# Patient Record
Sex: Male | Born: 1945 | Race: White | Hispanic: No | Marital: Married | Smoking: Never smoker
Health system: Southern US, Community
[De-identification: ages and names within clinical notes are randomized; demographics above are authoritative.]

## PROBLEM LIST (undated history)

## (undated) DIAGNOSIS — C439 Malignant melanoma of skin, unspecified: Secondary | ICD-10-CM

## (undated) DIAGNOSIS — K5090 Crohn's disease, unspecified, without complications: Secondary | ICD-10-CM

## (undated) DIAGNOSIS — E039 Hypothyroidism, unspecified: Secondary | ICD-10-CM

## (undated) DIAGNOSIS — E034 Atrophy of thyroid (acquired): Secondary | ICD-10-CM

## (undated) DIAGNOSIS — M19041 Primary osteoarthritis, right hand: Secondary | ICD-10-CM

## (undated) DIAGNOSIS — Z7960 Long term (current) use of unspecified immunomodulators and immunosuppressants: Secondary | ICD-10-CM

## (undated) DIAGNOSIS — N419 Inflammatory disease of prostate, unspecified: Secondary | ICD-10-CM

## (undated) DIAGNOSIS — E1165 Type 2 diabetes mellitus with hyperglycemia: Secondary | ICD-10-CM

## (undated) DIAGNOSIS — E559 Vitamin D deficiency, unspecified: Secondary | ICD-10-CM

## (undated) DIAGNOSIS — D649 Anemia, unspecified: Secondary | ICD-10-CM

## (undated) DIAGNOSIS — M1990 Unspecified osteoarthritis, unspecified site: Secondary | ICD-10-CM

## (undated) DIAGNOSIS — Z79899 Other long term (current) drug therapy: Secondary | ICD-10-CM

## (undated) DIAGNOSIS — Z7902 Long term (current) use of antithrombotics/antiplatelets: Secondary | ICD-10-CM

## (undated) DIAGNOSIS — N529 Male erectile dysfunction, unspecified: Secondary | ICD-10-CM

## (undated) DIAGNOSIS — C801 Malignant (primary) neoplasm, unspecified: Secondary | ICD-10-CM

## (undated) DIAGNOSIS — K5100 Ulcerative (chronic) pancolitis without complications: Secondary | ICD-10-CM

## (undated) DIAGNOSIS — I509 Heart failure, unspecified: Secondary | ICD-10-CM

## (undated) DIAGNOSIS — E785 Hyperlipidemia, unspecified: Secondary | ICD-10-CM

## (undated) DIAGNOSIS — I219 Acute myocardial infarction, unspecified: Secondary | ICD-10-CM

## (undated) DIAGNOSIS — M19042 Primary osteoarthritis, left hand: Secondary | ICD-10-CM

## (undated) DIAGNOSIS — I471 Supraventricular tachycardia, unspecified: Secondary | ICD-10-CM

## (undated) DIAGNOSIS — I2510 Atherosclerotic heart disease of native coronary artery without angina pectoris: Secondary | ICD-10-CM

## (undated) DIAGNOSIS — I252 Old myocardial infarction: Secondary | ICD-10-CM

## (undated) DIAGNOSIS — K219 Gastro-esophageal reflux disease without esophagitis: Secondary | ICD-10-CM

## (undated) DIAGNOSIS — Z8619 Personal history of other infectious and parasitic diseases: Secondary | ICD-10-CM

## (undated) DIAGNOSIS — I5189 Other ill-defined heart diseases: Secondary | ICD-10-CM

## (undated) DIAGNOSIS — C4492 Squamous cell carcinoma of skin, unspecified: Secondary | ICD-10-CM

## (undated) DIAGNOSIS — Z955 Presence of coronary angioplasty implant and graft: Secondary | ICD-10-CM

## (undated) HISTORY — DX: Type 2 diabetes mellitus with hyperglycemia: E11.65

## (undated) HISTORY — DX: Anemia, unspecified: D64.9

## (undated) HISTORY — DX: Ulcerative (chronic) pancolitis without complications: K51.00

## (undated) HISTORY — PX: BACK SURGERY: SHX140

## (undated) HISTORY — PX: CARDIAC CATHETERIZATION: SHX172

## (undated) HISTORY — DX: Gastro-esophageal reflux disease without esophagitis: K21.9

## (undated) HISTORY — DX: Hyperlipidemia, unspecified: E78.5

## (undated) HISTORY — DX: Vitamin D deficiency, unspecified: E55.9

## (undated) HISTORY — DX: Old myocardial infarction: I25.2

## (undated) HISTORY — DX: Atherosclerotic heart disease of native coronary artery without angina pectoris: I25.10

## (undated) HISTORY — DX: Atrophy of thyroid (acquired): E03.4

## (undated) HISTORY — PX: CORONARY STENT PLACEMENT: SHX1402

## (undated) HISTORY — PX: FRACTURE SURGERY: SHX138

## (undated) HISTORY — DX: Personal history of other infectious and parasitic diseases: Z86.19

## (undated) HISTORY — PX: OTHER SURGICAL HISTORY: SHX169

## (undated) HISTORY — PX: CHOLECYSTECTOMY: SHX55

---

## 2006-07-20 ENCOUNTER — Ambulatory Visit: Payer: Self-pay | Admitting: Unknown Physician Specialty

## 2006-07-20 HISTORY — PX: COLONOSCOPY: SHX174

## 2006-08-02 ENCOUNTER — Ambulatory Visit: Payer: Self-pay | Admitting: Unknown Physician Specialty

## 2006-08-03 ENCOUNTER — Other Ambulatory Visit: Payer: Self-pay

## 2006-08-03 ENCOUNTER — Emergency Department: Payer: Self-pay | Admitting: Emergency Medicine

## 2006-08-03 DIAGNOSIS — I255 Ischemic cardiomyopathy: Secondary | ICD-10-CM

## 2006-08-03 DIAGNOSIS — I2510 Atherosclerotic heart disease of native coronary artery without angina pectoris: Secondary | ICD-10-CM

## 2006-08-03 DIAGNOSIS — I2109 ST elevation (STEMI) myocardial infarction involving other coronary artery of anterior wall: Secondary | ICD-10-CM

## 2006-08-03 DIAGNOSIS — G5600 Carpal tunnel syndrome, unspecified upper limb: Secondary | ICD-10-CM

## 2006-08-03 HISTORY — DX: Ischemic cardiomyopathy: I25.5

## 2006-08-03 HISTORY — DX: ST elevation (STEMI) myocardial infarction involving other coronary artery of anterior wall: I21.09

## 2006-08-03 HISTORY — DX: Atherosclerotic heart disease of native coronary artery without angina pectoris: I25.10

## 2006-09-11 DIAGNOSIS — I214 Non-ST elevation (NSTEMI) myocardial infarction: Secondary | ICD-10-CM

## 2006-09-11 HISTORY — DX: Non-ST elevation (NSTEMI) myocardial infarction: I21.4

## 2006-09-13 DIAGNOSIS — I513 Intracardiac thrombosis, not elsewhere classified: Secondary | ICD-10-CM

## 2006-09-13 HISTORY — DX: Intracardiac thrombosis, not elsewhere classified: I51.3

## 2007-06-29 ENCOUNTER — Encounter: Payer: Self-pay | Admitting: Cardiovascular Disease

## 2007-07-13 ENCOUNTER — Encounter: Payer: Self-pay | Admitting: Cardiovascular Disease

## 2007-08-13 ENCOUNTER — Encounter: Payer: Self-pay | Admitting: Cardiovascular Disease

## 2007-09-12 ENCOUNTER — Encounter: Payer: Self-pay | Admitting: Cardiovascular Disease

## 2007-10-13 ENCOUNTER — Encounter: Payer: Self-pay | Admitting: Cardiovascular Disease

## 2007-11-13 ENCOUNTER — Encounter: Payer: Self-pay | Admitting: Cardiovascular Disease

## 2011-07-08 DIAGNOSIS — A0472 Enterocolitis due to Clostridium difficile, not specified as recurrent: Secondary | ICD-10-CM

## 2011-07-08 DIAGNOSIS — I2510 Atherosclerotic heart disease of native coronary artery without angina pectoris: Secondary | ICD-10-CM

## 2011-07-08 DIAGNOSIS — E118 Type 2 diabetes mellitus with unspecified complications: Secondary | ICD-10-CM | POA: Insufficient documentation

## 2011-07-08 DIAGNOSIS — K219 Gastro-esophageal reflux disease without esophagitis: Secondary | ICD-10-CM | POA: Insufficient documentation

## 2011-07-08 DIAGNOSIS — I236 Thrombosis of atrium, auricular appendage, and ventricle as current complications following acute myocardial infarction: Secondary | ICD-10-CM | POA: Insufficient documentation

## 2011-07-08 DIAGNOSIS — E785 Hyperlipidemia, unspecified: Secondary | ICD-10-CM

## 2011-07-08 DIAGNOSIS — I513 Intracardiac thrombosis, not elsewhere classified: Secondary | ICD-10-CM | POA: Insufficient documentation

## 2011-07-08 DIAGNOSIS — E1165 Type 2 diabetes mellitus with hyperglycemia: Secondary | ICD-10-CM | POA: Insufficient documentation

## 2011-07-08 DIAGNOSIS — E119 Type 2 diabetes mellitus without complications: Secondary | ICD-10-CM

## 2011-07-08 DIAGNOSIS — IMO0002 Reserved for concepts with insufficient information to code with codable children: Secondary | ICD-10-CM

## 2011-07-08 HISTORY — DX: Gastro-esophageal reflux disease without esophagitis: K21.9

## 2011-07-08 HISTORY — DX: Hyperlipidemia, unspecified: E78.5

## 2011-07-08 HISTORY — DX: Enterocolitis due to Clostridium difficile, not specified as recurrent: A04.72

## 2011-07-08 HISTORY — DX: Type 2 diabetes mellitus with hyperglycemia: E11.65

## 2011-07-08 HISTORY — DX: Atherosclerotic heart disease of native coronary artery without angina pectoris: I25.10

## 2011-07-08 HISTORY — DX: Reserved for concepts with insufficient information to code with codable children: IMO0002

## 2011-07-08 HISTORY — DX: Type 2 diabetes mellitus without complications: E11.9

## 2011-07-30 DIAGNOSIS — E785 Hyperlipidemia, unspecified: Secondary | ICD-10-CM

## 2011-07-30 HISTORY — DX: Hyperlipidemia, unspecified: E78.5

## 2011-10-07 ENCOUNTER — Inpatient Hospital Stay: Payer: Self-pay | Admitting: Surgery

## 2015-02-03 NOTE — Discharge Summary (Signed)
PATIENT NAME:  David [name], ALLSBROOK MR#:  [id number] DATE OF BIRTH:  06-18-1946  DATE OF ADMISSION:  10/07/2011 DATE OF DISCHARGE:  10/11/2011  BRIEF HISTORY: Mr. Stepfon Rawles is a 69 year old gentleman admitted through the Emergency Room with signs and symptoms consistent with acute cholecystitis. He had been sick for approximately 48 hours at the time of admission. Ultrasound performed at the time of his Emergency Room evaluation and noted to have thickened gallbladder wall, mildly elevated white blood cell count and bilirubin. His symptoms were consistent with acute cholecystitis. He was taken to surgery on the afternoon of 10/07/2011 and underwent a laparoscopic appendectomy. The procedure was uncomplicated. He did have significant acalculous cholecystitis. A drain was placed because of the significantly inflamed gallbladder. He had very slow return of normal bowel function. He was able to tolerate a liquid diet by the 29th and advanced to a soft diet by the 30th. His drain was removed at that time. He was discharged home on the 30th to be followed in the office in 7 to 10 days' time. Bathing, activity and driving instructions were given to the patient.   DISCHARGE MEDICATIONS: He is to resume his home medications to include:  1. Glyburide 5 mg 2 tablets b.i.d.  2. Omeprazole 20 mg p.o. daily.  3. Carvedilol 3.125 mg b.i.d. 4. Ramipril 5 mg once a day.  5. Simvastatin 40 mg once a day.  6. Mercaptopurine 50 mg once a day. 7. Aspirin 81 mg once a day. 8. Centrum Silver daily.  9. Vitamin D.  10. He is to take Vicodin for pain.   FINAL DISCHARGE DIAGNOSIS: Acute acalculous cholecystitis. ____________________________ Micheline Maze, MD rle:cms D: 10/16/2011 20:34:10 ET T: 10/19/2011 10:50:44 ET  JOB#: [id number] cc: Micheline Maze, MD, <Dictator> Dianah Field. Mable Fill, MD Rodena Goldmann MD ELECTRONICALLY SIGNED 10/19/2011 20:58

## 2015-02-03 NOTE — Consult Note (Signed)
PATIENT NAME:  David [name], CHAPUT MR#:  [id number] DATE OF BIRTH:  [birth year]  DATE OF CONSULTATION:  10/07/2011  REFERRING PHYSICIAN:  Dia Crawford, MD CONSULTING PHYSICIAN:  Carroll Lingelbach H. Posey Pronto, MD  PRIMARY CARE PHYSICIAN: Andrey Farmer, MD  REASON FOR CONSULTATION: Opinion regarding the patient's coronary artery disease, hypertension, diabetes, hyperlipidemia, and GERD.   HISTORY OF PRESENT ILLNESS: The patient is a 69 year old white male who was hospitalized earlier today by surgery. The patient woke up with abdominal pain in the right upper quadrant which began 48 hours ago. Due to these symptoms, the patient came to the ED. In the ED he was noted to have acute cholecystitis. Earlier today he underwent a laparoscopic cholecystectomy. The patient currently reports that he has diffuse abdominal pain, but is not having any chest pain or shortness of breath. He is not currently nauseated or having any emesis. He otherwise denies any urinary symptoms and no lower extremity swelling.   PAST MEDICAL HISTORY:  1. History of coronary artery disease with myocardial infarction.  2. Crohn's disease.  3. Hyperlipidemia.  4. Diabetes type 2.  5. Gastroesophageal reflux disease.  6. Hypertension.   ALLERGIES: Ampicillin.   SOCIAL HISTORY: He denies alcohol or smoking, no drugs.   FAMILY HISTORY: Positive for hypertension.   REVIEW OF SYSTEMS: CONSTITUTIONAL: He denies any fevers or chills. No significant weight loss or weight gain. He complains of abdominal pain. EYES: He denies any visual difficulties. No red redness in his eyes. No cataracts. No glaucoma. No painful eyes. NOSE: He denies any epistaxis, no nasal congestion, and no seasonal allergies. OROPHARYNX: He denies any difficulty swallowing, no ulceration in the mouth. CARDIOVASCULAR: He denies any chest pain. He does have history of coronary artery disease and hypertension. No syncope and no arrhythmias. PULMONARY: He denies any cough, wheezing,  asthma, or chronic obstructive pulmonary disease. No pneumonia. No hemoptysis. GASTROINTESTINAL: Has history of Crohn's disease. No history of hepatitis. No hematemesis. No hematochezia. GU: He denies any urinary frequency or hesitancy. NEURO: He denies any cerebrovascular accident, transient ischemic attack, or seizures. PSYCHIATRIC: He denies any anxiety, depression, bipolar, or schizophrenia. VASCULAR: He denies any claudication symptoms. ENDOCRINE: He denies any polyuria or nocturia, no heat or cold intolerance. SKIN: He denies any rash, changes in mole, hair or any other lesions. HEME: He denies any easy bruisability or bleeding.   PHYSICAL EXAMINATION:   VITAL SIGNS: Temperature 97.8, pulse 95, respiratory rate 20, blood pressure 117/71, and saturation 95% on room air.   GENERAL: The patient is a well-developed, well-nourished white male in no acute distress.   HEENT: Head atraumatic, normocephalic. Pupils are equal, round, and reactive to light and accommodation. Extraocular movements are intact. Oropharynx is clear without any exudates. No erythema or swelling of the ears. Nasal examination shows no ulceration or drainage.   NECK: No thyromegaly. No carotid bruits.   HEART: Regular rate and rhythm. No murmurs, rubs, clicks, or gallops. PMI is not displaced.   LUNGS: Clear to auscultation bilaterally without any rales, rhonchi, or wheezing.   ABDOMEN: Currently postoperative with abdominal pad in place.   EXTREMITIES: No clubbing, cyanosis, or edema.   NEUROLOGIC: Awake, alert, and oriented x3. No focal deficits.   SKIN: There is no rash.   VASCULAR: Good DP and PT pulses.  NEURO: Awake, alert, and oriented x3. No focal deficits.   PSYCHIATRIC: Not anxious or depressed.   LYMPHATICS: No lymph nodes are palpable.   ASSESSMENT AND PLAN: The patient is a 69 year old  status post laparoscopic cholecystectomy.  1. Coronary artery disease: Continue Coreg as taking at home. Resume  aspirin once okayed per Surgery. Monitor for any cardiac symptoms.  2. Diabetes type 2: Continue sliding scale and hold glyburide for now due to lack of p.o. intake. 3. Hypertension: We will hold Ramipril for the time being. Blood pressure is currently normal.  4. Gastroesophageal reflux disease: We will place the patient on Protonix IV daily.  5. Hyperlipidemia: Hold simvastatin for the time being until taking adequate p.o.  6. Continue heparin as prescribed for deep vein thrombosis prophylaxis and incentive spirometry.  TIME SPENT: 35 minutes. ____________________________ Lafonda Mosses Posey Pronto, MD shp:slb D: 10/07/2011 20:43:12 ET T: 10/08/2011 09:44:15 ET JOB#: [id number]  cc: Eleanore Junio H. Posey Pronto, MD, <Dictator> Dianah Field. Mable Fill, MD  Alric Seton MD ELECTRONICALLY SIGNED 10/17/2011 15:19

## 2015-02-15 DIAGNOSIS — E034 Atrophy of thyroid (acquired): Secondary | ICD-10-CM

## 2015-02-15 HISTORY — DX: Atrophy of thyroid (acquired): E03.4

## 2015-03-07 DIAGNOSIS — K5100 Ulcerative (chronic) pancolitis without complications: Secondary | ICD-10-CM | POA: Insufficient documentation

## 2015-03-07 HISTORY — DX: Ulcerative (chronic) pancolitis without complications: K51.00

## 2016-09-21 ENCOUNTER — Other Ambulatory Visit: Payer: Self-pay

## 2016-09-22 ENCOUNTER — Other Ambulatory Visit: Payer: Self-pay

## 2016-09-22 ENCOUNTER — Encounter: Payer: Self-pay | Admitting: Gastroenterology

## 2016-09-22 ENCOUNTER — Ambulatory Visit (INDEPENDENT_AMBULATORY_CARE_PROVIDER_SITE_OTHER): Payer: Medicare Other | Admitting: Gastroenterology

## 2016-09-22 ENCOUNTER — Other Ambulatory Visit
Admission: RE | Admit: 2016-09-22 | Discharge: 2016-09-22 | Disposition: A | Discharge status: Home / Self Care | Payer: Medicare Other | Source: Ambulatory Visit | Attending: Gastroenterology | Admitting: Gastroenterology

## 2016-09-22 VITALS — BP 109/65 | HR 86 | Temp 97.8°F | Ht 72.0 in | Wt 188.6 lb

## 2016-09-22 DIAGNOSIS — K51011 Ulcerative (chronic) pancolitis with rectal bleeding: Secondary | ICD-10-CM

## 2016-09-22 LAB — BASIC METABOLIC PANEL
Anion gap: 8 (ref 5–15)
BUN: 21 mg/dL — ABNORMAL HIGH (ref 6–20)
CHLORIDE: 101 mmol/L (ref 101–111)
CO2: 28 mmol/L (ref 22–32)
Calcium: 9.4 mg/dL (ref 8.9–10.3)
Creatinine, Ser: 1.17 mg/dL (ref 0.61–1.24)
GFR calc non Af Amer: >60 mL/min (ref >60)
Glucose, Bld: 193 mg/dL — ABNORMAL HIGH (ref 65–99)
POTASSIUM: 4.2 mmol/L (ref 3.5–5.1)
SODIUM: 137 mmol/L (ref 135–145)

## 2016-09-22 LAB — CBC WITH DIFFERENTIAL/PLATELET
BASOS ABS: 0.1 10*3/uL (ref 0–0.1)
BASOS PCT: 1 %
Eosinophils Absolute: 0.2 10*3/uL (ref 0–0.7)
Eosinophils Relative: 2 %
HEMATOCRIT: 39.8 % — AB (ref 40.0–52.0)
HEMOGLOBIN: 13.9 g/dL (ref 13.0–18.0)
Lymphocytes Relative: 18 %
Lymphs Abs: 2 10*3/uL (ref 1.0–3.6)
MCH: 31.1 pg (ref 26.0–34.0)
MCHC: 34.9 g/dL (ref 32.0–36.0)
MCV: 89.2 fL (ref 80.0–100.0)
Monocytes Absolute: 1.4 10*3/uL — ABNORMAL HIGH (ref 0.2–1.0)
Monocytes Relative: 13 %
NEUTROS ABS: 7.4 10*3/uL — AB (ref 1.4–6.5)
NEUTROS PCT: 66 %
Platelets: 233 10*3/uL (ref 150–440)
RBC: 4.47 MIL/uL (ref 4.40–5.90)
RDW: 14.1 % (ref 11.5–14.5)
WBC: 11 10*3/uL — ABNORMAL HIGH (ref 3.8–10.6)

## 2016-09-22 LAB — C-REACTIVE PROTEIN: CRP: 1 mg/dL — ABNORMAL HIGH (ref <1.0)

## 2016-09-22 MED ORDER — MESALAMINE 4 G RE ENEM: 4.0000 g | ENEMA | Freq: Every day | RECTAL | 1 refills | Status: DC

## 2016-09-22 NOTE — Progress Notes (Signed)
Gastroenterology Consultation  Referring Provider:     No ref. provider found Primary Care Physician:  Madelyn Brunner, MD Primary Gastroenterologist:  Dr. [name]  Reason for Consultation:     Colitis flare        HPI:   David [name] is a 70 y.o. y/o male referred for consultation & management  by Dr. Sarina Ser, Hewitt Blade, MD.    He is here today to see me for a "flare up of his colitis". Per old notes from 2011, it ststaes that he has had ulcerative colitis from 2007 . 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 Remicaid d/c in 2009 due to the malignant skin lesions. Since 2009  Not been on any medications. He says that since then has had a few courses of prednisone. Since 2009  Was being followed by [place] till 2015 when his doctor retired. Hospitalized in 2011 for flare of the colitis.   Labs 08/2016 Hb 12.7 ,MCV 88, Cr 1.1  Presently he says that a week back , started having diarrhea, brown liquid , with pink  Mucus, some abdominal discomfort . No NSAID's . Per day is having 10-12 bowel movements a day , he is having some urgency . Has been going to the rest room in the middle of the night.     Past Medical History:  Diagnosis Date  . Anemia   . CAD (coronary artery disease) 07/08/2011   Overview:  a. 07/2006: Anterior ST elevation MI. b. 07/2006: PCI with BMS to LAD and RCA. c. 09/2006: Non ST elevation. d. LVEF 30%.  Discussed ICD, not currently interested.   . 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 Dyslipidemia 07/30/2011   Overview:  High triglycerides  . GERD (gastroesophageal reflux disease) 07/08/2011  . History of Clostridium difficile colitis   . History of myocardial infarction   . Hyperlipidemia, unspecified 07/08/2011  . Hypothyroidism due to acquired atrophy  of thyroid 02/15/2015  . Type II diabetes mellitus, uncontrolled ([place]) 07/08/2011  . Vitamin D deficiency     Past Surgical History:  Procedure Laterality Date  . CARDIAC CATHETERIZATION    . COLONOSCOPY  07/20/2006   Crohn's disease  . CORONARY STENT PLACEMENT    . MELANOMA REMOVED      Prior to Admission medications   Medication Sig Start Date End Date Taking? Authorizing Provider  ACCU-CHEK AVIVA PLUS test strip USE 2 (TWO) TIMES DAILY. USE AS INSTRUCTED. 09/01/16  Yes Historical Provider, MD  ASPIRIN 81 PO Take by mouth. 06/25/08  Yes Historical Provider, MD  atorvastatin (LIPITOR) 40 MG tablet Take by mouth. 05/21/16  Yes Historical Provider, MD  carvedilol (COREG) 3.125 MG tablet Take by mouth. 05/21/16  Yes Historical Provider, MD  Cholecalciferol (VITAMIN D) 2000 units CAPS Take by mouth. 08/01/09  Yes Historical Provider, MD  glipiZIDE (GLUCOTROL) 10 MG tablet Take by mouth. 05/21/16  Yes Historical Provider, MD  levothyroxine (SYNTHROID, LEVOTHROID) 50 MCG tablet Take by mouth. 05/21/16  Yes Historical Provider, MD  Multiple Vitamins-Minerals (MULTIVITAMIN ADULT PO) Take by mouth. 06/25/08  Yes Historical Provider, MD  Omega-3 Fatty Acids (FISH OIL PO) Take by mouth. 06/25/08  Yes Historical Provider, MD  omeprazole (PRILOSEC) 20 MG capsule Take by mouth. 05/21/16  Yes  Historical Provider, MD  ramipril (ALTACE) 5 MG capsule Take by mouth. 05/21/16  Yes Historical Provider, MD    Family History  Problem Relation Age of Onset  . Heart disease Mother   . Heart attack Father   . Heart disease Sister   . Heart disease Brother      Social History  Substance Use Topics  . Smoking status: Never Smoker  . Smokeless tobacco: Never Used  . Alcohol use No    Allergies as of 09/22/2016 - Review Complete 09/22/2016  Allergen Reaction Noted  . Codeine Other (See Comments)     Review of Systems:    All systems reviewed and negative except where noted in HPI.   Physical Exam:  BP  109/65   Pulse 86   Temp 97.8 F (36.6 C) (Oral)   Ht 6' (1.829 m)   Wt 188 lb 9.6 oz (85.5 kg)   BMI 25.58 kg/m  No LMP for male patient. Psych:  Alert and cooperative. Normal mood and affect. General:   Alert,  Well-developed, well-nourished, pleasant and cooperative in NAD Head:  Normocephalic and atraumatic. Eyes:  Sclera clear, no icterus.   Conjunctiva pink. Ears:  Normal auditory acuity. Nose:  No deformity, discharge, or lesions. Mouth:  No deformity or lesions,oropharynx pink & moist. Neck:  Supple; no masses or thyromegaly. Lungs:  Respirations even and unlabored.  Clear throughout to auscultation.   No wheezes, crackles, or rhonchi. No acute distress. Heart:  Regular rate and rhythm; no murmurs, clicks, rubs, or gallops. Abdomen:  Normal bowel sounds.  No bruits.  Soft, non-tender and non-distended without masses, hepatosplenomegaly or hernias noted.  No guarding or rebound tenderness.     Lymph Nodes:  No significant cervical adenopathy. Psych:  Alert and cooperative. Normal mood and affect.  Imaging Studies: No results found.  Assessment and Plan:   David [name] is a 70 y.o. y/o male has been referred for flare of colitis. Appears he has had ulcerative pan colitis since 2007, Did well on Remicaid till 2009 which was stopped due to cancerous lesions on his face. Since then not really been on any medications except for steroids on and off. I do not have an endoscopy which confirms that he ever did attain mucosal healing. Presently his symptoms suggest a moderate colitis.  Plan  1. Baseline labs, stool tests 2. Flexible sigmoidoscopy to determine severity of colitis for baseline 3. Commence on Rowasa today and will add oral ASA after sigmoidoscopy in 2-3 days time.  4. I did explain need to keep mucosal inflammation under control and its association with colorectal cancer especially after 10 years of colitis.   Follow up in 2 weeks   Dr [name] MD

## 2016-09-23 LAB — GASTROINTESTINAL PANEL BY PCR, STOOL (REPLACES STOOL CULTURE)
ADENOVIRUS F40/41: NOT DETECTED
ADENOVIRUS F40/41: NOT DETECTED
ASTROVIRUS: NOT DETECTED
Astrovirus: NOT DETECTED
CAMPYLOBACTER SPECIES: NOT DETECTED
CRYPTOSPORIDIUM: NOT DETECTED
CYCLOSPORA CAYETANENSIS: NOT DETECTED
CYCLOSPORA CAYETANENSIS: NOT DETECTED
Campylobacter species: NOT DETECTED
Cryptosporidium: NOT DETECTED
ENTAMOEBA HISTOLYTICA: NOT DETECTED
ENTEROAGGREGATIVE E COLI (EAEC): NOT DETECTED
ENTEROAGGREGATIVE E COLI (EAEC): NOT DETECTED
ENTEROPATHOGENIC E COLI (EPEC): NOT DETECTED
ENTEROPATHOGENIC E COLI (EPEC): NOT DETECTED
ENTEROTOXIGENIC E COLI (ETEC): NOT DETECTED
Entamoeba histolytica: NOT DETECTED
Enterotoxigenic E coli (ETEC): NOT DETECTED
GIARDIA LAMBLIA: NOT DETECTED
Giardia lamblia: NOT DETECTED
Norovirus GI/GII: NOT DETECTED
Norovirus GI/GII: NOT DETECTED
PLESIMONAS SHIGELLOIDES: NOT DETECTED
Plesimonas shigelloides: NOT DETECTED
ROTAVIRUS A: NOT DETECTED
ROTAVIRUS A: NOT DETECTED
Salmonella species: NOT DETECTED
Salmonella species: NOT DETECTED
Sapovirus (I, II, IV, and V): NOT DETECTED
Sapovirus (I, II, IV, and V): NOT DETECTED
Shiga like toxin producing E coli (STEC): NOT DETECTED
Shiga like toxin producing E coli (STEC): NOT DETECTED
Shigella/Enteroinvasive E coli (EIEC): NOT DETECTED
Shigella/Enteroinvasive E coli (EIEC): NOT DETECTED
VIBRIO CHOLERAE: NOT DETECTED
VIBRIO SPECIES: NOT DETECTED
VIBRIO SPECIES: NOT DETECTED
Vibrio cholerae: NOT DETECTED
YERSINIA ENTEROCOLITICA: NOT DETECTED
Yersinia enterocolitica: NOT DETECTED

## 2016-09-23 LAB — C DIFFICILE QUICK SCREEN W PCR REFLEX
C DIFFICILE (CDIFF) INTERP: NOT DETECTED
C Diff antigen: NEGATIVE
C Diff toxin: NEGATIVE

## 2016-09-25 ENCOUNTER — Ambulatory Visit (HOSPITAL_BASED_OUTPATIENT_CLINIC_OR_DEPARTMENT_OTHER)
Admission: RE | Admit: 2016-09-25 | Discharge: 2016-09-25 | Disposition: A | Discharge status: Home / Self Care | Payer: Medicare Other | Source: Ambulatory Visit | Attending: Gastroenterology | Admitting: Gastroenterology

## 2016-09-25 ENCOUNTER — Encounter
Admission: RE | Disposition: A | Discharge status: Home / Self Care | Payer: Self-pay | Source: Ambulatory Visit | Attending: Gastroenterology

## 2016-09-25 ENCOUNTER — Encounter: Payer: Self-pay | Admitting: *Deleted

## 2016-09-25 DIAGNOSIS — Z6825 Body mass index (BMI) 25.0-25.9, adult: Secondary | ICD-10-CM

## 2016-09-25 DIAGNOSIS — Z9889 Other specified postprocedural states: Secondary | ICD-10-CM

## 2016-09-25 DIAGNOSIS — N179 Acute kidney failure, unspecified: Secondary | ICD-10-CM | POA: Diagnosis present

## 2016-09-25 DIAGNOSIS — Z955 Presence of coronary angioplasty implant and graft: Secondary | ICD-10-CM

## 2016-09-25 DIAGNOSIS — E559 Vitamin D deficiency, unspecified: Secondary | ICD-10-CM | POA: Insufficient documentation

## 2016-09-25 DIAGNOSIS — Z7984 Long term (current) use of oral hypoglycemic drugs: Secondary | ICD-10-CM

## 2016-09-25 DIAGNOSIS — K219 Gastro-esophageal reflux disease without esophagitis: Secondary | ICD-10-CM | POA: Insufficient documentation

## 2016-09-25 DIAGNOSIS — K51011 Ulcerative (chronic) pancolitis with rectal bleeding: Principal | ICD-10-CM | POA: Diagnosis present

## 2016-09-25 DIAGNOSIS — K529 Noninfective gastroenteritis and colitis, unspecified: Secondary | ICD-10-CM

## 2016-09-25 DIAGNOSIS — K5289 Other specified noninfective gastroenteritis and colitis: Secondary | ICD-10-CM | POA: Insufficient documentation

## 2016-09-25 DIAGNOSIS — K633 Ulcer of intestine: Secondary | ICD-10-CM

## 2016-09-25 DIAGNOSIS — E119 Type 2 diabetes mellitus without complications: Secondary | ICD-10-CM

## 2016-09-25 DIAGNOSIS — Z79899 Other long term (current) drug therapy: Secondary | ICD-10-CM | POA: Insufficient documentation

## 2016-09-25 DIAGNOSIS — E43 Unspecified severe protein-calorie malnutrition: Secondary | ICD-10-CM | POA: Diagnosis present

## 2016-09-25 DIAGNOSIS — Z7982 Long term (current) use of aspirin: Secondary | ICD-10-CM

## 2016-09-25 DIAGNOSIS — I10 Essential (primary) hypertension: Secondary | ICD-10-CM | POA: Diagnosis present

## 2016-09-25 DIAGNOSIS — I2510 Atherosclerotic heart disease of native coronary artery without angina pectoris: Secondary | ICD-10-CM | POA: Insufficient documentation

## 2016-09-25 DIAGNOSIS — Z85820 Personal history of malignant melanoma of skin: Secondary | ICD-10-CM

## 2016-09-25 DIAGNOSIS — I252 Old myocardial infarction: Secondary | ICD-10-CM | POA: Insufficient documentation

## 2016-09-25 DIAGNOSIS — E039 Hypothyroidism, unspecified: Secondary | ICD-10-CM | POA: Diagnosis present

## 2016-09-25 DIAGNOSIS — E781 Pure hyperglyceridemia: Secondary | ICD-10-CM | POA: Diagnosis present

## 2016-09-25 DIAGNOSIS — K51911 Ulcerative colitis, unspecified with rectal bleeding: Secondary | ICD-10-CM

## 2016-09-25 DIAGNOSIS — K5090 Crohn's disease, unspecified, without complications: Secondary | ICD-10-CM | POA: Insufficient documentation

## 2016-09-25 DIAGNOSIS — K6389 Other specified diseases of intestine: Secondary | ICD-10-CM | POA: Diagnosis not present

## 2016-09-25 DIAGNOSIS — Z8619 Personal history of other infectious and parasitic diseases: Secondary | ICD-10-CM | POA: Insufficient documentation

## 2016-09-25 DIAGNOSIS — R197 Diarrhea, unspecified: Secondary | ICD-10-CM | POA: Diagnosis not present

## 2016-09-25 DIAGNOSIS — K5190 Ulcerative colitis, unspecified, without complications: Secondary | ICD-10-CM | POA: Insufficient documentation

## 2016-09-25 DIAGNOSIS — T380X5A Adverse effect of glucocorticoids and synthetic analogues, initial encounter: Secondary | ICD-10-CM | POA: Diagnosis present

## 2016-09-25 DIAGNOSIS — K6289 Other specified diseases of anus and rectum: Secondary | ICD-10-CM | POA: Diagnosis not present

## 2016-09-25 DIAGNOSIS — Z885 Allergy status to narcotic agent status: Secondary | ICD-10-CM

## 2016-09-25 HISTORY — PX: FLEXIBLE SIGMOIDOSCOPY: SHX5431

## 2016-09-25 HISTORY — DX: Hypothyroidism, unspecified: E03.9

## 2016-09-25 LAB — GLUCOSE, CAPILLARY: Glucose-Capillary: 146 mg/dL — ABNORMAL HIGH (ref 65–99)

## 2016-09-25 SURGERY — SIGMOIDOSCOPY, FLEXIBLE
Anesthesia: General

## 2016-09-25 MED ORDER — NALOXONE HCL 2 MG/2ML IJ SOSY
PREFILLED_SYRINGE | INTRAMUSCULAR | Status: AC
  Filled 2016-09-25: qty 2

## 2016-09-25 MED ORDER — FENTANYL CITRATE (PF) 100 MCG/2ML IJ SOLN
INTRAMUSCULAR | Status: AC
  Filled 2016-09-25: qty 4

## 2016-09-25 MED ORDER — MIDAZOLAM HCL 5 MG/5ML IJ SOLN
INTRAMUSCULAR | Status: DC | PRN
  Administered 2016-09-25: 1 mg via INTRAVENOUS
  Administered 2016-09-25: 2 mg via INTRAVENOUS

## 2016-09-25 MED ORDER — MIDAZOLAM HCL 5 MG/5ML IJ SOLN
INTRAMUSCULAR | Status: AC
  Filled 2016-09-25: qty 5

## 2016-09-25 MED ORDER — SODIUM CHLORIDE 0.9 % IV SOLN
INTRAVENOUS | Status: DC
  Administered 2016-09-25: 1000 mL via INTRAVENOUS

## 2016-09-25 MED ORDER — FENTANYL CITRATE (PF) 100 MCG/2ML IJ SOLN
INTRAMUSCULAR | Status: DC | PRN
  Administered 2016-09-25: 50 ug via INTRAVENOUS

## 2016-09-25 MED ORDER — MESALAMINE ER 500 MG PO CPCR: 1000.0000 mg | ORAL_CAPSULE | Freq: Four times a day (QID) | ORAL | 1 refills | Status: DC

## 2016-09-25 NOTE — H&P (Signed)
[name] MD [address], [place], [place] [address] Phone: [phone] Fax : [phone]  Primary Care Physician:  Madelyn Brunner, MD Primary Gastroenterologist:  Dr. [name]   Pre-Procedure History & Physical: HPI:  David [name] is a 70 y.o. male is here for an flexible sigmoidoscopy.   Past Medical History:  Diagnosis Date  . Anemia   . CAD (coronary artery disease) 07/08/2011   Overview:  a. 07/2006: Anterior ST elevation MI. b. 07/2006: PCI with BMS to LAD and RCA. c. 09/2006: Non ST elevation. d. LVEF 30%.  Discussed ICD, not currently interested.   . 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 Dyslipidemia 07/30/2011   Overview:  High triglycerides  . GERD (gastroesophageal reflux disease) 07/08/2011  . History of Clostridium difficile colitis   . History of myocardial infarction   . Hyperlipidemia, unspecified 07/08/2011  . Hypothyroidism   . 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 Type II diabetes mellitus, uncontrolled ([place]) 07/08/2011  . Vitamin D deficiency     Past Surgical History:  Procedure Laterality Date  . CARDIAC CATHETERIZATION    . CHOLECYSTECTOMY    . COLONOSCOPY  07/20/2006   Crohn's disease  . CORONARY STENT PLACEMENT    . MELANOMA REMOVED    . parotid gland removal      Prior to Admission medications   Medication Sig Start Date End Date Taking? Authorizing Provider  carvedilol (COREG) 3.125 MG tablet Take by mouth. 05/21/16  Yes Historical Provider, MD  ACCU-CHEK AVIVA PLUS test strip USE 2 (TWO) TIMES DAILY. USE AS INSTRUCTED. 09/01/16   Historical Provider, MD  ASPIRIN 81 PO Take by mouth. 06/25/08   Historical Provider, MD  atorvastatin (LIPITOR) 40 MG tablet Take by mouth. 05/21/16   Historical Provider, MD  Cholecalciferol (VITAMIN D) 2000 units CAPS Take by mouth. 08/01/09   Historical Provider, MD  glipiZIDE (GLUCOTROL) 10 MG tablet Take by mouth. 05/21/16   Historical  Provider, MD  levothyroxine (SYNTHROID, LEVOTHROID) 50 MCG tablet Take by mouth. 05/21/16   Historical Provider, MD  mesalamine (ROWASA) 4 g enema Place 60 mLs (4 g total) rectally at bedtime. 09/22/16 10/23/16  [name], MD  Multiple Vitamins-Minerals (MULTIVITAMIN ADULT PO) Take by mouth. 06/25/08   Historical Provider, MD  Omega-3 Fatty Acids (FISH OIL PO) Take by mouth. 06/25/08   Historical Provider, MD  omeprazole (PRILOSEC) 20 MG capsule Take by mouth. 05/21/16   Historical Provider, MD  ramipril (ALTACE) 5 MG capsule Take by mouth. 05/21/16   Historical Provider, MD    Allergies as of 09/22/2016 - Review Complete 09/22/2016  Allergen Reaction Noted  . Codeine Other (See Comments)     Family History  Problem Relation Age of Onset  . Heart disease Mother   . Heart attack Father   . Heart disease Sister   . Heart disease Brother     Social History   Social History  . Marital status: Married    Spouse name: N/A  . Number of children: N/A  . Years of education: N/A   Occupational History  . Not on file.   Social History Main Topics  . Smoking status: Never Smoker  . Smokeless tobacco: Never Used  . Alcohol use No  . Drug use: No  . Sexual activity: Not on file   Other Topics Concern  . Not on file   Social History Narrative  . No narrative on file  Review of Systems: See HPI, otherwise negative ROS  Physical Exam: BP (!) 154/75   Pulse 75   Temp 97.3 F (36.3 C) (Tympanic)   Resp 18   Ht 6' (1.829 m)   Wt 185 lb (83.9 kg)   SpO2 100%   BMI 25.09 kg/m  General:   Alert,  pleasant and cooperative in NAD Head:  Normocephalic and atraumatic. Neck:  Supple; no masses or thyromegaly. Lungs:  Clear throughout to auscultation.    Heart:  Regular rate and rhythm. Abdomen:  Soft, nontender and nondistended. Normal bowel sounds, without guarding, and without rebound.   Neurologic:  Alert and  oriented x4;  grossly normal  neurologically.  Impression/Plan: David [name] is here for an flexible sigmoidoscopy to be performed for ulcerative colitis  Risks, benefits, limitations, and alternatives regarding  flexible sigmoidoscopy have been reviewed with the patient.  Questions have been answered.  All parties agreeable.   [name], MD  09/25/2016, 10:52 AM

## 2016-09-25 NOTE — Op Note (Signed)
[facility] Gastroenterology Patient Name: David [name] Procedure Date: 09/25/2016 10:57 AM MRN: [id number] Account #: [id number] Date of Birth: Sep 01, 1946 Admit Type: Outpatient Age: 70 Room: [facility] ENDO ROOM 1 Gender: Male Note Status: Finalized Procedure:            Flexible Sigmoidoscopy Indications:          Ulcerative colitis Providers:            [name] MD, MD Referring MD:         Hewitt Blade. Sarina Ser, MD (Referring MD) Medicines:            The level of sedation administered was moderate,                        Fentanyl 50 micrograms IV, Midazolam 3 mg IV Complications:        No immediate complications. Procedure:            Pre-Anesthesia Assessment:                       - ASA Grade Assessment: II - A patient with mild                        systemic disease.                       - Prior to the procedure, a History and Physical was                        performed, and patient medications, allergies and                        sensitivities were reviewed. The patient's tolerance of                        previous anesthesia was reviewed.                       - The risks and benefits of the procedure and the                        sedation options and risks were discussed with the                        patient. All questions were answered and informed                        consent was obtained.                       - The risks and benefits of the procedure and the                        sedation options and risks were discussed with the                        patient. All questions were answered and informed                        consent was obtained.  After obtaining informed consent, the scope was passed                        under direct vision. The Colonoscope was introduced                        through the anus The procedure was aborted. The scope                        was not inserted. descending colon  Medications were                        descending colon. The flexible sigmoidoscopy was                        accomplished with ease. The patient tolerated the                        procedure well. The quality of the bowel preparation                        was adequate. Findings:      The perianal and digital rectal examinations were normal.      An area of congested mucosa was found in the rectum, in the sigmoid       colon and in the descending colon. This was biopsied with a cold forceps       for histology.      A continuous area of nonbleeding ulcerated mucosa with no stigmata of       recent bleeding was present in the rectum. Biopsies were taken with a       cold forceps for histology.      The mucosa vascular pattern in the sigmoid colon was diffusely decreased.      did not proceed beyond descending colon, due to severe mucosal edema and       inflammation Impression:           - Congested mucosa in the rectum, in the sigmoid colon                        and in the descending colon. Biopsied.                       - Mucosal ulceration. Biopsied.                       - Decreased mucosa vascular pattern in the sigmoid                        colon. Recommendation:       - Discharge patient to home (with escort).                       - Resume previous diet.                       - Use Pentasa 1 gram PO QID for 6 weeks.                       - Return to my office in 2 weeks. Procedure Code(s):    --- Professional ---  46803, Sigmoidoscopy, flexible; with biopsy, single or                        multiple Diagnosis Code(s):    --- Professional ---                       K62.89, Other specified diseases of anus and rectum                       K63.89, Other specified diseases of intestine                       K63.3, Ulcer of intestine                       K51.90, Ulcerative colitis, unspecified, without                        complications CPT copyright  2016 American Medical Association. All rights reserved. The codes documented in this report are preliminary and upon coder review may  be revised to meet current compliance requirements. [name], MD [name] MD, MD 09/25/2016 11:19:52 AM This report has been signed electronically. Number of Addenda: 0 Note Initiated On: 09/25/2016 10:57 AM Total Procedure Duration: 0 hours 10 minutes 9 seconds       [facility]

## 2016-09-25 NOTE — Brief Op Note (Signed)
Proximal descending colon reached.

## 2016-09-26 ENCOUNTER — Encounter: Payer: Self-pay | Admitting: Gastroenterology

## 2016-09-28 ENCOUNTER — Inpatient Hospital Stay
Admission: EM | Admit: 2016-09-28 | Discharge: 2016-10-02 | DRG: 385 | Disposition: A | Discharge status: Home / Self Care | Payer: Medicare Other | Attending: Specialist | Admitting: Specialist

## 2016-09-28 ENCOUNTER — Telehealth: Payer: Self-pay | Admitting: Gastroenterology

## 2016-09-28 ENCOUNTER — Encounter: Payer: Self-pay | Admitting: Emergency Medicine

## 2016-09-28 ENCOUNTER — Inpatient Hospital Stay: Payer: Medicare Other

## 2016-09-28 DIAGNOSIS — T380X5A Adverse effect of glucocorticoids and synthetic analogues, initial encounter: Secondary | ICD-10-CM | POA: Diagnosis present

## 2016-09-28 DIAGNOSIS — R197 Diarrhea, unspecified: Secondary | ICD-10-CM | POA: Diagnosis present

## 2016-09-28 DIAGNOSIS — K51919 Ulcerative colitis, unspecified with unspecified complications: Secondary | ICD-10-CM

## 2016-09-28 DIAGNOSIS — K922 Gastrointestinal hemorrhage, unspecified: Secondary | ICD-10-CM

## 2016-09-28 DIAGNOSIS — N179 Acute kidney failure, unspecified: Secondary | ICD-10-CM | POA: Diagnosis present

## 2016-09-28 DIAGNOSIS — E119 Type 2 diabetes mellitus without complications: Secondary | ICD-10-CM | POA: Diagnosis present

## 2016-09-28 DIAGNOSIS — Z7982 Long term (current) use of aspirin: Secondary | ICD-10-CM | POA: Diagnosis not present

## 2016-09-28 DIAGNOSIS — R109 Unspecified abdominal pain: Secondary | ICD-10-CM

## 2016-09-28 DIAGNOSIS — Z79899 Other long term (current) drug therapy: Secondary | ICD-10-CM | POA: Diagnosis not present

## 2016-09-28 DIAGNOSIS — Z6825 Body mass index (BMI) 25.0-25.9, adult: Secondary | ICD-10-CM | POA: Diagnosis not present

## 2016-09-28 DIAGNOSIS — E43 Unspecified severe protein-calorie malnutrition: Secondary | ICD-10-CM | POA: Insufficient documentation

## 2016-09-28 DIAGNOSIS — E559 Vitamin D deficiency, unspecified: Secondary | ICD-10-CM | POA: Diagnosis present

## 2016-09-28 DIAGNOSIS — K219 Gastro-esophageal reflux disease without esophagitis: Secondary | ICD-10-CM | POA: Diagnosis present

## 2016-09-28 DIAGNOSIS — Z7984 Long term (current) use of oral hypoglycemic drugs: Secondary | ICD-10-CM | POA: Diagnosis not present

## 2016-09-28 DIAGNOSIS — K6389 Other specified diseases of intestine: Secondary | ICD-10-CM | POA: Diagnosis present

## 2016-09-28 DIAGNOSIS — K529 Noninfective gastroenteritis and colitis, unspecified: Secondary | ICD-10-CM | POA: Diagnosis present

## 2016-09-28 DIAGNOSIS — K51011 Ulcerative (chronic) pancolitis with rectal bleeding: Secondary | ICD-10-CM | POA: Diagnosis present

## 2016-09-28 DIAGNOSIS — I252 Old myocardial infarction: Secondary | ICD-10-CM | POA: Diagnosis not present

## 2016-09-28 DIAGNOSIS — I2510 Atherosclerotic heart disease of native coronary artery without angina pectoris: Secondary | ICD-10-CM | POA: Diagnosis present

## 2016-09-28 DIAGNOSIS — I10 Essential (primary) hypertension: Secondary | ICD-10-CM | POA: Diagnosis present

## 2016-09-28 DIAGNOSIS — E781 Pure hyperglyceridemia: Secondary | ICD-10-CM | POA: Diagnosis present

## 2016-09-28 DIAGNOSIS — E039 Hypothyroidism, unspecified: Secondary | ICD-10-CM | POA: Diagnosis present

## 2016-09-28 DIAGNOSIS — Z955 Presence of coronary angioplasty implant and graft: Secondary | ICD-10-CM | POA: Diagnosis not present

## 2016-09-28 DIAGNOSIS — Z885 Allergy status to narcotic agent status: Secondary | ICD-10-CM | POA: Diagnosis not present

## 2016-09-28 LAB — COMPREHENSIVE METABOLIC PANEL
ALK PHOS: 61 U/L (ref 38–126)
ALT: 16 U/L — AB (ref 17–63)
ANION GAP: 10 (ref 5–15)
AST: 19 U/L (ref 15–41)
Albumin: 2.9 g/dL — ABNORMAL LOW (ref 3.5–5.0)
BILIRUBIN TOTAL: 1.6 mg/dL — AB (ref 0.3–1.2)
BUN: 21 mg/dL — ABNORMAL HIGH (ref 6–20)
CALCIUM: 8 mg/dL — AB (ref 8.9–10.3)
CO2: 22 mmol/L (ref 22–32)
CREATININE: 1.42 mg/dL — AB (ref 0.61–1.24)
Chloride: 100 mmol/L — ABNORMAL LOW (ref 101–111)
GFR calc non Af Amer: 49 mL/min — ABNORMAL LOW (ref >60)
GFR, EST AFRICAN AMERICAN: 56 mL/min — AB (ref >60)
GLUCOSE: 223 mg/dL — AB (ref 65–99)
Potassium: 3.8 mmol/L (ref 3.5–5.1)
SODIUM: 132 mmol/L — AB (ref 135–145)
TOTAL PROTEIN: 6 g/dL — AB (ref 6.5–8.1)

## 2016-09-28 LAB — CBC WITH DIFFERENTIAL/PLATELET
Basophils Absolute: 0 10*3/uL (ref 0–0.1)
Basophils Relative: 0 %
Eosinophils Absolute: 0.1 10*3/uL (ref 0–0.7)
Eosinophils Relative: 2 %
HEMATOCRIT: 35.2 % — AB (ref 40.0–52.0)
HEMOGLOBIN: 12.6 g/dL — AB (ref 13.0–18.0)
LYMPHS ABS: 0.7 10*3/uL — AB (ref 1.0–3.6)
LYMPHS PCT: 8 %
MCH: 31.6 pg (ref 26.0–34.0)
MCHC: 35.9 g/dL (ref 32.0–36.0)
MCV: 88 fL (ref 80.0–100.0)
MONOS PCT: 14 %
Monocytes Absolute: 1.1 10*3/uL — ABNORMAL HIGH (ref 0.2–1.0)
NEUTROS ABS: 6.2 10*3/uL (ref 1.4–6.5)
Neutrophils Relative %: 76 %
Platelets: 246 10*3/uL (ref 150–440)
RBC: 3.99 MIL/uL — AB (ref 4.40–5.90)
RDW: 14 % (ref 11.5–14.5)
WBC: 8.1 10*3/uL (ref 3.8–10.6)

## 2016-09-28 LAB — GLUCOSE, CAPILLARY
GLUCOSE-CAPILLARY: 173 mg/dL — AB (ref 65–99)
Glucose-Capillary: 163 mg/dL — ABNORMAL HIGH (ref 65–99)

## 2016-09-28 LAB — TYPE AND SCREEN
ABO/RH(D): A POS
ANTIBODY SCREEN: NEGATIVE

## 2016-09-28 LAB — C DIFFICILE QUICK SCREEN W PCR REFLEX
C DIFFICILE (CDIFF) INTERP: NOT DETECTED
C DIFFICILE (CDIFF) TOXIN: NEGATIVE
C Diff antigen: NEGATIVE

## 2016-09-28 LAB — TROPONIN I: Troponin I: <0.03 ng/mL (ref <0.03)

## 2016-09-28 MED ORDER — SODIUM CHLORIDE 0.9 % IV SOLN: INTRAVENOUS | Status: DC

## 2016-09-28 MED ORDER — LOPERAMIDE HCL 2 MG PO CAPS
2.0000 mg | ORAL_CAPSULE | Freq: Four times a day (QID) | ORAL | Status: DC | PRN
Start: 2016-09-28 — End: 2016-10-02
  Administered 2016-09-28 – 2016-10-02 (×11): 2 mg via ORAL
  Filled 2016-09-28 (×10): qty 1

## 2016-09-28 MED ORDER — CARVEDILOL 6.25 MG PO TABS
3.1250 mg | ORAL_TABLET | Freq: Two times a day (BID) | ORAL | Status: DC
  Administered 2016-09-29 – 2016-10-02 (×7): 3.125 mg via ORAL
  Filled 2016-09-28 (×7): qty 1

## 2016-09-28 MED ORDER — PANTOPRAZOLE SODIUM 40 MG PO TBEC
40.0000 mg | DELAYED_RELEASE_TABLET | Freq: Every day | ORAL | Status: DC
  Administered 2016-09-28 – 2016-10-02 (×5): 40 mg via ORAL
  Filled 2016-09-28 (×4): qty 1

## 2016-09-28 MED ORDER — PANTOPRAZOLE SODIUM 40 MG PO TBEC
DELAYED_RELEASE_TABLET | ORAL | Status: AC
  Administered 2016-09-28: 40 mg via ORAL
  Filled 2016-09-28: qty 1

## 2016-09-28 MED ORDER — INSULIN ASPART 100 UNIT/ML ~~LOC~~ SOLN
0.0000 [IU] | Freq: Every day | SUBCUTANEOUS | Status: DC
  Administered 2016-09-30: 2 [IU] via SUBCUTANEOUS
  Filled 2016-09-28: qty 2

## 2016-09-28 MED ORDER — ACETAMINOPHEN 650 MG RE SUPP: 650.0000 mg | Freq: Four times a day (QID) | RECTAL | Status: DC | PRN

## 2016-09-28 MED ORDER — SODIUM CHLORIDE 0.9 % IV BOLUS (SEPSIS)
1000.0000 mL | Freq: Once | INTRAVENOUS | Status: AC
  Administered 2016-09-28: 1000 mL via INTRAVENOUS

## 2016-09-28 MED ORDER — ONDANSETRON HCL 4 MG/2ML IJ SOLN
4.0000 mg | Freq: Once | INTRAMUSCULAR | Status: AC
  Administered 2016-09-28: 4 mg via INTRAVENOUS
  Filled 2016-09-28: qty 2

## 2016-09-28 MED ORDER — ATORVASTATIN CALCIUM 20 MG PO TABS
ORAL_TABLET | ORAL | Status: AC
  Administered 2016-09-28: 40 mg via ORAL
  Filled 2016-09-28: qty 2

## 2016-09-28 MED ORDER — BUDESONIDE 3 MG PO CPEP
3.0000 mg | ORAL_CAPSULE | Freq: Every day | ORAL | Status: DC
  Administered 2016-09-29 – 2016-10-01 (×4): 3 mg via ORAL
  Filled 2016-09-28 (×5): qty 1

## 2016-09-28 MED ORDER — SODIUM CHLORIDE 0.9 % IV SOLN
INTRAVENOUS | Status: DC
  Administered 2016-09-28 – 2016-09-29 (×3): via INTRAVENOUS

## 2016-09-28 MED ORDER — LEVOTHYROXINE SODIUM 50 MCG PO TABS
50.0000 ug | ORAL_TABLET | Freq: Every day | ORAL | Status: DC
  Administered 2016-09-29 – 2016-10-02 (×4): 50 ug via ORAL
  Filled 2016-09-28 (×4): qty 1

## 2016-09-28 MED ORDER — IOPAMIDOL (ISOVUE-300) INJECTION 61%
100.0000 mL | Freq: Once | INTRAVENOUS | Status: AC | PRN
  Administered 2016-09-28: 100 mL via INTRAVENOUS

## 2016-09-28 MED ORDER — IOPAMIDOL (ISOVUE-300) INJECTION 61%
15.0000 mL | INTRAVENOUS | Status: AC
  Administered 2016-09-28 (×2): 15 mL via ORAL

## 2016-09-28 MED ORDER — INSULIN ASPART 100 UNIT/ML ~~LOC~~ SOLN
0.0000 [IU] | Freq: Three times a day (TID) | SUBCUTANEOUS | Status: DC
  Administered 2016-09-28 – 2016-09-29 (×4): 2 [IU] via SUBCUTANEOUS
  Administered 2016-09-30: 7 [IU] via SUBCUTANEOUS
  Administered 2016-09-30: 09:00:00 2 [IU] via SUBCUTANEOUS
  Administered 2016-09-30: 18:00:00 5 [IU] via SUBCUTANEOUS
  Administered 2016-10-01: 1 [IU] via SUBCUTANEOUS
  Administered 2016-10-01: 12:00:00 7 [IU] via SUBCUTANEOUS
  Filled 2016-09-28: qty 2
  Filled 2016-09-28: qty 7
  Filled 2016-09-28: qty 1
  Filled 2016-09-28 (×3): qty 2
  Filled 2016-09-28: qty 7
  Filled 2016-09-28: qty 5
  Filled 2016-09-28: qty 2

## 2016-09-28 MED ORDER — ATORVASTATIN CALCIUM 20 MG PO TABS
40.0000 mg | ORAL_TABLET | Freq: Every day | ORAL | Status: DC
  Administered 2016-09-28 – 2016-10-01 (×4): 40 mg via ORAL
  Filled 2016-09-28 (×3): qty 2

## 2016-09-28 MED ORDER — ONDANSETRON HCL 4 MG PO TABS
4.0000 mg | ORAL_TABLET | Freq: Four times a day (QID) | ORAL | Status: DC | PRN
  Administered 2016-09-29: 23:00:00 4 mg via ORAL
  Filled 2016-09-28: qty 1

## 2016-09-28 MED ORDER — ONDANSETRON HCL 4 MG/2ML IJ SOLN: 4.0000 mg | Freq: Four times a day (QID) | INTRAMUSCULAR | Status: DC | PRN

## 2016-09-28 MED ORDER — ACETAMINOPHEN 325 MG PO TABS
650.0000 mg | ORAL_TABLET | Freq: Four times a day (QID) | ORAL | Status: DC | PRN
  Administered 2016-09-28 – 2016-09-30 (×2): 650 mg via ORAL
  Filled 2016-09-28 (×2): qty 2

## 2016-09-28 MED ORDER — LOPERAMIDE HCL 2 MG PO CAPS
ORAL_CAPSULE | ORAL | Status: AC
  Administered 2016-09-28: 2 mg via ORAL
  Filled 2016-09-28: qty 1

## 2016-09-28 NOTE — ED Notes (Signed)
Helped pt to bathroom.

## 2016-09-28 NOTE — ED Triage Notes (Signed)
Pt arrived via EMS from home for reports of near syncopal episode, vomiting and diarrhea. Pt reports had flexible sigmoidoscopy three days ago and since then has had vomiting and bloody diarrhea which turned to black. EMS reports 84/45 which increased to 117/55 after 200 mL bolus, 84 HR, 98% RA, CBG 303.

## 2016-09-28 NOTE — ED Notes (Signed)
Pt resting in bed, resp even and unlabored, family at bedside, pt continues to drink contrast for CT

## 2016-09-28 NOTE — ED Notes (Signed)
CBG monitoring complete. Result 173

## 2016-09-28 NOTE — Telephone Encounter (Signed)
Spoke with pt this morning and he stated he is having uncontrollable diarrhea. You switched him to Mesalamine on Friday, but he stated he told you it made me deathly sick. He took 3 doses and started vomiting. Please advise if there is anything else we can give him. He says Imodium does not work.

## 2016-09-28 NOTE — ED Notes (Signed)
Patient transported to CT 

## 2016-09-28 NOTE — H&P (Signed)
[facility] NAME: David [name]    MR#:  [id number]  DATE OF BIRTH:  25-Jun-1946  DATE OF ADMISSION:  09/28/2016  PRIMARY CARE PHYSICIAN: Madelyn Brunner, MD   REQUESTING/REFERRING PHYSICIAN: Dr. Shirlyn Goltz  CHIEF COMPLAINT:   Chief Complaint  Patient presents with  . Near Syncope  . Emesis  . Diarrhea    HISTORY OF PRESENT ILLNESS:  David [name]  is a 70 y.o. male with a known history of CAD status post stents, GERD, hypertension, hyperlipidemia and history of diabetes mellitus and ulcerative colitis that was diagnosed about 10 years ago presents to hospital secondary to worsening nausea, vomiting and bloody diarrhea associated with abdominal pain. Patient's symptoms started about 10 years ago when he was diagnosed at [facility]. He was started on Remicade which helped a lot at the time and has been off of Remicade for almost 8 years now. His symptoms started worsening in the last 2-3 weeks after he ate a bowl of hot/ spicy stew. He's been having worsening abdominal pain with diarrhea and bloody stools. He went to see Dr. Vicente Males from GI and had a sigmoidoscopy done 3 days ago. He was started on mesalamine by mouth and also enemas which has made his symptoms worse. He remembers that he was on the same medication 10 years ago initially and had significant nausea vomiting and intolerance to the medication. He is unable to eat anything at mouth and so presents to the hospital. He is having significant bloody diarrhea here associated with left lower quadrant abdominal pain.  PAST MEDICAL HISTORY:   Past Medical History:  Diagnosis Date  . Anemia   . CAD (coronary artery disease) 07/08/2011   Overview:  a. 07/2006: Anterior ST elevation MI. b. 07/2006: PCI with BMS to LAD and RCA. c. 09/2006: Non ST elevation. d. LVEF 30%.  Discussed ICD, not currently interested.   . 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 Dyslipidemia 07/30/2011   Overview:  High triglycerides  . GERD (gastroesophageal reflux disease) 07/08/2011  . History of Clostridium difficile colitis   . History of myocardial infarction   . Hyperlipidemia, unspecified 07/08/2011  . Hypothyroidism   . 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 Type II diabetes mellitus, uncontrolled ([place]) 07/08/2011  . Vitamin D deficiency     PAST SURGICAL HISTORY:   Past Surgical History:  Procedure Laterality Date  . CARDIAC CATHETERIZATION    . CHOLECYSTECTOMY    . COLONOSCOPY  07/20/2006   Crohn's disease  . CORONARY STENT PLACEMENT    . FLEXIBLE SIGMOIDOSCOPY N/A 09/25/2016   Procedure: FLEXIBLE SIGMOIDOSCOPY;  Surgeon: [name], MD;  Location: [facility] ENDOSCOPY;  Service: Endoscopy;  Laterality: N/A;  . MELANOMA REMOVED    . parotid gland removal      SOCIAL HISTORY:   Social History  Substance Use Topics  . Smoking status: Never Smoker  . Smokeless tobacco: Never Used  . Alcohol use No    FAMILY HISTORY:   Family History  Problem Relation Age of Onset  . Heart disease Mother   . Heart attack Father   . Heart disease Sister   . Heart disease Brother     DRUG ALLERGIES:   Allergies  Allergen Reactions  . Codeine Nausea And Vomiting and Other (See Comments)    REVIEW OF SYSTEMS:   Review of Systems  Constitutional: Positive for malaise/fatigue. Negative for chills,  fever and weight loss.  HENT: Negative for ear discharge, ear pain, hearing loss, nosebleeds and tinnitus.   Eyes: Negative for blurred vision, double vision and photophobia.  Respiratory: Negative for cough, hemoptysis, shortness of breath and wheezing.   Cardiovascular: Negative for chest pain, palpitations, orthopnea and leg swelling.  Gastrointestinal: Positive for abdominal pain, blood in stool, diarrhea, nausea and vomiting. Negative for constipation, heartburn and melena.  Genitourinary: Negative for dysuria,  frequency and urgency.  Musculoskeletal: Positive for myalgias. Negative for back pain and neck pain.  Skin: Negative for rash.  Neurological: Negative for dizziness, tingling, tremors, sensory change, speech change, focal weakness and headaches.  Endo/Heme/Allergies: Does not bruise/bleed easily.  Psychiatric/Behavioral: Negative for depression.    MEDICATIONS AT HOME:   Prior to Admission medications   Medication Sig Start Date End Date Taking? Authorizing Provider  aspirin EC 81 MG tablet Take 81 mg by mouth 2 (two) times daily.    Yes Historical Provider, MD  atorvastatin (LIPITOR) 40 MG tablet Take 40 mg by mouth daily.  05/21/16  Yes Historical Provider, MD  carvedilol (COREG) 3.125 MG tablet Take 3.125 mg by mouth 2 (two) times daily with a meal.  05/21/16  Yes Historical Provider, MD  Cholecalciferol (VITAMIN D) 2000 units CAPS Take 1 capsule by mouth daily.  08/01/09  Yes Historical Provider, MD  glipiZIDE (GLUCOTROL) 10 MG tablet Take 10 mg by mouth 2 (two) times daily.  05/21/16  Yes Historical Provider, MD  levothyroxine (SYNTHROID, LEVOTHROID) 50 MCG tablet Take 50 mcg by mouth daily before breakfast.  05/21/16  Yes Historical Provider, MD  Multiple Vitamins-Minerals (MULTIVITAMIN ADULT PO) Take 1 tablet by mouth daily.  06/25/08  Yes Historical Provider, MD  Omega-3 Fatty Acids (FISH OIL PO) Take 1 capsule by mouth 2 (two) times daily.  06/25/08  Yes Historical Provider, MD  omeprazole (PRILOSEC) 20 MG capsule Take 20 mg by mouth daily.  05/21/16  Yes Historical Provider, MD  ramipril (ALTACE) 5 MG capsule Take 5 mg by mouth daily.  05/21/16  Yes Historical Provider, MD  mesalamine (PENTASA) 500 MG CR capsule Take 2 capsules (1,000 mg total) by mouth 4 (four) times daily. Patient not taking: Reported on 09/28/2016 09/25/16 10/26/16  [name], MD  mesalamine (ROWASA) 4 g enema Place 60 mLs (4 g total) rectally at bedtime. Patient not taking: Reported on 09/28/2016 09/22/16 10/23/16   [name], MD      VITAL SIGNS:  Blood pressure (!) 86/64, pulse 87, temperature 98.3 F (36.8 C), temperature source Oral, resp. rate (!) 39, height 6' (1.829 m), weight 83.9 kg (185 lb), SpO2 98 %.  PHYSICAL EXAMINATION:   Physical Exam  GENERAL:  70 y.o.-year-old patient lying in the bed with no acute distress.  EYES: Pupils equal, round, reactive to light and accommodation. No scleral icterus. Extraocular muscles intact.  HEENT: Head atraumatic, normocephalic. Oropharynx and nasopharynx clear.  NECK:  Supple, no jugular venous distention. No thyroid enlargement, no tenderness. Evidence of prior extensive left-sided Surgery for melanoma. LUNGS: Normal breath sounds bilaterally, no wheezing, rales,rhonchi or crepitation. No use of accessory muscles of respiration.  CARDIOVASCULAR: S1, S2 normal. No murmurs, rubs, or gallops.  ABDOMEN: Soft, Left lower quadrant tenderness with no guarding or rigidity,, nondistended. Bowel sounds present. No organomegaly or mass.  EXTREMITIES: No pedal edema, cyanosis, or clubbing.  NEUROLOGIC: Cranial nerves II through XII are intact. Muscle strength 5/5 in all extremities. Sensation intact. Gait not checked.  PSYCHIATRIC: The patient is alert  and oriented x 3.  SKIN: No obvious rash, lesion, or ulcer.   LABORATORY PANEL:   CBC  Recent Labs Lab 09/28/16 1247  WBC 8.1  HGB 12.6*  HCT 35.2*  PLT 246   ------------------------------------------------------------------------------------------------------------------  Chemistries   Recent Labs Lab 09/28/16 1247  NA 132*  K 3.8  CL 100*  CO2 22  GLUCOSE 223*  BUN 21*  CREATININE 1.42*  CALCIUM 8.0*  AST 19  ALT 16*  ALKPHOS 61  BILITOT 1.6*   ------------------------------------------------------------------------------------------------------------------  Cardiac Enzymes  Recent Labs Lab 09/28/16 1247  TROPONINI <0.03    ------------------------------------------------------------------------------------------------------------------  RADIOLOGY:  No results found.  EKG:   Orders placed or performed during the hospital encounter of 09/28/16  . EKG 12-Lead  . EKG 12-Lead  . EKG 12-Lead  . EKG 12-Lead    IMPRESSION AND PLAN:   David [name]  is a 70 y.o. male with a known history of CAD status post stents, GERD, hypertension, hyperlipidemia and history of diabetes mellitus and ulcerative colitis that was diagnosed about 10 years ago presents to hospital secondary to worsening nausea, vomiting and bloody diarrhea associated with abdominal pain.  #1 acute on chronic ulcerative colitis flare-started on budesonide. -GI consulted. C. difficile is pending. -IV fluids. Started on liquid diet for now and advance as tolerated. -We will stop Pentasa due to intolerance.  #2 CAD-hold aspirin with his bloody stools. Continue Coreg. Hold trimethoprim due to hypotension at this time. No active symptoms.  #3 diabetes mellitus-hold glipizide as patient is only on liquids. Start on sliding scale insulin.  #4 hypothyroidism-continue Synthroid  #5 acute renal failure-prerenal. Gentle hydration.  #6 DVT prophylaxis-Ted's and SCDs. Hold heparin products due to GI bleed    All the records are reviewed and case discussed with ED provider. Management plans discussed with the patient, family and they are in agreement.  CODE STATUS: Full code  TOTAL TIME TAKING CARE OF THIS PATIENT: 50 minutes.    Gladstone Lighter M.D on 09/28/2016 at 3:17 PM  Between 7am to 6pm - Pager - [phone]  After 6pm go to [email or web address] - password [email or web address] [facility]  Office  [phone]  CC: Primary care physician; Madelyn Brunner, MD

## 2016-09-28 NOTE — ED Notes (Signed)
Pt returned from CT, resting in bed

## 2016-09-28 NOTE — Telephone Encounter (Signed)
Patient has had uncontrollable diarrhea all weekend and needs to talk to you ASAP. He stated he didn't go to the ER but might have to

## 2016-09-28 NOTE — ED Provider Notes (Signed)
[facility] Provider Note   CSN: [id number] Arrival date & time: 09/28/16  1237     History   Chief Complaint Chief Complaint  Patient presents with  . Near Syncope  . Emesis  . Diarrhea    HPI David [name] is a 70 y.o. male hx of CAD, HL, anemia, DM, ulcerative colitis Here presenting with vomiting, diarrhea, syncope. Patient has known ulcerative colitis and saw Dr. Wilhemena Durie 4 days ago and had a sigmoidoscopy with biopsy that showed extensive ulcerations. He had blood in his stool initially and then had persistent diarrhea with melena. States that he is on the bathroom constantly since then. Had neg C diff a week ago and not on abx. He was put on melasamine but that caused him to vomit constantly. He was at home today and got up from the toilet and felt light headed and dizzy and almost passed out. He called EMS and per EMS, bp was in the 80s. Patient sent for evaluation.   The history is provided by the patient.    Past Medical History:  Diagnosis Date  . Anemia   . CAD (coronary artery disease) 07/08/2011   Overview:  a. 07/2006: Anterior ST elevation MI. b. 07/2006: PCI with BMS to LAD and RCA. c. 09/2006: Non ST elevation. d. LVEF 30%.  Discussed ICD, not currently interested.   . 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 Dyslipidemia 07/30/2011   Overview:  High triglycerides  . GERD (gastroesophageal reflux disease) 07/08/2011  . History of Clostridium difficile colitis   . History of myocardial infarction   . Hyperlipidemia, unspecified 07/08/2011  . Hypothyroidism   . 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 Type II diabetes mellitus, uncontrolled ([place]) 07/08/2011  . Vitamin D deficiency     Patient Active Problem List   Diagnosis Date Noted  . Anal or rectal pain   . Idiopathic chronic inflammatory bowel disease   . Ulceration of intestine   . Moderate chronic ulcerative colitis with rectal  bleeding ([place])   . 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 Dyslipidemia 07/30/2011  . CAD (coronary artery disease) 07/08/2011  . GERD (gastroesophageal reflux disease) 07/08/2011  . Hyperlipidemia, unspecified 07/08/2011  . 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 Type II diabetes mellitus, uncontrolled ([place]) 07/08/2011    Past Surgical History:  Procedure Laterality Date  . CARDIAC CATHETERIZATION    . CHOLECYSTECTOMY    . COLONOSCOPY  07/20/2006   Crohn's disease  . CORONARY STENT PLACEMENT    . FLEXIBLE SIGMOIDOSCOPY N/A 09/25/2016   Procedure: FLEXIBLE SIGMOIDOSCOPY;  Surgeon: [name], MD;  Location: [facility] ENDOSCOPY;  Service: Endoscopy;  Laterality: N/A;  . MELANOMA REMOVED    . parotid gland removal         Home Medications    Prior to Admission medications   Medication Sig Start Date End Date Taking? Authorizing Provider  ACCU-CHEK AVIVA PLUS test strip USE 2 (TWO) TIMES DAILY. USE AS INSTRUCTED. 09/01/16   Historical Provider, MD  ASPIRIN 81 PO Take by mouth. 06/25/08   Historical Provider, MD  atorvastatin (LIPITOR) 40 MG tablet Take by mouth. 05/21/16   Historical Provider, MD  carvedilol (COREG) 3.125 MG tablet Take by mouth. 05/21/16   Historical Provider, MD  Cholecalciferol (VITAMIN D) 2000 units CAPS Take by mouth. 08/01/09   Historical Provider, MD  glipiZIDE (GLUCOTROL) 10 MG tablet Take  by mouth. 05/21/16   Historical Provider, MD  levothyroxine (SYNTHROID, LEVOTHROID) 50 MCG tablet Take by mouth. 05/21/16   Historical Provider, MD  mesalamine (PENTASA) 500 MG CR capsule Take 2 capsules (1,000 mg total) by mouth 4 (four) times daily. 09/25/16 10/26/16  [name], MD  mesalamine (ROWASA) 4 g enema Place 60 mLs (4 g total) rectally at bedtime. 09/22/16 10/23/16  [name], MD  Multiple Vitamins-Minerals (MULTIVITAMIN ADULT PO) Take by mouth. 06/25/08    Historical Provider, MD  Omega-3 Fatty Acids (FISH OIL PO) Take by mouth. 06/25/08   Historical Provider, MD  omeprazole (PRILOSEC) 20 MG capsule Take by mouth. 05/21/16   Historical Provider, MD  ramipril (ALTACE) 5 MG capsule Take by mouth. 05/21/16   Historical Provider, MD    Family History Family History  Problem Relation Age of Onset  . Heart disease Mother   . Heart attack Father   . Heart disease Sister   . Heart disease Brother     Social History Social History  Substance Use Topics  . Smoking status: Never Smoker  . Smokeless tobacco: Never Used  . Alcohol use No     Allergies   Codeine   Review of Systems Review of Systems  Cardiovascular: Positive for near-syncope.  Gastrointestinal: Positive for diarrhea and vomiting.  Neurological: Positive for dizziness.  All other systems reviewed and are negative.    Physical Exam Updated Vital Signs BP 109/66 (BP Location: Right Arm)   Pulse 88   Temp 98.3 F (36.8 C) (Oral)   Ht 6' (1.829 m)   Wt 185 lb (83.9 kg)   SpO2 100%   BMI 25.09 kg/m   Physical Exam  Constitutional: He is oriented to person, place, and time.  Chronically ill, dehydrated   HENT:  Head: Normocephalic.  MM dry   Eyes: EOM are normal. Pupils are equal, round, and reactive to light.  Neck: Normal range of motion. Neck supple.  Cardiovascular: Regular rhythm and normal heart sounds.   Slightly tachy   Pulmonary/Chest: Effort normal and breath sounds normal. No respiratory distress. He has no wheezes. He has no rales.  Abdominal: Soft. Bowel sounds are normal.  Mild LLQ and LUQ tenderness   Musculoskeletal: Normal range of motion.  Neurological: He is alert and oriented to person, place, and time.  Skin: Skin is warm.  Psychiatric: He has a normal mood and affect.  Nursing note and vitals reviewed.    ED Treatments / Results  Labs (all labs ordered are listed, but only abnormal results are displayed) Labs Reviewed  CBC WITH  DIFFERENTIAL/PLATELET - Abnormal; Notable for the following:       Result Value   RBC 3.99 (*)    Hemoglobin 12.6 (*)    HCT 35.2 (*)    Lymphs Abs 0.7 (*)    Monocytes Absolute 1.1 (*)    All other components within normal limits  COMPREHENSIVE METABOLIC PANEL - Abnormal; Notable for the following:    Sodium 132 (*)    Chloride 100 (*)    Glucose, Bld 223 (*)    BUN 21 (*)    Creatinine, Ser 1.42 (*)    Calcium 8.0 (*)    Total Protein 6.0 (*)    Albumin 2.9 (*)    ALT 16 (*)    Total Bilirubin 1.6 (*)    GFR calc non Af Amer 49 (*)    GFR calc Af Amer 56 (*)    All other components within  normal limits  C DIFFICILE QUICK SCREEN W PCR REFLEX  TROPONIN I  TYPE AND SCREEN    EKG  EKG Interpretation None      ED ECG REPORT I, Wandra Arthurs, the attending physician, personally viewed and interpreted this ECG.   Date: 09/28/2016  EKG Time:12:44 pm  Rate: 84  Rhythm: normal EKG, normal sinus rhythm  Axis: normal  Intervals:none  ST&T Change: nonspecific     Radiology No results found.  Procedures Procedures (including critical care time)  Medications Ordered in ED Medications  sodium chloride 0.9 % bolus 1,000 mL (not administered)  sodium chloride 0.9 % bolus 1,000 mL (1,000 mLs Intravenous New Bag/Given 09/28/16 1258)  ondansetron (ZOFRAN) injection 4 mg (4 mg Intravenous Given 09/28/16 1330)  iopamidol (ISOVUE-300) 61 % injection 15 mL (15 mLs Oral Contrast Given 09/28/16 1403)     Initial Impression / Assessment and Plan / ED Course  I have reviewed the triage vital signs and the nursing notes.  Pertinent labs & imaging results that were available during my care of the patient were reviewed by me and considered in my medical decision making (see chart for details).  Clinical Course     NIEL PERETTI is a 70 y.o. male here with abdominal pain, melena, syncope. Concerned for lower GI bleed from either biopsy or UC flare. Will check labs, type and  screen. Will give IVF, get orthostatics, CT ab/pel.   2:09 PM Hg 12.6, was 13.9 5 days ago. Dropped BP to 80s after 1 L NS bolus. Will give second bolus. Cr 1.4, slightly elevated compared to baseline. Occ positive, black stool. Concerned for UC flare. I consulted Dr. Allen Norris, who will see patient in the hospital. Hospitalist to admit.   Final Clinical Impressions(s) / ED Diagnoses   Final diagnoses:  None    New Prescriptions New Prescriptions   No medications on file     Drenda Freeze, MD 09/28/16 1410

## 2016-09-29 ENCOUNTER — Inpatient Hospital Stay: Payer: Medicare Other

## 2016-09-29 DIAGNOSIS — K51919 Ulcerative colitis, unspecified with unspecified complications: Secondary | ICD-10-CM

## 2016-09-29 DIAGNOSIS — K922 Gastrointestinal hemorrhage, unspecified: Secondary | ICD-10-CM

## 2016-09-29 DIAGNOSIS — E43 Unspecified severe protein-calorie malnutrition: Secondary | ICD-10-CM | POA: Insufficient documentation

## 2016-09-29 LAB — BASIC METABOLIC PANEL
Anion gap: 7 (ref 5–15)
BUN: 15 mg/dL (ref 6–20)
CHLORIDE: 103 mmol/L (ref 101–111)
CO2: 23 mmol/L (ref 22–32)
CREATININE: 1 mg/dL (ref 0.61–1.24)
Calcium: 7.8 mg/dL — ABNORMAL LOW (ref 8.9–10.3)
GFR calc non Af Amer: >60 mL/min (ref >60)
GLUCOSE: 170 mg/dL — AB (ref 65–99)
Potassium: 3.7 mmol/L (ref 3.5–5.1)
Sodium: 133 mmol/L — ABNORMAL LOW (ref 135–145)

## 2016-09-29 LAB — CBC
HCT: 33.8 % — ABNORMAL LOW (ref 40.0–52.0)
Hemoglobin: 12.1 g/dL — ABNORMAL LOW (ref 13.0–18.0)
MCH: 32.2 pg (ref 26.0–34.0)
MCHC: 35.9 g/dL (ref 32.0–36.0)
MCV: 89.5 fL (ref 80.0–100.0)
PLATELETS: 227 10*3/uL (ref 150–440)
RBC: 3.78 MIL/uL — AB (ref 4.40–5.90)
RDW: 14.1 % (ref 11.5–14.5)
WBC: 7.8 10*3/uL (ref 3.8–10.6)

## 2016-09-29 LAB — GLUCOSE, CAPILLARY
GLUCOSE-CAPILLARY: 136 mg/dL — AB (ref 65–99)
Glucose-Capillary: 156 mg/dL — ABNORMAL HIGH (ref 65–99)
Glucose-Capillary: 187 mg/dL — ABNORMAL HIGH (ref 65–99)
Glucose-Capillary: 167 mg/dL — ABNORMAL HIGH (ref 65–99)

## 2016-09-29 MED ORDER — PREDNISONE 20 MG PO TABS
40.0000 mg | ORAL_TABLET | Freq: Every day | ORAL | Status: AC
  Administered 2016-09-30 – 2016-10-02 (×3): 40 mg via ORAL
  Filled 2016-09-29 (×3): qty 2

## 2016-09-29 MED ORDER — ENSURE ENLIVE PO LIQD
237.0000 mL | Freq: Four times a day (QID) | ORAL | Status: DC
  Administered 2016-09-29 – 2016-10-02 (×10): 237 mL via ORAL

## 2016-09-29 NOTE — Progress Notes (Signed)
[name] NAME: David [name]    MR#:  [id number]  DATE OF BIRTH:  02-Jan-1946  SUBJECTIVE:   Patient is here due to nausea vomiting and diarrhea which was bloody in nature. CT scan is suggestive of colitis/enteritis. Patient does have a previous history of ulcerative colitis. Seen by gastroenterology and started on Entocort. Tolerating full liquid diet. Family at bedside.   REVIEW OF SYSTEMS:    Review of Systems  Constitutional: Negative for chills and fever.  HENT: Negative for congestion and tinnitus.   Eyes: Negative for blurred vision and double vision.  Respiratory: Negative for cough, shortness of breath and wheezing.   Cardiovascular: Negative for chest pain, orthopnea and PND.  Gastrointestinal: Positive for diarrhea, nausea and vomiting. Negative for abdominal pain.  Genitourinary: Negative for dysuria and hematuria.  Neurological: Negative for dizziness, sensory change and focal weakness.  All other systems reviewed and are negative.   Nutrition: Full liquid Tolerating Diet: Yes Tolerating PT: Await Eval.    DRUG ALLERGIES:   Allergies  Allergen Reactions  . Codeine Nausea And Vomiting and Other (See Comments)    VITALS:  Blood pressure 124/60, pulse 81, temperature 99 F (37.2 C), temperature source Oral, resp. rate 16, height 5' 11"  (1.803 m), weight 83.8 kg (184 lb 12.8 oz), SpO2 99 %.  PHYSICAL EXAMINATION:   Physical Exam  GENERAL:  70 y.o.-year-old patient lying in the bed in no acute distress.  EYES: Pupils equal, round, reactive to light and accommodation. No scleral icterus. Extraocular muscles intact.  HEENT: Head atraumatic, normocephalic. Oropharynx and nasopharynx clear.  NECK:  Supple, no jugular venous distention. No thyroid enlargement, no tenderness.  LUNGS: Normal breath sounds bilaterally, no wheezing, rales, rhonchi. No use of accessory muscles of respiration.  CARDIOVASCULAR: S1,  S2 normal. No murmurs, rubs, or gallops.  ABDOMEN: Soft, nontender, nondistended. Bowel sounds present. No organomegaly or mass.  EXTREMITIES: No cyanosis, clubbing or edema b/l.    NEUROLOGIC: Cranial nerves II through XII are intact. No focal Motor or sensory deficits b/l.   PSYCHIATRIC: The patient is alert and oriented x 3.  SKIN: No obvious rash, lesion, or ulcer.    LABORATORY PANEL:   CBC  Recent Labs Lab 09/29/16 0424  WBC 7.8  HGB 12.1*  HCT 33.8*  PLT 227   ------------------------------------------------------------------------------------------------------------------  Chemistries   Recent Labs Lab 09/28/16 1247 09/29/16 0424  NA 132* 133*  K 3.8 3.7  CL 100* 103  CO2 22 23  GLUCOSE 223* 170*  BUN 21* 15  CREATININE 1.42* 1.00  CALCIUM 8.0* 7.8*  AST 19  --   ALT 16*  --   ALKPHOS 61  --   BILITOT 1.6*  --    ------------------------------------------------------------------------------------------------------------------  Cardiac Enzymes  Recent Labs Lab 09/28/16 1247  TROPONINI <0.03   ------------------------------------------------------------------------------------------------------------------  RADIOLOGY:  Ct Abdomen Pelvis W Contrast  Result Date: 09/28/2016 CLINICAL DATA:  Left lower quadrant pain with recent bloody diarrhea, history of ulcerative colitis EXAM: CT ABDOMEN AND PELVIS WITH CONTRAST TECHNIQUE: Multidetector CT imaging of the abdomen and pelvis was performed using the standard protocol following bolus administration of intravenous contrast. CONTRAST:  172m ISOVUE-300 IOPAMIDOL (ISOVUE-300) INJECTION 61% COMPARISON:  10/07/2011 FINDINGS: Lower chest: Lung bases are well visualized. 7 mm stable nodule is noted in the right lower lobe laterally. Minimal scarring is noted in the right middle lobe stable from the prior exam. These changes are benign given their chronicity. Hepatobiliary:  The liver is diffusely decreased in  attenuation consistent with fatty infiltration. The gallbladder has been surgically removed. Pancreas: Unremarkable. No pancreatic ductal dilatation or surrounding inflammatory changes. Spleen: Normal in size without focal abnormality. Adrenals/Urinary Tract: The adrenal glands are within normal limits. The kidneys demonstrate a normal enhancement pattern. No abnormal mass is identified. Delayed images demonstrate normal excretion. Bladder is well distended. Stomach/Bowel: Stomach is within normal limits. Appendix appears normal. Small bowel is within normal limits. There is diffuse wall thickening of the colon from approximately the proximal transverse colon extending distally to the rectum. Diverticular change is seen. Only mild very colonic inflammatory change is seen. Changes may be related to the patient's underlying ulcerative colitis. The possibility of an infectious etiology would deserve consideration as well. Scattered associated mesenteric lymph nodes are noted. Vascular/Lymphatic: Scattered reactive mesenteric lymph nodes. No other significant lymphadenopathy is noted. Aortic atherosclerotic change is seen without aneurysmal dilatation. Reproductive: Prostate is unremarkable. Other: No abdominal wall hernia or abnormality. No abdominopelvic ascites. Musculoskeletal: Degenerative change of the lumbar spine is noted. IMPRESSION: Diffuse colonic wall thickening from the proximal transverse colon to the rectum. Differential includes patient's known ulcerative colitis are possible infectious or ischemic etiologies. Chronic changes as described above. Electronically Signed   By: Inez Catalina M.D.   On: 09/28/2016 16:01     ASSESSMENT AND PLAN:   David [name]  is a 70 y.o. male with a known history of CAD status post stents, GERD, hypertension, hyperlipidemia and history of diabetes mellitus and ulcerative colitis that was diagnosed about 10 years ago presents to hospital secondary to worsening nausea,  vomiting and bloody diarrhea associated with abdominal pain.  #1 acute on chronic ulcerative colitis flare- appreciate GI input and cont. Entocort for now.  - pt. Has previously not tolerated Pentasa in the past. Pt. Has been on Remicade before.   - appreciate GI input and cont. Full liquid diet.   - cont. Supportive care.  PRN Imodium for diarrhea.   #2 CAD- cont. To hold aspirin with his bloody stools.  - Continue Coreg.  No acute symptoms.   #3 diabetes mellitus-cont. To hold glipizide.  - cont. SSI.   #4 hypothyroidism-continue Synthroid  #5 acute renal failure-prerenal.  - improved w/ hydration and will monitor.   #6 GERD - cont. Protonix.   #7 Hyperlipidemia - cont. Atorvastatin.   All the records are reviewed and case discussed with Care Management/Social Worker. Management plans discussed with the patient, family and they are in agreement.  CODE STATUS: Full code  DVT Prophylaxis: TEd's & SCD's.   TOTAL TIME TAKING CARE OF THIS PATIENT: 30 minutes.   POSSIBLE D/C IN 1-2 DAYS, DEPENDING ON CLINICAL CONDITION.   Henreitta Leber M.D on 09/29/2016 at 3:21 PM  Between 7am to 6pm - Pager - [phone]  After 6pm go to [email or web address] - Proofreader  [facility]  Office  [phone]  CC: Primary care physician; Madelyn Brunner, MD

## 2016-09-29 NOTE — Plan of Care (Signed)
Problem: Health Behavior/Discharge Planning: Goal: Ability to manage health-related needs will improve Outcome: Not Applicable Date Met: 98/10/25 Pt unable to care himself.  Placement being sought.  Problem: Activity: Goal: Risk for activity intolerance will decrease Outcome: Not Applicable Date Met: 48/62/82 Bedbound, bedrest  Problem: Fluid Volume: Goal: Ability to maintain a balanced intake and output will improve Outcome: Progressing Pt eating ice cream and chocolate milk  Problem: Nutrition: Goal: Adequate nutrition will be maintained Outcome: Progressing Tolerating food and drink

## 2016-09-29 NOTE — Progress Notes (Signed)
Initial Nutrition Assessment  DOCUMENTATION CODES:   Severe malnutrition in context of acute illness/injury  INTERVENTION:  Provide Ensure Enlive po QID, each supplement provides 350 kcal and 20 grams of protein. Can decrease as PO intake improves.  Patient would benefit from education regarding nutritional management of Ulcerative Colitis. He does not want to discuss today - will attempt to provide education on follow-up.  NUTRITION DIAGNOSIS:   Inadequate oral intake related to poor appetite, nausea, other (see comment) (diarrhea, abdominal pain) as evidenced by per patient/family report, 4 percent weight loss over 2 weeks.  GOAL:   Patient will meet greater than or equal to 90% of their needs  MONITOR:   PO intake, Supplement acceptance, Diet advancement, Labs, Weight trends, I & O's  REASON FOR ASSESSMENT:   Malnutrition Screening Tool    ASSESSMENT:   70 y.o. male with a known history of CAD status post stents, GERD, hypertension, hyperlipidemia and history of diabetes mellitus and ulcerative colitis that was diagnosed about 10 years ago presents to hospital secondary to worsening nausea, vomiting and bloody diarrhea associated with abdominal pain.   Spoke with patient at bedside. He reports poor appetite for the past month. He has been experiencing bloody diarrhea for the past 2 weeks, and nausea/abdominal pain for the past week after starting on a medication he does not tolerate. For the past 2 weeks patient has been tolerating small amounts of food per day such as 2 peanut butter crackers or boiled egg with Jell-O (reports this was total intake for the entire day). He was able to tolerate ice cream today. Prior to onset of symptoms patient was eating 100% of 3 meals per day. Patient does not like Boost but is amenable to trying Ensure Enlive.  Patient reports he has never seen a RD for his UC. He reports having frequent diarrhea and he is unsure which foods exacerbate his  symptoms. He does not currently avoid any food for UC. Patient reports he does not want education today but is open to discussing later in admission.  Reports UBW was 192 lbs. Per patient he has lost 8 lbs (4% body weight) over 2 weeks, which is significant for time frame.  Meal Completion: 60% of FLD breakfast per chart.  Medications reviewed and include: Novolog sliding scale TID with meals and daily HS, levothyroxine, pantoprazole, NS @ 60 ml/hr, Imodium PRN.  Labs reviewed: CBG 156-187 past 24 hrs, Sodium 133.  Nutrition-Focused physical exam completed. Findings are no fat depletion, mild muscle depletion, and no edema.   Patient meets criteria for severe acute malnutrition in setting of 4% weight loss over 2 weeks, intake </= 50% of estimated energy requirement for >/= 5 days.  Diet Order:  Diet full liquid Room service appropriate? Yes; Fluid consistency: Thin  Skin:  Wound (see comment) (MSAD to anus)  Last BM:  09/29/2016  Height:   Ht Readings from Last 1 Encounters:  09/28/16 5' 11"  (1.803 m)    Weight:   Wt Readings from Last 1 Encounters:  09/28/16 184 lb 12.8 oz (83.8 kg)    Ideal Body Weight:  78.2 kg  BMI:  Body mass index is 25.77 kg/m.  Estimated Nutritional Needs:   Kcal:  1950-2115 (MSJ x 1.2-1.3)  Protein:  100-125 grams (1.2-1.5 grams/kg)  Fluid:  >/= 2 L/day (25 ml/kg)  EDUCATION NEEDS:   Education needs no appropriate at this time  Willey Blade, MS, RD, LDN Pager: [phone] After Hours Pager: [phone]

## 2016-09-29 NOTE — Progress Notes (Signed)
Pt has been sleeping for several hours.  Woke pt to check blood sugar.  Pt complains of increased right mid quadrant pain 7/10.  Pt has low grade temp of 99.6, BP 111/45.  Called Dr Jannifer Franklin to discuss and received order for abdominal and chest xry.  Pt given imodium and Zofran.  Pt now up to bsc with liquid stool, no blood in stool. Dorna Bloom RN

## 2016-09-29 NOTE — Progress Notes (Signed)
Pt initially refused to take Budesonide because he said the doctor had not informed him about it but after reading patient education on the medication, he agreed to take it.  Pt with small liquid bm and some cramps at this time. Dorna Bloom RN

## 2016-09-29 NOTE — Telephone Encounter (Signed)
Pt is currently admitted at [facility].

## 2016-09-29 NOTE — Progress Notes (Signed)
High fall risk per protocol. Fell at home yesterday. Refuses bed alarm. Educated patient to call for assistance out of bed.

## 2016-09-29 NOTE — Consult Note (Addendum)
Lucilla Lame, MD [facility]  [address] [place], [place] [address] Phone: [phone] Fax : [phone]  Consultation  Referring Provider:     Dr. Tressia Miners Primary Care Physician:  Madelyn Brunner, MD Primary Gastroenterologist:  Dr. Vicente Males         Reason for Consultation:     Ulcerative colitis  Date of Admission:  09/28/2016 Date of Consultation:  09/29/2016         HPI:   David [name] is a 70 y.o. male who recently was seen by Dr. Vicente Males and had a flex sigmoidoscopy that showed him to have severe ulcerative colitis. The patient had a history of ulcerative colitis with a four-week hospital stay at [facility] approximate 10 years ago. The patient states that his gastrologist retired 2 years ago and he has not been seeing any body and stops his medication. The patient had been doing well until seeing Dr. Vicente Males in the office with diarrhea and rectal bleeding. The patient was sent home on Pentasa and Rowasa. The patient states that he had a bad experience with Asacol in the past with side effects. He was doing well on Remicade but has not taken that for some time. The patient reports that he was feeling better at home and when EMS came he was found to be hypotensive. When they called me from the ER they said that he was having orthostatic hypotension. He should reports that he was having brown stools with blood in it but now he states it is yellow. He also reports that the diarrhea is 10-20 times a day. The patient's abdominal pain is located mostly in the left side of his abdomen. The patient had been started on desonide yesterday.    Past Medical History:  Diagnosis Date  . Anemia   . CAD (coronary artery disease) 07/08/2011   Overview:  a. 07/2006: Anterior ST elevation MI. b. 07/2006: PCI with BMS to LAD and RCA. c. 09/2006: Non ST elevation. d. LVEF 30%.  Discussed ICD, not currently interested.   . 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 ([place]) 6/96/2952  . Dyslipidemia  07/30/2011   Overview:  High triglycerides  . GERD (gastroesophageal reflux disease) 07/08/2011  . History of Clostridium difficile colitis   . History of myocardial infarction   . Hyperlipidemia, unspecified 07/08/2011  . Hypothyroidism   . 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 Type II diabetes mellitus, uncontrolled ([place]) 07/08/2011  . Vitamin D deficiency     Past Surgical History:  Procedure Laterality Date  . CARDIAC CATHETERIZATION    . CHOLECYSTECTOMY    . COLONOSCOPY  07/20/2006   Crohn's disease  . CORONARY STENT PLACEMENT    . FLEXIBLE SIGMOIDOSCOPY N/A 09/25/2016   Procedure: FLEXIBLE SIGMOIDOSCOPY;  Surgeon: [name], MD;  Location: [facility] ENDOSCOPY;  Service: Endoscopy;  Laterality: N/A;  . MELANOMA REMOVED    . parotid gland removal      Prior to Admission medications   Medication Sig Start Date End Date Taking? Authorizing Provider  aspirin EC 81 MG tablet Take 81 mg by mouth 2 (two) times daily.    Yes Historical Provider, MD  atorvastatin (LIPITOR) 40 MG tablet Take 40 mg by mouth daily.  05/21/16  Yes Historical Provider, MD  carvedilol (COREG) 3.125 MG tablet Take 3.125 mg by mouth 2 (two) times daily with a meal.  05/21/16  Yes Historical Provider, MD  Cholecalciferol (VITAMIN D) 2000 units CAPS Take 1 capsule by mouth daily.  08/01/09  Yes Historical Provider, MD  glipiZIDE (GLUCOTROL) 10 MG tablet Take 10 mg by mouth 2 (two) times daily.  05/21/16  Yes Historical Provider, MD  levothyroxine (SYNTHROID, LEVOTHROID) 50 MCG tablet Take 50 mcg by mouth daily before breakfast.  05/21/16  Yes Historical Provider, MD  Multiple Vitamins-Minerals (MULTIVITAMIN ADULT PO) Take 1 tablet by mouth daily.  06/25/08  Yes Historical Provider, MD  Omega-3 Fatty Acids (FISH OIL PO) Take 1 capsule by mouth 2 (two) times daily.  06/25/08  Yes Historical Provider, MD  omeprazole (PRILOSEC) 20 MG capsule Take 20 mg by mouth daily.  05/21/16  Yes Historical Provider, MD   ramipril (ALTACE) 5 MG capsule Take 5 mg by mouth daily.  05/21/16  Yes Historical Provider, MD  mesalamine (PENTASA) 500 MG CR capsule Take 2 capsules (1,000 mg total) by mouth 4 (four) times daily. Patient not taking: Reported on 09/28/2016 09/25/16 10/26/16  [name], MD  mesalamine (ROWASA) 4 g enema Place 60 mLs (4 g total) rectally at bedtime. Patient not taking: Reported on 09/28/2016 09/22/16 10/23/16  [name], MD    Family History  Problem Relation Age of Onset  . Heart disease Mother   . Heart attack Father   . Heart disease Sister   . Heart disease Brother      Social History  Substance Use Topics  . Smoking status: Never Smoker  . Smokeless tobacco: Never Used  . Alcohol use No    Allergies as of 09/28/2016 - Review Complete 09/28/2016  Allergen Reaction Noted  . Codeine Nausea And Vomiting and Other (See Comments)     Review of Systems:    All systems reviewed and negative except where noted in HPI.   Physical Exam:  Vital signs in last 24 hours: Temp:  [98 F (36.7 C)-99.3 F (37.4 C)] 98.1 F (36.7 C) (12/19 0349) Pulse Rate:  [81-96] 81 (12/19 0349) Resp:  [17-39] 18 (12/19 0349) BP: (86-134)/(51-66) 130/57 (12/19 0349) SpO2:  [98 %-100 %] 99 % (12/19 0349) Weight:  [184 lb 12.8 oz (83.8 kg)-185 lb (83.9 kg)] 184 lb 12.8 oz (83.8 kg) (12/18 1846) Last BM Date: 09/28/16 General:   Pleasant, cooperative in NAD Head:  Normocephalic and atraumatic. Eyes:   No icterus.   Conjunctiva pink. PERRLA. Ears:  Normal auditory acuity. Neck:  Supple; no masses or thyroidomegaly Lungs: Respirations even and unlabored. Lungs clear to auscultation bilaterally.   No wheezes, crackles, or rhonchi.  Heart:  Regular rate and rhythm;  Without murmur, clicks, rubs or gallops Abdomen:  Soft, nondistended, positive tenderness in the left abdomen.. Normal bowel sounds. No appreciable masses or hepatomegaly.  No rebound or guarding.  Rectal:  Not performed. Msk:   Symmetrical without gross deformities.    Extremities:  Without edema, cyanosis or clubbing. Neurologic:  Alert and oriented x3;  grossly normal neurologically. Skin:  Intact without significant lesions or rashes. Cervical Nodes:  No significant cervical adenopathy. Psych:  Alert and cooperative. Normal affect.  LAB RESULTS:  Recent Labs  09/28/16 1247 09/29/16 0424  WBC 8.1 7.8  HGB 12.6* 12.1*  HCT 35.2* 33.8*  PLT 246 227   BMET  Recent Labs  09/28/16 1247 09/29/16 0424  NA 132* 133*  K 3.8 3.7  CL 100* 103  CO2 22 23  GLUCOSE 223* 170*  BUN 21* 15  CREATININE 1.42* 1.00  CALCIUM 8.0* 7.8*   LFT  Recent Labs  09/28/16 1247  PROT 6.0*  ALBUMIN 2.9*  AST 19  ALT 16*  ALKPHOS 61  BILITOT 1.6*   PT/INR No results for input(s): LABPROT, INR in the last 72 hours.  STUDIES: Ct Abdomen Pelvis W Contrast  Result Date: 09/28/2016 CLINICAL DATA:  Left lower quadrant pain with recent bloody diarrhea, history of ulcerative colitis EXAM: CT ABDOMEN AND PELVIS WITH CONTRAST TECHNIQUE: Multidetector CT imaging of the abdomen and pelvis was performed using the standard protocol following bolus administration of intravenous contrast. CONTRAST:  179m ISOVUE-300 IOPAMIDOL (ISOVUE-300) INJECTION 61% COMPARISON:  10/07/2011 FINDINGS: Lower chest: Lung bases are well visualized. 7 mm stable nodule is noted in the right lower lobe laterally. Minimal scarring is noted in the right middle lobe stable from the prior exam. These changes are benign given their chronicity. Hepatobiliary: The liver is diffusely decreased in attenuation consistent with fatty infiltration. The gallbladder has been surgically removed. Pancreas: Unremarkable. No pancreatic ductal dilatation or surrounding inflammatory changes. Spleen: Normal in size without focal abnormality. Adrenals/Urinary Tract: The adrenal glands are within normal limits. The kidneys demonstrate a normal enhancement pattern. No abnormal  mass is identified. Delayed images demonstrate normal excretion. Bladder is well distended. Stomach/Bowel: Stomach is within normal limits. Appendix appears normal. Small bowel is within normal limits. There is diffuse wall thickening of the colon from approximately the proximal transverse colon extending distally to the rectum. Diverticular change is seen. Only mild very colonic inflammatory change is seen. Changes may be related to the patient's underlying ulcerative colitis. The possibility of an infectious etiology would deserve consideration as well. Scattered associated mesenteric lymph nodes are noted. Vascular/Lymphatic: Scattered reactive mesenteric lymph nodes. No other significant lymphadenopathy is noted. Aortic atherosclerotic change is seen without aneurysmal dilatation. Reproductive: Prostate is unremarkable. Other: No abdominal wall hernia or abnormality. No abdominopelvic ascites. Musculoskeletal: Degenerative change of the lumbar spine is noted. IMPRESSION: Diffuse colonic wall thickening from the proximal transverse colon to the rectum. Differential includes patient's known ulcerative colitis are possible infectious or ischemic etiologies. Chronic changes as described above. Electronically Signed   By: MInez CatalinaM.D.   On: 09/28/2016 16:01      Impression / Plan:   CYASIR KITNERis a 70y.o. y/o male with a history of ulcerative colitis for over 10 years. The patient was recently started on mesalamine and had a reaction to it. The patient had a CT scan that showed him to have diffuse colonic wall thickening in the proximal transverse colon all the way to the rectum. The patient is now taking budesonide and reports feeling better. The patient will also be started on steroids and when better discharged to follow up in the office for treatment with possible immunologic versus biological agents. The patient had been doing well on Remicade in the past but because of his exposure in the  past and its immunogenicity. The patient should avoid mesalamine in the future due to his reaction. The patient should also follow up as an outpatient with [place] The patient has been explained the plan and agrees with it.  Thank you for involving me in the care of this patient.      LOS: 1 day   DLucilla Lame MD  09/29/2016, 7:24 AM   Note: This dictation was prepared with Dragon dictation along with smaller phrase technology. Any transcriptional errors that result from this process are unintentional.

## 2016-09-30 ENCOUNTER — Ambulatory Visit: Payer: Medicare Other | Admitting: Gastroenterology

## 2016-09-30 LAB — GLUCOSE, CAPILLARY
GLUCOSE-CAPILLARY: 157 mg/dL — AB (ref 65–99)
GLUCOSE-CAPILLARY: 307 mg/dL — AB (ref 65–99)
GLUCOSE-CAPILLARY: 297 mg/dL — AB (ref 65–99)
GLUCOSE-CAPILLARY: 258 mg/dL — AB (ref 65–99)

## 2016-09-30 LAB — SURGICAL PATHOLOGY

## 2016-09-30 MED ORDER — SALINE SPRAY 0.65 % NA SOLN
1.0000 | NASAL | Status: DC | PRN
  Administered 2016-09-30: 21:00:00 1 via NASAL
  Filled 2016-09-30: qty 44

## 2016-09-30 MED ORDER — HYDROCODONE-ACETAMINOPHEN 5-325 MG PO TABS
1.0000 | ORAL_TABLET | Freq: Four times a day (QID) | ORAL | Status: DC | PRN
  Administered 2016-09-30: 1 via ORAL
  Filled 2016-09-30 (×2): qty 1

## 2016-09-30 MED ORDER — ALUM & MAG HYDROXIDE-SIMETH 200-200-20 MG/5ML PO SUSP
30.0000 mL | Freq: Four times a day (QID) | ORAL | Status: DC | PRN
  Administered 2016-09-30: 30 mL via ORAL
  Filled 2016-09-30: qty 30

## 2016-09-30 MED ORDER — HYDROCODONE-ACETAMINOPHEN 5-325 MG PO TABS
1.0000 | ORAL_TABLET | Freq: Four times a day (QID) | ORAL | Status: DC | PRN
  Administered 2016-09-30: 1 via ORAL
  Filled 2016-09-30: qty 1

## 2016-09-30 MED ORDER — DIPHENHYDRAMINE HCL 25 MG PO CAPS
25.0000 mg | ORAL_CAPSULE | Freq: Every evening | ORAL | Status: DC | PRN
  Administered 2016-09-30 – 2016-10-01 (×3): 25 mg via ORAL
  Filled 2016-09-30 (×3): qty 1

## 2016-09-30 MED ORDER — GUAIFENESIN 100 MG/5ML PO SOLN
5.0000 mL | ORAL | Status: DC | PRN
  Administered 2016-09-30: 100 mg via ORAL
  Filled 2016-09-30: qty 10

## 2016-09-30 NOTE — Progress Notes (Signed)
[place] at [place] NAME: David [name]    MR#:  [id number]  DATE OF BIRTH:  12/28/1945  SUBJECTIVE:   Patient is here due to nausea vomiting and diarrhea which was bloody in nature. CT scan is suggestive of colitis/enteritis. Continues to have diarrhea but its nonbloody. Tolerating a full liquid diet.  REVIEW OF SYSTEMS:    Review of Systems  Constitutional: Negative for chills and fever.  HENT: Negative for congestion and tinnitus.   Eyes: Negative for blurred vision and double vision.  Respiratory: Negative for cough, shortness of breath and wheezing.   Cardiovascular: Negative for chest pain, orthopnea and PND.  Gastrointestinal: Positive for diarrhea, nausea and vomiting. Negative for abdominal pain.  Genitourinary: Negative for dysuria and hematuria.  Neurological: Negative for dizziness, sensory change and focal weakness.  All other systems reviewed and are negative.   Nutrition: Full liquid Tolerating Diet: Yes Tolerating PT: Await Eval.     DRUG ALLERGIES:   Allergies  Allergen Reactions  . Codeine Nausea And Vomiting and Other (See Comments)    VITALS:  Blood pressure 119/61, pulse 81, temperature 98.2 F (36.8 C), temperature source Oral, resp. rate 16, height 5' 11"  (1.803 m), weight 83.1 kg (183 lb 4 oz), SpO2 96 %.  PHYSICAL EXAMINATION:   Physical Exam  GENERAL:  70 y.o.-year-old patient lying in the bed in no acute distress.  EYES: Pupils equal, round, reactive to light and accommodation. No scleral icterus. Extraocular muscles intact.  HEENT: Head atraumatic, normocephalic. Oropharynx and nasopharynx clear.  NECK:  Supple, no jugular venous distention. No thyroid enlargement, no tenderness.  LUNGS: Normal breath sounds bilaterally, no wheezing, rales, rhonchi. No use of accessory muscles of respiration.  CARDIOVASCULAR: S1, S2 normal. No murmurs, rubs, or gallops.  ABDOMEN: Soft, nontender, nondistended.  Bowel sounds present. No organomegaly or mass.  EXTREMITIES: No cyanosis, clubbing or edema b/l.    NEUROLOGIC: Cranial nerves II through XII are intact. No focal Motor or sensory deficits b/l.   PSYCHIATRIC: The patient is alert and oriented x 3.  SKIN: No obvious rash, lesion, or ulcer.    LABORATORY PANEL:   CBC  Recent Labs Lab 09/29/16 0424  WBC 7.8  HGB 12.1*  HCT 33.8*  PLT 227   ------------------------------------------------------------------------------------------------------------------  Chemistries   Recent Labs Lab 09/28/16 1247 09/29/16 0424  NA 132* 133*  K 3.8 3.7  CL 100* 103  CO2 22 23  GLUCOSE 223* 170*  BUN 21* 15  CREATININE 1.42* 1.00  CALCIUM 8.0* 7.8*  AST 19  --   ALT 16*  --   ALKPHOS 61  --   BILITOT 1.6*  --    ------------------------------------------------------------------------------------------------------------------  Cardiac Enzymes  Recent Labs Lab 09/28/16 1247  TROPONINI <0.03   ------------------------------------------------------------------------------------------------------------------  RADIOLOGY:  Ct Abdomen Pelvis W Contrast  Result Date: 09/28/2016 CLINICAL DATA:  Left lower quadrant pain with recent bloody diarrhea, history of ulcerative colitis EXAM: CT ABDOMEN AND PELVIS WITH CONTRAST TECHNIQUE: Multidetector CT imaging of the abdomen and pelvis was performed using the standard protocol following bolus administration of intravenous contrast. CONTRAST:  156m ISOVUE-300 IOPAMIDOL (ISOVUE-300) INJECTION 61% COMPARISON:  10/07/2011 FINDINGS: Lower chest: Lung bases are well visualized. 7 mm stable nodule is noted in the right lower lobe laterally. Minimal scarring is noted in the right middle lobe stable from the prior exam. These changes are benign given their chronicity. Hepatobiliary: The liver is diffusely decreased in attenuation consistent with fatty infiltration.  The gallbladder has been surgically  removed. Pancreas: Unremarkable. No pancreatic ductal dilatation or surrounding inflammatory changes. Spleen: Normal in size without focal abnormality. Adrenals/Urinary Tract: The adrenal glands are within normal limits. The kidneys demonstrate a normal enhancement pattern. No abnormal mass is identified. Delayed images demonstrate normal excretion. Bladder is well distended. Stomach/Bowel: Stomach is within normal limits. Appendix appears normal. Small bowel is within normal limits. There is diffuse wall thickening of the colon from approximately the proximal transverse colon extending distally to the rectum. Diverticular change is seen. Only mild very colonic inflammatory change is seen. Changes may be related to the patient's underlying ulcerative colitis. The possibility of an infectious etiology would deserve consideration as well. Scattered associated mesenteric lymph nodes are noted. Vascular/Lymphatic: Scattered reactive mesenteric lymph nodes. No other significant lymphadenopathy is noted. Aortic atherosclerotic change is seen without aneurysmal dilatation. Reproductive: Prostate is unremarkable. Other: No abdominal wall hernia or abnormality. No abdominopelvic ascites. Musculoskeletal: Degenerative change of the lumbar spine is noted. IMPRESSION: Diffuse colonic wall thickening from the proximal transverse colon to the rectum. Differential includes patient's known ulcerative colitis are possible infectious or ischemic etiologies. Chronic changes as described above. Electronically Signed   By: Inez Catalina M.D.   On: 09/28/2016 16:01   Dg Abd Acute W/chest  Result Date: 09/30/2016 CLINICAL DATA:  Increasing abdominal pain today. EXAM: DG ABDOMEN ACUTE W/ 1V CHEST COMPARISON:  09/28/2016 FINDINGS: Colonic palm prints Ing is present, typical of mural edema associated with colitis. No evidence of bowel obstruction. No extraluminal air. The upright view of the chest is negative for acute cardiopulmonary  abnormality. IMPRESSION: Findings consistent with colitis. No evidence of obstruction or perforation. Electronically Signed   By: Andreas Newport M.D.   On: 09/30/2016 00:06     ASSESSMENT AND PLAN:   David [name]  is a 70 y.o. male with a known history of CAD status post stents, GERD, hypertension, hyperlipidemia and history of diabetes mellitus and ulcerative colitis that was diagnosed about 10 years ago presents to hospital secondary to worsening nausea, vomiting and bloody diarrhea associated with abdominal pain.  #1 acute on chronic ulcerative colitis flare- appreciate GI input and cont. Entocort/Prednisone for now.  - pt. Has previously not tolerated Pentasa in the past. Pt. Has been on Remicade before.   - appreciate GI input and cont. Tolerating full liquid diet and will advance to soft diet.   - cont. Supportive care.  PRN Imodium for diarrhea.   #2 CAD- cont. To hold aspirin with his bloody stools.  - Continue Coreg.  No acute symptoms.   #3 diabetes mellitus-cont. To hold glipizide.  - cont. SSI.   #4 hypothyroidism-continue Synthroid  #5 acute renal failure-prerenal.  - improved w/ hydration and will monitor.   #6 GERD - cont. Protonix.   #7 Hyperlipidemia - cont. Atorvastatin.   All the records are reviewed and case discussed with Care Management/Social Worker. Management plans discussed with the patient, family and they are in agreement.  CODE STATUS: Full code  DVT Prophylaxis: TEd's & SCD's.   TOTAL TIME TAKING CARE OF THIS PATIENT: 25 minutes.   POSSIBLE D/C IN 1-2 DAYS, DEPENDING ON CLINICAL CONDITION.   Henreitta Leber M.D on 09/30/2016 at 3:02 PM  Between 7am to 6pm - Pager - [phone]  After 6pm go to [email or web address] - Proofreader  [facility]  Office  [phone]  CC: Primary care physician; Madelyn Brunner, MD

## 2016-09-30 NOTE — Care Management Important Message (Signed)
Important Message  Patient Details  Name: SHREYANSH TIFFANY MRN: [id number] Date of Birth: 24-Mar-1946   Medicare Important Message Given:       Shelbie Ammons, RN 09/30/2016, 11:55 AM

## 2016-09-30 NOTE — Plan of Care (Signed)
Problem: Pain Managment: Goal: General experience of comfort will improve Outcome: Progressing Tylenol given once for abd pain with improvement.  Problem: Fluid Volume: Goal: Ability to maintain a balanced intake and output will improve Outcome: Progressing Pt tolerates soft diet. No c/o n/v.  Problem: Nutrition: Goal: Adequate nutrition will be maintained Outcome: Progressing As above.  Problem: Bowel/Gastric: Goal: Will not experience complications related to bowel motility Outcome: Progressing 2xbrown liquid stools during the shift.

## 2016-09-30 NOTE — Care Management Important Message (Signed)
Important Message  Patient Details  Name: David [name] MRN: [id number] Date of Birth: 04-Sep-1946   Medicare Important Message Given:  Yes    Shelbie Ammons, RN 09/30/2016, 12:00 PM

## 2016-10-01 ENCOUNTER — Telehealth: Payer: Self-pay

## 2016-10-01 DIAGNOSIS — K529 Noninfective gastroenteritis and colitis, unspecified: Secondary | ICD-10-CM

## 2016-10-01 LAB — BASIC METABOLIC PANEL
ANION GAP: 8 (ref 5–15)
BUN: 13 mg/dL (ref 6–20)
CO2: 25 mmol/L (ref 22–32)
CREATININE: 1.1 mg/dL (ref 0.61–1.24)
Calcium: 8.4 mg/dL — ABNORMAL LOW (ref 8.9–10.3)
Chloride: 105 mmol/L (ref 101–111)
GFR calc non Af Amer: >60 mL/min (ref >60)
Glucose, Bld: 180 mg/dL — ABNORMAL HIGH (ref 65–99)
POTASSIUM: 3.9 mmol/L (ref 3.5–5.1)
SODIUM: 138 mmol/L (ref 135–145)

## 2016-10-01 LAB — GLUCOSE, CAPILLARY
GLUCOSE-CAPILLARY: 143 mg/dL — AB (ref 65–99)
GLUCOSE-CAPILLARY: 334 mg/dL — AB (ref 65–99)
GLUCOSE-CAPILLARY: 295 mg/dL — AB (ref 65–99)
GLUCOSE-CAPILLARY: 272 mg/dL — AB (ref 65–99)

## 2016-10-01 MED ORDER — INSULIN ASPART 100 UNIT/ML ~~LOC~~ SOLN
3.0000 [IU] | Freq: Three times a day (TID) | SUBCUTANEOUS | Status: DC
  Administered 2016-10-01 – 2016-10-02 (×3): 3 [IU] via SUBCUTANEOUS
  Filled 2016-10-01 (×3): qty 3

## 2016-10-01 MED ORDER — INSULIN ASPART 100 UNIT/ML ~~LOC~~ SOLN
0.0000 [IU] | Freq: Three times a day (TID) | SUBCUTANEOUS | Status: DC
  Administered 2016-10-01: 8 [IU] via SUBCUTANEOUS
  Administered 2016-10-02: 08:00:00 3 [IU] via SUBCUTANEOUS
  Administered 2016-10-02: 12:00:00 11 [IU] via SUBCUTANEOUS
  Filled 2016-10-01: qty 8
  Filled 2016-10-01: qty 3

## 2016-10-01 MED ORDER — BUDESONIDE 3 MG PO CPEP: 3.0000 mg | ORAL_CAPSULE | Freq: Every day | ORAL | 1 refills | Status: DC

## 2016-10-01 MED ORDER — INSULIN ASPART 100 UNIT/ML ~~LOC~~ SOLN
0.0000 [IU] | Freq: Every day | SUBCUTANEOUS | Status: DC
  Administered 2016-10-01: 22:00:00 3 [IU] via SUBCUTANEOUS
  Filled 2016-10-01: qty 3

## 2016-10-01 MED ORDER — PREDNISONE 10 MG PO TABS: ORAL_TABLET | ORAL | 0 refills | Status: DC

## 2016-10-01 NOTE — Progress Notes (Signed)
[place] at [place] NAME: Fannie Alomar    MR#:  [id number]  DATE OF BIRTH:  21-Aug-1946  SUBJECTIVE:   Still having some diarrhea but improving.  NO blood in stool. Having some occasional crampy abdominal pan when eating.   REVIEW OF SYSTEMS:    Review of Systems  Constitutional: Negative for chills and fever.  HENT: Negative for congestion and tinnitus.   Eyes: Negative for blurred vision and double vision.  Respiratory: Negative for cough, shortness of breath and wheezing.   Cardiovascular: Negative for chest pain, orthopnea and PND.  Gastrointestinal: Positive for diarrhea, nausea and vomiting. Negative for abdominal pain.  Genitourinary: Negative for dysuria and hematuria.  Neurological: Negative for dizziness, sensory change and focal weakness.  All other systems reviewed and are negative.   Nutrition: soft Tolerating Diet: Yes Tolerating PT: Ambulatory   DRUG ALLERGIES:   Allergies  Allergen Reactions  . Codeine Nausea And Vomiting and Other (See Comments)    VITALS:  Blood pressure (!) 113/53, pulse 91, temperature 99.4 F (37.4 C), temperature source Oral, resp. rate (!) 22, height 5' 11"  (1.803 m), weight 82.2 kg (181 lb 4 oz), SpO2 95 %.  PHYSICAL EXAMINATION:   Physical Exam  GENERAL:  70 y.o.-year-old patient lying in the bed in no acute distress.  EYES: Pupils equal, round, reactive to light and accommodation. No scleral icterus. Extraocular muscles intact.  HEENT: Head atraumatic, normocephalic. Oropharynx and nasopharynx clear.  NECK:  Supple, no jugular venous distention. No thyroid enlargement, no tenderness.  LUNGS: Normal breath sounds bilaterally, no wheezing, rales, rhonchi. No use of accessory muscles of respiration.  CARDIOVASCULAR: S1, S2 normal. No murmurs, rubs, or gallops.  ABDOMEN: Soft, nontender, nondistended. Bowel sounds present. No organomegaly or mass.  EXTREMITIES: No cyanosis, clubbing  or edema b/l.    NEUROLOGIC: Cranial nerves II through XII are intact. No focal Motor or sensory deficits b/l.   PSYCHIATRIC: The patient is alert and oriented x 3.  SKIN: No obvious rash, lesion, or ulcer.    LABORATORY PANEL:   CBC  Recent Labs Lab 09/29/16 0424  WBC 7.8  HGB 12.1*  HCT 33.8*  PLT 227   ------------------------------------------------------------------------------------------------------------------  Chemistries   Recent Labs Lab 09/28/16 1247  10/01/16 0419  NA 132*  < > 138  K 3.8  < > 3.9  CL 100*  < > 105  CO2 22  < > 25  GLUCOSE 223*  < > 180*  BUN 21*  < > 13  CREATININE 1.42*  < > 1.10  CALCIUM 8.0*  < > 8.4*  AST 19  --   --   ALT 16*  --   --   ALKPHOS 61  --   --   BILITOT 1.6*  --   --   < > = values in this interval not displayed. ------------------------------------------------------------------------------------------------------------------  Cardiac Enzymes  Recent Labs Lab 09/28/16 1247  TROPONINI <0.03   ------------------------------------------------------------------------------------------------------------------  RADIOLOGY:  Dg Abd Acute W/chest  Result Date: 09/30/2016 CLINICAL DATA:  Increasing abdominal pain today. EXAM: DG ABDOMEN ACUTE W/ 1V CHEST COMPARISON:  09/28/2016 FINDINGS: Colonic palm prints Ing is present, typical of mural edema associated with colitis. No evidence of bowel obstruction. No extraluminal air. The upright view of the chest is negative for acute cardiopulmonary abnormality. IMPRESSION: Findings consistent with colitis. No evidence of obstruction or perforation. Electronically Signed   By: Andreas Newport M.D.   On: 09/30/2016 00:06  ASSESSMENT AND PLAN:   Terance Pomplun  is a 70 y.o. male with a known history of CAD status post stents, GERD, hypertension, hyperlipidemia and history of diabetes mellitus and ulcerative colitis that was diagnosed about 10 years ago presents to hospital  secondary to worsening nausea, vomiting and bloody diarrhea associated with abdominal pain.  #1 acute on chronic ulcerative colitis flare- appreciate GI input and cont. Prednisone for now.  - pt. Has previously not tolerated Pentasa in the past. Pt. Has been on Remicade before.   - appreciate GI input and cont. Tolerating soft diet well but still having some loose stools.   - cont. PRN Imodium for diarrhea.   #2 CAD- cont. To hold aspirin with his bloody stools.  - Continue Coreg.  No acute symptoms.   #3 diabetes mellitus- BS a bit labile and will start on Novolog with meals and SSI. Appreciate diabetes coordinator input.    #4 hypothyroidism-continue Synthroid  #5 acute renal failure-prerenal.  - improved w/ hydration and will monitor.   #6 GERD - cont. Protonix.   #7 Hyperlipidemia - cont. Atorvastatin.   D/c home tomorrow if diarrhea improving. Discussed w/ GI.   All the records are reviewed and case discussed with Care Management/Social Worker. Management plans discussed with the patient, family and they are in agreement.  CODE STATUS: Full code  DVT Prophylaxis: TEd's & SCD's.   TOTAL TIME TAKING CARE OF THIS PATIENT: 30 minutes.   POSSIBLE D/C IN 1-2 DAYS, DEPENDING ON CLINICAL CONDITION.   Henreitta Leber M.D on 10/01/2016 at 3:03 PM  Between 7am to 6pm - Pager - [phone]  After 6pm go to [email or web address] - Proofreader  [facility]  Office  [phone]  CC: Primary care physician; Madelyn Brunner, MD

## 2016-10-01 NOTE — Progress Notes (Signed)
Inpatient Diabetes Program Recommendations  AACE/ADA: New Consensus Statement on Inpatient Glycemic Control (2015)  Target Ranges:  Prepandial:   less than 140 mg/dL      Peak postprandial:   less than 180 mg/dL (1-2 hours)      Critically ill patients:  140 - 180 mg/dL   Lab Results  Component Value Date   GLUCAP 334 (H) 10/01/2016    Review of Glycemic Control  Results for TIMUR, NIBERT (MRN [id number]) as of 10/01/2016 14:39  Ref. Range 09/30/2016 11:48 09/30/2016 16:55 09/30/2016 20:56 10/01/2016 07:43 10/01/2016 11:30  Glucose-Capillary Latest Ref Range: 65 - 99 mg/dL 307 (H) 297 (H) 258 (H) 143 (H) 334 (H)    Diabetes history: Type 2 Home meds: Glipizide 33m bid  Current orders for Inpatient glycemic control: Novolog 0-9 units tid, Novolog 0-5 units qhs  * steroids 470mqam  Inpatient Diabetes Program Recommendations:    Per ADA recommendations "consider performing an A1C on all patients with diabetes or hyperglycemia admitted to the hospital if not performed in the prior 3 months".  Consider adding Novolog 3 units with meals (hold if he eats less than 50%) and increase Novolog correction to moderate correction 0-15 units tid.  JuGentry FitzRN, BA, MHA, CDE Diabetes Coordinator Inpatient Diabetes Program  [phone] Pager) [phone]/21/2017 2:44 PM

## 2016-10-01 NOTE — Telephone Encounter (Signed)
Noted  

## 2016-10-01 NOTE — Plan of Care (Signed)
Problem: Pain Managment: Goal: General experience of comfort will improve Outcome: Progressing Pt able to sleep most of the night with use of benadryl and oral pain pill.  Decreased diarrhea this shift. Imodium x 1.  Problem: Fluid Volume: Goal: Ability to maintain a balanced intake and output will improve Outcome: Progressing Pt reports able to tolerate soft diet

## 2016-10-01 NOTE — Telephone Encounter (Signed)
-----   Message from [name], MD sent at 10/01/2016 10:06 AM EST ----- Pt is in hospital. I have discussed results with him , will need OP appt in 1-2 weeks from discharge

## 2016-10-01 NOTE — Progress Notes (Signed)
[name] MD [address] [place], [place] [address] Phone: [phone] Fax : [phone]  David [name] is being followed for ulcerative colitis  Day 3 of follow up   Subjective: Some abdominal cramping which is getting better No rectal bleeding-resolved Still has diarrhea but says stool is more formed.    Objective: Vital signs in last 24 hours: Vitals:   09/30/16 1749 09/30/16 2056 10/01/16 0451 10/01/16 0500  BP: (!) 145/68 (!) 142/71 123/61   Pulse: 84 82 77   Resp: _0 Temp:   98.6 F (37 C)   TempSrc:   Oral   SpO2: 96% 95% 97%   Weight:    181 lb 4 oz (82.2 kg)  Height:       Weight change: -2 lb (-0.907 kg)  Intake/Output Summary (Last 24 hours) at 10/01/16 0909 Last data filed at 09/30/16 1408  Gross per 24 hour  Intake             1550 ml  Output                0 ml  Net             1550 ml     Exam: Heart:: S1S2 present Lungs: normal Abdomen: soft, nontender, normal bowel sounds   Lab Results: CBC Latest Ref Rng & Units 09/29/2016 09/28/2016 09/22/2016  WBC 3.8 - 10.6 K/uL 7.8 8.1 11.0(H)  Hemoglobin 13.0 - 18.0 g/dL 12.1(L) 12.6(L) 13.9  Hematocrit 40.0 - 52.0 % 33.8(L) 35.2(L) 39.8(L)  Platelets 150 - 440 K/uL 227 246 233   BMP Latest Ref Rng & Units 10/01/2016 09/29/2016 09/28/2016  Glucose 65 - 99 mg/dL 180(H) 170(H) 223(H)  BUN 6 - 20 mg/dL 13 15 21(H)  Creatinine 0.61 - 1.24 mg/dL 1.10 1.00 1.42(H)  Sodium 135 - 145 mmol/L 138 133(L) 132(L)  Potassium 3.5 - 5.1 mmol/L 3.9 3.7 3.8  Chloride 101 - 111 mmol/L 105 103 100(L)  CO2 22 - 32 mmol/L _1 Calcium 8.9 - 10.3 mg/dL 8.4(L) 7.8(L) 8.0(L)    Hepatic Function Latest Ref Rng & Units 09/28/2016  Total Protein 6.5 - 8.1 g/dL 6.0(L)  Albumin 3.5 - 5.0 g/dL 2.9(L)  AST 15 - 41 U/L 19  ALT 17 - 63 U/L 16(L)  Alk Phosphatase 38 - 126 U/L 61  Total Bilirubin 0.3 - 1.2 mg/dL 1.6(H)     Micro Results: Recent Results (from the past 240 hour(s))  C difficile  quick screen w PCR reflex     Status: None   Collection Time: 09/22/16  3:30 PM  Result Value Ref Range Status   C Diff antigen NEGATIVE NEGATIVE Final   C Diff toxin NEGATIVE NEGATIVE Final   C Diff interpretation No C. difficile detected.  Final  Gastrointestinal Panel by PCR , Stool     Status: None   Collection Time: 09/22/16  3:30 PM  Result Value Ref Range Status   Campylobacter species NOT DETECTED NOT DETECTED Final   Plesimonas shigelloides NOT DETECTED NOT DETECTED Final   Salmonella species NOT DETECTED NOT DETECTED Final   Yersinia enterocolitica NOT DETECTED NOT DETECTED Final   Vibrio species NOT DETECTED NOT DETECTED Final   Vibrio cholerae NOT DETECTED NOT DETECTED Final   Enteroaggregative E coli (EAEC) NOT DETECTED NOT DETECTED Final   Enteropathogenic E coli (EPEC) NOT DETECTED NOT DETECTED Final   Enterotoxigenic E coli (ETEC) NOT DETECTED NOT DETECTED Final  Shiga like toxin producing E coli (STEC) NOT DETECTED NOT DETECTED Final   Shigella/Enteroinvasive E coli (EIEC) NOT DETECTED NOT DETECTED Final   Cryptosporidium NOT DETECTED NOT DETECTED Final   Cyclospora cayetanensis NOT DETECTED NOT DETECTED Final   Entamoeba histolytica NOT DETECTED NOT DETECTED Final   Giardia lamblia NOT DETECTED NOT DETECTED Final   Adenovirus F40/41 NOT DETECTED NOT DETECTED Final   Astrovirus NOT DETECTED NOT DETECTED Final   Norovirus GI/GII NOT DETECTED NOT DETECTED Final   Rotavirus A NOT DETECTED NOT DETECTED Final   Sapovirus (I, II, IV, and V) NOT DETECTED NOT DETECTED Final  Gastrointestinal Panel by PCR , Stool     Status: None   Collection Time: 09/22/16  5:00 PM  Result Value Ref Range Status   Campylobacter species NOT DETECTED NOT DETECTED Final   Plesimonas shigelloides NOT DETECTED NOT DETECTED Final   Salmonella species NOT DETECTED NOT DETECTED Final   Yersinia enterocolitica NOT DETECTED NOT DETECTED Final   Vibrio species NOT DETECTED NOT DETECTED Final    Vibrio cholerae NOT DETECTED NOT DETECTED Final   Enteroaggregative E coli (EAEC) NOT DETECTED NOT DETECTED Final   Enteropathogenic E coli (EPEC) NOT DETECTED NOT DETECTED Final   Enterotoxigenic E coli (ETEC) NOT DETECTED NOT DETECTED Final   Shiga like toxin producing E coli (STEC) NOT DETECTED NOT DETECTED Final   Shigella/Enteroinvasive E coli (EIEC) NOT DETECTED NOT DETECTED Final   Cryptosporidium NOT DETECTED NOT DETECTED Final   Cyclospora cayetanensis NOT DETECTED NOT DETECTED Final   Entamoeba histolytica NOT DETECTED NOT DETECTED Final   Giardia lamblia NOT DETECTED NOT DETECTED Final   Adenovirus F40/41 NOT DETECTED NOT DETECTED Final   Astrovirus NOT DETECTED NOT DETECTED Final   Norovirus GI/GII NOT DETECTED NOT DETECTED Final   Rotavirus A NOT DETECTED NOT DETECTED Final   Sapovirus (I, II, IV, and V) NOT DETECTED NOT DETECTED Final  C difficile quick scan w PCR reflex     Status: None   Collection Time: 09/28/16  1:27 PM  Result Value Ref Range Status   C Diff antigen NEGATIVE NEGATIVE Final   C Diff toxin NEGATIVE NEGATIVE Final   C Diff interpretation No C. difficile detected.  Final   Studies/Results: Dg Abd Acute W/chest  Result Date: 09/30/2016 CLINICAL DATA:  Increasing abdominal pain today. EXAM: DG ABDOMEN ACUTE W/ 1V CHEST COMPARISON:  09/28/2016 FINDINGS: Colonic palm prints Ing is present, typical of mural edema associated with colitis. No evidence of bowel obstruction. No extraluminal air. The upright view of the chest is negative for acute cardiopulmonary abnormality. IMPRESSION: Findings consistent with colitis. No evidence of obstruction or perforation. Electronically Signed   By: Andreas Newport M.D.   On: 09/30/2016 00:06   Medications: I have reviewed the patient's current medications. Scheduled Meds: . atorvastatin  40 mg Oral q1800  . budesonide  3 mg Oral Daily  . carvedilol  3.125 mg Oral BID WC  . feeding supplement (ENSURE ENLIVE)  237 mL  Oral QID  . insulin aspart  0-5 Units Subcutaneous QHS  . insulin aspart  0-9 Units Subcutaneous TID WC  . levothyroxine  50 mcg Oral QAC breakfast  . pantoprazole  40 mg Oral Daily  . predniSONE  40 mg Oral QAC breakfast   Continuous Infusions: PRN Meds:.acetaminophen **OR** acetaminophen, alum & mag hydroxide-simeth, diphenhydrAMINE, guaiFENesin, HYDROcodone-acetaminophen, loperamide, ondansetron **OR** ondansetron (ZOFRAN) IV, sodium chloride   Assessment: Active Problems:   Colitis   Ulcerative colitis with  complication (HCC)   Lower GI bleed   Protein-calorie malnutrition, severe  David [name] is here admitted when he developed a reaction to mesalamine. He has a history of ulcerative colitis for over 10 years , has not been on any medications for a while,I saw him in the outpatient recently and felt he was having a flare, a sigmoidoscopy confirmed the same with biopsies (HSV and CMV stains were negative). Since coming into hospital he has been started on Budesonide and prednisone and his rectal bleeding has resolved   Plan: 1. Can d/c budesonide and continue PO prednisone 40 mg . No additional  benefit of using two steroids.   2. At discharge plan to decrease by 5 mg every week   3. Will need outpatient GI follow up  in 1-2 weeks  to discuss treatment options moving forward, he will need an immunosuppressant long term to keep him in remission.   4. Absolutely no NSAID's in the future.   5. Can go home this evening if he feels diarrhea is under control and can manage at home other wise can plan for d/c tomorrow    LOS: 3 days   [name] 10/01/2016, 9:09 AM

## 2016-10-02 LAB — HEMOGLOBIN A1C
HEMOGLOBIN A1C: 7.6 % — AB (ref 4.8–5.6)
MEAN PLASMA GLUCOSE: 171 mg/dL

## 2016-10-02 LAB — GLUCOSE, CAPILLARY
GLUCOSE-CAPILLARY: 339 mg/dL — AB (ref 65–99)
Glucose-Capillary: 168 mg/dL — ABNORMAL HIGH (ref 65–99)

## 2016-10-02 MED ORDER — ENSURE ENLIVE PO LIQD
237.0000 mL | Freq: Two times a day (BID) | ORAL | Status: DC
  Administered 2016-10-02: 14:00:00 237 mL via ORAL

## 2016-10-02 NOTE — Progress Notes (Signed)
Nutrition Follow-up  DOCUMENTATION CODES:   Severe malnutrition in context of acute illness/injury  INTERVENTION:  Will decrease to Ensure Enlive po BID, each supplement provides 350 kcal and 20 grams of protein.  Reviewed "IBD/Crohn's Disease Nutrition Therapy" Handout from the Academy of [employer] and Dietetics. Reviewed foods recommended/not recommended during an acute flare. Encouraged small, frequent meals during flares to ensure adequate intake and absorption. Also provided patient with symptom tracker so he can identify foods that may exacerbate symptoms.  NUTRITION DIAGNOSIS:   Inadequate oral intake related to poor appetite, nausea, other (see comment) (diarrhea, abdominal pain) as evidenced by per patient/family report, percent weight loss.  Progressing.  GOAL:   Patient will meet greater than or equal to 90% of their needs  Met.  MONITOR:   PO intake, Supplement acceptance, Diet advancement, Labs, Weight trends, I & O's  REASON FOR ASSESSMENT:   Malnutrition Screening Tool    ASSESSMENT:   70 y.o. male with a known history of CAD status post stents, GERD, hypertension, hyperlipidemia and history of diabetes mellitus and ulcerative colitis that was diagnosed about 10 years ago presents to hospital secondary to worsening nausea, vomiting and bloody diarrhea associated with abdominal pain.  -Patient was advanced to Soft Diet on 12/20.  Spoke with patient at bedside. He has a good appetite now. He is still experiencing diarrhea but reports it seems to be improved today. Endorses pain/cramping from gas. Denies nausea. Patient enjoys the Ensure and reports he drank 3 yesterday.   Meal Completion: 30-100% In the past 24 hours patient has had approximately 2526 kcal (>100% estimated kcal needs) and 96 grams of protein (96% minimum estimated protein needs).  Medications reviewed and include: Novolog sliding scale TID with meals and daily at bedtime, levothyroxine,  pantoprazole.  Labs reviewed: CBG 143-334 past 24 hrs, HgbA1c 7.6.   Diet Order:  DIET SOFT Room service appropriate? Yes; Fluid consistency: Thin Diet - low sodium heart healthy  Skin:  Wound (see comment) (MSAD to anus)  Last BM:  10/01/2016  Height:   Ht Readings from Last 1 Encounters:  10/02/16 5' 11"  (1.803 m)    Weight:   Wt Readings from Last 1 Encounters:  10/01/16 181 lb 4 oz (82.2 kg)    Ideal Body Weight:  78.2 kg  BMI:  Body mass index is 25.28 kg/m.  Estimated Nutritional Needs:   Kcal:  1950-2115 (MSJ x 1.2-1.3)  Protein:  100-125 grams (1.2-1.5 grams/kg)  Fluid:  >/= 2 L/day (25 ml/kg)  EDUCATION NEEDS:   Education needs no appropriate at this time  Willey Blade, MS, RD, LDN Pager: [phone] After Hours Pager: [phone]

## 2016-10-02 NOTE — Discharge Summary (Signed)
[facility] [place] NAME: David [name]    MR#:  [id number]  DATE OF BIRTH:  11-Apr-1946  DATE OF ADMISSION:  09/28/2016 ADMITTING PHYSICIAN: Gladstone Lighter, MD  DATE OF DISCHARGE: 10/02/2016  PRIMARY CARE PHYSICIAN: Madelyn Brunner, MD    ADMISSION DIAGNOSIS:  Lower GI bleed [K92.2] Ulcerative colitis with complication, unspecified location ([place]) [K51.919]  DISCHARGE DIAGNOSIS:  Active Problems:   Colitis   Ulcerative colitis with complication (HCC)   Lower GI bleed   Protein-calorie malnutrition, severe   SECONDARY DIAGNOSIS:   Past Medical History:  Diagnosis Date  . Anemia   . CAD (coronary artery disease) 07/08/2011   Overview:  a. 07/2006: Anterior ST elevation MI. b. 07/2006: PCI with BMS to LAD and RCA. c. 09/2006: Non ST elevation. d. LVEF 30%.  Discussed ICD, not currently interested.   . 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 Dyslipidemia 07/30/2011   Overview:  High triglycerides  . GERD (gastroesophageal reflux disease) 07/08/2011  . History of Clostridium difficile colitis   . History of myocardial infarction   . Hyperlipidemia, unspecified 07/08/2011  . Hypothyroidism   . 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 Type II diabetes mellitus, uncontrolled ([place]) 07/08/2011  . Vitamin D deficiency     HOSPITAL COURSE:   David [name] a 70 y.o. malewith a known history of CAD status post stents, GERD, hypertension, hyperlipidemia and history of diabetes mellitus and ulcerative colitis that was diagnosed about 10 years ago presents to hospital secondary to worsening nausea, vomiting and bloody diarrhea associated with abdominal pain.  #1 acute on chronic ulcerative colitis flare- Patient was seen by gastroenterology and started on oral steroids. -Patient has previously not tolerated Pentasa but has been on Remicade which did help the patient. After  getting some steroids patient's abdominal pain and diarrhea has improved. His diarrhea is no longer bloody. He did receive some as needed Imodium which seems to help his diarrhea. -His diet has been slowly advanced from clear to a regular diet which she is not tolerating without any worsening abdominal pain or diarrhea. He is being discharged on a prednisone taper with close follow-up with GI as an outpatient.  #2 CAD- patient had some bloody stools and his aspirin was held but he will resume that upon discharge. - he will Continue Coreg, Lisinopril.    #3 diabetes mellitus- BS were a bit high due to steroids.  Pt. Will resume Glipizide upon discharge.   #4 hypothyroidism- he will continue Synthroid  #5 acute renal failure-prerenal due to diarrhea. This improved and resolved w/ some IV fluids.   #6 GERD - pt. Will resume Omeprazole. .   #7 Hyperlipidemia - pt. Will cont. Atorvastatin.   DISCHARGE CONDITIONS:   Stable.   CONSULTS OBTAINED:  Treatment Team:  Lucilla Lame, MD  DRUG ALLERGIES:   Allergies  Allergen Reactions  . Codeine Nausea And Vomiting and Other (See Comments)    DISCHARGE MEDICATIONS:   Allergies as of 10/02/2016      Reactions   Codeine Nausea And Vomiting, Other (See Comments)      Medication List    STOP taking these medications   mesalamine 4 g enema Commonly known as:  ROWASA   mesalamine 500 MG CR capsule Commonly known as:  PENTASA     TAKE these medications   aspirin EC 81 MG tablet Take 81 mg by mouth 2 (two) times daily.  atorvastatin 40 MG tablet Commonly known as:  LIPITOR Take 40 mg by mouth daily.   carvedilol 3.125 MG tablet Commonly known as:  COREG Take 3.125 mg by mouth 2 (two) times daily with a meal.   FISH OIL PO Take 1 capsule by mouth 2 (two) times daily.   glipiZIDE 10 MG tablet Commonly known as:  GLUCOTROL Take 10 mg by mouth 2 (two) times daily.   levothyroxine 50 MCG tablet Commonly known as:   SYNTHROID, LEVOTHROID Take 50 mcg by mouth daily before breakfast.   MULTIVITAMIN ADULT PO Take 1 tablet by mouth daily.   omeprazole 20 MG capsule Commonly known as:  PRILOSEC Take 20 mg by mouth daily.   predniSONE 10 MG tablet Commonly known as:  DELTASONE 4 tabs X 7 days, then 3 tabs X 7 days, then 2 tabs X 7 days, then 1 tabs X 7 days and then STOP   ramipril 5 MG capsule Commonly known as:  ALTACE Take 5 mg by mouth daily.   Vitamin D 2000 units Caps Take 1 capsule by mouth daily.         DISCHARGE INSTRUCTIONS:   DIET:  Cardiac diet and Diabetic diet  DISCHARGE CONDITION:  Stable  ACTIVITY:  Activity as tolerated  OXYGEN:  Home Oxygen: No.   Oxygen Delivery: room air  DISCHARGE LOCATION:  home   If you experience worsening of your admission symptoms, develop shortness of breath, life threatening emergency, suicidal or homicidal thoughts you must seek medical attention immediately by calling 911 or calling your MD immediately  if symptoms less severe.  You Must read complete instructions/literature along with all the possible adverse reactions/side effects for all the Medicines you take and that have been prescribed to you. Take any new Medicines after you have completely understood and accpet all the possible adverse reactions/side effects.   Please note  You were cared for by a hospitalist during your hospital stay. If you have any questions about your discharge medications or the care you received while you were in the hospital after you are discharged, you can call the unit and asked to speak with the hospitalist on call if the hospitalist that took care of you is not available. Once you are discharged, your primary care physician will handle any further medical issues. Please note that NO REFILLS for any discharge medications will be authorized once you are discharged, as it is imperative that you return to your primary care physician (or establish a  relationship with a primary care physician if you do not have one) for your aftercare needs so that they can reassess your need for medications and monitor your lab values.     Today   Diarrhea much improved.  NO further abd. Pain. Tolerating regular diet well.   VITAL SIGNS:  Blood pressure 122/64, pulse 87, temperature 98.1 F (36.7 C), temperature source Oral, resp. rate 16, height 5' 11"  (1.803 m), weight 82.2 kg (181 lb 4 oz), SpO2 98 %.  I/O:   Intake/Output Summary (Last 24 hours) at 10/02/16 1358 Last data filed at 10/02/16 1349  Gross per 24 hour  Intake             1680 ml  Output                0 ml  Net             1680 ml    PHYSICAL EXAMINATION:  GENERAL:  70 y.o.-year-old patient  lying in the bed with no acute distress.  EYES: Pupils equal, round, reactive to light and accommodation. No scleral icterus. Extraocular muscles intact.  HEENT: Head atraumatic, normocephalic. Oropharynx and nasopharynx clear.  NECK:  Supple, no jugular venous distention. No thyroid enlargement, no tenderness.  LUNGS: Normal breath sounds bilaterally, no wheezing, rales,rhonchi. No use of accessory muscles of respiration.  CARDIOVASCULAR: S1, S2 normal. No murmurs, rubs, or gallops.  ABDOMEN: Soft, non-tender, non-distended. Bowel sounds present. No organomegaly or mass.  EXTREMITIES: No pedal edema, cyanosis, or clubbing.  NEUROLOGIC: Cranial nerves II through XII are intact. No focal motor or sensory defecits b/l.  PSYCHIATRIC: The patient is alert and oriented x 3. Good affect.  SKIN: No obvious rash, lesion, or ulcer.   DATA REVIEW:   CBC  Recent Labs Lab 09/29/16 0424  WBC 7.8  HGB 12.1*  HCT 33.8*  PLT 227    Chemistries   Recent Labs Lab 09/28/16 1247  10/01/16 0419  NA 132*  < > 138  K 3.8  < > 3.9  CL 100*  < > 105  CO2 22  < > 25  GLUCOSE 223*  < > 180*  BUN 21*  < > 13  CREATININE 1.42*  < > 1.10  CALCIUM 8.0*  < > 8.4*  AST 19  --   --   ALT 16*  --    --   ALKPHOS 61  --   --   BILITOT 1.6*  --   --   < > = values in this interval not displayed.  Cardiac Enzymes  Recent Labs Lab 09/28/16 1247  TROPONINI <0.03    Microbiology Results  Results for orders placed or performed during the hospital encounter of 09/28/16  C difficile quick scan w PCR reflex     Status: None   Collection Time: 09/28/16  1:27 PM  Result Value Ref Range Status   C Diff antigen NEGATIVE NEGATIVE Final   C Diff toxin NEGATIVE NEGATIVE Final   C Diff interpretation No C. difficile detected.  Final    RADIOLOGY:  No results found.    Management plans discussed with the patient, family and they are in agreement.  CODE STATUS:     Code Status Orders        Start     Ordered   09/28/16 1845  Full code  Continuous     09/28/16 1844    Code Status History    Date Active Date Inactive Code Status Order ID Comments User Context   This patient has a current code status but no historical code status.    Advance Directive Documentation   Flowsheet Row Most Recent Value  Type of Advance Directive  Healthcare Power of Attorney, Living will  Pre-existing out of facility DNR order (yellow form or pink MOST form)  No data  "MOST" Form in Place?  No data      TOTAL TIME TAKING CARE OF THIS PATIENT: 40 minutes.    Henreitta Leber M.D on 10/02/2016 at 1:58 PM  Between 7am to 6pm - Pager - [phone]  After 6pm go to [email or web address] - Proofreader  [facility]  Office  [phone]  CC: Primary care physician; Madelyn Brunner, MD

## 2016-10-02 NOTE — Progress Notes (Signed)
Inpatient Diabetes Program Recommendations  AACE/ADA: New Consensus Statement on Inpatient Glycemic Control (2015)  Target Ranges:  Prepandial:   less than 140 mg/dL      Peak postprandial:   less than 180 mg/dL (1-2 hours)      Critically ill patients:  140 - 180 mg/dL   Lab Results  Component Value Date   GLUCAP 168 (H) 10/02/2016   HGBA1C 7.6 (H) 10/01/2016    Review of Glycemic Control  Results for David [name], David [name] (MRN [id number]) as of 10/02/2016 10:08  Ref. Range 10/01/2016 07:43 10/01/2016 11:30 10/01/2016 17:08 10/01/2016 21:11 10/02/2016 07:21  Glucose-Capillary Latest Ref Range: 65 - 99 mg/dL 143 (H) 334 (H) 295 (H) 272 (H) 168 (H)    Diabetes history: Type 2 Home meds: Glipizide 45m bid  Current orders for Inpatient glycemic control: Novolog 0-15 units tid, Novolog 0-5 units qhs, Novolog 3 units tid with meals  * steroids 450mqam  Inpatient Diabetes Program Recommendations:  Some improvement with adding mealtime insulin.    Consider increasing Novolog to 5 units with meals (hold if he eats less than 50%) - decrease as steroids are tapered.  JuGentry FitzRN, BA, MHA, CDE Diabetes Coordinator Inpatient Diabetes Program  [phone] Pager) [phone]/22/2017 10:09 AM

## 2016-10-02 NOTE — Progress Notes (Signed)
Received MD order to discharge patient to home, reviewed home med, prescriptions and discharge instructions with patient and patient verbalized understanding discharged in wheelchair with wife and auxiliary staff

## 2016-10-06 ENCOUNTER — Ambulatory Visit (INDEPENDENT_AMBULATORY_CARE_PROVIDER_SITE_OTHER): Payer: Medicare Other | Admitting: Gastroenterology

## 2016-10-06 ENCOUNTER — Encounter: Payer: Self-pay | Admitting: Gastroenterology

## 2016-10-06 ENCOUNTER — Inpatient Hospital Stay
Admission: EM | Admit: 2016-10-06 | Discharge: 2016-10-08 | DRG: 387 | Disposition: A | Discharge status: Home / Self Care | Payer: Medicare Other | Attending: Internal Medicine | Admitting: Internal Medicine

## 2016-10-06 VITALS — BP 107/64 | HR 88 | Temp 97.8°F | Ht 72.0 in | Wt 183.0 lb

## 2016-10-06 DIAGNOSIS — E559 Vitamin D deficiency, unspecified: Secondary | ICD-10-CM | POA: Diagnosis present

## 2016-10-06 DIAGNOSIS — K529 Noninfective gastroenteritis and colitis, unspecified: Secondary | ICD-10-CM

## 2016-10-06 DIAGNOSIS — Z955 Presence of coronary angioplasty implant and graft: Secondary | ICD-10-CM

## 2016-10-06 DIAGNOSIS — K5100 Ulcerative (chronic) pancolitis without complications: Secondary | ICD-10-CM | POA: Diagnosis not present

## 2016-10-06 DIAGNOSIS — K219 Gastro-esophageal reflux disease without esophagitis: Secondary | ICD-10-CM | POA: Diagnosis present

## 2016-10-06 DIAGNOSIS — K51911 Ulcerative colitis, unspecified with rectal bleeding: Principal | ICD-10-CM | POA: Diagnosis present

## 2016-10-06 DIAGNOSIS — I252 Old myocardial infarction: Secondary | ICD-10-CM

## 2016-10-06 DIAGNOSIS — E876 Hypokalemia: Secondary | ICD-10-CM | POA: Diagnosis present

## 2016-10-06 DIAGNOSIS — Z7984 Long term (current) use of oral hypoglycemic drugs: Secondary | ICD-10-CM | POA: Diagnosis not present

## 2016-10-06 DIAGNOSIS — E119 Type 2 diabetes mellitus without complications: Secondary | ICD-10-CM | POA: Diagnosis present

## 2016-10-06 DIAGNOSIS — Z8249 Family history of ischemic heart disease and other diseases of the circulatory system: Secondary | ICD-10-CM | POA: Diagnosis not present

## 2016-10-06 DIAGNOSIS — Z7982 Long term (current) use of aspirin: Secondary | ICD-10-CM | POA: Diagnosis not present

## 2016-10-06 DIAGNOSIS — T380X5A Adverse effect of glucocorticoids and synthetic analogues, initial encounter: Secondary | ICD-10-CM | POA: Diagnosis present

## 2016-10-06 DIAGNOSIS — I2510 Atherosclerotic heart disease of native coronary artery without angina pectoris: Secondary | ICD-10-CM | POA: Diagnosis present

## 2016-10-06 DIAGNOSIS — Z79899 Other long term (current) drug therapy: Secondary | ICD-10-CM

## 2016-10-06 DIAGNOSIS — E785 Hyperlipidemia, unspecified: Secondary | ICD-10-CM | POA: Diagnosis present

## 2016-10-06 DIAGNOSIS — Z885 Allergy status to narcotic agent status: Secondary | ICD-10-CM

## 2016-10-06 DIAGNOSIS — K51511 Left sided colitis with rectal bleeding: Secondary | ICD-10-CM | POA: Diagnosis present

## 2016-10-06 LAB — PROTIME-INR
INR: 1.2
PROTHROMBIN TIME: 15.3 s — AB (ref 11.4–15.2)

## 2016-10-06 LAB — URINALYSIS, COMPLETE (UACMP) WITH MICROSCOPIC
Bacteria, UA: NONE SEEN
Bilirubin Urine: NEGATIVE
HGB URINE DIPSTICK: NEGATIVE
Ketones, ur: 20 mg/dL — AB
Leukocytes, UA: NEGATIVE
Nitrite: NEGATIVE
Protein, ur: NEGATIVE mg/dL
SPECIFIC GRAVITY, URINE: 1.029 (ref 1.005–1.030)
Squamous Epithelial / LPF: NONE SEEN
pH: 5 (ref 5.0–8.0)

## 2016-10-06 LAB — CBC
HEMATOCRIT: 30.1 % — AB (ref 40.0–52.0)
Hemoglobin: 10.5 g/dL — ABNORMAL LOW (ref 13.0–18.0)
MCH: 31.1 pg (ref 26.0–34.0)
MCHC: 34.8 g/dL (ref 32.0–36.0)
MCV: 89.2 fL (ref 80.0–100.0)
Platelets: 315 10*3/uL (ref 150–440)
RBC: 3.38 MIL/uL — ABNORMAL LOW (ref 4.40–5.90)
RDW: 13.7 % (ref 11.5–14.5)
WBC: 5.1 10*3/uL (ref 3.8–10.6)

## 2016-10-06 LAB — GASTROINTESTINAL PANEL BY PCR, STOOL (REPLACES STOOL CULTURE)

## 2016-10-06 LAB — C DIFFICILE QUICK SCREEN W PCR REFLEX
C Diff antigen: NEGATIVE
C Diff interpretation: NOT DETECTED
C Diff toxin: NEGATIVE

## 2016-10-06 LAB — COMPREHENSIVE METABOLIC PANEL
ALT: 13 U/L — ABNORMAL LOW (ref 17–63)
ANION GAP: 3 — AB (ref 5–15)
AST: 12 U/L — ABNORMAL LOW (ref 15–41)
Albumin: 1.8 g/dL — ABNORMAL LOW (ref 3.5–5.0)
Alkaline Phosphatase: 43 U/L (ref 38–126)
BILIRUBIN TOTAL: 0.7 mg/dL (ref 0.3–1.2)
BUN: 10 mg/dL (ref 6–20)
CO2: 20 mmol/L — ABNORMAL LOW (ref 22–32)
Calcium: 5.5 mg/dL — CL (ref 8.9–10.3)
Chloride: 116 mmol/L — ABNORMAL HIGH (ref 101–111)
Creatinine, Ser: 0.6 mg/dL — ABNORMAL LOW (ref 0.61–1.24)
GFR calc Af Amer: >60 mL/min (ref >60)
Glucose, Bld: 236 mg/dL — ABNORMAL HIGH (ref 65–99)
POTASSIUM: 2.9 mmol/L — AB (ref 3.5–5.1)
Sodium: 139 mmol/L (ref 135–145)
TOTAL PROTEIN: 4.2 g/dL — AB (ref 6.5–8.1)

## 2016-10-06 LAB — MAGNESIUM: Magnesium: 2.2 mg/dL (ref 1.7–2.4)

## 2016-10-06 LAB — TYPE AND SCREEN
ABO/RH(D): A POS
Antibody Screen: NEGATIVE

## 2016-10-06 LAB — APTT: APTT: 28 s (ref 24–36)

## 2016-10-06 LAB — C-REACTIVE PROTEIN: CRP: 2.6 mg/dL — ABNORMAL HIGH (ref <1.0)

## 2016-10-06 LAB — POTASSIUM: POTASSIUM: 4.7 mmol/L (ref 3.5–5.1)

## 2016-10-06 MED ORDER — POTASSIUM CHLORIDE CRYS ER 20 MEQ PO TBCR
20.0000 meq | EXTENDED_RELEASE_TABLET | Freq: Once | ORAL | Status: AC
  Administered 2016-10-06: 20 meq via ORAL
  Filled 2016-10-06: qty 1

## 2016-10-06 MED ORDER — ASPIRIN EC 81 MG PO TBEC: 81.0000 mg | DELAYED_RELEASE_TABLET | Freq: Two times a day (BID) | ORAL | Status: DC

## 2016-10-06 MED ORDER — POTASSIUM CHLORIDE CRYS ER 20 MEQ PO TBCR
EXTENDED_RELEASE_TABLET | ORAL | Status: AC
  Filled 2016-10-06: qty 2

## 2016-10-06 MED ORDER — SODIUM CHLORIDE 0.9 % IV BOLUS (SEPSIS)
500.0000 mL | Freq: Once | INTRAVENOUS | Status: AC
  Administered 2016-10-06: 500 mL via INTRAVENOUS

## 2016-10-06 MED ORDER — RAMIPRIL 5 MG PO CAPS
5.0000 mg | ORAL_CAPSULE | Freq: Every day | ORAL | Status: DC
  Administered 2016-10-07 – 2016-10-08 (×2): 5 mg via ORAL
  Filled 2016-10-06 (×2): qty 1

## 2016-10-06 MED ORDER — POTASSIUM CHLORIDE CRYS ER 20 MEQ PO TBCR
40.0000 meq | EXTENDED_RELEASE_TABLET | Freq: Once | ORAL | Status: AC
  Administered 2016-10-06: 40 meq via ORAL

## 2016-10-06 MED ORDER — MESALAMINE 4 G RE ENEM
4.0000 g | ENEMA | Freq: Every day | RECTAL | Status: DC
  Filled 2016-10-06: qty 60

## 2016-10-06 MED ORDER — ATORVASTATIN CALCIUM 20 MG PO TABS
40.0000 mg | ORAL_TABLET | Freq: Every day | ORAL | Status: DC
  Administered 2016-10-07 – 2016-10-08 (×2): 40 mg via ORAL
  Filled 2016-10-06 (×2): qty 2

## 2016-10-06 MED ORDER — VITAMIN D 1000 UNITS PO TABS
2000.0000 [IU] | ORAL_TABLET | Freq: Every day | ORAL | Status: DC
  Administered 2016-10-07 – 2016-10-08 (×2): 2000 [IU] via ORAL
  Filled 2016-10-06 (×2): qty 2

## 2016-10-06 MED ORDER — MESALAMINE ER 250 MG PO CPCR
500.0000 mg | ORAL_CAPSULE | Freq: Three times a day (TID) | ORAL | Status: DC
  Filled 2016-10-06 (×2): qty 2

## 2016-10-06 MED ORDER — LEVOTHYROXINE SODIUM 50 MCG PO TABS
50.0000 ug | ORAL_TABLET | Freq: Every day | ORAL | Status: DC
  Administered 2016-10-07 – 2016-10-08 (×2): 50 ug via ORAL
  Filled 2016-10-06 (×2): qty 1

## 2016-10-06 MED ORDER — SODIUM CHLORIDE 0.9 % IV SOLN
INTRAVENOUS | Status: DC
  Administered 2016-10-06 – 2016-10-07 (×3): via INTRAVENOUS

## 2016-10-06 MED ORDER — CALCIUM CARBONATE ANTACID 500 MG PO CHEW
1.0000 | CHEWABLE_TABLET | Freq: Once | ORAL | Status: AC
  Administered 2016-10-06: 200 mg via ORAL
  Filled 2016-10-06: qty 1

## 2016-10-06 MED ORDER — ONDANSETRON HCL 4 MG/2ML IJ SOLN: 4.0000 mg | Freq: Four times a day (QID) | INTRAMUSCULAR | Status: DC | PRN

## 2016-10-06 MED ORDER — GLIPIZIDE 10 MG PO TABS
10.0000 mg | ORAL_TABLET | Freq: Two times a day (BID) | ORAL | Status: DC
  Administered 2016-10-06 – 2016-10-07 (×2): 10 mg via ORAL
  Filled 2016-10-06 (×2): qty 1

## 2016-10-06 MED ORDER — PANTOPRAZOLE SODIUM 40 MG PO TBEC
40.0000 mg | DELAYED_RELEASE_TABLET | Freq: Every day | ORAL | Status: DC
  Administered 2016-10-07 – 2016-10-08 (×2): 40 mg via ORAL
  Filled 2016-10-06 (×2): qty 1

## 2016-10-06 MED ORDER — ACETAMINOPHEN 650 MG RE SUPP: 650.0000 mg | Freq: Four times a day (QID) | RECTAL | Status: DC | PRN

## 2016-10-06 MED ORDER — ONDANSETRON HCL 4 MG PO TABS: 4.0000 mg | ORAL_TABLET | Freq: Four times a day (QID) | ORAL | Status: DC | PRN

## 2016-10-06 MED ORDER — CARVEDILOL 6.25 MG PO TABS
3.1250 mg | ORAL_TABLET | Freq: Two times a day (BID) | ORAL | Status: DC
  Administered 2016-10-06 – 2016-10-08 (×4): 3.125 mg via ORAL
  Filled 2016-10-06 (×4): qty 1

## 2016-10-06 MED ORDER — TRAZODONE HCL 50 MG PO TABS
25.0000 mg | ORAL_TABLET | Freq: Every evening | ORAL | Status: DC | PRN
  Administered 2016-10-07 – 2016-10-08 (×2): 25 mg via ORAL
  Filled 2016-10-06 (×2): qty 1

## 2016-10-06 MED ORDER — METHYLPREDNISOLONE SODIUM SUCC 125 MG IJ SOLR
125.0000 mg | Freq: Once | INTRAMUSCULAR | Status: AC
  Administered 2016-10-06: 125 mg via INTRAVENOUS
  Filled 2016-10-06: qty 2

## 2016-10-06 MED ORDER — HYDROCORTISONE NA SUCCINATE PF 100 MG IJ SOLR
100.0000 mg | Freq: Three times a day (TID) | INTRAMUSCULAR | Status: DC
Start: 2016-10-06 — End: 2016-10-08
  Administered 2016-10-06 – 2016-10-08 (×5): 100 mg via INTRAVENOUS
  Filled 2016-10-06 (×5): qty 2

## 2016-10-06 MED ORDER — ACETAMINOPHEN 325 MG PO TABS: 650.0000 mg | ORAL_TABLET | Freq: Four times a day (QID) | ORAL | Status: DC | PRN

## 2016-10-06 NOTE — H&P (Signed)
[facility] [place] NAME: David [name]    MR#:  [id number]  DATE OF BIRTH:  03/29/1946  DATE OF ADMISSION:  10/06/2016  PRIMARY CARE PHYSICIAN: Madelyn Brunner, MD   REQUESTING/REFERRING PHYSICIAN:   CHIEF COMPLAINT:   Chief Complaint  Patient presents with  . Rectal Bleeding    HISTORY OF PRESENT ILLNESS:  David [name]  is a 70 y.o. male with a known history of Ulcerative colitis since 2007. Patient was recently admitted and discharged for similar symptoms. He was treated with oral steroids with resolution of rectal bleeding and discharged on 10/02/16. C diff negative 09/28/16. CT abdomen confirmed on 09/28/16 diffuse colonic wall thickening from proximal transverse colon to rectum.  He was doing well for a few days but then started having rectal bleeding last night.  Reports having a bowel movement every 2 hours and feeling very weak.  He is also very thirsty. PAST MEDICAL HISTORY:   Past Medical History:  Diagnosis Date  . Anemia   . CAD (coronary artery disease) 07/08/2011   Overview:  a. 07/2006: Anterior ST elevation MI. b. 07/2006: PCI with BMS to LAD and RCA. c. 09/2006: Non ST elevation. d. LVEF 30%.  Discussed ICD, not currently interested.   . 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 Dyslipidemia 07/30/2011   Overview:  High triglycerides  . GERD (gastroesophageal reflux disease) 07/08/2011  . History of Clostridium difficile colitis   . History of myocardial infarction   . Hyperlipidemia, unspecified 07/08/2011  . Hypothyroidism   . 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 Type II diabetes mellitus, uncontrolled ([place]) 07/08/2011  . Vitamin D deficiency     PAST SURGICAL HISTORY:   Past Surgical History:  Procedure Laterality Date  . CARDIAC CATHETERIZATION    . CHOLECYSTECTOMY    . COLONOSCOPY  07/20/2006   Crohn's disease  . CORONARY STENT PLACEMENT     . FLEXIBLE SIGMOIDOSCOPY N/A 09/25/2016   Procedure: FLEXIBLE SIGMOIDOSCOPY;  Surgeon: [name], MD;  Location: [facility] ENDOSCOPY;  Service: Endoscopy;  Laterality: N/A;  . MELANOMA REMOVED    . parotid gland removal      SOCIAL HISTORY:   Social History  Substance Use Topics  . Smoking status: Never Smoker  . Smokeless tobacco: Never Used  . Alcohol use No    FAMILY HISTORY:   Family History  Problem Relation Age of Onset  . Heart disease Mother   . Heart attack Father   . Heart disease Sister   . Heart disease Brother     DRUG ALLERGIES:   Allergies  Allergen Reactions  . Codeine Nausea And Vomiting and Other (See Comments)    REVIEW OF SYSTEMS:   Review of Systems  Constitutional: Positive for malaise/fatigue. Negative for chills, fever and weight loss.  HENT: Negative for nosebleeds and sore throat.   Eyes: Negative for blurred vision.  Respiratory: Negative for cough, shortness of breath and wheezing.   Cardiovascular: Negative for chest pain, orthopnea, leg swelling and PND.  Gastrointestinal: Positive for abdominal pain, blood in stool and diarrhea. Negative for constipation, heartburn, nausea and vomiting.  Genitourinary: Negative for dysuria and urgency.  Musculoskeletal: Negative for back pain.  Skin: Negative for rash.  Neurological: Positive for weakness. Negative for dizziness, speech change, focal weakness and headaches.  Endo/Heme/Allergies: Does not bruise/bleed easily.  Psychiatric/Behavioral: Negative for depression.    MEDICATIONS AT HOME:   Prior to  Admission medications   Medication Sig Start Date End Date Taking? Authorizing Provider  aspirin EC 81 MG tablet Take 81 mg by mouth 2 (two) times daily.    Yes Historical Provider, MD  atorvastatin (LIPITOR) 40 MG tablet Take 40 mg by mouth daily.  05/21/16  Yes Historical Provider, MD  carvedilol (COREG) 3.125 MG tablet Take 3.125 mg by mouth 2 (two) times daily with a meal.  05/21/16  Yes  Historical Provider, MD  Cholecalciferol (VITAMIN D) 2000 units CAPS Take 1 capsule by mouth daily.  08/01/09  Yes Historical Provider, MD  glipiZIDE (GLUCOTROL) 10 MG tablet Take 10 mg by mouth 2 (two) times daily.  05/21/16  Yes Historical Provider, MD  levothyroxine (SYNTHROID, LEVOTHROID) 50 MCG tablet Take 50 mcg by mouth daily before breakfast.  05/21/16  Yes Historical Provider, MD  mesalamine (ROWASA) 4 g enema PLACE 60 MLS RECTALLY AT BEDTIME 09/23/16  Yes Historical Provider, MD  Multiple Vitamins-Minerals (MULTIVITAMIN ADULT PO) Take 1 tablet by mouth daily.  06/25/08  Yes Historical Provider, MD  Omega-3 Fatty Acids (FISH OIL PO) Take 1 capsule by mouth 2 (two) times daily.  06/25/08  Yes Historical Provider, MD  omeprazole (PRILOSEC) 20 MG capsule Take 20 mg by mouth daily.  05/21/16  Yes Historical Provider, MD  PENTASA 500 MG CR capsule  09/25/16  Yes Historical Provider, MD  predniSONE (DELTASONE) 10 MG tablet 4 tabs X 7 days, then 3 tabs X 7 days, then 2 tabs X 7 days, then 1 tabs X 7 days and then STOP 10/01/16  Yes Henreitta Leber, MD  ramipril (ALTACE) 5 MG capsule Take 5 mg by mouth daily.  05/21/16  Yes Historical Provider, MD  Corry test strip  09/01/16   Historical Provider, MD      VITAL SIGNS:  Blood pressure 115/65, pulse 73, temperature 98.5 F (36.9 C), temperature source Oral, resp. rate (!) 26, height 5' 11"  (1.803 m), weight 83 kg (183 lb), SpO2 96 %.  PHYSICAL EXAMINATION:  Physical Exam  Constitutional: He is oriented to person, place, and time and well-developed, well-nourished, and in no distress.  HENT:  Head: Normocephalic and atraumatic.  Eyes: Conjunctivae and EOM are normal. Pupils are equal, round, and reactive to light.  Neck: Normal range of motion. Neck supple. No tracheal deviation present. No thyromegaly present.  Cardiovascular: Normal rate, regular rhythm and normal heart sounds.   Pulmonary/Chest: Effort normal and breath sounds  normal. No respiratory distress. He has no wheezes. He exhibits no tenderness.  Abdominal: Soft. Bowel sounds are normal. He exhibits no distension. There is generalized tenderness.  Musculoskeletal: Normal range of motion.  Neurological: He is alert and oriented to person, place, and time. No cranial nerve deficit.  Skin: Skin is warm and dry. No rash noted.  Psychiatric: Mood and affect normal.   LABORATORY PANEL:   CBC  Recent Labs Lab 10/06/16 1436  WBC 5.1  HGB 10.5*  HCT 30.1*  PLT 315   ------------------------------------------------------------------------------------------------------------------  Chemistries   Recent Labs Lab 10/06/16 1436  NA 139  K 2.9*  CL 116*  CO2 20*  GLUCOSE 236*  BUN 10  CREATININE 0.60*  CALCIUM 5.5*  AST 12*  ALT 13*  ALKPHOS 43  BILITOT 0.7   IMPRESSION AND PLAN:  71 year old male with a history of long standing ulcerative colitis- off treatment for many years and recent onset of symptoms. Discharged from hospital 4 days back on oral steroids- was doing well  and got worse last night.   * Severe Ulcerative colitis flare  - His present history of bowel movements every 2 hours, bloody, nocturnal symptoms and cramping suggest severe ulcerative colitis.The fact that he is not responding to steroids is a negative prognostic marker.  - Aggressive IV hydration - Start IV hydrocortisone 100 mg every 8 hours - Stool for C. difficile and other cultures, diarrhea Surgical consult- if he needs surgery may need to be transferred to [place] CBC,CRP,Fecal lactoferrin,BMP,LFT,TB quantiferon,Hepatitis panel, HIV - Serial abdominal exam and if develops marked abdominal distension , tacycradia or tenderness will need imaging to rule out toxic megacolon or perforation  -  GI & surgery consult  * Severe hypokalemia/hypocalcemia - .  This could be due to poor nutrition and poor absorption issue due to severe ulcerative colitis - Aggressive  replete and recheck - Check magnesium  * Hypothyroidism - continue Synthroid  * Diabetes mellitus - Continue glipizide - Sliding scale     All the records are reviewed and case discussed with ED provider. Management plans discussed with the patient, family and they are in agreement.  CODE STATUS: Full code  TOTAL TIME TAKING CARE OF THIS PATIENT:45 minutes.    Max Sane M.D on 10/06/2016 at 5:32 PM  Between 7am to 6pm - Pager - [phone]  After 6pm go to [email or web address] - Proofreader  [facility]  Office  [phone]  CC: Primary care physician; Madelyn Brunner, MD   Note: This dictation was prepared with Dragon dictation along with smaller phrase technology. Any transcriptional errors that result from this process are unintentional.

## 2016-10-06 NOTE — ED Notes (Signed)
Wife in triage, upset because pt isn't going straight back to room. Explained to wife there are no rooms available, they would be placed in subwait.

## 2016-10-06 NOTE — Progress Notes (Signed)
Primary Care Physician: Madelyn Brunner, MD  Primary Gastroenterologist:  Dr. [name]   No chief complaint on file.   HPI: David [name] is a 70 y.o. male here to follow up on his recent discharge. He called in this morning saying he is "doing worse".I saw him initially on 09/22/16 .  Summary of history: He has had ulcerative colitis from 2007 . 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 Remicaid d/c in 2009 due to the malignant skin lesions. Since 2009  Not been on any medications. He says that since then has had a few courses of prednisone. Since 2009  Was being followed by [facility] till 2015 when his doctor retired. Hospitalized in 2011 for flare of the colitis.   His symptoms returned in early December . I performed a sigmoidoscopy on 09/25/16 and I noted moderate to severe left sided colitis. Biopsies  Confirmed marked active colitis negative for HSV and CMVCommenced him on mesalamine which he did not tolerate and was admitted to the hospital with symptoms. He was treated with oral steroids with resolution of rectal bleeding and discharged on 10/02/16. C diff negative 09/28/16  CT abdomen confirmed on 09/28/16 diffuse colonic wall thickening from proximal transverse colon to rectum.  Interval history - since discharge 4 days.   He thought he was getting better, did not have blood in his stools. Overnight his bleeding returned. Presently has had bowel movements every 2 hours, very weak . Thirsty.      Current Outpatient Prescriptions  Medication Sig Dispense Refill  . aspirin EC 81 MG tablet Take 81 mg by mouth 2 (two) times daily.     Marland Kitchen atorvastatin (LIPITOR) 40 MG tablet Take 40 mg by mouth daily.     . carvedilol (COREG) 3.125 MG tablet Take 3.125 mg by mouth 2 (two) times daily with a meal.     .  Cholecalciferol (VITAMIN D) 2000 units CAPS Take 1 capsule by mouth daily.     Marland Kitchen glipiZIDE (GLUCOTROL) 10 MG tablet Take 10 mg by mouth 2 (two) times daily.     Marland Kitchen levothyroxine (SYNTHROID, LEVOTHROID) 50 MCG tablet Take 50 mcg by mouth daily before breakfast.     . Multiple Vitamins-Minerals (MULTIVITAMIN ADULT PO) Take 1 tablet by mouth daily.     . Omega-3 Fatty Acids (FISH OIL PO) Take 1 capsule by mouth 2 (two) times daily.     Marland Kitchen omeprazole (PRILOSEC) 20 MG capsule Take 20 mg by mouth daily.     . predniSONE (DELTASONE) 10 MG tablet 4 tabs X 7 days, then 3 tabs X 7 days, then 2 tabs X 7 days, then 1 tabs X 7 days and then STOP 70 tablet 0  . ramipril (ALTACE) 5 MG capsule Take 5 mg by mouth daily.      No current facility-administered medications for this visit.     Allergies as of 10/06/2016 - Review Complete 09/28/2016  Allergen Reaction Noted  . Codeine Nausea And Vomiting and Other (See Comments)     ROS:  General: Negative for anorexia, weight loss, fever, chills, fatigue, weakness. ENT: Negative for hoarseness, difficulty swallowing , nasal congestion. CV: Negative for chest pain, angina, palpitations, dyspnea on exertion, peripheral edema.  Respiratory: Negative for dyspnea at rest, dyspnea  on exertion, cough, sputum, wheezing.  GI: See history of present illness. GU:  Negative for dysuria, hematuria, urinary incontinence, urinary frequency, nocturnal urination.  Endo: Negative for unusual weight change.    Physical Examination:   There were no vitals taken for this visit.  General: appears lethargic and tired.  Eyes: No icterus. Conjunctivae pink. Mouth: Oropharyngeal mucosa dry and pink , no lesions erythema or exudate. Lungs: Clear to auscultation bilaterally. Non-labored. Heart: Regular rate and rhythm, no murmurs rubs or gallops.  Abdomen: Bowel sounds are normal, nontender, nondistended, no hepatosplenomegaly or masses, no abdominal bruits or hernia , no rebound  or guarding.   Extremities: No lower extremity edema. No clubbing or deformities. Neuro: Alert and oriented x 3.  Grossly intact. Skin: Warm and dry, no jaundice.   Psych: Alert and cooperative, normal mood and affect.   Imaging Studies: Ct Abdomen Pelvis W Contrast  Result Date: 09/28/2016 CLINICAL DATA:  Left lower quadrant pain with recent bloody diarrhea, history of ulcerative colitis EXAM: CT ABDOMEN AND PELVIS WITH CONTRAST TECHNIQUE: Multidetector CT imaging of the abdomen and pelvis was performed using the standard protocol following bolus administration of intravenous contrast. CONTRAST:  163m ISOVUE-300 IOPAMIDOL (ISOVUE-300) INJECTION 61% COMPARISON:  10/07/2011 FINDINGS: Lower chest: Lung bases are well visualized. 7 mm stable nodule is noted in the right lower lobe laterally. Minimal scarring is noted in the right middle lobe stable from the prior exam. These changes are benign given their chronicity. Hepatobiliary: The liver is diffusely decreased in attenuation consistent with fatty infiltration. The gallbladder has been surgically removed. Pancreas: Unremarkable. No pancreatic ductal dilatation or surrounding inflammatory changes. Spleen: Normal in size without focal abnormality. Adrenals/Urinary Tract: The adrenal glands are within normal limits. The kidneys demonstrate a normal enhancement pattern. No abnormal mass is identified. Delayed images demonstrate normal excretion. Bladder is well distended. Stomach/Bowel: Stomach is within normal limits. Appendix appears normal. Small bowel is within normal limits. There is diffuse wall thickening of the colon from approximately the proximal transverse colon extending distally to the rectum. Diverticular change is seen. Only mild very colonic inflammatory change is seen. Changes may be related to the patient's underlying ulcerative colitis. The possibility of an infectious etiology would deserve consideration as well. Scattered associated  mesenteric lymph nodes are noted. Vascular/Lymphatic: Scattered reactive mesenteric lymph nodes. No other significant lymphadenopathy is noted. Aortic atherosclerotic change is seen without aneurysmal dilatation. Reproductive: Prostate is unremarkable. Other: No abdominal wall hernia or abnormality. No abdominopelvic ascites. Musculoskeletal: Degenerative change of the lumbar spine is noted. IMPRESSION: Diffuse colonic wall thickening from the proximal transverse colon to the rectum. Differential includes patient's known ulcerative colitis are possible infectious or ischemic etiologies. Chronic changes as described above. Electronically Signed   By: MInez CatalinaM.D.   On: 09/28/2016 16:01   Dg Abd Acute W/chest  Result Date: 09/30/2016 CLINICAL DATA:  Increasing abdominal pain today. EXAM: DG ABDOMEN ACUTE W/ 1V CHEST COMPARISON:  09/28/2016 FINDINGS: Colonic palm prints Ing is present, typical of mural edema associated with colitis. No evidence of bowel obstruction. No extraluminal air. The upright view of the chest is negative for acute cardiopulmonary abnormality. IMPRESSION: Findings consistent with colitis. No evidence of obstruction or perforation. Electronically Signed   By: DAndreas NewportM.D.   On: 09/30/2016 00:06    Assessment and Plan:   CQUINNTIN MALTERis a 70y.o. y/o male with a history of long standing ulcerative colitis- off treatment for many years and recent onset of  symptoms. Discharged from hospital 4 days back on oral steroids- was doing well but not doing worse. His present history of bowel movements every 2 hours, bloody, nocturnal symptoms and cramping suggest severe ulcerative colitis.The fact that he is not responding to steroids is a negative prognostic marker.  Clinically he appears dehydrated.   Plan:  1. Admit to hospitalist via ER 2. Appears clinically dehydrated will need IV fluids 3. Check stool for c diff and PCR 4. Hydrocortisone IV 100 mg every 8 hours 5.  Surgical consult- if he needs surgery may need to be transferred to [facility] 6. He was on Remicaid many years back which was stopped due to a history of melanoma. Difficulty decision if we need to reattempt rescue with Remicaid vs surgery. If he doesn't respond in 24-48 hours or worsens suggest transfer to [facility].  7. Check CBC,CRP,Fecal lactoferrin,BMP,LFT,TB quantiferon,Hepatitis B surface antigen, core antibody, surface antibody, HIV status, infliximab antibody levels once admitted.,  8. Serial abdominal exam and if develops marked abdominal distension , tacycradia or tenderness will need imaging to rule out toxic megacolon or perforation . 9. GI will follow.  Dr [name]  MD

## 2016-10-06 NOTE — ED Notes (Signed)
Pt reports hx of "ulcerative enterocolitis", states he was admitted recently with "GI bleed." Pt states the bloody stool began Sunday and has progressively gotten worse with increase fatigue and weakness.

## 2016-10-06 NOTE — ED Notes (Signed)
Attempted to call floor nurse for report. RN unable to accept call at this time, was told RN will call back when able.

## 2016-10-06 NOTE — ED Triage Notes (Addendum)
Pt arrives to ER c/o bright red bloody stools. Released from hospital on Friday and has been having these stools since. States he was admitted for bloody stools to begin with and thought he was getting better. Pt c/o weakness and fatigue. Pt alert and oriented X4, active, cooperative, pt in NAD. RR even and unlabored.

## 2016-10-06 NOTE — ED Notes (Signed)
Attempted to draw blood x3. Unable to collect.

## 2016-10-06 NOTE — ED Notes (Signed)
INFORMED RN Minette Brine THAT NEED RECOLLECT LAVENDER AND GREEN TOP TUBE

## 2016-10-06 NOTE — ED Notes (Signed)
Pt needs blood recollect.

## 2016-10-07 DIAGNOSIS — K51911 Ulcerative colitis, unspecified with rectal bleeding: Principal | ICD-10-CM

## 2016-10-07 DIAGNOSIS — K529 Noninfective gastroenteritis and colitis, unspecified: Secondary | ICD-10-CM

## 2016-10-07 LAB — HEPATITIS PANEL, ACUTE
HEP A IGM: NEGATIVE
HEP B C IGM: NEGATIVE
Hepatitis B Surface Ag: NEGATIVE

## 2016-10-07 LAB — BASIC METABOLIC PANEL
Anion gap: 7 (ref 5–15)
BUN: 13 mg/dL (ref 6–20)
CALCIUM: 7.8 mg/dL — AB (ref 8.9–10.3)
CO2: 25 mmol/L (ref 22–32)
CREATININE: 0.98 mg/dL (ref 0.61–1.24)
Chloride: 103 mmol/L (ref 101–111)
GFR calc non Af Amer: >60 mL/min (ref >60)
Glucose, Bld: 271 mg/dL — ABNORMAL HIGH (ref 65–99)
Potassium: 4.2 mmol/L (ref 3.5–5.1)
SODIUM: 135 mmol/L (ref 135–145)

## 2016-10-07 LAB — GLUCOSE, CAPILLARY
GLUCOSE-CAPILLARY: 258 mg/dL — AB (ref 65–99)
Glucose-Capillary: 367 mg/dL — ABNORMAL HIGH (ref 65–99)
Glucose-Capillary: 196 mg/dL — ABNORMAL HIGH (ref 65–99)
Glucose-Capillary: 269 mg/dL — ABNORMAL HIGH (ref 65–99)

## 2016-10-07 LAB — CBC
HCT: 30.8 % — ABNORMAL LOW (ref 40.0–52.0)
Hemoglobin: 10.9 g/dL — ABNORMAL LOW (ref 13.0–18.0)
MCH: 31.3 pg (ref 26.0–34.0)
MCHC: 35.5 g/dL (ref 32.0–36.0)
MCV: 88 fL (ref 80.0–100.0)
PLATELETS: 370 10*3/uL (ref 150–440)
RBC: 3.5 MIL/uL — AB (ref 4.40–5.90)
RDW: 13.6 % (ref 11.5–14.5)
WBC: 6.1 10*3/uL (ref 3.8–10.6)

## 2016-10-07 LAB — PROTIME-INR
INR: 1.11
PROTHROMBIN TIME: 14.3 s (ref 11.4–15.2)

## 2016-10-07 LAB — HIV ANTIBODY (ROUTINE TESTING W REFLEX): HIV SCREEN 4TH GENERATION: NONREACTIVE

## 2016-10-07 MED ORDER — INSULIN ASPART 100 UNIT/ML ~~LOC~~ SOLN
0.0000 [IU] | Freq: Every day | SUBCUTANEOUS | Status: DC
  Administered 2016-10-07: 3 [IU] via SUBCUTANEOUS
  Filled 2016-10-07: qty 3

## 2016-10-07 MED ORDER — GLIPIZIDE 10 MG PO TABS
10.0000 mg | ORAL_TABLET | Freq: Two times a day (BID) | ORAL | Status: DC
  Administered 2016-10-07 – 2016-10-08 (×2): 10 mg via ORAL
  Filled 2016-10-07 (×2): qty 1

## 2016-10-07 MED ORDER — INSULIN ASPART 100 UNIT/ML ~~LOC~~ SOLN
0.0000 [IU] | Freq: Three times a day (TID) | SUBCUTANEOUS | Status: DC
  Administered 2016-10-07: 12:00:00 15 [IU] via SUBCUTANEOUS
  Administered 2016-10-07: 08:00:00 8 [IU] via SUBCUTANEOUS
  Administered 2016-10-07: 17:00:00 3 [IU] via SUBCUTANEOUS
  Administered 2016-10-08: 09:00:00 5 [IU] via SUBCUTANEOUS
  Administered 2016-10-08: 12:00:00 15 [IU] via SUBCUTANEOUS
  Filled 2016-10-07: qty 15
  Filled 2016-10-07: qty 3
  Filled 2016-10-07: qty 8
  Filled 2016-10-07: qty 5
  Filled 2016-10-07: qty 15

## 2016-10-07 NOTE — Care Management (Signed)
Admitted to this facility with the diagnosis of ulcerative colitis. Discharged 10/02/16. Lives with wife, Jana Half [phone]). No home health, No skilled facility. No home oxygen. Rolling walker and bedside commode in the home, if needed. No falls. Weight loss of 10 pounds in 3 weeks. Prescriptions are filled at [facility] in [place] care of all basic activities of daily living himself, drives. Retired from [employer]. Wife will transport. Shelbie Ammons RN MSN CCM Care Management

## 2016-10-07 NOTE — Care Management Important Message (Signed)
Important Message  Patient Details  Name: David [name] MRN: [id number] Date of Birth: 1946-08-05   Medicare Important Message Given:  Yes    Shelbie Ammons, RN 10/07/2016, 10:51 AM

## 2016-10-07 NOTE — Progress Notes (Signed)
[place] [facility] [place] NAME: David [name]    MR#:  [id number]  DATE OF BIRTH:  [birth year]  SUBJECTIVE:  Patient presented with bright red blood per rectum yesterday. He was recently admitted with ulcerative colitis flare treated with oral prednisone. Denies any more bloody stool. He had a bowel movement on 6 AM which was nonbloody. Some cramping and abdominal pain with bowel movement. 5 in the room.  REVIEW OF SYSTEMS:   Review of Systems  Constitutional: Negative for chills, fever and weight loss.  HENT: Negative for ear discharge, ear pain and nosebleeds.   Eyes: Negative for blurred vision, pain and discharge.  Respiratory: Negative for sputum production, shortness of breath, wheezing and stridor.   Cardiovascular: Negative for chest pain, palpitations, orthopnea and PND.  Gastrointestinal: Positive for abdominal pain and blood in stool. Negative for diarrhea, nausea and vomiting.  Genitourinary: Negative for frequency and urgency.  Musculoskeletal: Negative for back pain and joint pain.  Neurological: Negative for sensory change, speech change, focal weakness and weakness.  Psychiatric/Behavioral: Negative for depression and hallucinations. The patient is not nervous/anxious.    Tolerating Diet:yes Tolerating PT: ambulatory  DRUG ALLERGIES:   Allergies  Allergen Reactions  . Codeine Nausea And Vomiting and Other (See Comments)    VITALS:  Blood pressure 123/64, pulse 71, temperature 97.9 F (36.6 C), temperature source Oral, resp. rate 20, height 5' 11"  (1.803 m), weight 82 kg (180 lb 12.8 oz), SpO2 98 %.  PHYSICAL EXAMINATION:   Physical Exam  GENERAL:  70 y.o.-year-old patient lying in the bed with no acute distress.  EYES: Pupils equal, round, reactive to light and accommodation. No scleral icterus. Extraocular muscles intact.  HEENT: Head atraumatic, normocephalic. Oropharynx and nasopharynx clear.  NECK:  Supple,  no jugular venous distention. No thyroid enlargement, no tenderness.  LUNGS: Normal breath sounds bilaterally, no wheezing, rales, rhonchi. No use of accessory muscles of respiration.  CARDIOVASCULAR: S1, S2 normal. No murmurs, rubs, or gallops.  ABDOMEN: Soft, nontender, nondistended. Bowel sounds present. No organomegaly or mass.  EXTREMITIES: No cyanosis, clubbing or edema b/l.    NEUROLOGIC: Cranial nerves II through XII are intact. No focal Motor or sensory deficits b/l.   PSYCHIATRIC:  patient is alert and oriented x 3.  SKIN: No obvious rash, lesion, or ulcer.   LABORATORY PANEL:  CBC  Recent Labs Lab 10/07/16 0540  WBC 6.1  HGB 10.9*  HCT 30.8*  PLT 370    Chemistries   Recent Labs Lab 10/06/16 1436 10/06/16 1759 10/07/16 0540  NA 139  --  135  K 2.9* 4.7 4.2  CL 116*  --  103  CO2 20*  --  25  GLUCOSE 236*  --  271*  BUN 10  --  13  CREATININE 0.60*  --  0.98  CALCIUM 5.5*  --  7.8*  MG  --  2.2  --   AST 12*  --   --   ALT 13*  --   --   ALKPHOS 43  --   --   BILITOT 0.7  --   --    Cardiac Enzymes No results for input(s): TROPONINI in the last 168 hours. RADIOLOGY:  No results found. ASSESSMENT AND PLAN:  Skyler Carel  is a 70 y.o. male with a known history of Ulcerative colitis since 2007. Patient was recently admitted and discharged for similar symptoms. He was treated with oral steroids with resolution of rectal  bleeding and discharged on 10/02/16. C diff negative 09/28/16. CT abdomen confirmed on 09/28/16 diffuse colonic wall thickening from proximal transverse colon to rectum.  He was doing well for a few days but then started having rectal bleeding last night  * Severe Ulcerative colitis flare  - His present history of bowel movements every 2 hours, bloody, nocturnal symptoms and cramping suggest severe ulcerative colitis. -cont IV hydration - Start IV hydrocortisone 100 mg every 8 hours per Gi rec - Stool for C. Difficile neg -GI panel  neg -Surgical consult- if he needs surgery may need to be transferred to [place] - TB quantiferon sent out -Hepatitis panel, HIV negative  * Severe hypokalemia/hypocalcemia -this could be due to poor nutrition and poor absorption issue due to severe ulcerative colitis - replete and recheck -magnesium ok  * Hypothyroidism - continue Synthroid  * Diabetes mellitus - Continue glipizide - Sliding scale  Case discussed with Care Management/Social Worker. Management plans discussed with the patient, family and they are in agreement.  CODE STATUS: full  DVT Prophylaxis:SCD/TEDS TOTAL TIME TAKING CARE OF THIS PATIENT: 30 minutes.  >50% time spent on counselling and coordination of care  POSSIBLE D/C IN 1-2DAYS, DEPENDING ON CLINICAL CONDITION.  Note: This dictation was prepared with Dragon dictation along with smaller phrase technology. Any transcriptional errors that result from this process are unintentional.  Dreonna Hussein M.D on 10/07/2016 at 12:35 PM  Between 7am to 6pm - Pager - [phone]  After 6pm go to [email or web address] - password [email or web address] [place] [facility]  Office  [phone]  CC: Primary care physician; Madelyn Brunner, MD

## 2016-10-07 NOTE — Consult Note (Signed)
Patient ID: David [name], male   DOB: 08-27-1946, 70 y.o.   MRN: [id number]  HPI GEORGIO [name] is a 70 y.o. male asked to see in consultation by Dr. 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 No history of ulcerative colitis since 2007 and has been followed by Dr. Vicente Males. He was also recently discharged from the hospital for a flareup and was given by mouth steroids and hydration. His recent sigmoidoscopy showed left-sided colitis consistent with UC. Also his CT scan performed on December 18 show diffuse wall thickening of the colon from proximal colon To the rectum. I have personally reviewed his films. He was admitted for a few days and was sent home on the 22nd. Apparently his Christmas time he overdid and was more labile with his eating habits. Yesterday he came back to emergency room complaining of abdominal pain, bloody diarrhea and dehydration. He was placed on IV fluids and IV steroids. This morning he has no complaints he had a normal bowel movement this morning, he experiences no abdominal pain, no nausea or vomiting. He is tolerating some PO.  HPI  Past Medical History:  Diagnosis Date  . Anemia   . CAD (coronary artery disease) 07/08/2011   Overview:  a. 07/2006: Anterior ST elevation MI. b. 07/2006: PCI with BMS to LAD and RCA. c. 09/2006: Non ST elevation. d. LVEF 30%.  Discussed ICD, not currently interested.   . 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 ([place]) 9/32/6712  . Dyslipidemia 07/30/2011   Overview:  High triglycerides  . GERD (gastroesophageal reflux disease) 07/08/2011  . History of Clostridium difficile colitis   . History of myocardial infarction   . Hyperlipidemia, unspecified 07/08/2011  . Hypothyroidism   . 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 Type II diabetes mellitus, uncontrolled ([place]) 07/08/2011  . Vitamin D deficiency     Past Surgical History:  Procedure Laterality Date  . CARDIAC CATHETERIZATION    .  CHOLECYSTECTOMY    . COLONOSCOPY  07/20/2006   Crohn's disease  . CORONARY STENT PLACEMENT    . FLEXIBLE SIGMOIDOSCOPY N/A 09/25/2016   Procedure: FLEXIBLE SIGMOIDOSCOPY;  Surgeon: [name], MD;  Location: [facility] ENDOSCOPY;  Service: Endoscopy;  Laterality: N/A;  . MELANOMA REMOVED    . parotid gland removal      Family History  Problem Relation Age of Onset  . Heart disease Mother   . Heart attack Father   . Heart disease Sister   . Heart disease Brother     Social History Social History  Substance Use Topics  . Smoking status: Never Smoker  . Smokeless tobacco: Never Used  . Alcohol use No    Allergies  Allergen Reactions  . Codeine Nausea And Vomiting and Other (See Comments)    Current Facility-Administered Medications  Medication Dose Route Frequency Provider Last Rate Last Dose  . 0.9 %  sodium chloride infusion   Intravenous Continuous Fritzi Mandes, MD 75 mL/hr at 10/07/16 4580    . acetaminophen (TYLENOL) tablet 650 mg  650 mg Oral Q6H PRN Max Sane, MD       Or  . acetaminophen (TYLENOL) suppository 650 mg  650 mg Rectal Q6H PRN Max Sane, MD      . atorvastatin (LIPITOR) tablet 40 mg  40 mg Oral Daily Max Sane, MD   40 mg at 10/07/16 0756  . carvedilol (COREG) tablet 3.125 mg  3.125 mg Oral BID WC Vipul Manuella Ghazi, MD   3.125 mg at  10/07/16 0755  . cholecalciferol (VITAMIN D) tablet 2,000 Units  2,000 Units Oral Daily Max Sane, MD   2,000 Units at 10/07/16 0756  . glipiZIDE (GLUCOTROL) tablet 10 mg  10 mg Oral BID AC Scott D Christy, RPH      . hydrocortisone sodium succinate (SOLU-CORTEF) 100 MG injection 100 mg  100 mg Intravenous Q8H Max Sane, MD   100 mg at 10/07/16 0754  . insulin aspart (novoLOG) injection 0-15 Units  0-15 Units Subcutaneous TID WC Fritzi Mandes, MD   8 Units at 10/07/16 0815  . insulin aspart (novoLOG) injection 0-5 Units  0-5 Units Subcutaneous QHS Fritzi Mandes, MD      . levothyroxine (SYNTHROID, LEVOTHROID) tablet 50 mcg  50 mcg Oral Q0600  Max Sane, MD   50 mcg at 10/07/16 0640  . ondansetron (ZOFRAN) tablet 4 mg  4 mg Oral Q6H PRN Max Sane, MD       Or  . ondansetron (ZOFRAN) injection 4 mg  4 mg Intravenous Q6H PRN Vipul Shah, MD      . pantoprazole (PROTONIX) EC tablet 40 mg  40 mg Oral Daily Max Sane, MD   40 mg at 10/07/16 0756  . ramipril (ALTACE) capsule 5 mg  5 mg Oral Daily Max Sane, MD   5 mg at 10/07/16 0756  . traZODone (DESYREL) tablet 25 mg  25 mg Oral QHS PRN Max Sane, MD   25 mg at 10/07/16 1062     Review of Systems A 10 point review of systems was asked and was negative except for the information on the HPI  Physical Exam Blood pressure 123/64, pulse 71, temperature 97.9 F (36.6 C), temperature source Oral, resp. rate 20, height 5' 11"  (1.803 m), weight 82 kg (180 lb 12.8 oz), SpO2 98 %. CONSTITUTIONAL: NAD EYES: Pupils are equal, round, and reactive to light, Sclera are non-icteric. EARS, NOSE, MOUTH AND THROAT: The oropharynx is clear. The oral mucosa is pink and moist. Hearing is intact to voice. LYMPH NODES:  Lymph nodes in the neck are normal. RESPIRATORY:  Lungs are clear. There is normal respiratory effort, with equal breath sounds bilaterally, and without pathologic use of accessory muscles. CARDIOVASCULAR: Heart is regular without murmurs, gallops, or rubs. GI: The abdomen is  soft, nontender, and nondistended. There are no palpable masses. There is no hepatosplenomegaly. There are normal bowel sounds in all quadrants. GU: Rectal deferred.   MUSCULOSKELETAL: Normal muscle strength and tone. No cyanosis or edema.   SKIN: Turgor is good and there are no pathologic skin lesions or ulcers. NEUROLOGIC: Motor and sensation is grossly normal. Cranial nerves are grossly intact. PSYCH:  Oriented to person, place and time. Affect is normal.  Data Reviewed I have personally reviewed the patient's imaging, laboratory findings and medical records.    Assessment/Plan Ulcerative colitis flareup  responding to medical treatment. No need for surgical intervention. He was very frustrated because I ordered an abdominal film since I was given the consultation for toxic megacolon. I discussed with him that my reasoning behind it was that I could not attend to him immediately therefore an x-ray will provide me with a more immediate assessment. He understood my thought process. He states that about having any more tests done and I have canceled his abdominal x-ray since his clinical findings does not support any toxic megacolon or evidence of perforation. I will defer to 40 treatment to Dr. Vicente Males and the medical team. He also regenerate to me that in  case he were to have any surgical intervention he will rather go to [facility] and not having any surgeries performed here [facility]. We will follow him on a when necessary basis. No need for surgical intervention.    Caroleen Hamman, MD FACS General Surgeon 10/07/2016, 11:16 AM

## 2016-10-07 NOTE — Progress Notes (Signed)
Inpatient Diabetes Program Recommendations  AACE/ADA: New Consensus Statement on Inpatient Glycemic Control (2015)  Target Ranges:  Prepandial:   less than 140 mg/dL      Peak postprandial:   less than 180 mg/dL (1-2 hours)      Critically ill patients:  140 - 180 mg/dL   Results for David [name], David [name] (MRN [id number]) as of 10/07/2016 12:42  Ref. Range 10/02/2016 07:21 10/02/2016 11:55 10/07/2016 07:28 10/07/2016 11:23  Glucose-Capillary Latest Ref Range: 65 - 99 mg/dL 168 (H) 339 (H) 258 (H) 367 (H)   Review of Glycemic Control  Diabetes history: DM 2 Outpatient Diabetes medications: Glipizide 10 mg BID Current orders for Inpatient glycemic control: Glipizide 1- mg BID, Novolog Moderate (0-15 units) + HS (0-5 units) scale  A1c 7.6% on 10/01/16 Good home control  Inpatient Diabetes Program Recommendations:   Hyperglycemia 200-300 range. Patient received IV solumedrol 125 x1 dose on admission and is now on Solucortef 100 mg Q8 hours.   If patient remains on current steroid dose, Please consider Lantus 10 units and adding meal coverage Novolog 3-5 units TID, in addition to correction scale, if patient consumes at least 50% of meals.  Thanks,  Tama Headings RN, MSN, [facility] Inpatient Diabetes Coordinator Team Pager [phone] (8a-5p)

## 2016-10-07 NOTE — Progress Notes (Signed)
While rounding, [place] made initial visit to room 108. Pt was awake and alert. Wife was bedside. Pt was concerned about a procedure he will have soon. Wife requested prayer for the MD and RN who would be providing his care, which was provided. CH is available for follow up as needed.    10/07/16 1400  Clinical Encounter Type  Visited With Patient;Patient and family together  Visit Type Initial;Spiritual support  Referral From Nurse  Spiritual Encounters  Spiritual Needs Prayer

## 2016-10-07 NOTE — Progress Notes (Signed)
David Bellows MD [address], [place], [place] [address] Phone: [phone] Fax : [phone]  David [name] is being followed for severe ulcerative colitis  Day 2 of follow up   Subjective: Feels much better, no rectal bleeding , only 2 bowel movements, no abdominal pain .   Objective: Vital signs in last 24 hours: Vitals:   10/06/16 1630 10/06/16 2026 10/07/16 0500 10/07/16 0504  BP: 115/65 109/63  123/64  Pulse: 73 84  71  Resp: (!) 26 19  20   Temp:  98 F (36.7 C)  97.9 F (36.6 C)  TempSrc:  Oral  Oral  SpO2: 96% 97%  98%  Weight: 181 lb 11.2 oz (82.4 kg)  180 lb 12.8 oz (82 kg)   Height:       Weight change:   Intake/Output Summary (Last 24 hours) at 10/07/16 2841 Last data filed at 10/07/16 3244  Gross per 24 hour  Intake          2263.33 ml  Output              350 ml  Net          1913.33 ml     Exam: Heart:: Regular rate and rhythm Lungs: normal Abdomen: soft, nontender, normal bowel sounds   Lab Results: @LABTEST2 @ Micro Results: Recent Results (from the past 240 hour(s))  C difficile quick scan w PCR reflex     Status: None   Collection Time: 09/28/16  1:27 PM  Result Value Ref Range Status   C Diff antigen NEGATIVE NEGATIVE Final   C Diff toxin NEGATIVE NEGATIVE Final   C Diff interpretation No C. difficile detected.  Final  Gastrointestinal Panel by PCR , Stool     Status: None   Collection Time: 10/06/16  7:30 PM  Result Value Ref Range Status   Campylobacter species NOT DETECTED NOT DETECTED Final   Plesimonas shigelloides NOT DETECTED NOT DETECTED Final   Salmonella species NOT DETECTED NOT DETECTED Final   Yersinia enterocolitica NOT DETECTED NOT DETECTED Final   Vibrio species NOT DETECTED NOT DETECTED Final   Vibrio cholerae NOT DETECTED NOT DETECTED Final   Enteroaggregative E coli (EAEC) NOT DETECTED NOT DETECTED Final   Enteropathogenic E coli (EPEC) NOT DETECTED NOT DETECTED Final   Enterotoxigenic E coli (ETEC)  NOT DETECTED NOT DETECTED Final   Shiga like toxin producing E coli (STEC) NOT DETECTED NOT DETECTED Final   Shigella/Enteroinvasive E coli (EIEC) NOT DETECTED NOT DETECTED Final   Cryptosporidium NOT DETECTED NOT DETECTED Final   Cyclospora cayetanensis NOT DETECTED NOT DETECTED Final   Entamoeba histolytica NOT DETECTED NOT DETECTED Final   Giardia lamblia NOT DETECTED NOT DETECTED Final   Adenovirus F40/41 NOT DETECTED NOT DETECTED Final   Astrovirus NOT DETECTED NOT DETECTED Final   Norovirus GI/GII NOT DETECTED NOT DETECTED Final   Rotavirus A NOT DETECTED NOT DETECTED Final   Sapovirus (I, II, IV, and V) NOT DETECTED NOT DETECTED Final  C difficile quick scan w PCR reflex     Status: None   Collection Time: 10/06/16  7:30 PM  Result Value Ref Range Status   C Diff antigen NEGATIVE NEGATIVE Final   C Diff toxin NEGATIVE NEGATIVE Final   C Diff interpretation No C. difficile detected.  Final   CBC Latest Ref Rng & Units 10/07/2016 10/06/2016 09/29/2016  WBC 3.8 - 10.6 K/uL 6.1 5.1 7.8  Hemoglobin 13.0 - 18.0 g/dL 10.9(L) 10.5(L) 12.1(L)  Hematocrit  40.0 - 52.0 % 30.8(L) 30.1(L) 33.8(L)  Platelets 150 - 440 K/uL 370 315 227   BMP Latest Ref Rng & Units 10/07/2016 10/06/2016 10/06/2016  Glucose 65 - 99 mg/dL 271(H) - 236(H)  BUN 6 - 20 mg/dL 13 - 10  Creatinine 0.61 - 1.24 mg/dL 0.98 - 0.60(L)  Sodium 135 - 145 mmol/L 135 - 139  Potassium 3.5 - 5.1 mmol/L 4.2 4.7 2.9(L)  Chloride 101 - 111 mmol/L 103 - 116(H)  CO2 22 - 32 mmol/L 25 - 20(L)  Calcium 8.9 - 10.3 mg/dL 7.8(L) - 5.5(LL)    Studies/Results: No results found. Medications: I have reviewed the patient's current medications. Scheduled Meds: . atorvastatin  40 mg Oral Daily  . carvedilol  3.125 mg Oral BID WC  . cholecalciferol  2,000 Units Oral Daily  . glipiZIDE  10 mg Oral BID  . hydrocortisone sod succinate (SOLU-CORTEF) inj  100 mg Intravenous Q8H  . insulin aspart  0-15 Units Subcutaneous TID WC  . insulin  aspart  0-5 Units Subcutaneous QHS  . levothyroxine  50 mcg Oral Q0600  . pantoprazole  40 mg Oral Daily  . ramipril  5 mg Oral Daily   Continuous Infusions: . sodium chloride 75 mL/hr at 10/07/16 0712   PRN Meds:.acetaminophen **OR** acetaminophen, ondansetron **OR** ondansetron (ZOFRAN) IV, traZODone   Assessment: Active Problems:   Ulcerative colitis (HCC)  David [name] is a 70 y.o. y/o male with a history of long standing ulcerative colitis- off treatment for many years and recent onset of symptoms that returned in 09/2016 . Discharged from hospital 5 days back on oral steroids- was doing well but got worse after going home with  bowel movements every 2 hours, bloody, nocturnal symptoms and cramping suggest severe ulcerative colitis.Clinically he appeared dehydrated and I sent him to the ER after his office visit on 10/06/2016   Plan: 1. Ulcerative pan colitis- severe- improving clinically on IV steroids, if he continues to show improvement , will continue IV steroids for a total of 48-72 hours then transition to oral steroids and watch for atleast 24 hours before discharge 2. Continue regular diet  3. I have ordered hepatitis B serology , TB quantiferon and infliximab antibody levels in case we need to escalate care.  4. Check CRP tomorrow   LOS: 1 day   David [name] 10/07/2016, 8:32 AM

## 2016-10-08 LAB — HEPATITIS B CORE ANTIBODY, TOTAL: Hep B Core Total Ab: NEGATIVE

## 2016-10-08 LAB — GLUCOSE, CAPILLARY
GLUCOSE-CAPILLARY: 219 mg/dL — AB (ref 65–99)
Glucose-Capillary: 365 mg/dL — ABNORMAL HIGH (ref 65–99)

## 2016-10-08 LAB — HEPATITIS B SURFACE ANTIGEN: Hepatitis B Surface Ag: NEGATIVE

## 2016-10-08 MED ORDER — PREDNISONE 10 MG PO TABS: ORAL_TABLET | ORAL | 0 refills | Status: DC

## 2016-10-08 MED ORDER — PREDNISONE 20 MG PO TABS
40.0000 mg | ORAL_TABLET | Freq: Every day | ORAL | Status: DC
  Administered 2016-10-08: 40 mg via ORAL
  Filled 2016-10-08: qty 2

## 2016-10-08 NOTE — Discharge Instructions (Signed)
Easy on diet

## 2016-10-08 NOTE — Discharge Summary (Signed)
[name] NAME: David [name]    MR#:  [id number]  DATE OF BIRTH:  05-May-1946  DATE OF ADMISSION:  10/06/2016 ADMITTING PHYSICIAN: Max Sane, MD  DATE OF DISCHARGE: 10/08/16  PRIMARY CARE PHYSICIAN: Madelyn Brunner, MD    ADMISSION DIAGNOSIS:  Ulcerative colitis with rectal bleeding, unspecified location ([place]) [K51.911]  DISCHARGE DIAGNOSIS:  Recurrent Ulcerative Colitis-imprvoing  SECONDARY DIAGNOSIS:   Past Medical History:  Diagnosis Date  . Anemia   . CAD (coronary artery disease) 07/08/2011   Overview:  a. 07/2006: Anterior ST elevation MI. b. 07/2006: PCI with BMS to LAD and RCA. c. 09/2006: Non ST elevation. d. LVEF 30%.  Discussed ICD, not currently interested.   . 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 ([place]) 7/82/9562  . Dyslipidemia 07/30/2011   Overview:  High triglycerides  . GERD (gastroesophageal reflux disease) 07/08/2011  . History of Clostridium difficile colitis   . History of myocardial infarction   . Hyperlipidemia, unspecified 07/08/2011  . Hypothyroidism   . 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 Type II diabetes mellitus, uncontrolled ([place]) 07/08/2011  . Vitamin D deficiency     HOSPITAL COURSE:  [name] a 70 y.o.malewith a known history of Ulcerative colitis since2007. Patient was recently admitted and discharged for similar symptoms. He was treated with oral steroids with resolution of rectal bleeding and discharged on 10/02/16. C diff negative 09/28/16. CT abdomen confirmed on 09/28/16 diffuse colonic wall thickening from proximal transverse colon to rectum.He was doing well for a few days but then started having rectal bleeding last night  * Severe Ulcerative colitis flare  - His present history of bowel movements every 2 hours, bloody, nocturnal symptoms and cramping suggest severe ulcerative colitis. -crecieved IV  hydration -  IV hydrocortisone 100 mg every 8 hours per Gi rec---change to po prednisone 40 mg daily - Stool for C. Difficile neg -GI panel neg -Surgical consult- if he needs surgery may need to be transferred to [facility] - TB quantiferon sent out -Hepatitis panel, HIV negative -no more bleeding since admission  * Severe hypokalemia/hypocalcemia -this could be due to poor nutrition and poor absorption issue due to severe ulcerative colitis - replete and recheck -magnesium ok  * Hypothyroidism - continueSynthroid  * Diabetes mellitus-elevated sugars due to steroids - Continue glipizide - Sliding scale  Overall better. Spoke with GI. Pt will see GI next week  CONSULTS OBTAINED:  Treatment Team:  [name], MD  DRUG ALLERGIES:   Allergies  Allergen Reactions  . Codeine Nausea And Vomiting and Other (See Comments)    DISCHARGE MEDICATIONS:   Current Discharge Medication List    CONTINUE these medications which have CHANGED   Details  predniSONE (DELTASONE) 10 MG tablet 40 mg daily for now till you see the GI doctor Qty: 70 tablet, Refills: 0      CONTINUE these medications which have NOT CHANGED   Details  aspirin EC 81 MG tablet Take 81 mg by mouth 2 (two) times daily.     atorvastatin (LIPITOR) 40 MG tablet Take 40 mg by mouth daily.     carvedilol (COREG) 3.125 MG tablet Take 3.125 mg by mouth 2 (two) times daily with a meal.     Cholecalciferol (VITAMIN D) 2000 units CAPS Take 1 capsule by mouth daily.     glipiZIDE (GLUCOTROL) 10 MG tablet Take 10 mg by mouth 2 (two) times daily.     levothyroxine (  SYNTHROID, LEVOTHROID) 50 MCG tablet Take 50 mcg by mouth daily before breakfast.     Multiple Vitamins-Minerals (MULTIVITAMIN ADULT PO) Take 1 tablet by mouth daily.     Omega-3 Fatty Acids (FISH OIL PO) Take 1 capsule by mouth 2 (two) times daily.     omeprazole (PRILOSEC) 20 MG capsule Take 20 mg by mouth daily.     ramipril (ALTACE) 5 MG capsule Take 5  mg by mouth daily.     ACCU-CHEK AVIVA PLUS test strip       STOP taking these medications     mesalamine (ROWASA) 4 g enema      PENTASA 500 MG CR capsule         If you experience worsening of your admission symptoms, develop shortness of breath, life threatening emergency, suicidal or homicidal thoughts you must seek medical attention immediately by calling 911 or calling your MD immediately  if symptoms less severe.  You Must read complete instructions/literature along with all the possible adverse reactions/side effects for all the Medicines you take and that have been prescribed to you. Take any new Medicines after you have completely understood and accept all the possible adverse reactions/side effects.   Please note  You were cared for by a hospitalist during your hospital stay. If you have any questions about your discharge medications or the care you received while you were in the hospital after you are discharged, you can call the unit and asked to speak with the hospitalist on call if the hospitalist that took care of you is not available. Once you are discharged, your primary care physician will handle any further medical issues. Please note that NO REFILLS for any discharge medications will be authorized once you are discharged, as it is imperative that you return to your primary care physician (or establish a relationship with a primary care physician if you do not have one) for your aftercare needs so that they can reassess your need for medications and monitor your lab values. Today   SUBJECTIVE   Doing ok  VITAL SIGNS:  Blood pressure (!) 141/66, pulse 77, temperature 97.7 F (36.5 C), temperature source Oral, resp. rate 18, height 5' 11"  (1.803 m), weight 83.7 kg (184 lb 8 oz), SpO2 96 %.  I/O:   Intake/Output Summary (Last 24 hours) at 10/08/16 1044 Last data filed at 10/08/16 1012  Gross per 24 hour  Intake          2163.33 ml  Output              525 ml   Net          1638.33 ml    PHYSICAL EXAMINATION:  GENERAL:  70 y.o.-year-old patient lying in the bed with no acute distress.  EYES: Pupils equal, round, reactive to light and accommodation. No scleral icterus. Extraocular muscles intact.  HEENT: Head atraumatic, normocephalic. Oropharynx and nasopharynx clear.  NECK:  Supple, no jugular venous distention. No thyroid enlargement, no tenderness.  LUNGS: Normal breath sounds bilaterally, no wheezing, rales,rhonchi or crepitation. No use of accessory muscles of respiration.  CARDIOVASCULAR: S1, S2 normal. No murmurs, rubs, or gallops.  ABDOMEN: Soft, non-tender, non-distended. Bowel sounds present. No organomegaly or mass.  EXTREMITIES: No pedal edema, cyanosis, or clubbing.  NEUROLOGIC: Cranial nerves II through XII are intact. Muscle strength 5/5 in all extremities. Sensation intact. Gait not checked.  PSYCHIATRIC: The patient is alert and oriented x 3.  SKIN: No obvious rash, lesion, or ulcer.  DATA REVIEW:   CBC   Recent Labs Lab 10/07/16 0540  WBC 6.1  HGB 10.9*  HCT 30.8*  PLT 370    Chemistries   Recent Labs Lab 10/06/16 1436 10/06/16 1759 10/07/16 0540  NA 139  --  135  K 2.9* 4.7 4.2  CL 116*  --  103  CO2 20*  --  25  GLUCOSE 236*  --  271*  BUN 10  --  13  CREATININE 0.60*  --  0.98  CALCIUM 5.5*  --  7.8*  MG  --  2.2  --   AST 12*  --   --   ALT 13*  --   --   ALKPHOS 43  --   --   BILITOT 0.7  --   --     Microbiology Results   Recent Results (from the past 240 hour(s))  C difficile quick scan w PCR reflex     Status: None   Collection Time: 09/28/16  1:27 PM  Result Value Ref Range Status   C Diff antigen NEGATIVE NEGATIVE Final   C Diff toxin NEGATIVE NEGATIVE Final   C Diff interpretation No C. difficile detected.  Final  Gastrointestinal Panel by PCR , Stool     Status: None   Collection Time: 10/06/16  7:30 PM  Result Value Ref Range Status   Campylobacter species NOT DETECTED NOT  DETECTED Final   Plesimonas shigelloides NOT DETECTED NOT DETECTED Final   Salmonella species NOT DETECTED NOT DETECTED Final   Yersinia enterocolitica NOT DETECTED NOT DETECTED Final   Vibrio species NOT DETECTED NOT DETECTED Final   Vibrio cholerae NOT DETECTED NOT DETECTED Final   Enteroaggregative E coli (EAEC) NOT DETECTED NOT DETECTED Final   Enteropathogenic E coli (EPEC) NOT DETECTED NOT DETECTED Final   Enterotoxigenic E coli (ETEC) NOT DETECTED NOT DETECTED Final   Shiga like toxin producing E coli (STEC) NOT DETECTED NOT DETECTED Final   Shigella/Enteroinvasive E coli (EIEC) NOT DETECTED NOT DETECTED Final   Cryptosporidium NOT DETECTED NOT DETECTED Final   Cyclospora cayetanensis NOT DETECTED NOT DETECTED Final   Entamoeba histolytica NOT DETECTED NOT DETECTED Final   Giardia lamblia NOT DETECTED NOT DETECTED Final   Adenovirus F40/41 NOT DETECTED NOT DETECTED Final   Astrovirus NOT DETECTED NOT DETECTED Final   Norovirus GI/GII NOT DETECTED NOT DETECTED Final   Rotavirus A NOT DETECTED NOT DETECTED Final   Sapovirus (I, II, IV, and V) NOT DETECTED NOT DETECTED Final  C difficile quick scan w PCR reflex     Status: None   Collection Time: 10/06/16  7:30 PM  Result Value Ref Range Status   C Diff antigen NEGATIVE NEGATIVE Final   C Diff toxin NEGATIVE NEGATIVE Final   C Diff interpretation No C. difficile detected.  Final    RADIOLOGY:  No results found.   Management plans discussed with the patient, family and they are in agreement.  CODE STATUS:     Code Status Orders        Start     Ordered   10/06/16 1549  Full code  Continuous     10/06/16 1549    Code Status History    Date Active Date Inactive Code Status Order ID Comments User Context   09/28/2016  6:44 PM 10/02/2016  5:29 PM Full Code [id number]  Gladstone Lighter, MD Inpatient    Advance Directive Documentation   Flowsheet Row Most Recent Value  Type of Advance  Directive  Healthcare Power of  Attorney, Living will  Pre-existing out of facility DNR order (yellow form or pink MOST form)  No data  "MOST" Form in Place?  No data      TOTAL TIME TAKING CARE OF THIS PATIENT: 40 minutes.    Sahith Nurse M.D on 10/08/2016 at 10:44 AM  Between 7am to 6pm - Pager - [phone] After 6pm go to [email or web address] - password [email or web address] [facility]  Office  [phone]  CC: Primary care physician; Madelyn Brunner, MD

## 2016-10-08 NOTE — Progress Notes (Signed)
Pt complained of edema to face and ankles.  Reports voiding every 1-2 hours and has only had one small bm since 7 PM.  Requested IV fluids be stopped.  Spoke with Dr Marcille Blanco and received order to stop fluids.  Dorna Bloom RN

## 2016-10-08 NOTE — Progress Notes (Signed)
Ch mad a follow up visit. Pt was in good spirits. MD decided to treat his condition medically as opposed to surgery at this time. (Pr. Pt) The medicine appears to be working. He is hoping for a late discharge today.

## 2016-10-08 NOTE — Progress Notes (Signed)
Patient being d/ced home.  Has had 2 loose brown stools since admission with no blood.  Amount was approx 1/2 cup per patient.  He's transitioned from IV steroids to PO.  Patient has very good appetite.  Blood sugar elevated because of steroids.   Have suggested he is more diligent with his diet while on the steroids.  IV removed by Jackelyn Poling, CNA.  Reviewed d/c instructions and f/u appts.  Patient will go home with his wife this afternoon.

## 2016-10-08 NOTE — Progress Notes (Signed)
[name] MD [address], [place], [place] [address] Phone: [phone] Fax : [phone]  David [name] is being followed for severe ulcerative colitis  Day 2 of follow up   Subjective: Feels well, no blood in stool , solid formed stools x 2  He really wants to go home. I suggested he stay a day or two with oral steroids tosee how he does but he wants to go home.    Objective: Vital signs in last 24 hours: Vitals:   10/08/16 0500 10/08/16 0532 10/08/16 0803 10/08/16 1213  BP:  134/60 (!) 141/66 126/60  Pulse:  71 77 76  Resp:  18  18  Temp:  97.7 F (36.5 C)  98.1 F (36.7 C)  TempSrc:  Oral  Oral  SpO2:  96%  97%  Weight: 184 lb 8 oz (83.7 kg)     Height:       Weight change: 1 lb 8 oz (0.68 kg)  Intake/Output Summary (Last 24 hours) at 10/08/16 1502 Last data filed at 10/08/16 1359  Gross per 24 hour  Intake          2163.33 ml  Output              525 ml  Net          1638.33 ml     Exam: Heart:: S1S2 present Lungs: normal Abdomen: soft, nontender, normal bowel sounds   Lab Results: @LABTEST2 @ Micro Results: Recent Results (from the past 240 hour(s))  Gastrointestinal Panel by PCR , Stool     Status: None   Collection Time: 10/06/16  7:30 PM  Result Value Ref Range Status   Campylobacter species NOT DETECTED NOT DETECTED Final   Plesimonas shigelloides NOT DETECTED NOT DETECTED Final   Salmonella species NOT DETECTED NOT DETECTED Final   Yersinia enterocolitica NOT DETECTED NOT DETECTED Final   Vibrio species NOT DETECTED NOT DETECTED Final   Vibrio cholerae NOT DETECTED NOT DETECTED Final   Enteroaggregative E coli (EAEC) NOT DETECTED NOT DETECTED Final   Enteropathogenic E coli (EPEC) NOT DETECTED NOT DETECTED Final   Enterotoxigenic E coli (ETEC) NOT DETECTED NOT DETECTED Final   Shiga like toxin producing E coli (STEC) NOT DETECTED NOT DETECTED Final   Shigella/Enteroinvasive E coli (EIEC) NOT DETECTED NOT DETECTED Final   Cryptosporidium NOT DETECTED NOT DETECTED Final   Cyclospora cayetanensis NOT DETECTED NOT DETECTED Final   Entamoeba histolytica NOT DETECTED NOT DETECTED Final   Giardia lamblia NOT DETECTED NOT DETECTED Final   Adenovirus F40/41 NOT DETECTED NOT DETECTED Final   Astrovirus NOT DETECTED NOT DETECTED Final   Norovirus GI/GII NOT DETECTED NOT DETECTED Final   Rotavirus A NOT DETECTED NOT DETECTED Final   Sapovirus (I, II, IV, and V) NOT DETECTED NOT DETECTED Final  C difficile quick scan w PCR reflex     Status: None   Collection Time: 10/06/16  7:30 PM  Result Value Ref Range Status   C Diff antigen NEGATIVE NEGATIVE Final   C Diff toxin NEGATIVE NEGATIVE Final   C Diff interpretation No C. difficile detected.  Final   Studies/Results: No results found. Medications: I have reviewed the patient's current medications. Scheduled Meds: . atorvastatin  40 mg Oral Daily  . carvedilol  3.125 mg Oral BID WC  . cholecalciferol  2,000 Units Oral Daily  . glipiZIDE  10 mg Oral BID AC  . insulin aspart  0-15 Units Subcutaneous TID WC  . insulin aspart  0-5 Units Subcutaneous QHS  . levothyroxine  50 mcg Oral Q0600  . pantoprazole  40 mg Oral Daily  . predniSONE  40 mg Oral Q breakfast  . ramipril  5 mg Oral Daily   Continuous Infusions: PRN Meds:.acetaminophen **OR** acetaminophen, ondansetron **OR** ondansetron (ZOFRAN) IV, traZODone   Assessment: Active Problems:   Ulcerative colitis ([place])    Plan: 1. Home with prednisone 40 mg Q daily. I will see him in outpatient on 10/13/16 as scheduled. He is advised to call ASAP if symptoms worse.   Dr [name]  Gastroenterology/Hepatology Pager: [phone]    LOS: 2 days   [name] 10/08/2016, 3:02 PM

## 2016-10-09 ENCOUNTER — Telehealth: Payer: Self-pay

## 2016-10-09 LAB — QUANTIFERON IN TUBE
QFT TB AG MINUS NIL VALUE: 0 [IU]/mL
QUANTIFERON MITOGEN VALUE: 0.1 [IU]/mL
QUANTIFERON TB AG VALUE: 0.03 IU/mL
QUANTIFERON TB GOLD: UNDETERMINED
Quantiferon Nil Value: 0.03 IU/mL

## 2016-10-09 LAB — QUANTIFERON TB GOLD ASSAY (BLOOD)

## 2016-10-09 NOTE — Telephone Encounter (Signed)
Inform that to continue steroids, avoid NSAID's, if gets worse with diarrhea  he would need to come back to hospital . Lets check on him on Tuesday

## 2016-10-09 NOTE — Telephone Encounter (Signed)
Please advise 

## 2016-10-09 NOTE — Telephone Encounter (Signed)
Patient left the hospital yesterday feeling fine. Dr. Vicente Males told the patient to call if there is any changes. This morning around 2:00 am he was passing soft stool and there was blood in there. Please call patient and advice.

## 2016-10-09 NOTE — Telephone Encounter (Signed)
Spoke with pt and advised him of Dr Georgeann Oppenheim recommendations. Pt will continue steroids and avoid NSAIDS. Will follow up with pt on Tuesday as he as an office appt. Pt notified if diarrhea and bleeding worsens to go straight to [facility]. Pt verbalized understanding.

## 2016-10-10 NOTE — ED Provider Notes (Signed)
Time Seen: Approximately 1323  I have reviewed the triage notes  Chief Complaint: Rectal Bleeding   History of Present Illness: David [name] is a 70 y.o. male who presents with acute gastrointestinal bleeding. Patient's had previous admission here for ulcerative colitis. He states that his bleeding 6 elevated once again he was seen and evaluated by his gastroenterologist and was referred here to emergency department for further evaluation. Patient describes bright red blood parotid and without any rectal pain. He denies any significant abdominal pain but states diffuse occasionally left-sided periumbilical abdominal discomfort. He has some generalized fatigue but hasn't had any syncopal episodes. No focal weakness Past Medical History:  Diagnosis Date  . Anemia   . CAD (coronary artery disease) 07/08/2011   Overview:  a. 07/2006: Anterior ST elevation MI. b. 07/2006: PCI with BMS to LAD and RCA. c. 09/2006: Non ST elevation. d. LVEF 30%.  Discussed ICD, not currently interested.   . 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 Dyslipidemia 07/30/2011   Overview:  High triglycerides  . GERD (gastroesophageal reflux disease) 07/08/2011  . History of Clostridium difficile colitis   . History of myocardial infarction   . Hyperlipidemia, unspecified 07/08/2011  . Hypothyroidism   . 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 Type II diabetes mellitus, uncontrolled ([place]) 07/08/2011  . Vitamin D deficiency     Patient Active Problem List   Diagnosis Date Noted  . Ulcerative colitis ([place]) 10/06/2016  . Protein-calorie malnutrition, severe 09/29/2016  . Ulcerative colitis with complication ([place])   . Lower GI bleed   . Colitis 09/28/2016  . Anal or rectal pain   . Idiopathic chronic inflammatory bowel disease   . Ulceration of intestine   . Moderate chronic ulcerative colitis with rectal bleeding ([place])   . Chronic ulcerative enterocolitis  without complication ([place]) 82/95/6213  . 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 Dyslipidemia 07/30/2011  . CAD (coronary artery disease) 07/08/2011  . GERD (gastroesophageal reflux disease) 07/08/2011  . Hyperlipidemia, unspecified 07/08/2011  . 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 Type II diabetes mellitus, uncontrolled ([place]) 07/08/2011    Past Surgical History:  Procedure Laterality Date  . CARDIAC CATHETERIZATION    . CHOLECYSTECTOMY    . COLONOSCOPY  07/20/2006   Crohn's disease  . CORONARY STENT PLACEMENT    . FLEXIBLE SIGMOIDOSCOPY N/A 09/25/2016   Procedure: FLEXIBLE SIGMOIDOSCOPY;  Surgeon: [name], MD;  Location: [facility] ENDOSCOPY;  Service: Endoscopy;  Laterality: N/A;  . MELANOMA REMOVED    . parotid gland removal      Past Surgical History:  Procedure Laterality Date  . CARDIAC CATHETERIZATION    . CHOLECYSTECTOMY    . COLONOSCOPY  07/20/2006   Crohn's disease  . CORONARY STENT PLACEMENT    . FLEXIBLE SIGMOIDOSCOPY N/A 09/25/2016   Procedure: FLEXIBLE SIGMOIDOSCOPY;  Surgeon: [name], MD;  Location: [facility] ENDOSCOPY;  Service: Endoscopy;  Laterality: N/A;  . MELANOMA REMOVED    . parotid gland removal      Current Outpatient Rx  . Order #: [id number] Class: Historical Med  . Order #: [id number] Class: Historical Med  . Order #: [id number] Class: Historical Med  . Order #: [id number] Class: Historical Med  . Order #: [id number] Class: Historical Med  . Order #: [id number] Class: Historical Med  . Order #: [id number] Class: Historical Med  . Order #: [id number] Class: Historical Med  . Order #: [id number] Class: Historical Med  . Order #: [id number] Class: Historical Med  .  Order #: [id number] Class: Historical Med  . Order #: [id number] Class: Print    Allergies:  Codeine  Family History: Family History  Problem Relation Age of Onset  . Heart disease Mother   . Heart attack Father   . Heart disease Sister   . Heart disease Brother      Social History: Social History  Substance Use Topics  . Smoking status: Never Smoker  . Smokeless tobacco: Never Used  . Alcohol use No     Review of Systems:   10 point review of systems was performed and was otherwise negative:  Constitutional: No fever Eyes: No visual disturbances ENT: No sore throat, ear pain Cardiac: No chest pain Respiratory: No shortness of breath, wheezing, or stridor Abdomen: No Current abdominal pain, no vomiting, No diarrhea. Gastrointestinal bleeding described above Endocrine: No weight loss, No night sweats Extremities: No peripheral edema, cyanosis Skin: No rashes, easy bruising Neurologic: No focal weakness, trouble with speech or swollowing Urologic: No dysuria, Hematuria, or urinary frequency   Physical Exam:  ED Triage Vitals  Enc Vitals Group     BP 10/06/16 1204 104/65     Pulse Rate 10/06/16 1204 88     Resp 10/06/16 1204 16     Temp 10/06/16 1204 98.5 F (36.9 C)     Temp Source 10/06/16 1204 Oral     SpO2 10/06/16 1204 100 %     Weight 10/06/16 1204 183 lb (83 kg)     Height 10/06/16 1204 5' 11"  (1.803 m)     Head Circumference --      Peak Flow --      Pain Score 10/06/16 1346 4     Pain Loc --      Pain Edu? --      Excl. in [place]? --     General: Awake , Alert , and Oriented times 3; GCS 15 Head: Normal cephalic , atraumatic Eyes: Pupils equal , round, reactive to light Nose/Throat: No nasal drainage, patent upper airway without erythema or exudate.  Neck: Supple, Full range of motion, No anterior adenopathy or palpable thyroid masses Lungs: Clear to ascultation without wheezes , rhonchi, or rales Heart: Regular rate, regular rhythm without murmurs , gallops , or rubs Abdomen: Mild tenderness left lower quadrant without rebound, guarding , or rigidity; bowel sounds positive and symmetric in all 4 quadrants. No organomegaly .        Extremities: 2 plus symmetric pulses. No edema, clubbing or cyanosis Neurologic:  normal ambulation, Motor symmetric without deficits, sensory intact Skin: warm, dry, no rashes   Labs:   All laboratory work was reviewed including any pertinent negatives or positives listed below:  Labs Reviewed  URINALYSIS, COMPLETE (UACMP) WITH MICROSCOPIC - Abnormal; Notable for the following:       Result Value   Color, Urine YELLOW (*)    APPearance CLEAR (*)    Glucose, UA >=500 (*)    Ketones, ur 20 (*)    All other components within normal limits  CBC - Abnormal; Notable for the following:    RBC 3.38 (*)    Hemoglobin 10.5 (*)    HCT 30.1 (*)    All other components within normal limits  COMPREHENSIVE METABOLIC PANEL - Abnormal; Notable for the following:    Potassium 2.9 (*)    Chloride 116 (*)    CO2 20 (*)    Glucose, Bld 236 (*)    Creatinine, Ser 0.60 (*)    Calcium 5.5 (*)  Total Protein 4.2 (*)    Albumin 1.8 (*)    AST 12 (*)    ALT 13 (*)    Anion gap 3 (*)    All other components within normal limits  PROTIME-INR - Abnormal; Notable for the following:    Prothrombin Time 15.3 (*)    All other components within normal limits  C-REACTIVE PROTEIN - Abnormal; Notable for the following:    CRP 2.6 (*)    All other components within normal limits  BASIC METABOLIC PANEL - Abnormal; Notable for the following:    Glucose, Bld 271 (*)    Calcium 7.8 (*)    All other components within normal limits  CBC - Abnormal; Notable for the following:    RBC 3.50 (*)    Hemoglobin 10.9 (*)    HCT 30.8 (*)    All other components within normal limits  GLUCOSE, CAPILLARY - Abnormal; Notable for the following:    Glucose-Capillary 258 (*)    All other components within normal limits  GLUCOSE, CAPILLARY - Abnormal; Notable for the following:    Glucose-Capillary 367 (*)    All other components within normal limits  GLUCOSE, CAPILLARY - Abnormal; Notable for the following:    Glucose-Capillary 196 (*)    All other components within normal limits  GLUCOSE,  CAPILLARY - Abnormal; Notable for the following:    Glucose-Capillary 269 (*)    All other components within normal limits  GLUCOSE, CAPILLARY - Abnormal; Notable for the following:    Glucose-Capillary 219 (*)    All other components within normal limits  GLUCOSE, CAPILLARY - Abnormal; Notable for the following:    Glucose-Capillary 365 (*)    All other components within normal limits  GASTROINTESTINAL PANEL BY PCR, STOOL (REPLACES STOOL CULTURE)  C DIFFICILE QUICK SCREEN W PCR REFLEX  APTT  HEPATITIS PANEL, ACUTE  PROTIME-INR  HIV ANTIBODY (ROUTINE TESTING)  POTASSIUM  MAGNESIUM  QUANTIFERON TB GOLD ASSAY (BLOOD)  HEPATITIS B CORE ANTIBODY, TOTAL  HEPATITIS B SURFACE ANTIGEN  QUANTIFERON IN TUBE  HEPATITIS B E ANTIBODY  HEPATITIS B E ANTIGEN  HEPATITIS B SURFACE ANTIBODY, QUANTITATIVE  MISC [facility] TEST (SEND OUT)  TYPE AND SCREEN    ED Course:  Patient's currently hemodynamically stable and I felt did not require immediate blood transfusion. I spoke to the hospitalist team, the patient be admitted for further observation and testing GI consultation. Patient may require surgery due to the persistent extensive nature of his ulcerative colitis. Clinical Course      Final Clinical Impression:   Final diagnoses:  Ulcerative colitis with rectal bleeding, unspecified location [facility])     Plan:  Inpatient            Daymon Larsen, MD 10/10/16 1925

## 2016-10-13 ENCOUNTER — Encounter: Payer: Self-pay | Admitting: Gastroenterology

## 2016-10-13 ENCOUNTER — Ambulatory Visit (INDEPENDENT_AMBULATORY_CARE_PROVIDER_SITE_OTHER): Payer: Medicare Other | Admitting: Gastroenterology

## 2016-10-13 VITALS — BP 100/66 | HR 82 | Temp 97.7°F | Resp 18 | Ht 72.0 in | Wt 180.4 lb

## 2016-10-13 DIAGNOSIS — K51011 Ulcerative (chronic) pancolitis with rectal bleeding: Secondary | ICD-10-CM | POA: Diagnosis not present

## 2016-10-13 NOTE — Progress Notes (Signed)
Primary Care Physician: Madelyn Brunner, MD  Primary Gastroenterologist:  Dr. [name]   Chief Complaint  Patient presents with  . Follow-up    Ulcerative Colitis    HPI: David [name] is a 71 y.o. male here to follow up on his recent discharge 10/08/16.   Summary of history: He has had ulcerative colitis from 2007 . 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 Remicaid d/c in 2009 due to the malignant skin lesions. Since 2009 Not been on any medications. He says that since then has had a few courses of prednisone. Since 2009 being followed by [facility] till 2015 when his doctor retired. Hospitalized in 2011 for flare of the colitis.   His symptoms returned in early December . I performed a sigmoidoscopy on 09/25/16 and I noted moderate to severe left sided colitis. Biopsies  Confirmed marked active colitis negative for HSV and CMV.Commenced him on mesalamine which he did not tolerate and was admitted to the hospital with symptoms. He was treated with oral steroids with resolution of rectal bleeding and discharged on 10/02/16. C diff negative 09/28/16  abdomen confirmed on 09/28/16 diffuse colonic wall thickening from proximal transverse colon to rectum.He was seen at my office on 10/06/16 and he appeared dehydrated, having severe symptoms from his colitis and was then admitted .   Interval history 10/06/16-10/13/16  During admission - stools tests negative for infection, commenced on IV hydrocortisone with rapid improvement . Discharged on oral prednisone. Slowly improving - passing soft pieces of stool . Some blood. Having 5-6 bowel movements a day . Some occasional abdominal pain. Does have some nocturnal cramping.   Labs 09/2016 - TB quantiferon indeterminant. HbsAg,Hep B c Ab,HIV,HCV ab  -negative. CRP 2.6  10/06/16 .    Current Outpatient Prescriptions  Medication Sig Dispense Refill  . ACCU-CHEK AVIVA PLUS test strip     . atorvastatin (LIPITOR) 40 MG tablet Take 40 mg by mouth daily.     . carvedilol (COREG) 3.125 MG tablet Take 3.125 mg by mouth 2 (two) times daily with a meal.     . Cholecalciferol (VITAMIN D) 2000 units CAPS Take 1 capsule by mouth daily.     Marland Kitchen glipiZIDE (GLUCOTROL) 10 MG tablet Take 10 mg by mouth 2 (two) times daily.     Marland Kitchen levothyroxine (SYNTHROID, LEVOTHROID) 50 MCG tablet Take 50 mcg by mouth daily before breakfast.     . Multiple Vitamins-Minerals (MULTIVITAMIN ADULT PO) Take 1 tablet by mouth daily.     . Omega-3 Fatty Acids (FISH OIL PO) Take 1 capsule by mouth 2 (two) times daily.     Marland Kitchen omeprazole (PRILOSEC) 20 MG capsule Take 20 mg by mouth daily.     . predniSONE (DELTASONE) 10 MG tablet 40 mg daily for now till you see the GI doctor 70 tablet 0  . ramipril (ALTACE) 5 MG capsule Take 5 mg by mouth daily.     Marland Kitchen aspirin EC 81 MG tablet Take 81 mg by mouth 2 (two) times daily.      No current facility-administered medications for this visit.     Allergies as of 10/13/2016 - Review Complete 10/13/2016  Allergen Reaction Noted  . Codeine Nausea And Vomiting and Other (See Comments)     ROS:  General: Negative for anorexia, weight loss, fever, chills, fatigue, weakness. ENT: Negative for hoarseness, difficulty swallowing , nasal congestion. CV: Negative for chest pain, angina, palpitations, dyspnea on exertion, peripheral edema.  Respiratory: Negative for dyspnea at rest, dyspnea on exertion, cough, sputum, wheezing.  GI: See history of present illness. GU:  Negative for dysuria, hematuria, urinary incontinence, urinary frequency, nocturnal urination.  Endo: Negative for unusual weight change.    Physical Examination:   BP 100/66   Pulse 82   Temp 97.7 F (36.5 C)   Resp 18   Ht 6' (1.829 m)   Wt 180 lb 6.4 oz (81.8 kg)   SpO2 96%   BMI 24.47  kg/m   General: Well-nourished, well-developed in no acute distress.  Eyes: No icterus. Conjunctivae pink. Mouth: Oropharyngeal mucosa moist and pink , no lesions erythema or exudate. Lungs: Clear to auscultation bilaterally. Non-labored. Heart: Regular rate and rhythm, no murmurs rubs or gallops.  Abdomen: Bowel sounds are normal, nontender, nondistended, no hepatosplenomegaly or masses, no abdominal bruits or hernia , no rebound or guarding.   Extremities: No lower extremity edema. No clubbing or deformities. Neuro: Alert and oriented x 3.  Grossly intact. Skin: Warm and dry, no jaundice.   Psych: Alert and cooperative, normal mood and affect.    Imaging Studies: Ct Abdomen Pelvis W Contrast  Result Date: 09/28/2016 CLINICAL DATA:  Left lower quadrant pain with recent bloody diarrhea, history of ulcerative colitis EXAM: CT ABDOMEN AND PELVIS WITH CONTRAST TECHNIQUE: Multidetector CT imaging of the abdomen and pelvis was performed using the standard protocol following bolus administration of intravenous contrast. CONTRAST:  154m ISOVUE-300 IOPAMIDOL (ISOVUE-300) INJECTION 61% COMPARISON:  10/07/2011 FINDINGS: Lower chest: Lung bases are well visualized. 7 mm stable nodule is noted in the right lower lobe laterally. Minimal scarring is noted in the right middle lobe stable from the prior exam. These changes are benign given their chronicity. Hepatobiliary: The liver is diffusely decreased in attenuation consistent with fatty infiltration. The gallbladder has been surgically removed. Pancreas: Unremarkable. No pancreatic ductal dilatation or surrounding inflammatory changes. Spleen: Normal in size without focal abnormality. Adrenals/Urinary Tract: The adrenal glands are within normal limits. The kidneys demonstrate a normal enhancement pattern. No abnormal mass is identified. Delayed images demonstrate normal excretion. Bladder is well distended. Stomach/Bowel: Stomach is within normal limits.  Appendix appears normal. Small bowel is within normal limits. There is diffuse wall thickening of the colon from approximately the proximal transverse colon extending distally to the rectum. Diverticular change is seen. Only mild very colonic inflammatory change is seen. Changes may be related to the patient's underlying ulcerative colitis. The possibility of an infectious etiology would deserve consideration as well. Scattered associated mesenteric lymph nodes are noted. Vascular/Lymphatic: Scattered reactive mesenteric lymph nodes. No other significant lymphadenopathy is noted. Aortic atherosclerotic change is seen without aneurysmal dilatation. Reproductive: Prostate is unremarkable. Other: No abdominal wall hernia or abnormality. No abdominopelvic ascites. Musculoskeletal: Degenerative change of the lumbar spine is noted. IMPRESSION: Diffuse colonic wall thickening from the proximal transverse colon to the rectum. Differential includes patient's known ulcerative colitis are possible infectious or ischemic etiologies. Chronic changes as described above. Electronically Signed   By: MInez CatalinaM.D.   On: 09/28/2016 16:01   Dg Abd Acute W/chest  Result Date: 09/30/2016 CLINICAL DATA:  Increasing abdominal pain today. EXAM: DG ABDOMEN ACUTE W/ 1V CHEST COMPARISON:  09/28/2016 FINDINGS: Colonic palm prints Ing is present, typical of mural edema associated with colitis. No evidence of  bowel obstruction. No extraluminal air. The upright view of the chest is negative for acute cardiopulmonary abnormality. IMPRESSION: Findings consistent with colitis. No evidence of obstruction or perforation. Electronically Signed   By: Andreas Newport M.D.   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 Discharged from hospital 4 days back on oral steroids.   I explained  he definetly needs stepping up of treatment . Due to prior history of skin cancer options of treatment are slightly more harder due to increased risk of skin cancers associated with Anti TNF and relatively less data available on the newer agents, options such as Imuran too increase risk of non melanomatous skin cancer.  Tb quantiferon is indeterminant.   I explained that at this time due to the complex nature of his situation , I would like to refer him to [facility] to a specialized IBD center as they would have more experience treating individuals with severe colitis and prior history of skin cancer. I also explained that there is a possibility of requiring surgery in terms of a colectomy if he fails treatment and would be best served at [facility] . I have suggested to continue prednisone at 40 mg a day for another week then decrease by 5 mg. I will refer him to the IBD center at [place]. I will see him back in 2 weeks to see how he is doing. He complains of b/l swelling of feet, likely from fluid retention from steroids, I would avoid lasix as he has 5-6 bowel movements a day and is at risk for dehydration. He has been advised to drink a lot of fluids . No NSAID's/.   Dr [name]  MD

## 2016-10-13 NOTE — Patient Instructions (Signed)
We will have you follow-up in 2 weeks as scheduled below.  We will place your referral to [facility] as an urgent referral. Their office will get in touch with you to make this appointment. If you do not hear from them by next Friday, call our office so that we may check on the status of this.

## 2016-10-14 ENCOUNTER — Telehealth: Payer: Self-pay | Admitting: Gastroenterology

## 2016-10-14 NOTE — Telephone Encounter (Signed)
Great. Thank you setting up referral.

## 2016-10-14 NOTE — Telephone Encounter (Signed)
Referral has been sent to [facility]. All clinic notes, labs and procedures have been faxed to [phone]. Patient will be contacted by [facility] with an appointment date and time 3-5 business days after chart has been reviewed. I will contact patient within these days to confirm that he has been given an appointment in a timely manner.

## 2016-10-15 LAB — HEPATITIS B E ANTIBODY

## 2016-10-15 LAB — HEPATITIS B E ANTIGEN

## 2016-10-15 LAB — HEPATITIS B SURFACE ANTIBODY, QUANTITATIVE

## 2016-10-26 ENCOUNTER — Inpatient Hospital Stay
Admission: EM | Admit: 2016-10-26 | Discharge: 2016-11-04 | DRG: 871 | Disposition: A | Discharge status: Home / Self Care | Payer: Medicare Other | Attending: Internal Medicine | Admitting: Internal Medicine

## 2016-10-26 ENCOUNTER — Emergency Department: Payer: Medicare Other

## 2016-10-26 ENCOUNTER — Encounter: Payer: Self-pay | Admitting: Emergency Medicine

## 2016-10-26 DIAGNOSIS — Z515 Encounter for palliative care: Secondary | ICD-10-CM

## 2016-10-26 DIAGNOSIS — Z7982 Long term (current) use of aspirin: Secondary | ICD-10-CM

## 2016-10-26 DIAGNOSIS — K55059 Acute (reversible) ischemia of intestine, part and extent unspecified: Secondary | ICD-10-CM | POA: Diagnosis present

## 2016-10-26 DIAGNOSIS — I252 Old myocardial infarction: Secondary | ICD-10-CM

## 2016-10-26 DIAGNOSIS — A4151 Sepsis due to Escherichia coli [E. coli]: Principal | ICD-10-CM | POA: Diagnosis present

## 2016-10-26 DIAGNOSIS — R040 Epistaxis: Secondary | ICD-10-CM | POA: Diagnosis not present

## 2016-10-26 DIAGNOSIS — K219 Gastro-esophageal reflux disease without esophagitis: Secondary | ICD-10-CM | POA: Diagnosis present

## 2016-10-26 DIAGNOSIS — E785 Hyperlipidemia, unspecified: Secondary | ICD-10-CM | POA: Diagnosis present

## 2016-10-26 DIAGNOSIS — K51511 Left sided colitis with rectal bleeding: Secondary | ICD-10-CM | POA: Diagnosis present

## 2016-10-26 DIAGNOSIS — Z955 Presence of coronary angioplasty implant and graft: Secondary | ICD-10-CM

## 2016-10-26 DIAGNOSIS — Z7952 Long term (current) use of systemic steroids: Secondary | ICD-10-CM

## 2016-10-26 DIAGNOSIS — A419 Sepsis, unspecified organism: Secondary | ICD-10-CM | POA: Diagnosis present

## 2016-10-26 DIAGNOSIS — I429 Cardiomyopathy, unspecified: Secondary | ICD-10-CM | POA: Diagnosis present

## 2016-10-26 DIAGNOSIS — Z9049 Acquired absence of other specified parts of digestive tract: Secondary | ICD-10-CM

## 2016-10-26 DIAGNOSIS — I81 Portal vein thrombosis: Secondary | ICD-10-CM | POA: Diagnosis present

## 2016-10-26 DIAGNOSIS — I472 Ventricular tachycardia: Secondary | ICD-10-CM | POA: Diagnosis present

## 2016-10-26 DIAGNOSIS — R7989 Other specified abnormal findings of blood chemistry: Secondary | ICD-10-CM

## 2016-10-26 DIAGNOSIS — K5100 Ulcerative (chronic) pancolitis without complications: Secondary | ICD-10-CM | POA: Diagnosis present

## 2016-10-26 DIAGNOSIS — E034 Atrophy of thyroid (acquired): Secondary | ICD-10-CM | POA: Diagnosis present

## 2016-10-26 DIAGNOSIS — I2510 Atherosclerotic heart disease of native coronary artery without angina pectoris: Secondary | ICD-10-CM | POA: Diagnosis present

## 2016-10-26 DIAGNOSIS — E876 Hypokalemia: Secondary | ICD-10-CM | POA: Diagnosis not present

## 2016-10-26 DIAGNOSIS — K55069 Acute infarction of intestine, part and extent unspecified: Secondary | ICD-10-CM | POA: Diagnosis present

## 2016-10-26 DIAGNOSIS — Z8249 Family history of ischemic heart disease and other diseases of the circulatory system: Secondary | ICD-10-CM

## 2016-10-26 DIAGNOSIS — R509 Fever, unspecified: Secondary | ICD-10-CM | POA: Diagnosis present

## 2016-10-26 DIAGNOSIS — Z7189 Other specified counseling: Secondary | ICD-10-CM

## 2016-10-26 DIAGNOSIS — Z7984 Long term (current) use of oral hypoglycemic drugs: Secondary | ICD-10-CM

## 2016-10-26 DIAGNOSIS — Z79899 Other long term (current) drug therapy: Secondary | ICD-10-CM

## 2016-10-26 DIAGNOSIS — E119 Type 2 diabetes mellitus without complications: Secondary | ICD-10-CM | POA: Diagnosis present

## 2016-10-26 DIAGNOSIS — Z885 Allergy status to narcotic agent status: Secondary | ICD-10-CM

## 2016-10-26 DIAGNOSIS — I10 Essential (primary) hypertension: Secondary | ICD-10-CM | POA: Diagnosis present

## 2016-10-26 LAB — CBC WITH DIFFERENTIAL/PLATELET
BASOS ABS: 0 10*3/uL (ref 0–0.1)
Basophils Relative: 0 %
EOS ABS: 0 10*3/uL (ref 0–0.7)
Eosinophils Relative: 0 %
HCT: 32 % — ABNORMAL LOW (ref 40.0–52.0)
HEMOGLOBIN: 10.9 g/dL — AB (ref 13.0–18.0)
LYMPHS ABS: 0.1 10*3/uL — AB (ref 1.0–3.6)
LYMPHS PCT: 1 %
MCH: 30.3 pg (ref 26.0–34.0)
MCHC: 34 g/dL (ref 32.0–36.0)
MCV: 89.2 fL (ref 80.0–100.0)
Monocytes Absolute: 0.2 10*3/uL (ref 0.2–1.0)
Monocytes Relative: 1 %
NEUTROS PCT: 98 %
Neutro Abs: 11.6 10*3/uL — ABNORMAL HIGH (ref 1.4–6.5)
Platelets: 266 10*3/uL (ref 150–440)
RBC: 3.58 MIL/uL — AB (ref 4.40–5.90)
RDW: 14.6 % — ABNORMAL HIGH (ref 11.5–14.5)
WBC: 11.9 10*3/uL — AB (ref 3.8–10.6)

## 2016-10-26 LAB — COMPREHENSIVE METABOLIC PANEL
ALBUMIN: 2.3 g/dL — AB (ref 3.5–5.0)
ALT: 15 U/L — ABNORMAL LOW (ref 17–63)
AST: 25 U/L (ref 15–41)
Alkaline Phosphatase: 121 U/L (ref 38–126)
Anion gap: 10 (ref 5–15)
BUN: 16 mg/dL (ref 6–20)
CO2: 24 mmol/L (ref 22–32)
Calcium: 8 mg/dL — ABNORMAL LOW (ref 8.9–10.3)
Chloride: 98 mmol/L — ABNORMAL LOW (ref 101–111)
Creatinine, Ser: 1.12 mg/dL (ref 0.61–1.24)
GFR calc Af Amer: >60 mL/min (ref >60)
GFR calc non Af Amer: >60 mL/min (ref >60)
GLUCOSE: 429 mg/dL — AB (ref 65–99)
POTASSIUM: 3.8 mmol/L (ref 3.5–5.1)
SODIUM: 132 mmol/L — AB (ref 135–145)
Total Bilirubin: 0.9 mg/dL (ref 0.3–1.2)
Total Protein: 5.9 g/dL — ABNORMAL LOW (ref 6.5–8.1)

## 2016-10-26 LAB — URINALYSIS, COMPLETE (UACMP) WITH MICROSCOPIC
BILIRUBIN URINE: NEGATIVE
Bacteria, UA: NONE SEEN
Glucose, UA: >=500 mg/dL — AB
HGB URINE DIPSTICK: NEGATIVE
KETONES UR: 20 mg/dL — AB
LEUKOCYTES UA: NEGATIVE
NITRITE: NEGATIVE
PH: 5 (ref 5.0–8.0)
Protein, ur: NEGATIVE mg/dL
SPECIFIC GRAVITY, URINE: 1.039 — AB (ref 1.005–1.030)

## 2016-10-26 LAB — LACTIC ACID, PLASMA
LACTIC ACID, VENOUS: 5 mmol/L — AB (ref 0.5–1.9)
Lactic Acid, Venous: 2.9 mmol/L (ref 0.5–1.9)

## 2016-10-26 LAB — PROTIME-INR
INR: 1.11
Prothrombin Time: 14.4 seconds (ref 11.4–15.2)

## 2016-10-26 LAB — INFLUENZA PANEL BY PCR (TYPE A & B)
Influenza A By PCR: NEGATIVE
Influenza B By PCR: NEGATIVE

## 2016-10-26 MED ORDER — ACETAMINOPHEN 500 MG PO TABS
ORAL_TABLET | ORAL | Status: AC
  Administered 2016-10-26: 1000 mg via ORAL
  Filled 2016-10-26: qty 2

## 2016-10-26 MED ORDER — IOPAMIDOL (ISOVUE-300) INJECTION 61%
100.0000 mL | Freq: Once | INTRAVENOUS | Status: AC | PRN
  Administered 2016-10-26: 100 mL via INTRAVENOUS

## 2016-10-26 MED ORDER — IOPAMIDOL (ISOVUE-300) INJECTION 61%
15.0000 mL | INTRAVENOUS | Status: AC
  Administered 2016-10-26 (×2): 15 mL via ORAL

## 2016-10-26 MED ORDER — ACETAMINOPHEN 500 MG PO TABS
1000.0000 mg | ORAL_TABLET | Freq: Once | ORAL | Status: AC
  Administered 2016-10-26: 1000 mg via ORAL

## 2016-10-26 MED ORDER — SODIUM CHLORIDE 0.9 % IV BOLUS (SEPSIS)
1000.0000 mL | Freq: Once | INTRAVENOUS | Status: AC
  Administered 2016-10-26: 1000 mL via INTRAVENOUS

## 2016-10-26 MED ORDER — VANCOMYCIN HCL IN DEXTROSE 1-5 GM/200ML-% IV SOLN
1000.0000 mg | Freq: Once | INTRAVENOUS | Status: AC
  Administered 2016-10-26: 1000 mg via INTRAVENOUS
  Filled 2016-10-26: qty 200

## 2016-10-26 MED ORDER — PIPERACILLIN-TAZOBACTAM 3.375 G IVPB 30 MIN
3.3750 g | Freq: Once | INTRAVENOUS | Status: AC
Start: 2016-10-26 — End: 2016-10-26
  Administered 2016-10-26: 3.375 g via INTRAVENOUS
  Filled 2016-10-26: qty 50

## 2016-10-26 NOTE — ED Notes (Signed)
Patient transported to X-ray 

## 2016-10-26 NOTE — ED Triage Notes (Signed)
Pt reports cough for two weeks and fever today up to 102. Pt reports history of bleeding ulcerative colitis. Pt reports constant diarrhea.

## 2016-10-26 NOTE — ED Notes (Signed)
Attempted to call patient's wife per request to update her, no answer.

## 2016-10-26 NOTE — ED Notes (Signed)
Patient calling that he's freezing.  Temperature check, 97.7, patient given warm blankets.

## 2016-10-26 NOTE — ED Provider Notes (Signed)
[facility] Emergency Department Provider Note   ____________________________________________   I have reviewed the triage vital signs and the nursing notes.   HISTORY  Chief Complaint Fever and Cough   History limited by: Not Limited   HPI David [name] is a 71 y.o. male who presents to the emergency department today because of concern for feeling unwell and chills. The patient states that he has not been feeling well for a while. He has a history of UC and states that he has had a flare for the past 3-4 weeks. Has been seen for this and is on steroids. The patient states that he came in today because of chills. First had chills last night and then had them again today. He had measured fever of 101. The patient states that he went to his doctor last week who ordered antibiotics but he was only able to take the first dose today. Not quite clear what infection the doctor was treating but the patient thinks perhaps sinusitis.    Past Medical History:  Diagnosis Date  . Anemia   . CAD (coronary artery disease) 07/08/2011   Overview:  a. 07/2006: Anterior ST elevation MI. b. 07/2006: PCI with BMS to LAD and RCA. c. 09/2006: Non ST elevation. d. LVEF 30%.  Discussed ICD, not currently interested.   . 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 Dyslipidemia 07/30/2011   Overview:  High triglycerides  . GERD (gastroesophageal reflux disease) 07/08/2011  . History of Clostridium difficile colitis   . History of myocardial infarction   . Hyperlipidemia, unspecified 07/08/2011  . Hypothyroidism   . 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 Type II diabetes mellitus, uncontrolled ([place]) 07/08/2011  . Vitamin D deficiency     Patient Active Problem List   Diagnosis Date Noted  . Ulcerative colitis ([place]) 10/06/2016  . Protein-calorie malnutrition, severe 09/29/2016  . Ulcerative colitis with complication ([place])   .  Lower GI bleed   . Colitis 09/28/2016  . Anal or rectal pain   . Idiopathic chronic inflammatory bowel disease   . Ulceration of intestine   . Moderate chronic ulcerative colitis with rectal bleeding ([place])   . 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 Dyslipidemia 07/30/2011  . CAD (coronary artery disease) 07/08/2011  . GERD (gastroesophageal reflux disease) 07/08/2011  . Hyperlipidemia, unspecified 07/08/2011  . 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 Type II diabetes mellitus, uncontrolled ([place]) 07/08/2011    Past Surgical History:  Procedure Laterality Date  . CARDIAC CATHETERIZATION    . CHOLECYSTECTOMY    . COLONOSCOPY  07/20/2006   Crohn's disease  . CORONARY STENT PLACEMENT    . FLEXIBLE SIGMOIDOSCOPY N/A 09/25/2016   Procedure: FLEXIBLE SIGMOIDOSCOPY;  Surgeon: [name], MD;  Location: [facility] ENDOSCOPY;  Service: Endoscopy;  Laterality: N/A;  . MELANOMA REMOVED    . parotid gland removal      Prior to Admission medications   Medication Sig Start Date End Date Taking? Authorizing Provider  ACCU-CHEK AVIVA PLUS test strip  09/01/16   Historical Provider, MD  aspirin EC 81 MG tablet Take 81 mg by mouth 2 (two) times daily.     Historical Provider, MD  atorvastatin (LIPITOR) 40 MG tablet Take 40 mg by mouth daily.  05/21/16   Historical Provider, MD  carvedilol (COREG) 3.125 MG tablet Take 3.125 mg by mouth 2 (two) times daily  with a meal.  05/21/16   Historical Provider, MD  Cholecalciferol (VITAMIN D) 2000 units CAPS Take 1 capsule by mouth daily.  08/01/09   Historical Provider, MD  glipiZIDE (GLUCOTROL) 10 MG tablet Take 10 mg by mouth 2 (two) times daily.  05/21/16   Historical Provider, MD  levothyroxine (SYNTHROID, LEVOTHROID) 50 MCG tablet Take 50 mcg by mouth daily before breakfast.  05/21/16   Historical Provider, MD  Multiple Vitamins-Minerals (MULTIVITAMIN ADULT  PO) Take 1 tablet by mouth daily.  06/25/08   Historical Provider, MD  Omega-3 Fatty Acids (FISH OIL PO) Take 1 capsule by mouth 2 (two) times daily.  06/25/08   Historical Provider, MD  omeprazole (PRILOSEC) 20 MG capsule Take 20 mg by mouth daily.  05/21/16   Historical Provider, MD  predniSONE (DELTASONE) 10 MG tablet 40 mg daily for now till you see the GI doctor 10/08/16   Fritzi Mandes, MD  ramipril (ALTACE) 5 MG capsule Take 5 mg by mouth daily.  05/21/16   Historical Provider, MD    Allergies Codeine  Family History  Problem Relation Age of Onset  . Heart disease Mother   . Heart attack Father   . Heart disease Sister   . Heart disease Brother     Social History Social History  Substance Use Topics  . Smoking status: Never Smoker  . Smokeless tobacco: Never Used  . Alcohol use No    Review of Systems  Constitutional: Positive for fever and chills. Cardiovascular: Negative for chest pain. Respiratory: Positive for shortness of breath. Gastrointestinal: Positive for abdominal pain. Neurological: Negative for headaches, focal weakness or numbness.  10-point ROS otherwise negative.  ____________________________________________   PHYSICAL EXAM:  VITAL SIGNS: ED Triage Vitals  Enc Vitals Group     BP 10/26/16 1742 (!) 96/53     Pulse Rate 10/26/16 1742 (!) 109     Resp 10/26/16 1742 18     Temp 10/26/16 1742 98 F (36.7 C)     Temp Source 10/26/16 1742 Oral     SpO2 10/26/16 1742 97 %     Weight 10/26/16 1743 175 lb (79.4 kg)     Height 10/26/16 1743 6' (1.829 m)     Head Circumference --      Peak Flow --      Pain Score 10/26/16 1743 5   Constitutional: Alert and oriented. Well appearing and in no distress. Eyes: Conjunctivae are normal. Normal extraocular movements. ENT   Head: Normocephalic and atraumatic.   Nose: No congestion/rhinnorhea.   Mouth/Throat: Mucous membranes are moist.   Neck: No stridor. Hematological/Lymphatic/Immunilogical:  No cervical lymphadenopathy. Cardiovascular: tachcyardic, regular rhythm.  No murmurs, rubs, or gallops.  Respiratory: Normal respiratory effort without tachypnea nor retractions. Breath sounds are clear and equal bilaterally. No wheezes/rales/rhonchi. Gastrointestinal: Soft and slightly tender to palpation diffusely. Genitourinary: Deferred Musculoskeletal: Normal range of motion in all extremities. No lower extremity edema. Neurologic:  Normal speech and language. No gross focal neurologic deficits are appreciated.  Skin:  Skin is warm, dry and intact. No rash noted. Psychiatric: Mood and affect are normal. Speech and behavior are normal. Patient exhibits appropriate insight and judgment.  ____________________________________________    LABS (pertinent positives/negatives)  Labs Reviewed  COMPREHENSIVE METABOLIC PANEL - Abnormal; Notable for the following:       Result Value   Sodium 132 (*)    Chloride 98 (*)    Glucose, Bld 429 (*)    Calcium 8.0 (*)  Total Protein 5.9 (*)    Albumin 2.3 (*)    ALT 15 (*)    All other components within normal limits  LACTIC ACID, PLASMA - Abnormal; Notable for the following:    Lactic Acid, Venous 2.9 (*)    All other components within normal limits  CBC WITH DIFFERENTIAL/PLATELET - Abnormal; Notable for the following:    WBC 11.9 (*)    RBC 3.58 (*)    Hemoglobin 10.9 (*)    HCT 32.0 (*)    RDW 14.6 (*)    Neutro Abs 11.6 (*)    Lymphs Abs 0.1 (*)    All other components within normal limits  BLOOD GAS, VENOUS - Abnormal; Notable for the following:    pH, Ven 7.47 (*)    pCO2, Ven 36 (*)    pO2, Ven <31.0 (*)    Acid-Base Excess 2.7 (*)    All other components within normal limits  CULTURE, BLOOD (ROUTINE X 2)  CULTURE, BLOOD (ROUTINE X 2)  URINE CULTURE  PROTIME-INR  INFLUENZA PANEL BY PCR (TYPE A & B)  LACTIC ACID, PLASMA  URINALYSIS, COMPLETE (UACMP) WITH MICROSCOPIC      ____________________________________________   EKG  None  ____________________________________________    RADIOLOGY  CXR IMPRESSION:  No active cardiopulmonary process.        ____________________________________________   PROCEDURES  Procedures  ____________________________________________   INITIAL IMPRESSION / ASSESSMENT AND PLAN / ED COURSE  Pertinent labs & imaging results that were available during my care of the patient were reviewed by me and considered in my medical decision making (see chart for details).  Patient presented to the emergency department today because of concerns primarily for chills and feeling weak. On exam patient is slightly tachycardic. Patient had somewhat diffuse abdominal tenderness but states that he has been dealing with an ulcerative colitis file for the past 4 weeks. His wife was sick with similar symptoms. Patient states he did not receive a flu shot this year. Will plan on giving IV fluids. Will send off blood cultures. Will send off an influenza. At this point I think influenza likely.   Influenza was negative. Patient lactic however was elevated. Given the patient was having some abdominal tenderness even though it does not appear to be acute processes will get a CT scan. Additionally will give broad-spectrum antibiotics. Will likely require admission.  ____________________________________________   FINAL CLINICAL IMPRESSION(S) / ED DIAGNOSES  Final diagnoses:  Fever, unspecified fever cause  Elevated lactic acid level     Note: This dictation was prepared with Dragon dictation. Any transcriptional errors that result from this process are unintentional     Nance Pear, MD 10/26/16 2027

## 2016-10-27 ENCOUNTER — Encounter: Payer: Self-pay | Admitting: *Deleted

## 2016-10-27 ENCOUNTER — Telehealth: Payer: Self-pay | Admitting: Gastroenterology

## 2016-10-27 DIAGNOSIS — Z885 Allergy status to narcotic agent status: Secondary | ICD-10-CM | POA: Diagnosis not present

## 2016-10-27 DIAGNOSIS — Z955 Presence of coronary angioplasty implant and graft: Secondary | ICD-10-CM | POA: Diagnosis not present

## 2016-10-27 DIAGNOSIS — R509 Fever, unspecified: Secondary | ICD-10-CM | POA: Diagnosis not present

## 2016-10-27 DIAGNOSIS — A419 Sepsis, unspecified organism: Secondary | ICD-10-CM | POA: Diagnosis present

## 2016-10-27 DIAGNOSIS — R7989 Other specified abnormal findings of blood chemistry: Secondary | ICD-10-CM | POA: Diagnosis present

## 2016-10-27 DIAGNOSIS — E034 Atrophy of thyroid (acquired): Secondary | ICD-10-CM | POA: Diagnosis present

## 2016-10-27 DIAGNOSIS — I429 Cardiomyopathy, unspecified: Secondary | ICD-10-CM | POA: Diagnosis present

## 2016-10-27 DIAGNOSIS — E119 Type 2 diabetes mellitus without complications: Secondary | ICD-10-CM | POA: Diagnosis present

## 2016-10-27 DIAGNOSIS — K55069 Acute infarction of intestine, part and extent unspecified: Secondary | ICD-10-CM

## 2016-10-27 DIAGNOSIS — E876 Hypokalemia: Secondary | ICD-10-CM | POA: Diagnosis not present

## 2016-10-27 DIAGNOSIS — K5100 Ulcerative (chronic) pancolitis without complications: Secondary | ICD-10-CM | POA: Diagnosis not present

## 2016-10-27 DIAGNOSIS — Z79899 Other long term (current) drug therapy: Secondary | ICD-10-CM | POA: Diagnosis not present

## 2016-10-27 DIAGNOSIS — Z7189 Other specified counseling: Secondary | ICD-10-CM | POA: Diagnosis not present

## 2016-10-27 DIAGNOSIS — K551 Chronic vascular disorders of intestine: Secondary | ICD-10-CM | POA: Diagnosis not present

## 2016-10-27 DIAGNOSIS — I10 Essential (primary) hypertension: Secondary | ICD-10-CM | POA: Diagnosis not present

## 2016-10-27 DIAGNOSIS — Z7952 Long term (current) use of systemic steroids: Secondary | ICD-10-CM | POA: Diagnosis not present

## 2016-10-27 DIAGNOSIS — R040 Epistaxis: Secondary | ICD-10-CM | POA: Diagnosis not present

## 2016-10-27 DIAGNOSIS — K51911 Ulcerative colitis, unspecified with rectal bleeding: Secondary | ICD-10-CM | POA: Diagnosis not present

## 2016-10-27 DIAGNOSIS — Z7982 Long term (current) use of aspirin: Secondary | ICD-10-CM | POA: Diagnosis not present

## 2016-10-27 DIAGNOSIS — Z9049 Acquired absence of other specified parts of digestive tract: Secondary | ICD-10-CM | POA: Diagnosis not present

## 2016-10-27 DIAGNOSIS — K55059 Acute (reversible) ischemia of intestine, part and extent unspecified: Secondary | ICD-10-CM | POA: Diagnosis present

## 2016-10-27 DIAGNOSIS — Z8249 Family history of ischemic heart disease and other diseases of the circulatory system: Secondary | ICD-10-CM | POA: Diagnosis not present

## 2016-10-27 DIAGNOSIS — E785 Hyperlipidemia, unspecified: Secondary | ICD-10-CM | POA: Diagnosis present

## 2016-10-27 DIAGNOSIS — I472 Ventricular tachycardia: Secondary | ICD-10-CM | POA: Diagnosis present

## 2016-10-27 DIAGNOSIS — Z515 Encounter for palliative care: Secondary | ICD-10-CM | POA: Diagnosis not present

## 2016-10-27 DIAGNOSIS — K219 Gastro-esophageal reflux disease without esophagitis: Secondary | ICD-10-CM | POA: Diagnosis present

## 2016-10-27 DIAGNOSIS — I81 Portal vein thrombosis: Secondary | ICD-10-CM | POA: Diagnosis present

## 2016-10-27 DIAGNOSIS — I252 Old myocardial infarction: Secondary | ICD-10-CM | POA: Diagnosis not present

## 2016-10-27 DIAGNOSIS — Z7984 Long term (current) use of oral hypoglycemic drugs: Secondary | ICD-10-CM | POA: Diagnosis not present

## 2016-10-27 DIAGNOSIS — I2510 Atherosclerotic heart disease of native coronary artery without angina pectoris: Secondary | ICD-10-CM | POA: Diagnosis not present

## 2016-10-27 DIAGNOSIS — A4151 Sepsis due to Escherichia coli [E. coli]: Secondary | ICD-10-CM | POA: Diagnosis present

## 2016-10-27 LAB — CBC
HEMATOCRIT: 28.3 % — AB (ref 40.0–52.0)
HEMOGLOBIN: 10 g/dL — AB (ref 13.0–18.0)
MCH: 31.7 pg (ref 26.0–34.0)
MCHC: 35.5 g/dL (ref 32.0–36.0)
MCV: 89.3 fL (ref 80.0–100.0)
Platelets: 164 10*3/uL (ref 150–440)
RBC: 3.16 MIL/uL — AB (ref 4.40–5.90)
RDW: 14.6 % — ABNORMAL HIGH (ref 11.5–14.5)
WBC: 10.7 10*3/uL — ABNORMAL HIGH (ref 3.8–10.6)

## 2016-10-27 LAB — BLOOD GAS, VENOUS
Acid-Base Excess: 2.7 mmol/L — ABNORMAL HIGH (ref 0.0–2.0)
BICARBONATE: 26.2 mmol/L (ref 20.0–28.0)
FIO2: 0.21
PATIENT TEMPERATURE: 37
pCO2, Ven: 36 mmHg — ABNORMAL LOW (ref 44.0–60.0)
pH, Ven: 7.47 — ABNORMAL HIGH (ref 7.250–7.430)

## 2016-10-27 LAB — GASTROINTESTINAL PANEL BY PCR, STOOL (REPLACES STOOL CULTURE)
Adenovirus F40/41: NOT DETECTED
Astrovirus: NOT DETECTED
Campylobacter species: NOT DETECTED
Cryptosporidium: NOT DETECTED
Cyclospora cayetanensis: NOT DETECTED
ENTEROTOXIGENIC E COLI (ETEC): NOT DETECTED
Entamoeba histolytica: NOT DETECTED
Enteroaggregative E coli (EAEC): NOT DETECTED
Enteropathogenic E coli (EPEC): NOT DETECTED
Giardia lamblia: NOT DETECTED
NOROVIRUS GI/GII: NOT DETECTED
Plesimonas shigelloides: NOT DETECTED
ROTAVIRUS A: NOT DETECTED
SALMONELLA SPECIES: NOT DETECTED
SAPOVIRUS (I, II, IV, AND V): NOT DETECTED
SHIGA LIKE TOXIN PRODUCING E COLI (STEC): NOT DETECTED
SHIGELLA/ENTEROINVASIVE E COLI (EIEC): NOT DETECTED
Vibrio cholerae: NOT DETECTED
Vibrio species: NOT DETECTED
Yersinia enterocolitica: NOT DETECTED

## 2016-10-27 LAB — GLUCOSE, CAPILLARY
Glucose-Capillary: 127 mg/dL — ABNORMAL HIGH (ref 65–99)
Glucose-Capillary: 159 mg/dL — ABNORMAL HIGH (ref 65–99)
Glucose-Capillary: 175 mg/dL — ABNORMAL HIGH (ref 65–99)
Glucose-Capillary: 206 mg/dL — ABNORMAL HIGH (ref 65–99)
Glucose-Capillary: 88 mg/dL (ref 65–99)

## 2016-10-27 LAB — LACTIC ACID, PLASMA: Lactic Acid, Venous: 2.1 mmol/L (ref 0.5–1.9)

## 2016-10-27 LAB — APTT: APTT: 36 s (ref 24–36)

## 2016-10-27 LAB — C DIFFICILE QUICK SCREEN W PCR REFLEX
C DIFFICILE (CDIFF) INTERP: NOT DETECTED
C DIFFICILE (CDIFF) TOXIN: NEGATIVE
C Diff antigen: NEGATIVE

## 2016-10-27 LAB — HEPARIN LEVEL (UNFRACTIONATED)
Heparin Unfractionated: 0.69 IU/mL (ref 0.30–0.70)
Heparin Unfractionated: 0.83 IU/mL — ABNORMAL HIGH (ref 0.30–0.70)

## 2016-10-27 LAB — TSH: TSH: 2.999 u[IU]/mL (ref 0.350–4.500)

## 2016-10-27 MED ORDER — ONDANSETRON HCL 4 MG/2ML IJ SOLN: 4.0000 mg | Freq: Four times a day (QID) | INTRAMUSCULAR | Status: DC | PRN

## 2016-10-27 MED ORDER — SODIUM CHLORIDE 0.9% FLUSH
3.0000 mL | Freq: Two times a day (BID) | INTRAVENOUS | Status: DC
  Administered 2016-10-28 – 2016-11-04 (×10): 3 mL via INTRAVENOUS

## 2016-10-27 MED ORDER — SODIUM CHLORIDE 0.9 % IV SOLN
INTRAVENOUS | Status: DC
  Administered 2016-10-27 (×3): via INTRAVENOUS

## 2016-10-27 MED ORDER — ASPIRIN EC 81 MG PO TBEC
81.0000 mg | DELAYED_RELEASE_TABLET | Freq: Two times a day (BID) | ORAL | Status: DC
  Administered 2016-10-27 – 2016-11-04 (×16): 81 mg via ORAL
  Filled 2016-10-27 (×19): qty 1

## 2016-10-27 MED ORDER — LEVOTHYROXINE SODIUM 50 MCG PO TABS
50.0000 ug | ORAL_TABLET | Freq: Every day | ORAL | Status: DC
  Administered 2016-10-27 – 2016-11-04 (×9): 50 ug via ORAL
  Filled 2016-10-27 (×9): qty 1

## 2016-10-27 MED ORDER — VITAMIN D 1000 UNITS PO TABS
2000.0000 [IU] | ORAL_TABLET | Freq: Every day | ORAL | Status: DC
  Administered 2016-10-27 – 2016-11-04 (×9): 2000 [IU] via ORAL
  Filled 2016-10-27 (×9): qty 2

## 2016-10-27 MED ORDER — DOCUSATE SODIUM 100 MG PO CAPS
100.0000 mg | ORAL_CAPSULE | Freq: Two times a day (BID) | ORAL | Status: DC
  Administered 2016-10-27: 100 mg via ORAL
  Filled 2016-10-27 (×5): qty 1

## 2016-10-27 MED ORDER — VANCOMYCIN HCL IN DEXTROSE 1-5 GM/200ML-% IV SOLN
1000.0000 mg | Freq: Two times a day (BID) | INTRAVENOUS | Status: DC
  Administered 2016-10-27 (×2): 1000 mg via INTRAVENOUS
  Filled 2016-10-27 (×3): qty 200

## 2016-10-27 MED ORDER — ATORVASTATIN CALCIUM 20 MG PO TABS
40.0000 mg | ORAL_TABLET | Freq: Every day | ORAL | Status: DC
  Administered 2016-10-27 – 2016-11-04 (×9): 40 mg via ORAL
  Filled 2016-10-27 (×9): qty 2

## 2016-10-27 MED ORDER — HEPARIN (PORCINE) IN NACL 100-0.45 UNIT/ML-% IJ SOLN
1350.0000 [IU]/h | INTRAMUSCULAR | Status: DC
  Administered 2016-10-27: 1200 [IU]/h via INTRAVENOUS
  Administered 2016-10-27: 1150 [IU]/h via INTRAVENOUS
  Administered 2016-10-28: 1000 [IU]/h via INTRAVENOUS
  Administered 2016-10-29 – 2016-10-30 (×2): 1150 [IU]/h via INTRAVENOUS
  Administered 2016-10-31: 1500 [IU]/h via INTRAVENOUS
  Filled 2016-10-27 (×13): qty 250

## 2016-10-27 MED ORDER — PANTOPRAZOLE SODIUM 40 MG PO TBEC
40.0000 mg | DELAYED_RELEASE_TABLET | Freq: Every day | ORAL | Status: DC
  Administered 2016-10-27 – 2016-11-04 (×9): 40 mg via ORAL
  Filled 2016-10-27 (×10): qty 1

## 2016-10-27 MED ORDER — HEPARIN (PORCINE) IN NACL 100-0.45 UNIT/ML-% IJ SOLN: 16.0000 [IU]/kg/h | Freq: Once | INTRAMUSCULAR | Status: DC

## 2016-10-27 MED ORDER — MEROPENEM-SODIUM CHLORIDE 1 GM/50ML IV SOLR
1.0000 g | Freq: Three times a day (TID) | INTRAVENOUS | Status: DC
  Administered 2016-10-27 – 2016-10-30 (×8): 1 g via INTRAVENOUS
  Filled 2016-10-27 (×10): qty 50

## 2016-10-27 MED ORDER — RAMIPRIL 5 MG PO CAPS
5.0000 mg | ORAL_CAPSULE | Freq: Every day | ORAL | Status: DC
  Administered 2016-10-27 – 2016-11-04 (×8): 5 mg via ORAL
  Filled 2016-10-27 (×9): qty 1

## 2016-10-27 MED ORDER — ACETAMINOPHEN 650 MG RE SUPP: 650.0000 mg | Freq: Four times a day (QID) | RECTAL | Status: DC | PRN

## 2016-10-27 MED ORDER — ACETAMINOPHEN 325 MG PO TABS
650.0000 mg | ORAL_TABLET | Freq: Four times a day (QID) | ORAL | Status: DC | PRN
  Administered 2016-10-27 – 2016-11-04 (×3): 650 mg via ORAL
  Filled 2016-10-27 (×3): qty 2

## 2016-10-27 MED ORDER — HEPARIN BOLUS VIA INFUSION
4000.0000 [IU] | Freq: Once | INTRAVENOUS | Status: AC
  Administered 2016-10-27: 4000 [IU] via INTRAVENOUS
  Filled 2016-10-27: qty 4000

## 2016-10-27 MED ORDER — INSULIN ASPART 100 UNIT/ML ~~LOC~~ SOLN
0.0000 [IU] | Freq: Four times a day (QID) | SUBCUTANEOUS | Status: DC
  Administered 2016-10-27 (×2): 3 [IU] via SUBCUTANEOUS
  Administered 2016-10-27 – 2016-10-28 (×3): 2 [IU] via SUBCUTANEOUS
  Administered 2016-10-29: 5 [IU] via SUBCUTANEOUS
  Administered 2016-10-29: 2 [IU] via SUBCUTANEOUS
  Administered 2016-10-29: 3 [IU] via SUBCUTANEOUS
  Filled 2016-10-27 (×3): qty 2
  Filled 2016-10-27 (×3): qty 3
  Filled 2016-10-27: qty 2
  Filled 2016-10-27: qty 5

## 2016-10-27 MED ORDER — SODIUM CHLORIDE 0.9 % IV BOLUS (SEPSIS)
500.0000 mL | Freq: Once | INTRAVENOUS | Status: AC
  Administered 2016-10-27: 500 mL via INTRAVENOUS

## 2016-10-27 MED ORDER — INSULIN ASPART 100 UNIT/ML ~~LOC~~ SOLN: 0.0000 [IU] | Freq: Every day | SUBCUTANEOUS | Status: DC

## 2016-10-27 MED ORDER — PIPERACILLIN-TAZOBACTAM 3.375 G IVPB
3.3750 g | Freq: Three times a day (TID) | INTRAVENOUS | Status: DC
Start: 2016-10-27 — End: 2016-10-27
  Administered 2016-10-27 (×2): 3.375 g via INTRAVENOUS
  Filled 2016-10-27 (×3): qty 50

## 2016-10-27 MED ORDER — CARVEDILOL 6.25 MG PO TABS
3.1250 mg | ORAL_TABLET | Freq: Two times a day (BID) | ORAL | Status: DC
  Administered 2016-10-27 – 2016-11-04 (×17): 3.125 mg via ORAL
  Filled 2016-10-27 (×19): qty 1

## 2016-10-27 MED ORDER — ONDANSETRON HCL 4 MG PO TABS: 4.0000 mg | ORAL_TABLET | Freq: Four times a day (QID) | ORAL | Status: DC | PRN

## 2016-10-27 NOTE — Telephone Encounter (Signed)
Seen him as a consult today

## 2016-10-27 NOTE — Progress Notes (Signed)
ANTICOAGULATION CONSULT NOTE - Initial Consult  Pharmacy Consult for heparin Indication: thrombosis of proximal inferior mesenteric vein  Allergies  Allergen Reactions  . Codeine Nausea And Vomiting and Other (See Comments)    Patient Measurements: Height: 6' (182.9 cm) Weight: 175 lb (79.4 kg) IBW/kg (Calculated) : 77.6 Heparin Dosing Weight: 79.4 kg  Vital Signs: Temp: 101.8 F (38.8 C) (01/15 2240) Temp Source: Oral (01/15 2240) BP: 102/44 (01/16 0020) Pulse Rate: 102 (01/16 0020)  Labs:  Recent Labs  10/26/16 1811  HGB 10.9*  HCT 32.0*  PLT 266  LABPROT 14.4  INR 1.11  CREATININE 1.12    Estimated Creatinine Clearance: 67.4 mL/min (by C-G formula based on SCr of 1.12 mg/dL).   Medical History: Past Medical History:  Diagnosis Date  . Anemia   . CAD (coronary artery disease) 07/08/2011   Overview:  a. 07/2006: Anterior ST elevation MI. b. 07/2006: PCI with BMS to LAD and RCA. c. 09/2006: Non ST elevation. d. LVEF 30%.  Discussed ICD, not currently interested.   . 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 Dyslipidemia 07/30/2011   Overview:  High triglycerides  . GERD (gastroesophageal reflux disease) 07/08/2011  . History of Clostridium difficile colitis   . History of myocardial infarction   . Hyperlipidemia, unspecified 07/08/2011  . Hypothyroidism   . 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 Type II diabetes mellitus, uncontrolled ([place]) 07/08/2011  . Vitamin D deficiency     Medications:  Infusions:  . heparin      Assessment: 70 yom cc fever and cough. Sepsis with UC, on abx. 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 No AC on PTA list. Some recent bleeding related to UC, Hgb 10.9 on 1/15. Continue to follow Hgb.   Goal of Therapy:  Heparin level 0.3-0.7 units/ml Monitor platelets by anticoagulation protocol: Yes   Plan:  Give 4000 units bolus x  1 Start heparin infusion at 1200 units/hr Check anti-Xa level in 8 hours and daily while on heparin Continue to monitor H&H and platelets  Laural Benes, Pharm.D., BCPS Clinical Pharmacist 10/27/2016,1:23 AM

## 2016-10-27 NOTE — H&P (Signed)
David [name] is an 71 y.o. male.   Chief Complaint: Chills HPI: The patient with past medical history of ulcerative colitis presents to the emergency department after feeling unable to cope with chills and subjective fever. Upon arrival the patient was febrile to 103.55F. He states that he felt mild improvement since his last hospitalization but developed worsening diarrhea a few days prior to admission. The holiday weekend prevented him from getting oral antibiotics until yesterday. Since that time he has had some painful watery bowel movements. He denies seeing any blood in his stool. CT of the abdomen showed inflammation of the transverse colon as well as a clot in the inferior mesenteric vein. The surgery service was consulted who recommended initiation of heparin and the emergency department staff called the hospitalist service for admission.  Past Medical History:  Diagnosis Date  . Anemia   . CAD (coronary artery disease) 07/08/2011   Overview:  a. 07/2006: Anterior ST elevation MI. b. 07/2006: PCI with BMS to LAD and RCA. c. 09/2006: Non ST elevation. d. LVEF 30%.  Discussed ICD, not currently interested.   . 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 Dyslipidemia 07/30/2011   Overview:  High triglycerides  . GERD (gastroesophageal reflux disease) 07/08/2011  . History of Clostridium difficile colitis   . History of myocardial infarction   . Hyperlipidemia, unspecified 07/08/2011  . Hypothyroidism   . 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 Type II diabetes mellitus, uncontrolled ([place]) 07/08/2011  . Vitamin D deficiency     Past Surgical History:  Procedure Laterality Date  . CARDIAC CATHETERIZATION    . CHOLECYSTECTOMY    . COLONOSCOPY  07/20/2006   Crohn's disease  . CORONARY STENT PLACEMENT    . FLEXIBLE SIGMOIDOSCOPY N/A 09/25/2016   Procedure: FLEXIBLE SIGMOIDOSCOPY;  Surgeon: [name], MD;  Location: [facility] ENDOSCOPY;   Service: Endoscopy;  Laterality: N/A;  . MELANOMA REMOVED    . parotid gland removal      Family History  Problem Relation Age of Onset  . Heart disease Mother   . Heart attack Father   . Heart disease Sister   . Heart disease Brother    Social History:  reports that he has never smoked. He has never used smokeless tobacco. He reports that he does not drink alcohol or use drugs.  Allergies:  Allergies  Allergen Reactions  . Codeine Nausea And Vomiting and Other (See Comments)    Medications Prior to Admission  Medication Sig Dispense Refill  . ACCU-CHEK AVIVA PLUS test strip 1 each by Other route as needed for other.     Marland Kitchen aspirin EC 81 MG tablet Take 81 mg by mouth 2 (two) times daily.     Marland Kitchen atorvastatin (LIPITOR) 40 MG tablet Take 40 mg by mouth daily.     . benzonatate (TESSALON) 100 MG capsule Take 100 mg by mouth 3 (three) times daily as needed for cough.    . carvedilol (COREG) 3.125 MG tablet Take 3.125 mg by mouth 2 (two) times daily with a meal.     . cefUROXime (CEFTIN) 250 MG tablet Take 250 mg by mouth 2 (two) times daily.    . Cholecalciferol (VITAMIN D) 2000 units CAPS Take 1 capsule by mouth daily.     Marland Kitchen glipiZIDE (GLUCOTROL) 10 MG tablet Take 10 mg by mouth 2 (two) times daily.     Marland Kitchen levothyroxine (SYNTHROID, LEVOTHROID) 50 MCG tablet Take 50 mcg by mouth daily  before breakfast.     . Multiple Vitamins-Minerals (MULTIVITAMIN ADULT PO) Take 1 tablet by mouth daily.     . Omega-3 Fatty Acids (FISH OIL PO) Take 1 capsule by mouth 2 (two) times daily.     Marland Kitchen omeprazole (PRILOSEC) 20 MG capsule Take 20 mg by mouth daily.     . ramipril (ALTACE) 5 MG capsule Take 5 mg by mouth daily.       Results for orders placed or performed during the hospital encounter of 10/26/16 (from the past 48 hour(s))  Comprehensive metabolic panel     Status: Abnormal   Collection Time: 10/26/16  6:11 PM  Result Value Ref Range   Sodium 132 (L) 135 - 145 mmol/L   Potassium 3.8 3.5 - 5.1  mmol/L   Chloride 98 (L) 101 - 111 mmol/L   CO2 24 22 - 32 mmol/L   Glucose, Bld 429 (H) 65 - 99 mg/dL   BUN 16 6 - 20 mg/dL   Creatinine, Ser 1.12 0.61 - 1.24 mg/dL   Calcium 8.0 (L) 8.9 - 10.3 mg/dL   Total Protein 5.9 (L) 6.5 - 8.1 g/dL   Albumin 2.3 (L) 3.5 - 5.0 g/dL   AST 25 15 - 41 U/L   ALT 15 (L) 17 - 63 U/L   Alkaline Phosphatase 121 38 - 126 U/L   Total Bilirubin 0.9 0.3 - 1.2 mg/dL   GFR calc non Af Amer >60 >60 mL/min   GFR calc Af Amer >60 >60 mL/min    Comment: (NOTE) The eGFR has been calculated using the CKD EPI equation. This calculation has not been validated in all clinical situations. eGFR's persistently <60 mL/min signify possible Chronic Kidney Disease.    Anion gap 10 5 - 15  Lactic acid, plasma     Status: Abnormal   Collection Time: 10/26/16  6:11 PM  Result Value Ref Range   Lactic Acid, Venous 2.9 (HH) 0.5 - 1.9 mmol/L    Comment: CRITICAL RESULT CALLED TO, READ BACK BY AND VERIFIED WITH STEVEN JONES 10/26/16 @ 1858  MLK   CBC with Differential     Status: Abnormal   Collection Time: 10/26/16  6:11 PM  Result Value Ref Range   WBC 11.9 (H) 3.8 - 10.6 K/uL   RBC 3.58 (L) 4.40 - 5.90 MIL/uL   Hemoglobin 10.9 (L) 13.0 - 18.0 g/dL   HCT 32.0 (L) 40.0 - 52.0 %   MCV 89.2 80.0 - 100.0 fL   MCH 30.3 26.0 - 34.0 pg   MCHC 34.0 32.0 - 36.0 g/dL   RDW 14.6 (H) 11.5 - 14.5 %   Platelets 266 150 - 440 K/uL   Neutrophils Relative % 98 %   Neutro Abs 11.6 (H) 1.4 - 6.5 K/uL   Lymphocytes Relative 1 %   Lymphs Abs 0.1 (L) 1.0 - 3.6 K/uL   Monocytes Relative 1 %   Monocytes Absolute 0.2 0.2 - 1.0 K/uL   Eosinophils Relative 0 %   Eosinophils Absolute 0.0 0 - 0.7 K/uL   Basophils Relative 0 %   Basophils Absolute 0.0 0 - 0.1 K/uL  Protime-INR     Status: None   Collection Time: 10/26/16  6:11 PM  Result Value Ref Range   Prothrombin Time 14.4 11.4 - 15.2 seconds   INR 1.11   TSH     Status: None   Collection Time: 10/26/16  6:11 PM  Result Value Ref  Range   TSH 2.999 0.350 -  4.500 uIU/mL    Comment: Performed by a 3rd Generation assay with a functional sensitivity of <=0.01 uIU/mL.  Blood gas, venous     Status: Abnormal (Preliminary result)   Collection Time: 10/26/16  6:18 PM  Result Value Ref Range   FIO2 0.21    pH, Ven 7.47 (H) 7.250 - 7.430   pCO2, Ven 36 (L) 44.0 - 60.0 mmHg   pO2, Ven <31.0 (LL) 32.0 - 45.0 mmHg    Comment: CRITICAL RESULT CALLED TO, READ BACK BY AND VERIFIED WITH: DR Archie Balboa AT 1830, 10/26/2016, LS    Bicarbonate 26.2 20.0 - 28.0 mmol/L   Acid-Base Excess 2.7 (H) 0.0 - 2.0 mmol/L   O2 Saturation PENDING %   Patient temperature 37.0    Collection site VENOUS    Sample type VENOUS   Influenza panel by PCR (type A & B)     Status: None   Collection Time: 10/26/16  7:14 PM  Result Value Ref Range   Influenza A By PCR NEGATIVE NEGATIVE   Influenza B By PCR NEGATIVE NEGATIVE    Comment: (NOTE) The Xpert Xpress Flu assay is intended as an aid in the diagnosis of  influenza and should not be used as a sole basis for treatment.  This  assay is FDA approved for nasopharyngeal swab specimens only. Nasal  washings and aspirates are unacceptable for Xpert Xpress Flu testing.   Urinalysis, Complete w Microscopic     Status: Abnormal   Collection Time: 10/26/16  8:30 PM  Result Value Ref Range   Color, Urine YELLOW (A) YELLOW   APPearance CLEAR (A) CLEAR   Specific Gravity, Urine 1.039 (H) 1.005 - 1.030   pH 5.0 5.0 - 8.0   Glucose, UA >=500 (A) NEGATIVE mg/dL   Hgb urine dipstick NEGATIVE NEGATIVE   Bilirubin Urine NEGATIVE NEGATIVE   Ketones, ur 20 (A) NEGATIVE mg/dL   Protein, ur NEGATIVE NEGATIVE mg/dL   Nitrite NEGATIVE NEGATIVE   Leukocytes, UA NEGATIVE NEGATIVE   RBC / HPF 0-5 0 - 5 RBC/hpf   WBC, UA 0-5 0 - 5 WBC/hpf   Bacteria, UA NONE SEEN NONE SEEN   Squamous Epithelial / LPF 0-5 (A) NONE SEEN  Lactic acid, plasma     Status: Abnormal   Collection Time: 10/26/16 10:45 PM  Result Value Ref  Range   Lactic Acid, Venous 5.0 (HH) 0.5 - 1.9 mmol/L    Comment: CRITICAL RESULT CALLED TO, READ BACK BY AND VERIFIED WITH BUTCH WOODS ON  10/26/16 AT 2322 BY TLB   Glucose, capillary     Status: Abnormal   Collection Time: 10/27/16 12:39 AM  Result Value Ref Range   Glucose-Capillary 206 (H) 65 - 99 mg/dL  APTT     Status: None   Collection Time: 10/27/16  1:43 AM  Result Value Ref Range   aPTT 36 24 - 36 seconds  Glucose, capillary     Status: Abnormal   Collection Time: 10/27/16  5:20 AM  Result Value Ref Range   Glucose-Capillary 175 (H) 65 - 99 mg/dL   Dg Chest 2 View  Result Date: 10/26/2016 CLINICAL DATA:  Cough for 2 weeks with fever today. History of ulcerative colitis. EXAM: CHEST  2 VIEW COMPARISON:  Acute abdominal series done 09/29/2016. FINDINGS: The heart size and mediastinal contours are stable. There is a probable coronary artery stent. The lungs are clear. There is no pleural effusion or pneumothorax. Probable old rib fracture laterally on the right. No  acute osseous findings are seen. IMPRESSION: No active cardiopulmonary process. Electronically Signed   By: Richardean Sale M.D.   On: 10/26/2016 18:43   Ct Abdomen Pelvis W Contrast  Result Date: 10/26/2016 CLINICAL DATA:  Malaise, chills. History of ulcerative colitis and has had a flare for the past 3-4 weeks. Patient is on steroids. EXAM: CT ABDOMEN AND PELVIS WITH CONTRAST TECHNIQUE: Multidetector CT imaging of the abdomen and pelvis was performed using the standard protocol following bolus administration of intravenous contrast. CONTRAST:  130m ISOVUE-300 IOPAMIDOL (ISOVUE-300) INJECTION 61% COMPARISON:  09/28/2016 CT FINDINGS: Lower chest: Small hiatal hernia. Top normal-sized cardiac chambers with coronary arteriosclerosis. Bibasilar atelectasis. Minimal chronic right middle lobe scarring. No effusion or pneumothorax. Stable 7 mm right lower lobe lateral nodule. Hepatobiliary: Cholecystectomy with mild intrahepatic  ductal dilatation which can be seen in the setting prior cholecystectomy. No choledocholithiasis. No space-occupying mass of the liver. Pancreas: Unremarkable. No pancreatic ductal dilatation or surrounding inflammatory changes. Spleen: Normal in size without focal abnormality. Adrenals/Urinary Tract: Adrenal glands are unremarkable. Kidneys are normal, without renal calculi, focal lesion, or hydronephrosis. Bladder is unremarkable. Stomach/Bowel: Stomach is moderately distended with contrast. Normal small bowel rotation. No small bowel dilatation to suggest obstruction. Normal-appearing appendix. Transmural thickening of large bowel mid transverse colon through rectum consistent with mild colitis. Vascular/Lymphatic: Acute appearing thrombosis of the proximal inferior mesenteric vein with distention noted along its proximal half draining the sigmoid and rectosigmoid veins terminating at the left common vein, series 5 images 51 through 68. There is mesenteric fat stranding associated with this finding. No lymphadenopathy. Reproductive: Mild prostatomegaly with peripheral zone calcifications. Other: Tiny fat containing umbilical hernia. No abdominopelvic ascites. Musculoskeletal: Degenerative changes of the lumbar spine appear chronic. No acute osseous abnormality. IMPRESSION: 1. 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 2. Diffuse transmural thickening of proximal transverse colon through rectum consistent with colitis. 3. Otherwise chronic stable appearing changes as above. Electronically Signed   By: DAshley RoyaltyM.D.   On: 10/26/2016 23:15    Review of Systems  Constitutional: Positive for chills and fever.  HENT: Negative for sore throat and tinnitus.   Eyes: Negative for blurred vision and redness.  Respiratory: Negative for cough and shortness of breath.   Cardiovascular: Negative for chest pain, palpitations, orthopnea and PND.  Gastrointestinal: Positive for diarrhea. Negative for  abdominal pain, nausea and vomiting.  Genitourinary: Negative for dysuria, frequency and urgency.  Musculoskeletal: Negative for joint pain and myalgias.  Skin: Negative for rash.       No lesions  Neurological: Negative for speech change, focal weakness and weakness.  Endo/Heme/Allergies: Does not bruise/bleed easily.       No temperature intolerance  Psychiatric/Behavioral: Negative for depression and suicidal ideas.    Blood pressure (!) 110/41, pulse 64, temperature (!) 103.2 F (39.6 C), temperature source Oral, resp. rate 20, height 6' (1.829 m), weight 80.7 kg (178 lb), SpO2 92 %. Physical Exam  Nursing note and vitals reviewed. Constitutional: He is oriented to person, place, and time. He appears well-developed and well-nourished. No distress.  HENT:  Head: Normocephalic and atraumatic.  Mouth/Throat: Oropharynx is clear and moist.  Eyes: Conjunctivae and EOM are normal. Pupils are equal, round, and reactive to light. No scleral icterus.  Neck: Normal range of motion. Neck supple. No JVD present. No tracheal deviation present. No thyromegaly present.  Cardiovascular: Regular rhythm and normal heart sounds.  Tachycardia present.  Exam reveals no gallop and no friction rub.  No murmur heard. Respiratory: Effort normal and breath sounds normal. No respiratory distress.  GI: Soft. Bowel sounds are normal. He exhibits no distension. There is no tenderness.  Genitourinary:  Genitourinary Comments: Deferred  Musculoskeletal: Normal range of motion. He exhibits no edema.  Lymphadenopathy:    He has no cervical adenopathy.  Neurological: He is alert and oriented to person, place, and time. No cranial nerve deficit.  Skin: Skin is warm and dry. No rash noted. No erythema.  Psychiatric: He has a normal mood and affect. His behavior is normal. Judgment and thought content normal.     Assessment/Plan This is a 71 year old male admitted for sepsis secondary to colitis and mesenteric  ischemia. 1. Sepsis: The patient meets criteria via temperature, leukocytosis and tachycardia. We have started him on broad-spectrum antibiotics. He is hemodynamically stable. Follow blood cultures for growth and sensitivities. Lactic acid is elevated possibly secondary to mesenteric ischemia. 2. Mesenteric ischemia: Symptoms not classically consistent with this diagnosis however the patient does have a clot initiated on CT angiogram of the abdomen for which we will start heparin. He is not in much pain. 3. Ulcerative colitis: The patient had been in remission for the last 10 years after receiving Remicade. He has been reluctant to receive another treatment with his more recent flares due to concerns regarding malignancy associated with immunotherapy. Endoscopy would not be recommended at this point but once sepsis has resolved consider gastroenterology consult for further guidance. 4. Coronary artery disease: Stable. Continue aspirin 5. Hypertension: Controlled; continue carvedilol and ACE inhibitor 6. Hyperlipidemia: Continue statin therapy 7. Hypothyroidism: Continue Synthroid. Check TSH 8. DVT prophylaxis: Therapeutic anticoagulation as above 9. GI prophylaxis: Pantoprazole per home regimen The patient is a full code. Time spent on admission orders and patient care approximately 45 minutes  Harrie Foreman, MD 10/27/2016, 6:28 AM

## 2016-10-27 NOTE — Progress Notes (Signed)
71 yr old male with history of ulcerative colitis who was improving from flair.  He stated that he had sinus infection and took one antibiotic on MOnday and then got Rigors.  He had CT scan showing improving colitis but with a IMV thrombus.  Patient states abdomen is non tender, feels he has continued to improve.  He has started to have diarrhea stools since entering the hospital but no blood in stool.   Vitals:   10/27/16 1350 10/27/16 1708  BP: (!) 91/48 (!) 98/58  Pulse: 91 85  Resp: 20   Temp: 99.4 F (37.4 C)    I/O last 3 completed shifts: In: 4227.5 [I.V.:1477.5; IV Piggyback:2750] Out: 100 [Urine:100] No intake/output data recorded.   PE:  Gen: NAD Res: CTAB/L  Cardio: RRR Abd: soft, nontender, non distended Ext: No edema  CBC Latest Ref Rng & Units 10/27/2016 10/26/2016 10/07/2016  WBC 3.8 - 10.6 K/uL 10.7(H) 11.9(H) 6.1  Hemoglobin 13.0 - 18.0 g/dL 10.0(L) 10.9(L) 10.9(L)  Hematocrit 40.0 - 52.0 % 28.3(L) 32.0(L) 30.8(L)  Platelets 150 - 440 K/uL 164 266 370   CMP Latest Ref Rng & Units 10/26/2016 10/07/2016 10/06/2016  Glucose 65 - 99 mg/dL 429(H) 271(H) -  BUN 6 - 20 mg/dL 16 13 -  Creatinine 0.61 - 1.24 mg/dL 1.12 0.98 -  Sodium 135 - 145 mmol/L 132(L) 135 -  Potassium 3.5 - 5.1 mmol/L 3.8 4.2 4.7  Chloride 101 - 111 mmol/L 98(L) 103 -  CO2 22 - 32 mmol/L 24 25 -  Calcium 8.9 - 10.3 mg/dL 8.0(L) 7.8(L) -  Total Protein 6.5 - 8.1 g/dL 5.9(L) - -  Total Bilirubin 0.3 - 1.2 mg/dL 0.9 - -  Alkaline Phos 38 - 126 U/L 121 - -  AST 15 - 41 U/L 25 - -  ALT 17 - 63 U/L 15(L) - -    A/P:  71 yr old male with history of ulcerative colitis who was improving from flair.  Patient seems improved from abdomen standpoint, currently non-tender, labs improving,  no need for acute surgical intervention, would continue heparin gtt.  Unsure if 103 temp is purely from colitis standpoint.  I agree with Dr. Wilhemena Durie of GI that if he continues to worsen he may be better served at a tertiary  care center such as [place] or [facility] because he may need a total colectomy and is a complex patient.

## 2016-10-27 NOTE — Progress Notes (Signed)
Patient c/o chills..."I'm freezing..."  Multiple warm blankets given; temp 101.2 orally; tylenol po given; IVF continued at 162m/hr;  "I can't take anymore of this".  HR 130s. TBarbaraann Faster RN 5:32 AM. 10/27/2016

## 2016-10-27 NOTE — Progress Notes (Signed)
ANTICOAGULATION CONSULT NOTE - Initial Consult  Pharmacy Consult for heparin Indication: thrombosis of proximal inferior mesenteric vein  Allergies  Allergen Reactions  . Codeine Nausea And Vomiting and Other (See Comments)    Patient Measurements: Height: 6' (182.9 cm) Weight: 178 lb (80.7 kg) IBW/kg (Calculated) : 77.6 Heparin Dosing Weight: 79.4 kg  Vital Signs: Temp: 99.4 F (37.4 C) (01/16 1350) Temp Source: Oral (01/16 1350) BP: 98/58 (01/16 1708) Pulse Rate: 85 (01/16 1708)  Labs:  Recent Labs  10/26/16 1811 10/27/16 0143 10/27/16 1033 10/27/16 1956  HGB 10.9*  --  10.0*  --   HCT 32.0*  --  28.3*  --   PLT 266  --  164  --   APTT  --  36  --   --   LABPROT 14.4  --   --   --   INR 1.11  --   --   --   HEPARINUNFRC  --   --  0.69 0.83*  CREATININE 1.12  --   --   --     Estimated Creatinine Clearance: 67.4 mL/min (by C-G formula based on SCr of 1.12 mg/dL).   Medical History: Past Medical History:  Diagnosis Date  . Anemia   . CAD (coronary artery disease) 07/08/2011   Overview:  a. 07/2006: Anterior ST elevation MI. b. 07/2006: PCI with BMS to LAD and RCA. c. 09/2006: Non ST elevation. d. LVEF 30%.  Discussed ICD, not currently interested.   . 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 Dyslipidemia 07/30/2011   Overview:  High triglycerides  . GERD (gastroesophageal reflux disease) 07/08/2011  . History of Clostridium difficile colitis   . History of myocardial infarction   . Hyperlipidemia, unspecified 07/08/2011  . Hypothyroidism   . 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 Type II diabetes mellitus, uncontrolled ([place]) 07/08/2011  . Vitamin D deficiency     Medications:  Infusions:  . sodium chloride 150 mL/hr at 10/27/16 1751  . heparin 1,150 Units/hr (10/27/16 1751)    Assessment: 70 yom cc fever and cough. Sepsis with UC, on abx. CT noted acute thrombosis of proximal inferior mesenteric  vein - heparin started in ED. No AC on PTA list. Some recent bleeding related to UC, Hgb 10.9 on 1/15. Continue to follow Hgb.   Goal of Therapy:  Heparin level 0.3-0.7 units/ml Monitor platelets by anticoagulation protocol: Yes   Plan:  HL =0.69, this is therapeutic, but on the upper limits. Will decrease rate just slightly to 1150 units/hr and recheck in 8 hours. Pt is awaiting [facility] or [facility]. Will continue to monitor pt until then.  1/16 1956 HL supratherapeutic. Decrease to 1000 units/hr and recheck HL in 8 hours.  Laural Benes, Pharm.D., BCPS Clinical Pharmacist 10/27/2016,11:15 PM

## 2016-10-27 NOTE — Progress Notes (Signed)
PHARMACY - PHYSICIAN COMMUNICATION CRITICAL VALUE ALERT - BLOOD CULTURE IDENTIFICATION (BCID)  No results found for this or any previous visit.  Name of physician (or Provider) Contacted: Dr. Jannifer Franklin  Changes to prescribed antibiotics required: Change Zosyn to Meropenem   Laural Benes, Pharm.D., BCPS Clinical Pharmacist 10/27/2016  11:01 PM

## 2016-10-27 NOTE — Progress Notes (Signed)
Family Meeting Note  Advance Directive:yes  Today a meeting took place with the Patient.  The following clinical team members were present during this meeting:MD  The following were discussed:Patient's diagnosis: , Patient's progosis: > 12 months and Goals for treatment: Full Code  Additional follow-up to be provided: Continue goals of care discussion  Time spent during discussion:20 minutes  Max Sane, MD

## 2016-10-27 NOTE — Progress Notes (Signed)
Pharmacy Antibiotic Note  David [name] is a 71 y.o. male admitted on 10/26/2016 with sepsis.  Pharmacy has been consulted for zosyn and vancomycin dosing.  Plan: 1. Zosyn 3.375 gm IV Q8H EI 2. Vancomycin 1 gm IV x 1 in ED followed in approximately 6 hours (stacked dosing) by vancomycin 1 gm IV Q12H, predicted trough 17 mcg/mL. Pharmacy will continue to follow and adjust as needed to maintain trough 15 to 20 mcg/mL.   Vd 54.8 L, Ke 0.06 hr-1, T1/2 11.5 hr  Height: 6' (182.9 cm) Weight: 175 lb (79.4 kg) IBW/kg (Calculated) : 77.6  Temp (24hrs), Avg:99.2 F (37.3 C), Min:97.7 F (36.5 C), Max:101.8 F (38.8 C)   Recent Labs Lab 10/26/16 1811 10/26/16 2245  WBC 11.9*  --   CREATININE 1.12  --   LATICACIDVEN 2.9* 5.0*    Estimated Creatinine Clearance: 67.4 mL/min (by C-G formula based on SCr of 1.12 mg/dL).    Allergies  Allergen Reactions  . Codeine Nausea And Vomiting and Other (See Comments)    Thank you for allowing pharmacy to be a part of this patient's care.  Laural Benes, Pharm.D., BCPS Clinical Pharmacist 10/27/2016 3:03 AM

## 2016-10-27 NOTE — Progress Notes (Signed)
Dr.Diamond notified of fever 103.2; Vancomycin initiated; acknowledged. Barbaraann Faster, RN 6:45 AM 10/27/2016

## 2016-10-27 NOTE — Progress Notes (Signed)
[facility] [place] NAME: David [name]    MR#:  [id number]  DATE OF BIRTH:  06/04/1946  SUBJECTIVE:  CHIEF COMPLAINT:   Chief Complaint  Patient presents with  . Fever  . Cough  sleeping comfortably, d/w him possible transfer to [facility] or bigger center and he refuses. Prefers to stay here REVIEW OF SYSTEMS:  Review of Systems  Constitutional: Positive for chills, fever and malaise/fatigue. Negative for weight loss.  HENT: Negative for nosebleeds and sore throat.   Eyes: Negative for blurred vision.  Respiratory: Negative for cough, shortness of breath and wheezing.   Cardiovascular: Negative for chest pain, orthopnea, leg swelling and PND.  Gastrointestinal: Positive for diarrhea. Negative for abdominal pain, constipation, heartburn, nausea and vomiting.  Genitourinary: Negative for dysuria and urgency.  Musculoskeletal: Negative for back pain.  Skin: Negative for rash.  Neurological: Positive for weakness. Negative for dizziness, speech change, focal weakness and headaches.  Endo/Heme/Allergies: Does not bruise/bleed easily.  Psychiatric/Behavioral: Negative for depression.   DRUG ALLERGIES:   Allergies  Allergen Reactions  . Codeine Nausea And Vomiting and Other (See Comments)   VITALS:  Blood pressure (!) 98/58, pulse 85, temperature 99.4 F (37.4 C), temperature source Oral, resp. rate 20, height 6' (1.829 m), weight 80.7 kg (178 lb), SpO2 95 %. PHYSICAL EXAMINATION:  Physical Exam  Constitutional: He is oriented to person, place, and time and well-developed, well-nourished, and in no distress.  HENT:  Head: Normocephalic and atraumatic.  Eyes: Conjunctivae and EOM are normal. Pupils are equal, round, and reactive to light.  Neck: Normal range of motion. Neck supple. No tracheal deviation present. No thyromegaly present.  Cardiovascular: Normal rate, regular rhythm and normal heart sounds.   Pulmonary/Chest: Effort  normal and breath sounds normal. No respiratory distress. He has no wheezes. He exhibits no tenderness.  Abdominal: Soft. Bowel sounds are normal. He exhibits no distension. There is no tenderness.  Musculoskeletal: Normal range of motion.  Neurological: He is alert and oriented to person, place, and time. No cranial nerve deficit.  Skin: Skin is warm and dry. No rash noted.  Psychiatric: Mood and affect normal.   LABORATORY PANEL:   CBC  Recent Labs Lab 10/27/16 1033  WBC 10.7*  HGB 10.0*  HCT 28.3*  PLT 164   ------------------------------------------------------------------------------------------------------------------ Chemistries   Recent Labs Lab 10/26/16 1811  NA 132*  K 3.8  CL 98*  CO2 24  GLUCOSE 429*  BUN 16  CREATININE 1.12  CALCIUM 8.0*  AST 25  ALT 15*  ALKPHOS 121  BILITOT 0.9   RADIOLOGY:  Ct Abdomen Pelvis W Contrast  Result Date: 10/26/2016 CLINICAL DATA:  Malaise, chills. History of ulcerative colitis and has had a flare for the past 3-4 weeks. Patient is on steroids. EXAM: CT ABDOMEN AND PELVIS WITH CONTRAST TECHNIQUE: Multidetector CT imaging of the abdomen and pelvis was performed using the standard protocol following bolus administration of intravenous contrast. CONTRAST:  163m ISOVUE-300 IOPAMIDOL (ISOVUE-300) INJECTION 61% COMPARISON:  09/28/2016 CT FINDINGS: Lower chest: Small hiatal hernia. Top normal-sized cardiac chambers with coronary arteriosclerosis. Bibasilar atelectasis. Minimal chronic right middle lobe scarring. No effusion or pneumothorax. Stable 7 mm right lower lobe lateral nodule. Hepatobiliary: Cholecystectomy with mild intrahepatic ductal dilatation which can be seen in the setting prior cholecystectomy. No choledocholithiasis. No space-occupying mass of the liver. Pancreas: Unremarkable. No pancreatic ductal dilatation or surrounding inflammatory changes. Spleen: Normal in size without focal abnormality. Adrenals/Urinary Tract:  Adrenal  glands are unremarkable. Kidneys are normal, without renal calculi, focal lesion, or hydronephrosis. Bladder is unremarkable. Stomach/Bowel: Stomach is moderately distended with contrast. Normal small bowel rotation. No small bowel dilatation to suggest obstruction. Normal-appearing appendix. Transmural thickening of large bowel mid transverse colon through rectum consistent with mild colitis. Vascular/Lymphatic: Acute appearing thrombosis of the proximal inferior mesenteric vein with distention noted along its proximal half draining the sigmoid and rectosigmoid veins terminating at the left common vein, series 5 images 51 through 68. There is mesenteric fat stranding associated with this finding. No lymphadenopathy. Reproductive: Mild prostatomegaly with peripheral zone calcifications. Other: Tiny fat containing umbilical hernia. No abdominopelvic ascites. Musculoskeletal: Degenerative changes of the lumbar spine appear chronic. No acute osseous abnormality. IMPRESSION: 1. 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 2. Diffuse transmural thickening of proximal transverse colon through rectum consistent with colitis. 3. Otherwise chronic stable appearing changes as above. Electronically Signed   By: Ashley Royalty M.D.   On: 10/26/2016 23:15   ASSESSMENT AND PLAN:  This is a 71 year old male admitted for sepsis secondary to colitis and mesenteric ischemia.  1. Sepsis: present on admission - His bcx are negative, C dff is negative.  - Appreciate ID input  - continue Zosyn, stop vanco - still hypotensive, low grade fever  2. Mesenteric ischemia: Symptoms not classically consistent with this diagnosis however the patient does have a clot initiated on CT angiogram of the abdomen for which continue heparin. He is not in much pain.  3. Ulcerative colitis: The patient had been in remission for the last 10 years after receiving Remicade. He has been reluctant to receive another treatment  with his more recent flares due to concerns regarding malignancy associated with immunotherapy.  - consider steroids if ok with GI (ID is ok with same)  4. Coronary artery disease: Stable. Continue aspirin 5. Hypertension: Controlled; continue carvedilol and ACE inhibitor 6. Hyperlipidemia: Continue statin therapy 7. Hypothyroidism: Continue Synthroid.  8. DVT prophylaxis: Therapeutic anticoagulation as above 9. GI prophylaxis: Pantoprazole per home regimen  - if worsens clinically, consider transfer ICU-Stepdown as patient refusing transfer for now and prefers to stay here   All the records are reviewed and case discussed with Care Management/Social Worker. Management plans discussed with the patient, Dr Vicente Males, Nursing and they are in agreement.  CODE STATUS: FULL CODE  TOTAL TIME TAKING CARE OF THIS PATIENT: 35 minutes.   More than 50% of the time was spent in counseling/coordination of care: YES  POSSIBLE D/C IN 2-3 DAYS, DEPENDING ON CLINICAL CONDITION.   Max Sane M.D on 10/27/2016 at 7:28 PM  Between 7am to 6pm - Pager - [phone]  After 6pm go to [email or web address] - Proofreader  [facility]  Office  [phone]  CC: Primary care physician; Madelyn Brunner, MD  Note: This dictation was prepared with Dragon dictation along with smaller phrase technology. Any transcriptional errors that result from this process are unintentional.

## 2016-10-27 NOTE — ED Provider Notes (Signed)
Signout from Dr. Archie Balboa in this 71 year old septic patient with ulcerative colitis. The patient has received the sepsis bundle with broad-spectrum antibiotics. He is currently awaiting CAT scan of the abdomen and pelvis. The thought is that the patient may have a comp location of ulcerative colitis such as an abscess.  Physical Exam  BP (!) 98/59   Pulse (!) 111   Temp (!) 101.8 F (38.8 C) (Oral) Comment: MD notified.  Orders placed.  Resp (!) 26   Ht 6' (1.829 m)   Wt 175 lb (79.4 kg)   SpO2 94%   BMI 23.73 kg/m   Physical Exam  ED Course  Procedures  MDM ----------------------------------------- 1115 PM on 10/26/2016 ----------------------------------------- Patient still pending CAT scan at this time. Plan is to admit to the hospital status post scan results. Signed out to Dr. Owens Shark.        Orbie Pyo, MD 10/27/16 [phone]

## 2016-10-27 NOTE — Consult Note (Signed)
[name] MD  [address] [place],  [address] Phone: [phone] Fax : [phone]  Consultation  Referring Provider:     No ref. provider found Primary Care Physician:  Madelyn Brunner, MD Primary Gastroenterologist:  Dr. Vicente Males         Reason for Consultation:     Ulcerative colitis   Date of Admission:  10/26/2016 Date of Consultation:  10/27/2016         HPI:   David [name] is a 71 y.o. male who is known to me in the outpatient.   Summary of history: Hehas had ulcerative colitis from 2007 . Tried cyclosporine in 09/2006 ,recalls was in ICU at 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 Remicaid d/c in 2009 due to the malignant skin lesions. Since 2009 Not been on any medications. He says that since then has had a few courses of prednisone. Since 2009 being followed by [facility] till 2015 when his doctor retired. Hospitalized in 2011 for flare of the colitis. His symptoms returned in early December . I performed a sigmoidoscopy on 09/25/16 and I noted moderate to severe left sided colitis. Biopsies Confirmed marked active colitis negative for HSV and CMV.Commenced him on mesalamine which he did not tolerate and was admitted to the hospital with symptoms. He was treated with oral steroids with resolution of rectal bleeding and discharged on 10/02/16. C diff negative 09/28/16  abdomen confirmed on 09/28/16 diffuse colonic wall thickening from proximal transverse colon to rectum.He was seen at my office on 10/06/16 and he appeared dehydrated, having severe symptoms from his colitis and was then admitted . During admission - stools tests negative for infection, commenced on IV hydrocortisone with rapid improvement . Discharged on oral prednisone.  I did see him in my office on 10/13/16 and he was improving. His TB quantiferon test returned as indeterminant . My plan  was for an urgent referral to [facility] for specialised IBD care and an appointment was awaited.   Labs 09/2016 - TB quantiferon indeterminant. HbsAg,Hep B c Ab,HIV,HCV ab  -negative. CRP 2.6 10/06/16 .  Last night he was back in the ER with chills and fevers of 103.2 , last few days had some diarrhea , no blood in stool .prior to that says not much diarrhea. He was on prednisone 40 mg till day before and was planning to decrease the dose .  A CT scan of the abdomen performed in the ER showed colitis of the transverse colon extending to the rectum despite being on steroids.In addition he was found to have an acute thrombosis of the proximal inferior mesenteric vein. Noted to have an elevated lactic acid on admission. Chest x ray is normal . Blood cultures have been sent and urine analysis shows no evidence of infection    Presently: Has non bloody diarrhea, no abdominal pain     Past Medical History:  Diagnosis Date  . Anemia   . CAD (coronary artery disease) 07/08/2011   Overview:  a. 07/2006: Anterior ST elevation MI. b. 07/2006: PCI with BMS to LAD and RCA. c. 09/2006: Non ST elevation. d. LVEF 30%.  Discussed ICD, not currently interested.   . 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 Dyslipidemia 07/30/2011   Overview:  High triglycerides  . GERD (gastroesophageal reflux disease) 07/08/2011  . History of  Clostridium difficile colitis   . History of myocardial infarction   . Hyperlipidemia, unspecified 07/08/2011  . Hypothyroidism   . 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 Type II diabetes mellitus, uncontrolled ([place]) 07/08/2011  . Vitamin D deficiency     Past Surgical History:  Procedure Laterality Date  . CARDIAC CATHETERIZATION    . CHOLECYSTECTOMY    . COLONOSCOPY  07/20/2006   Crohn's disease  . CORONARY STENT PLACEMENT    . FLEXIBLE SIGMOIDOSCOPY N/A 09/25/2016   Procedure: FLEXIBLE SIGMOIDOSCOPY;  Surgeon: [name], MD;   Location: [facility] ENDOSCOPY;  Service: Endoscopy;  Laterality: N/A;  . MELANOMA REMOVED    . parotid gland removal      Prior to Admission medications   Medication Sig Start Date End Date Taking? Authorizing Provider  ACCU-CHEK AVIVA PLUS test strip 1 each by Other route as needed for other.  09/01/16  Yes Historical Provider, MD  aspirin EC 81 MG tablet Take 81 mg by mouth 2 (two) times daily.    Yes Historical Provider, MD  atorvastatin (LIPITOR) 40 MG tablet Take 40 mg by mouth daily.  05/21/16  Yes Historical Provider, MD  benzonatate (TESSALON) 100 MG capsule Take 100 mg by mouth 3 (three) times daily as needed for cough. 10/26/16 11/05/16 Yes Historical Provider, MD  carvedilol (COREG) 3.125 MG tablet Take 3.125 mg by mouth 2 (two) times daily with a meal.  05/21/16  Yes Historical Provider, MD  cefUROXime (CEFTIN) 250 MG tablet Take 250 mg by mouth 2 (two) times daily. 10/26/16 11/05/16 Yes Historical Provider, MD  Cholecalciferol (VITAMIN D) 2000 units CAPS Take 1 capsule by mouth daily.  08/01/09  Yes Historical Provider, MD  glipiZIDE (GLUCOTROL) 10 MG tablet Take 10 mg by mouth 2 (two) times daily.  05/21/16  Yes Historical Provider, MD  levothyroxine (SYNTHROID, LEVOTHROID) 50 MCG tablet Take 50 mcg by mouth daily before breakfast.  05/21/16  Yes Historical Provider, MD  Multiple Vitamins-Minerals (MULTIVITAMIN ADULT PO) Take 1 tablet by mouth daily.  06/25/08  Yes Historical Provider, MD  Omega-3 Fatty Acids (FISH OIL PO) Take 1 capsule by mouth 2 (two) times daily.  06/25/08  Yes Historical Provider, MD  omeprazole (PRILOSEC) 20 MG capsule Take 20 mg by mouth daily.  05/21/16  Yes Historical Provider, MD  ramipril (ALTACE) 5 MG capsule Take 5 mg by mouth daily.  05/21/16  Yes Historical Provider, MD    Family History  Problem Relation Age of Onset  . Heart disease Mother   . Heart attack Father   . Heart disease Sister   . Heart disease Brother      Social History  Substance Use Topics    . Smoking status: Never Smoker  . Smokeless tobacco: Never Used  . Alcohol use No    Allergies as of 10/26/2016 - Review Complete 10/26/2016  Allergen Reaction Noted  . Codeine Nausea And Vomiting and Other (See Comments)     Review of Systems:    All systems reviewed and negative except where noted in HPI.   Physical Exam:  Vital signs in last 24 hours: Temp:  [97.7 F (36.5 C)-103.2 F (39.6 C)] 100.5 F (38.1 C) (01/16 1027) Pulse Rate:  [59-138] 59 (01/16 0750) Resp:  [16-26] 20 (01/16 0628) BP: (90-119)/(41-67) 111/48 (01/16 0750) SpO2:  [92 %-99 %] 94 % (01/16 0750) Weight:  [175 lb (79.4 kg)-178 lb (80.7 kg)] 178 lb (80.7 kg) (01/16 0359)   General:  Pleasant, cooperative in NAD Head:  Normocephalic and atraumatic. Eyes:   No icterus.   Conjunctiva pink. PERRLA. Ears:  Normal auditory acuity. Neck:  Supple; no masses or thyroidomegaly Lungs: Respirations even and unlabored. Lungs clear to auscultation bilaterally.   No wheezes, crackles, or rhonchi.  Heart:  Regular rate and rhythm;  Without murmur, clicks, rubs or gallops Abdomen:  Soft, nondistended, nontender. Normal bowel sounds. No appreciable masses or hepatomegaly.  No rebound or guarding.  Rectal:  Not performed. Neurologic:  Alert and oriented x3;  grossly normal neurologically. Skin:  Intact without significant lesions or rashes. Cervical Nodes:  No significant cervical adenopathy. Psych:  Alert and cooperative. Normal affect.  LAB RESULTS:  Recent Labs  10/26/16 1811  WBC 11.9*  HGB 10.9*  HCT 32.0*  PLT 266   BMET  Recent Labs  10/26/16 1811  NA 132*  K 3.8  CL 98*  CO2 24  GLUCOSE 429*  BUN 16  CREATININE 1.12  CALCIUM 8.0*   LFT  Recent Labs  10/26/16 1811  PROT 5.9*  ALBUMIN 2.3*  AST 25  ALT 15*  ALKPHOS 121  BILITOT 0.9   PT/INR  Recent Labs  10/26/16 1811  LABPROT 14.4  INR 1.11    STUDIES: Dg Chest 2 View  Result Date: 10/26/2016 CLINICAL DATA:  Cough  for 2 weeks with fever today. History of ulcerative colitis. EXAM: CHEST  2 VIEW COMPARISON:  Acute abdominal series done 09/29/2016. FINDINGS: The heart size and mediastinal contours are stable. There is a probable coronary artery stent. The lungs are clear. There is no pleural effusion or pneumothorax. Probable old rib fracture laterally on the right. No acute osseous findings are seen. IMPRESSION: No active cardiopulmonary process. Electronically Signed   By: Richardean Sale M.D.   On: 10/26/2016 18:43   Ct Abdomen Pelvis W Contrast  Result Date: 10/26/2016 CLINICAL DATA:  Malaise, chills. History of ulcerative colitis and has had a flare for the past 3-4 weeks. Patient is on steroids. EXAM: CT ABDOMEN AND PELVIS WITH CONTRAST TECHNIQUE: Multidetector CT imaging of the abdomen and pelvis was performed using the standard protocol following bolus administration of intravenous contrast. CONTRAST:  163m ISOVUE-300 IOPAMIDOL (ISOVUE-300) INJECTION 61% COMPARISON:  09/28/2016 CT FINDINGS: Lower chest: Small hiatal hernia. Top normal-sized cardiac chambers with coronary arteriosclerosis. Bibasilar atelectasis. Minimal chronic right middle lobe scarring. No effusion or pneumothorax. Stable 7 mm right lower lobe lateral nodule. Hepatobiliary: Cholecystectomy with mild intrahepatic ductal dilatation which can be seen in the setting prior cholecystectomy. No choledocholithiasis. No space-occupying mass of the liver. Pancreas: Unremarkable. No pancreatic ductal dilatation or surrounding inflammatory changes. Spleen: Normal in size without focal abnormality. Adrenals/Urinary Tract: Adrenal glands are unremarkable. Kidneys are normal, without renal calculi, focal lesion, or hydronephrosis. Bladder is unremarkable. Stomach/Bowel: Stomach is moderately distended with contrast. Normal small bowel rotation. No small bowel dilatation to suggest obstruction. Normal-appearing appendix. Transmural thickening of large bowel mid  transverse colon through rectum consistent with mild colitis. Vascular/Lymphatic: Acute appearing thrombosis of the proximal inferior mesenteric vein with distention noted along its proximal half draining the sigmoid and rectosigmoid veins terminating at the left common vein, series 5 images 51 through 68. There is mesenteric fat stranding associated with this finding. No lymphadenopathy. Reproductive: Mild prostatomegaly with peripheral zone calcifications. Other: Tiny fat containing umbilical hernia. No abdominopelvic ascites. Musculoskeletal: Degenerative changes of the lumbar spine appear chronic. No acute osseous abnormality. IMPRESSION: 1. 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 2.  Diffuse transmural thickening of proximal transverse colon through rectum consistent with colitis. 3. Otherwise chronic stable appearing changes as above. Electronically Signed   By: Ashley Royalty M.D.   On: 10/26/2016 23:15      Impression / Plan:   David [name] is a 71 y.o. y/o male with  a history of long standing ulcerative colitis- off treatment for many years and recent onset of symptoms while off all therapy for a few years. Due to prior history of skin cancer options of treatment are more limited due to increased risk of skin cancers associated with Anti TNF and relatively less data available on the newer agents, options such as Imuran too increase risk of non melanomatous skin cancer.  Tb quantiferon is indeterminate which would need to be evaluated if he has any latent Tb with an ID referral prior to any initiation of a biologic agent .3rd hospital admission over the past few weeks.He definetly needs stepping up of treatment . I had explained to him in my clinic last week that due to the complex nature of his situation , I had referred him  to [facility] to a specialized IBD center as they would have more experience treating individuals with severe colitis and prior history of skin cancer who require  biologic agents. I had also explained that there is a possibility of requiring surgery in terms of a colectomy if he fails treatment or worsens in the interim.    Presently due to concerns of sepsis - steroid use would be a contraindication and we do not have much options(had severe reaction to mesalamine) . The fact that he has colitis despite use of steroids at high doses is a negative prognostic factor . It is very possible that he may need surgery. He may be better served at [facility] due to his complex nature of issues. Biological agents at this time is not an option due to concern of sepsis, indeterminate TB quantiferon test, presently on antibiotics. Active colitis is a risk factor for venous clots  I would also suggest checking for C diff and GI stool PCR testing   I have discussed with the patient , his wife regarding transfer to [facility] or [facility] , they agree, Discussed plan with Dr Manuella Ghazi.   Thank you for involving me in the care of this patient.      LOS: 0 days   [name], MD  10/27/2016, 10:31 AM

## 2016-10-27 NOTE — Progress Notes (Signed)
ANTICOAGULATION CONSULT NOTE - Initial Consult  Pharmacy Consult for heparin Indication: thrombosis of proximal inferior mesenteric vein  Allergies  Allergen Reactions  . Codeine Nausea And Vomiting and Other (See Comments)    Patient Measurements: Height: 6' (182.9 cm) Weight: 178 lb (80.7 kg) IBW/kg (Calculated) : 77.6 Heparin Dosing Weight: 79.4 kg  Vital Signs: Temp: 100.5 F (38.1 C) (01/16 1027) Temp Source: Oral (01/16 1027) BP: 111/48 (01/16 0750) Pulse Rate: 59 (01/16 0750)  Labs:  Recent Labs  10/26/16 1811 10/27/16 0143 10/27/16 1033  HGB 10.9*  --  10.0*  HCT 32.0*  --  28.3*  PLT 266  --  164  APTT  --  36  --   LABPROT 14.4  --   --   INR 1.11  --   --   HEPARINUNFRC  --   --  0.69  CREATININE 1.12  --   --     Estimated Creatinine Clearance: 67.4 mL/min (by C-G formula based on SCr of 1.12 mg/dL).   Medical History: Past Medical History:  Diagnosis Date  . Anemia   . CAD (coronary artery disease) 07/08/2011   Overview:  a. 07/2006: Anterior ST elevation MI. b. 07/2006: PCI with BMS to LAD and RCA. c. 09/2006: Non ST elevation. d. LVEF 30%.  Discussed ICD, not currently interested.   . 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 Dyslipidemia 07/30/2011   Overview:  High triglycerides  . GERD (gastroesophageal reflux disease) 07/08/2011  . History of Clostridium difficile colitis   . History of myocardial infarction   . Hyperlipidemia, unspecified 07/08/2011  . Hypothyroidism   . 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 Type II diabetes mellitus, uncontrolled ([place]) 07/08/2011  . Vitamin D deficiency     Medications:  Infusions:  . sodium chloride 150 mL/hr at 10/27/16 1106  . heparin 1,200 Units/hr (10/27/16 0219)    Assessment: 70 yom cc fever and cough. Sepsis with UC, on abx. 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 No AC on PTA list. Some  recent bleeding related to UC, Hgb 10.9 on 1/15. Continue to follow Hgb.   Goal of Therapy:  Heparin level 0.3-0.7 units/ml Monitor platelets by anticoagulation protocol: Yes   Plan:  HL =0.69, this is therapeutic, but on the upper limits. Will decrease rate just slightly to 1150 units/hr and recheck in 8 hours. Pt is awaiting transfer to [facility] or [facility]. Will continue to monitor pt until then.  Ramond Dial, Pharm.D., BCPS Clinical Pharmacist 10/27/2016,11:22 AM

## 2016-10-27 NOTE — Consult Note (Signed)
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 male With a history of active ulcerative colitis admitted with fevers and chills as well as worsening diarrhea.  On admission he had a fever of 103.2 and a CT scan of his abdomen showed a clot in the inferior mesenteric vein as well as inflammation of the transverse colon.  He was started on heparin and has been admitted and seen by gastroenterology as well as surgery. He is immunocompromised from his ulcerative colitis.  He had a recent admission he was treated with IV hydrocortisone and oral prednisone.  He was on 40 mg most recently. For his blood cultures are negative.  He has been started on vancomycin and Zosyn.  White count on admission was 11.9 down to 10.7.  On his recent admissions in December he had negative C. difficile as well as negative GI PCR panels  Past Medical History:  Diagnosis Date  . Anemia   . CAD (coronary artery disease) 07/08/2011   Overview:  a. 07/2006: Anterior ST elevation MI. b. 07/2006: PCI with BMS to LAD and RCA. c. 09/2006: Non ST elevation. d. LVEF 30%.  Discussed ICD, not currently interested.   . 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 Dyslipidemia 07/30/2011   Overview:  High triglycerides  . GERD (gastroesophageal reflux disease) 07/08/2011  . History of Clostridium difficile colitis   . History of myocardial infarction   . Hyperlipidemia, unspecified 07/08/2011  . Hypothyroidism   . 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 Type II diabetes mellitus, uncontrolled ([place]) 07/08/2011  . Vitamin D deficiency    Past Surgical History:  Procedure Laterality Date   . CARDIAC CATHETERIZATION    . CHOLECYSTECTOMY    . COLONOSCOPY  07/20/2006   Crohn's disease  . CORONARY STENT PLACEMENT    . FLEXIBLE SIGMOIDOSCOPY N/A 09/25/2016   Procedure: FLEXIBLE SIGMOIDOSCOPY;  Surgeon: [name], MD;  Location: [facility] ENDOSCOPY;  Service: Endoscopy;  Laterality: N/A;  . MELANOMA REMOVED    . parotid gland removal     Social History  Substance Use Topics  . Smoking status: Never Smoker  . Smokeless tobacco: Never Used  . Alcohol use No   Family History  Problem Relation Age of Onset  . Heart disease Mother   . Heart attack Father   . Heart disease Sister   . Heart disease Brother     Allergies:  Allergies  Allergen Reactions  . Codeine Nausea And Vomiting and Other (See Comments)    Current antibiotics: Antibiotics Given (last 72 hours)    Date/Time Action Medication Dose Rate   10/27/16 0642 Given   vancomycin (VANCOCIN) IVPB 1000 mg/200 mL premix 1,000 mg 200 mL/hr   10/27/16 0703 Given   piperacillin-tazobactam (ZOSYN) IVPB 3.375 g 3.375 g 12.5 mL/hr   10/27/16 1514 Given   piperacillin-tazobactam (ZOSYN) IVPB 3.375 g 3.375 g 12.5 mL/hr      MEDICATIONS: . aspirin EC  81 mg Oral BID  . atorvastatin  40 mg Oral Daily  . carvedilol  3.125 mg Oral BID WC  . cholecalciferol  2,000 Units Oral Daily  . docusate sodium  100 mg Oral BID  . insulin aspart  0-15 Units Subcutaneous Q6H  . levothyroxine  50 mcg Oral QAC breakfast  . pantoprazole  40 mg Oral QAC breakfast  . piperacillin-tazobactam (ZOSYN)  IV  3.375 g Intravenous Q8H  . ramipril  5 mg Oral Daily  . sodium chloride flush  3 mL Intravenous Q12H  . vancomycin  1,000 mg Intravenous Q12H    Review of Systems - 11 systems reviewed and negative per HPI   OBJECTIVE: Temp:  [97.7 F (36.5 C)-103.2 F (39.6 C)] 99.4 F (37.4 C) (01/16 1350) Pulse Rate:  [59-138] 91 (01/16 1350) Resp:  [16-26] 20 (01/16 1350) BP: (90-119)/(41-67) 91/48 (01/16 1350) SpO2:  [92 %-99 %] 95 %  (01/16 1350) Weight:  [79.4 kg (175 lb)-80.7 kg (178 lb)] 80.7 kg (178 lb) (01/16 0359) Physical Exam  Constitutional: He is oriented to person, place, and time. He appears thin, moderately ill appearing  HENT: anicteric Mouth/Throat: Oropharynx is clear and moist. No oropharyngeal exudate.  Cardiovascular: Normal rate, regular rhythm and normal heart sounds. Exam reveals no gallop and no friction rub.  No murmur heard.  Pulmonary/Chest: Effort normal and breath sounds normal. No respiratory distress. He has no wheezes.  Abdominal: Soft. Bowel sounds are normal. He exhibits no distension. Mild diffuse ttp  Lymphadenopathy:  He has no cervical adenopathy.  Neurological: He is alert and oriented to person, place, and time.  Skin: Skin is warm and dry. No rash noted. No erythema.  Psychiatric: He has a normal mood and affect. His behavior is normal.     LABS: Results for orders placed or performed during the hospital encounter of 10/26/16 (from the past 48 hour(s))  Comprehensive metabolic panel     Status: Abnormal   Collection Time: 10/26/16  6:11 PM  Result Value Ref Range   Sodium 132 (L) 135 - 145 mmol/L   Potassium 3.8 3.5 - 5.1 mmol/L   Chloride 98 (L) 101 - 111 mmol/L   CO2 24 22 - 32 mmol/L   Glucose, Bld 429 (H) 65 - 99 mg/dL   BUN 16 6 - 20 mg/dL   Creatinine, Ser 1.12 0.61 - 1.24 mg/dL   Calcium 8.0 (L) 8.9 - 10.3 mg/dL   Total Protein 5.9 (L) 6.5 - 8.1 g/dL   Albumin 2.3 (L) 3.5 - 5.0 g/dL   AST 25 15 - 41 U/L   ALT 15 (L) 17 - 63 U/L   Alkaline Phosphatase 121 38 - 126 U/L   Total Bilirubin 0.9 0.3 - 1.2 mg/dL   GFR calc non Af Amer >60 >60 mL/min   GFR calc Af Amer >60 >60 mL/min    Comment: (NOTE) The eGFR has been calculated using the CKD EPI equation. This calculation has not been validated in all clinical situations. eGFR's persistently <60 mL/min signify possible Chronic Kidney Disease.    Anion gap 10 5 - 15  Lactic acid, plasma     Status: Abnormal    Collection Time: 10/26/16  6:11 PM  Result Value Ref Range   Lactic Acid, Venous 2.9 (HH) 0.5 - 1.9 mmol/L    Comment: CRITICAL RESULT CALLED TO, READ BACK BY AND VERIFIED WITH STEVEN JONES 10/26/16 @ 1858  MLK   CBC with Differential     Status: Abnormal   Collection Time: 10/26/16  6:11 PM  Result Value Ref Range   WBC 11.9 (H) 3.8 - 10.6 K/uL   RBC 3.58 (L) 4.40 - 5.90 MIL/uL  Hemoglobin 10.9 (L) 13.0 - 18.0 g/dL   HCT 32.0 (L) 40.0 - 52.0 %   MCV 89.2 80.0 - 100.0 fL   MCH 30.3 26.0 - 34.0 pg   MCHC 34.0 32.0 - 36.0 g/dL   RDW 14.6 (H) 11.5 - 14.5 %   Platelets 266 150 - 440 K/uL   Neutrophils Relative % 98 %   Neutro Abs 11.6 (H) 1.4 - 6.5 K/uL   Lymphocytes Relative 1 %   Lymphs Abs 0.1 (L) 1.0 - 3.6 K/uL   Monocytes Relative 1 %   Monocytes Absolute 0.2 0.2 - 1.0 K/uL   Eosinophils Relative 0 %   Eosinophils Absolute 0.0 0 - 0.7 K/uL   Basophils Relative 0 %   Basophils Absolute 0.0 0 - 0.1 K/uL  Protime-INR     Status: None   Collection Time: 10/26/16  6:11 PM  Result Value Ref Range   Prothrombin Time 14.4 11.4 - 15.2 seconds   INR 1.11   TSH     Status: None   Collection Time: 10/26/16  6:11 PM  Result Value Ref Range   TSH 2.999 0.350 - 4.500 uIU/mL    Comment: Performed by a 3rd Generation assay with a functional sensitivity of <=0.01 uIU/mL.  Culture, blood (Routine x 2)     Status: None (Preliminary result)   Collection Time: 10/26/16  6:12 PM  Result Value Ref Range   Specimen Description BLOOD RIGHT FA    Special Requests      BOTTLES DRAWN AEROBIC AND ANAEROBIC AER 16ML ANA 16ML   Culture NO GROWTH < 12 HOURS    Report Status PENDING   Culture, blood (Routine x 2)     Status: None (Preliminary result)   Collection Time: 10/26/16  6:12 PM  Result Value Ref Range   Specimen Description BLOOD LEFT FA    Special Requests      BOTTLES DRAWN AEROBIC AND ANAEROBIC AER 11ML ANA 10ML   Culture NO GROWTH < 12 HOURS    Report Status PENDING   Blood gas,  venous     Status: Abnormal   Collection Time: 10/26/16  6:18 PM  Result Value Ref Range   FIO2 0.21    pH, Ven 7.47 (H) 7.250 - 7.430   pCO2, Ven 36 (L) 44.0 - 60.0 mmHg   pO2, Ven <31.0 (LL) 32.0 - 45.0 mmHg    Comment: CRITICAL RESULT CALLED TO, READ BACK BY AND VERIFIED WITH: DR Archie Balboa AT 1830, 10/26/2016, LS    Bicarbonate 26.2 20.0 - 28.0 mmol/L   Acid-Base Excess 2.7 (H) 0.0 - 2.0 mmol/L   Patient temperature 37.0    Collection site VENOUS    Sample type VENOUS   Influenza panel by PCR (type A & B)     Status: None   Collection Time: 10/26/16  7:14 PM  Result Value Ref Range   Influenza A By PCR NEGATIVE NEGATIVE   Influenza B By PCR NEGATIVE NEGATIVE    Comment: (NOTE) The Xpert Xpress Flu assay is intended as an aid in the diagnosis of  influenza and should not be used as a sole basis for treatment.  This  assay is FDA approved for nasopharyngeal swab specimens only. Nasal  washings and aspirates are unacceptable for Xpert Xpress Flu testing.   Urinalysis, Complete w Microscopic     Status: Abnormal   Collection Time: 10/26/16  8:30 PM  Result Value Ref Range   Color, Urine YELLOW (A) YELLOW  APPearance CLEAR (A) CLEAR   Specific Gravity, Urine 1.039 (H) 1.005 - 1.030   pH 5.0 5.0 - 8.0   Glucose, UA >=500 (A) NEGATIVE mg/dL   Hgb urine dipstick NEGATIVE NEGATIVE   Bilirubin Urine NEGATIVE NEGATIVE   Ketones, ur 20 (A) NEGATIVE mg/dL   Protein, ur NEGATIVE NEGATIVE mg/dL   Nitrite NEGATIVE NEGATIVE   Leukocytes, UA NEGATIVE NEGATIVE   RBC / HPF 0-5 0 - 5 RBC/hpf   WBC, UA 0-5 0 - 5 WBC/hpf   Bacteria, UA NONE SEEN NONE SEEN   Squamous Epithelial / LPF 0-5 (A) NONE SEEN  Lactic acid, plasma     Status: Abnormal   Collection Time: 10/26/16 10:45 PM  Result Value Ref Range   Lactic Acid, Venous 5.0 (HH) 0.5 - 1.9 mmol/L    Comment: CRITICAL RESULT CALLED TO, READ BACK BY AND VERIFIED WITH BUTCH WOODS ON  10/26/16 AT 2322 BY TLB   Glucose, capillary     Status:  Abnormal   Collection Time: 10/27/16 12:39 AM  Result Value Ref Range   Glucose-Capillary 206 (H) 65 - 99 mg/dL  APTT     Status: None   Collection Time: 10/27/16  1:43 AM  Result Value Ref Range   aPTT 36 24 - 36 seconds  Glucose, capillary     Status: Abnormal   Collection Time: 10/27/16  5:20 AM  Result Value Ref Range   Glucose-Capillary 175 (H) 65 - 99 mg/dL  Lactic acid, plasma     Status: Abnormal   Collection Time: 10/27/16  7:15 AM  Result Value Ref Range   Lactic Acid, Venous 2.1 (HH) 0.5 - 1.9 mmol/L    Comment: CRITICAL RESULT CALLED TO, READ BACK BY AND VERIFIED WITH CHRYSTAL BASS ON 10/27/16 AT 0757 MNS   Heparin level (unfractionated)     Status: None   Collection Time: 10/27/16 10:33 AM  Result Value Ref Range   Heparin Unfractionated 0.69 0.30 - 0.70 IU/mL    Comment:        IF HEPARIN RESULTS ARE BELOW EXPECTED VALUES, AND PATIENT DOSAGE HAS BEEN CONFIRMED, SUGGEST FOLLOW UP TESTING OF ANTITHROMBIN III LEVELS.   CBC     Status: Abnormal   Collection Time: 10/27/16 10:33 AM  Result Value Ref Range   WBC 10.7 (H) 3.8 - 10.6 K/uL   RBC 3.16 (L) 4.40 - 5.90 MIL/uL   Hemoglobin 10.0 (L) 13.0 - 18.0 g/dL   HCT 28.3 (L) 40.0 - 52.0 %   MCV 89.3 80.0 - 100.0 fL   MCH 31.7 26.0 - 34.0 pg   MCHC 35.5 32.0 - 36.0 g/dL   RDW 14.6 (H) 11.5 - 14.5 %   Platelets 164 150 - 440 K/uL  Glucose, capillary     Status: Abnormal   Collection Time: 10/27/16 11:48 AM  Result Value Ref Range   Glucose-Capillary 159 (H) 65 - 99 mg/dL   No components found for: ESR, C REACTIVE PROTEIN MICRO: Recent Results (from the past 720 hour(s))  C difficile quick scan w PCR reflex     Status: None   Collection Time: 09/28/16  1:27 PM  Result Value Ref Range Status   C Diff antigen NEGATIVE NEGATIVE Final   C Diff toxin NEGATIVE NEGATIVE Final   C Diff interpretation No C. difficile detected.  Final  Gastrointestinal Panel by PCR , Stool     Status: None   Collection Time: 10/06/16   7:30 PM  Result Value Ref Range  Status   Campylobacter species NOT DETECTED NOT DETECTED Final   Plesimonas shigelloides NOT DETECTED NOT DETECTED Final   Salmonella species NOT DETECTED NOT DETECTED Final   Yersinia enterocolitica NOT DETECTED NOT DETECTED Final   Vibrio species NOT DETECTED NOT DETECTED Final   Vibrio cholerae NOT DETECTED NOT DETECTED Final   Enteroaggregative E coli (EAEC) NOT DETECTED NOT DETECTED Final   Enteropathogenic E coli (EPEC) NOT DETECTED NOT DETECTED Final   Enterotoxigenic E coli (ETEC) NOT DETECTED NOT DETECTED Final   Shiga like toxin producing E coli (STEC) NOT DETECTED NOT DETECTED Final   Shigella/Enteroinvasive E coli (EIEC) NOT DETECTED NOT DETECTED Final   Cryptosporidium NOT DETECTED NOT DETECTED Final   Cyclospora cayetanensis NOT DETECTED NOT DETECTED Final   Entamoeba histolytica NOT DETECTED NOT DETECTED Final   Giardia lamblia NOT DETECTED NOT DETECTED Final   Adenovirus F40/41 NOT DETECTED NOT DETECTED Final   Astrovirus NOT DETECTED NOT DETECTED Final   Norovirus GI/GII NOT DETECTED NOT DETECTED Final   Rotavirus A NOT DETECTED NOT DETECTED Final   Sapovirus (I, II, IV, and V) NOT DETECTED NOT DETECTED Final  C difficile quick scan w PCR reflex     Status: None   Collection Time: 10/06/16  7:30 PM  Result Value Ref Range Status   C Diff antigen NEGATIVE NEGATIVE Final   C Diff toxin NEGATIVE NEGATIVE Final   C Diff interpretation No C. difficile detected.  Final  Culture, blood (Routine x 2)     Status: None (Preliminary result)   Collection Time: 10/26/16  6:12 PM  Result Value Ref Range Status   Specimen Description BLOOD RIGHT FA  Final   Special Requests   Final    BOTTLES DRAWN AEROBIC AND ANAEROBIC AER 16ML ANA 16ML   Culture NO GROWTH < 12 HOURS  Final   Report Status PENDING  Incomplete  Culture, blood (Routine x 2)     Status: None (Preliminary result)   Collection Time: 10/26/16  6:12 PM  Result Value Ref Range Status    Specimen Description BLOOD LEFT FA  Final   Special Requests   Final    BOTTLES DRAWN AEROBIC AND ANAEROBIC AER 11ML ANA 10ML   Culture NO GROWTH < 12 HOURS  Final   Report Status PENDING  Incomplete    IMAGING: Dg Chest 2 View  Result Date: 10/26/2016 CLINICAL DATA:  Cough for 2 weeks with fever today. History of ulcerative colitis. EXAM: CHEST  2 VIEW COMPARISON:  Acute abdominal series done 09/29/2016. FINDINGS: The heart size and mediastinal contours are stable. There is a probable coronary artery stent. The lungs are clear. There is no pleural effusion or pneumothorax. Probable old rib fracture laterally on the right. No acute osseous findings are seen. IMPRESSION: No active cardiopulmonary process. Electronically Signed   By: Richardean Sale M.D.   On: 10/26/2016 18:43   Ct Abdomen Pelvis W Contrast  Result Date: 10/26/2016 CLINICAL DATA:  Malaise, chills. History of ulcerative colitis and has had a flare for the past 3-4 weeks. Patient is on steroids. EXAM: CT ABDOMEN AND PELVIS WITH CONTRAST TECHNIQUE: Multidetector CT imaging of the abdomen and pelvis was performed using the standard protocol following bolus administration of intravenous contrast. CONTRAST:  158m ISOVUE-300 IOPAMIDOL (ISOVUE-300) INJECTION 61% COMPARISON:  09/28/2016 CT FINDINGS: Lower chest: Small hiatal hernia. Top normal-sized cardiac chambers with coronary arteriosclerosis. Bibasilar atelectasis. Minimal chronic right middle lobe scarring. No effusion or pneumothorax. Stable 7 mm right lower  lobe lateral nodule. Hepatobiliary: Cholecystectomy with mild intrahepatic ductal dilatation which can be seen in the setting prior cholecystectomy. No choledocholithiasis. No space-occupying mass of the liver. Pancreas: Unremarkable. No pancreatic ductal dilatation or surrounding inflammatory changes. Spleen: Normal in size without focal abnormality. Adrenals/Urinary Tract: Adrenal glands are unremarkable. Kidneys are normal,  without renal calculi, focal lesion, or hydronephrosis. Bladder is unremarkable. Stomach/Bowel: Stomach is moderately distended with contrast. Normal small bowel rotation. No small bowel dilatation to suggest obstruction. Normal-appearing appendix. Transmural thickening of large bowel mid transverse colon through rectum consistent with mild colitis. Vascular/Lymphatic: Acute appearing thrombosis of the proximal inferior mesenteric vein with distention noted along its proximal half draining the sigmoid and rectosigmoid veins terminating at the left common vein, series 5 images 51 through 68. There is mesenteric fat stranding associated with this finding. No lymphadenopathy. Reproductive: Mild prostatomegaly with peripheral zone calcifications. Other: Tiny fat containing umbilical hernia. No abdominopelvic ascites. Musculoskeletal: Degenerative changes of the lumbar spine appear chronic. No acute osseous abnormality. IMPRESSION: 1. 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 2. Diffuse transmural thickening of proximal transverse colon through rectum consistent with colitis. 3. Otherwise chronic stable appearing changes as above. Electronically Signed   By: Ashley Royalty M.D.   On: 10/26/2016 23:15   Ct Abdomen Pelvis W Contrast  Result Date: 09/28/2016 CLINICAL DATA:  Left lower quadrant pain with recent bloody diarrhea, history of ulcerative colitis EXAM: CT ABDOMEN AND PELVIS WITH CONTRAST TECHNIQUE: Multidetector CT imaging of the abdomen and pelvis was performed using the standard protocol following bolus administration of intravenous contrast. CONTRAST:  142m ISOVUE-300 IOPAMIDOL (ISOVUE-300) INJECTION 61% COMPARISON:  10/07/2011 FINDINGS: Lower chest: Lung bases are well visualized. 7 mm stable nodule is noted in the right lower lobe laterally. Minimal scarring is noted in the right middle lobe stable from the prior exam. These changes are benign given their chronicity. Hepatobiliary: The  liver is diffusely decreased in attenuation consistent with fatty infiltration. The gallbladder has been surgically removed. Pancreas: Unremarkable. No pancreatic ductal dilatation or surrounding inflammatory changes. Spleen: Normal in size without focal abnormality. Adrenals/Urinary Tract: The adrenal glands are within normal limits. The kidneys demonstrate a normal enhancement pattern. No abnormal mass is identified. Delayed images demonstrate normal excretion. Bladder is well distended. Stomach/Bowel: Stomach is within normal limits. Appendix appears normal. Small bowel is within normal limits. There is diffuse wall thickening of the colon from approximately the proximal transverse colon extending distally to the rectum. Diverticular change is seen. Only mild very colonic inflammatory change is seen. Changes may be related to the patient's underlying ulcerative colitis. The possibility of an infectious etiology would deserve consideration as well. Scattered associated mesenteric lymph nodes are noted. Vascular/Lymphatic: Scattered reactive mesenteric lymph nodes. No other significant lymphadenopathy is noted. Aortic atherosclerotic change is seen without aneurysmal dilatation. Reproductive: Prostate is unremarkable. Other: No abdominal wall hernia or abnormality. No abdominopelvic ascites. Musculoskeletal: Degenerative change of the lumbar spine is noted. IMPRESSION: Diffuse colonic wall thickening from the proximal transverse colon to the rectum. Differential includes patient's known ulcerative colitis are possible infectious or ischemic etiologies. Chronic changes as described above. Electronically Signed   By: MInez CatalinaM.D.   On: 09/28/2016 16:01   Dg Abd Acute W/chest  Result Date: 09/30/2016 CLINICAL DATA:  Increasing abdominal pain today. EXAM: DG ABDOMEN ACUTE W/ 1V CHEST COMPARISON:  09/28/2016 FINDINGS: Colonic palm prints Ing is present, typical of mural edema associated with colitis. No  evidence of bowel obstruction. No extraluminal  air. The upright view of the chest is negative for acute cardiopulmonary abnormality. IMPRESSION: Findings consistent with colitis. No evidence of obstruction or perforation. Electronically Signed   By: Andreas Newport M.D.   On: 09/30/2016 00:06    Assessment:   David [name] is a 71 y.o. male with ulcerative colitis ongoing flare, admit with fevers, chills and has CT showing IMV clot and diffuse colonic wall thickening. His bcx are negative currently but he is at risk of bacteremia given the clot and inflammation leading to translocation of mainly gram neg and anaerobic pathogens. C dff is negative.  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   Recommendations Would continue zosyn pending cultures.  Can dc vancomycin as he does not have a line in place and resistant gram positives would be unlikely in this case.  If he needs immunosuppressive therapy I would not delay if due to this current infection as he will likely get worse without treatment of his UC and be at increased risk of recurrent sepsis.  I think he is at low risk of TB despite the indeterminate QFG (it was due to anergy)  as he reports prior neg PPD and has no other risk factors for acquiring TB over the last decade.  NO further testing is required  Thank you very much for allowing me to participate in the care of this patient. Please call with questions.   Cheral Marker. Ola Spurr, MD

## 2016-10-27 NOTE — ED Notes (Signed)
This RN to bedside to transport patient. Dr. Marcille Blanco at bedside at this time. Patient given 2 warm blankets, will transport after patient sees Dr. Marcille Blanco.

## 2016-10-27 NOTE — Progress Notes (Signed)
Dr. Marcille Blanco notified of fever, chills and tachycardia; tylenol given; will start antibiotics; acknowledged. Barbaraann Faster, RN 6:18 AM 10/27/2016

## 2016-10-27 NOTE — Telephone Encounter (Signed)
Please let Dr. Vicente Males know that patient was admitted to hospital per wife.

## 2016-10-27 NOTE — Consult Note (Signed)
Date of Consultation:  10/27/2016  Requesting Physician:  Marjean Donna, MD  Reason for Consultation:  Inferior mesenteric vein thrombosis  History of Present Illness: David [name] is a 71 y.o. male with history of ulcerative colitis, recently admitted last month with a flare up, and started on steroids.  He had been having bloody diarrhea with abdominal cramping, but with the steroids, his pain and diarrhea have improved and the patient reports that this past week, he has not had blood per rectum and his stool is becoming more formed.  He continues taking prednisone.  Of note, due to the bloody diarrhea last month, he stopped taking his ASA 81 mg BID.  He now reports that he has had intermittent fever and chills over the past two days.  He had seen his PCP on 1/12 and was started on an antibiotic for possible sinus infection, but due to error in the order for antibiotic, he was not able to get it filled until today and he did have one dose today.  He is unsure which antibiotic it was.  He denies having any other symptoms with the fever and chills.  He denies any dizziness or lightheadedness, chest pain or shortness of breath, nausea or vomiting, worsening abdominal pain, or blood per rectum.  In the ED, he had a CT scan of abdomen and pelvis with contrast which showed an inferior mesenteric vein thrombus with surrounding stranding of the mesentery.  He had diffuse mild wall thickening of mid transverse colon down to rectum which is the same as on his CT scan on 09/28/16.   Past Medical History: Past Medical History:  Diagnosis Date  . Anemia   . CAD (coronary artery disease) 07/08/2011   Overview:  a. 07/2006: Anterior ST elevation MI. b. 07/2006: PCI with BMS to LAD and RCA. c. 09/2006: Non ST elevation. d. LVEF 30%.  Discussed ICD, not currently interested.   . 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 Dyslipidemia 07/30/2011   Overview:  High triglycerides   . GERD (gastroesophageal reflux disease) 07/08/2011  . History of Clostridium difficile colitis   . History of myocardial infarction   . Hyperlipidemia, unspecified 07/08/2011  . Hypothyroidism   . 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 Type II diabetes mellitus, uncontrolled ([place]) 07/08/2011  . Vitamin D deficiency      Past Surgical History: Past Surgical History:  Procedure Laterality Date  . CARDIAC CATHETERIZATION    . CHOLECYSTECTOMY    . COLONOSCOPY  07/20/2006   Crohn's disease  . CORONARY STENT PLACEMENT    . FLEXIBLE SIGMOIDOSCOPY N/A 09/25/2016   Procedure: FLEXIBLE SIGMOIDOSCOPY;  Surgeon: [name], MD;  Location: [facility] ENDOSCOPY;  Service: Endoscopy;  Laterality: N/A;  . MELANOMA REMOVED    . parotid gland removal      Home Medications: Prior to Admission medications   Medication Sig Start Date End Date Taking? Authorizing Provider  ACCU-CHEK AVIVA PLUS test strip  09/01/16   Historical Provider, MD  aspirin EC 81 MG tablet Take 81 mg by mouth 2 (two) times daily.     Historical Provider, MD  atorvastatin (LIPITOR) 40 MG tablet Take 40 mg by mouth daily.  05/21/16   Historical Provider, MD  carvedilol (COREG) 3.125 MG tablet Take 3.125 mg by mouth 2 (two) times daily with a meal.  05/21/16   Historical Provider, MD  Cholecalciferol (VITAMIN D) 2000 units CAPS Take 1 capsule by mouth daily.  08/01/09  Historical Provider, MD  glipiZIDE (GLUCOTROL) 10 MG tablet Take 10 mg by mouth 2 (two) times daily.  05/21/16   Historical Provider, MD  levothyroxine (SYNTHROID, LEVOTHROID) 50 MCG tablet Take 50 mcg by mouth daily before breakfast.  05/21/16   Historical Provider, MD  Multiple Vitamins-Minerals (MULTIVITAMIN ADULT PO) Take 1 tablet by mouth daily.  06/25/08   Historical Provider, MD  Omega-3 Fatty Acids (FISH OIL PO) Take 1 capsule by mouth 2 (two) times daily.  06/25/08   Historical Provider, MD  omeprazole (PRILOSEC) 20 MG capsule Take 20 mg by  mouth daily.  05/21/16   Historical Provider, MD  predniSONE (DELTASONE) 10 MG tablet 40 mg daily for now till you see the GI doctor 10/08/16   Fritzi Mandes, MD  ramipril (ALTACE) 5 MG capsule Take 5 mg by mouth daily.  05/21/16   Historical Provider, MD    Allergies: Allergies  Allergen Reactions  . Codeine Nausea And Vomiting and Other (See Comments)    Social History:  reports that he has never smoked. He has never used smokeless tobacco. He reports that he does not drink alcohol or use drugs.   Family History: Family History  Problem Relation Age of Onset  . Heart disease Mother   . Heart attack Father   . Heart disease Sister   . Heart disease Brother     Review of Systems: Review of Systems  Constitutional: Positive for chills and fever.  HENT: Negative for hearing loss.   Eyes: Negative for blurred vision.  Respiratory: Negative for cough and shortness of breath.   Cardiovascular: Negative for chest pain and leg swelling.  Gastrointestinal: Negative for abdominal pain (No worsening pain), blood in stool, constipation, diarrhea (Improving diarrhea), heartburn, nausea and vomiting.  Genitourinary: Negative for dysuria and hematuria.  Musculoskeletal: Negative for myalgias.  Skin: Negative for rash.  Neurological: Negative for dizziness.  Psychiatric/Behavioral: Negative for depression.    Physical Exam BP (!) 104/57 (BP Location: Right Arm)   Pulse 92   Temp (!) 101.8 F (38.8 C) (Oral) Comment: MD notified.  Orders placed.  Resp 18   Ht 6' (1.829 m)   Wt 79.4 kg (175 lb)   SpO2 96%   BMI 23.73 kg/m  CONSTITUTIONAL: No acute distress, appears comfortable. HEENT:  Normocephalic, atraumatic, extraocular motion intact. NECK: Trachea is midline, and there is no jugular venous distension. RESPIRATORY:  Lungs are clear, and breath sounds are equal bilaterally. Normal respiratory effort without pathologic use of accessory muscles. CARDIOVASCULAR: Heart is regular without  murmurs, gallops, or rubs. GI: The abdomen is soft, non-distended, with mild tenderness to palpation over the left abdomen, which the patient reports is better tonight compared to earlier in the week.  There is no pain out of proportion, and no peritoneal signs. MUSCULOSKELETAL:  Normal muscle strength and tone in all four extremities.  No peripheral edema or cyanosis. SKIN: Skin turgor is normal. There are no pathologic skin lesions.  NEUROLOGIC:  Motor and sensation is grossly normal.  Cranial nerves are grossly intact. PSYCH:  Alert and oriented to person, place and time. Affect is normal.  Laboratory Analysis: Results for orders placed or performed during the hospital encounter of 10/26/16 (from the past 24 hour(s))  Comprehensive metabolic panel     Status: Abnormal   Collection Time: 10/26/16  6:11 PM  Result Value Ref Range   Sodium 132 (L) 135 - 145 mmol/L   Potassium 3.8 3.5 - 5.1 mmol/L   Chloride 98 (  L) 101 - 111 mmol/L   CO2 24 22 - 32 mmol/L   Glucose, Bld 429 (H) 65 - 99 mg/dL   BUN 16 6 - 20 mg/dL   Creatinine, Ser 1.12 0.61 - 1.24 mg/dL   Calcium 8.0 (L) 8.9 - 10.3 mg/dL   Total Protein 5.9 (L) 6.5 - 8.1 g/dL   Albumin 2.3 (L) 3.5 - 5.0 g/dL   AST 25 15 - 41 U/L   ALT 15 (L) 17 - 63 U/L   Alkaline Phosphatase 121 38 - 126 U/L   Total Bilirubin 0.9 0.3 - 1.2 mg/dL   GFR calc non Af Amer >60 >60 mL/min   GFR calc Af Amer >60 >60 mL/min   Anion gap 10 5 - 15  Lactic acid, plasma     Status: Abnormal   Collection Time: 10/26/16  6:11 PM  Result Value Ref Range   Lactic Acid, Venous 2.9 (HH) 0.5 - 1.9 mmol/L  CBC with Differential     Status: Abnormal   Collection Time: 10/26/16  6:11 PM  Result Value Ref Range   WBC 11.9 (H) 3.8 - 10.6 K/uL   RBC 3.58 (L) 4.40 - 5.90 MIL/uL   Hemoglobin 10.9 (L) 13.0 - 18.0 g/dL   HCT 32.0 (L) 40.0 - 52.0 %   MCV 89.2 80.0 - 100.0 fL   MCH 30.3 26.0 - 34.0 pg   MCHC 34.0 32.0 - 36.0 g/dL   RDW 14.6 (H) 11.5 - 14.5 %   Platelets  266 150 - 440 K/uL   Neutrophils Relative % 98 %   Neutro Abs 11.6 (H) 1.4 - 6.5 K/uL   Lymphocytes Relative 1 %   Lymphs Abs 0.1 (L) 1.0 - 3.6 K/uL   Monocytes Relative 1 %   Monocytes Absolute 0.2 0.2 - 1.0 K/uL   Eosinophils Relative 0 %   Eosinophils Absolute 0.0 0 - 0.7 K/uL   Basophils Relative 0 %   Basophils Absolute 0.0 0 - 0.1 K/uL  Protime-INR     Status: None   Collection Time: 10/26/16  6:11 PM  Result Value Ref Range   Prothrombin Time 14.4 11.4 - 15.2 seconds   INR 1.11   Blood gas, venous     Status: Abnormal (Preliminary result)   Collection Time: 10/26/16  6:18 PM  Result Value Ref Range   FIO2 0.21    pH, Ven 7.47 (H) 7.250 - 7.430   pCO2, Ven 36 (L) 44.0 - 60.0 mmHg   pO2, Ven <31.0 (LL) 32.0 - 45.0 mmHg   Bicarbonate 26.2 20.0 - 28.0 mmol/L   Acid-Base Excess 2.7 (H) 0.0 - 2.0 mmol/L   O2 Saturation PENDING %   Patient temperature 37.0    Collection site VENOUS    Sample type VENOUS   Influenza panel by PCR (type A & B)     Status: None   Collection Time: 10/26/16  7:14 PM  Result Value Ref Range   Influenza A By PCR NEGATIVE NEGATIVE   Influenza B By PCR NEGATIVE NEGATIVE  Urinalysis, Complete w Microscopic     Status: Abnormal   Collection Time: 10/26/16  8:30 PM  Result Value Ref Range   Color, Urine YELLOW (A) YELLOW   APPearance CLEAR (A) CLEAR   Specific Gravity, Urine 1.039 (H) 1.005 - 1.030   pH 5.0 5.0 - 8.0   Glucose, UA >=500 (A) NEGATIVE mg/dL   Hgb urine dipstick NEGATIVE NEGATIVE   Bilirubin Urine NEGATIVE NEGATIVE  Ketones, ur 20 (A) NEGATIVE mg/dL   Protein, ur NEGATIVE NEGATIVE mg/dL   Nitrite NEGATIVE NEGATIVE   Leukocytes, UA NEGATIVE NEGATIVE   RBC / HPF 0-5 0 - 5 RBC/hpf   WBC, UA 0-5 0 - 5 WBC/hpf   Bacteria, UA NONE SEEN NONE SEEN   Squamous Epithelial / LPF 0-5 (A) NONE SEEN  Lactic acid, plasma     Status: Abnormal   Collection Time: 10/26/16 10:45 PM  Result Value Ref Range   Lactic Acid, Venous 5.0 (HH) 0.5 - 1.9  mmol/L  Glucose, capillary     Status: Abnormal   Collection Time: 10/27/16 12:39 AM  Result Value Ref Range   Glucose-Capillary 206 (H) 65 - 99 mg/dL    Imaging: Dg Chest 2 View  Result Date: 10/26/2016 CLINICAL DATA:  Cough for 2 weeks with fever today. History of ulcerative colitis. EXAM: CHEST  2 VIEW COMPARISON:  Acute abdominal series done 09/29/2016. FINDINGS: The heart size and mediastinal contours are stable. There is a probable coronary artery stent. The lungs are clear. There is no pleural effusion or pneumothorax. Probable old rib fracture laterally on the right. No acute osseous findings are seen. IMPRESSION: No active cardiopulmonary process. Electronically Signed   By: Richardean Sale M.D.   On: 10/26/2016 18:43   Ct Abdomen Pelvis W Contrast  Result Date: 10/26/2016 CLINICAL DATA:  Malaise, chills. History of ulcerative colitis and has had a flare for the past 3-4 weeks. Patient is on steroids. EXAM: CT ABDOMEN AND PELVIS WITH CONTRAST TECHNIQUE: Multidetector CT imaging of the abdomen and pelvis was performed using the standard protocol following bolus administration of intravenous contrast. CONTRAST:  152m ISOVUE-300 IOPAMIDOL (ISOVUE-300) INJECTION 61% COMPARISON:  09/28/2016 CT FINDINGS: Lower chest: Small hiatal hernia. Top normal-sized cardiac chambers with coronary arteriosclerosis. Bibasilar atelectasis. Minimal chronic right middle lobe scarring. No effusion or pneumothorax. Stable 7 mm right lower lobe lateral nodule. Hepatobiliary: Cholecystectomy with mild intrahepatic ductal dilatation which can be seen in the setting prior cholecystectomy. No choledocholithiasis. No space-occupying mass of the liver. Pancreas: Unremarkable. No pancreatic ductal dilatation or surrounding inflammatory changes. Spleen: Normal in size without focal abnormality. Adrenals/Urinary Tract: Adrenal glands are unremarkable. Kidneys are normal, without renal calculi, focal lesion, or hydronephrosis.  Bladder is unremarkable. Stomach/Bowel: Stomach is moderately distended with contrast. Normal small bowel rotation. No small bowel dilatation to suggest obstruction. Normal-appearing appendix. Transmural thickening of large bowel mid transverse colon through rectum consistent with mild colitis. Vascular/Lymphatic: Acute appearing thrombosis of the proximal inferior mesenteric vein with distention noted along its proximal half draining the sigmoid and rectosigmoid veins terminating at the left common vein, series 5 images 51 through 68. There is mesenteric fat stranding associated with this finding. No lymphadenopathy. Reproductive: Mild prostatomegaly with peripheral zone calcifications. Other: Tiny fat containing umbilical hernia. No abdominopelvic ascites. Musculoskeletal: Degenerative changes of the lumbar spine appear chronic. No acute osseous abnormality. IMPRESSION: 1. 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 2. Diffuse transmural thickening of proximal transverse colon through rectum consistent with colitis. 3. Otherwise chronic stable appearing changes as above. Electronically Signed   By: DAshley RoyaltyM.D.   On: 10/26/2016 23:15    Assessment and Plan: This is a 71y.o. male who presents with a history of ulcerative colitis and recent flare-up, now with thrombosis of the inferior mesenteric vein.  I have personally reviewed the patient's laboratory and imaging studies.    --On physical exam, the patient is not in distress and  he is not peritoneal and reports that his tenderness over the left abdomen is actually better than it had been over past few days.  He has not had any blood bowel movements recently.  In light of these findings, there is no urgent need for laparotomy and can treat his IMV thrombosis non-operatively for now. --Start IV Heparin gtt for anticoagulation. --Continue prophylactic broad spectrum antibiotics to limit any potential bacterial translocation and to treat  his sepsis. --Patient should be NPO with IV fluid hydration and resuscitation. --Please consult GI for further assistance with management of his ulcerative colitis. --Had a lengthy discussion with the patient explaining what IMV thrombosis means and entails and what the process is with conservative management.  He does understand that if his symptoms worsen, that he may require an operation and that this may require bowel resection and possible second look surgery to evaluate for any further ischemia.   --Patient understands this plan and all of his questions have been answered.   Melvyn Neth, [place]

## 2016-10-27 NOTE — Progress Notes (Signed)
Dr. Hampton Abbot updated on patient's condition; new orders written; acknowledged. Barbaraann Faster, RN 6:53 AM 10/27/2016

## 2016-10-28 ENCOUNTER — Ambulatory Visit: Payer: Self-pay | Admitting: Gastroenterology

## 2016-10-28 DIAGNOSIS — K5100 Ulcerative (chronic) pancolitis without complications: Secondary | ICD-10-CM

## 2016-10-28 LAB — BASIC METABOLIC PANEL
Anion gap: 5 (ref 5–15)
BUN: 12 mg/dL (ref 6–20)
CALCIUM: 6.7 mg/dL — AB (ref 8.9–10.3)
CO2: 22 mmol/L (ref 22–32)
Chloride: 110 mmol/L (ref 101–111)
Creatinine, Ser: 0.88 mg/dL (ref 0.61–1.24)
GFR calc Af Amer: >60 mL/min (ref >60)
GFR calc non Af Amer: >60 mL/min (ref >60)
GLUCOSE: 137 mg/dL — AB (ref 65–99)
Potassium: 2.6 mmol/L — CL (ref 3.5–5.1)
Sodium: 137 mmol/L (ref 135–145)

## 2016-10-28 LAB — BLOOD CULTURE ID PANEL (REFLEXED)
Acinetobacter baumannii: NOT DETECTED
CANDIDA GLABRATA: NOT DETECTED
CANDIDA PARAPSILOSIS: NOT DETECTED
CANDIDA TROPICALIS: NOT DETECTED
Candida albicans: NOT DETECTED
Candida krusei: NOT DETECTED
Carbapenem resistance: NOT DETECTED
Enterobacter cloacae complex: NOT DETECTED
Enterobacteriaceae species: DETECTED — AB
Enterococcus species: NOT DETECTED
Escherichia coli: DETECTED — AB
HAEMOPHILUS INFLUENZAE: NOT DETECTED
Klebsiella oxytoca: NOT DETECTED
Klebsiella pneumoniae: NOT DETECTED
Listeria monocytogenes: NOT DETECTED
NEISSERIA MENINGITIDIS: NOT DETECTED
Proteus species: NOT DETECTED
Pseudomonas aeruginosa: NOT DETECTED
SERRATIA MARCESCENS: NOT DETECTED
STAPHYLOCOCCUS SPECIES: NOT DETECTED
STREPTOCOCCUS AGALACTIAE: NOT DETECTED
Staphylococcus aureus (BCID): NOT DETECTED
Streptococcus pneumoniae: NOT DETECTED
Streptococcus pyogenes: NOT DETECTED
Streptococcus species: NOT DETECTED

## 2016-10-28 LAB — CBC
HCT: 26.4 % — ABNORMAL LOW (ref 40.0–52.0)
Hemoglobin: 9.1 g/dL — ABNORMAL LOW (ref 13.0–18.0)
MCH: 31.4 pg (ref 26.0–34.0)
MCHC: 34.6 g/dL (ref 32.0–36.0)
MCV: 90.7 fL (ref 80.0–100.0)
PLATELETS: 166 10*3/uL (ref 150–440)
RBC: 2.91 MIL/uL — ABNORMAL LOW (ref 4.40–5.90)
RDW: 15.1 % — AB (ref 11.5–14.5)
WBC: 9 10*3/uL (ref 3.8–10.6)

## 2016-10-28 LAB — URINE CULTURE: Culture: <10000 — AB

## 2016-10-28 LAB — HEPARIN LEVEL (UNFRACTIONATED)
HEPARIN UNFRACTIONATED: 0.53 [IU]/mL (ref 0.30–0.70)
HEPARIN UNFRACTIONATED: 0.38 [IU]/mL (ref 0.30–0.70)

## 2016-10-28 LAB — MAGNESIUM: MAGNESIUM: 1.6 mg/dL — AB (ref 1.7–2.4)

## 2016-10-28 LAB — GLUCOSE, CAPILLARY
Glucose-Capillary: 102 mg/dL — ABNORMAL HIGH (ref 65–99)
Glucose-Capillary: 118 mg/dL — ABNORMAL HIGH (ref 65–99)
Glucose-Capillary: 121 mg/dL — ABNORMAL HIGH (ref 65–99)
Glucose-Capillary: 139 mg/dL — ABNORMAL HIGH (ref 65–99)

## 2016-10-28 MED ORDER — POTASSIUM CHLORIDE CRYS ER 20 MEQ PO TBCR
40.0000 meq | EXTENDED_RELEASE_TABLET | Freq: Once | ORAL | Status: AC
  Administered 2016-10-28: 40 meq via ORAL
  Filled 2016-10-28: qty 2

## 2016-10-28 MED ORDER — DEXTROSE-NACL 5-0.45 % IV SOLN
INTRAVENOUS | Status: AC
  Administered 2016-10-28: 02:00:00 via INTRAVENOUS

## 2016-10-28 NOTE — Progress Notes (Signed)
Initial Nutrition Assessment  DOCUMENTATION CODES:   Not applicable  INTERVENTION:   Ensure Enlive po BID, each supplement provides 350 kcal and 20 grams of protein  NUTRITION DIAGNOSIS:   Inadequate oral intake related to acute illness as evidenced by moderate depletions of muscle mass, NPO status.  GOAL:   Patient will meet greater than or equal to 90% of their needs  MONITOR:   PO intake, Supplement acceptance, I & O's  REASON FOR ASSESSMENT:   Malnutrition Screening Tool    ASSESSMENT:   71 y/o male complaining of fever and cough. Sepsis with ulcerative colitis, on abx. CT noted acute thrombosis of proximal inferior mesenteric vein    Met with pt in room today. Pt has h/o ulcerative colitis that has been stable for 30yr up until about a month ago. Pt reports explosive diarrhea for 1 month. Pt reports that he has still been eating normally. Pt is upset that he is currently NPO and states that he is starving. Per chart, pt has lost 10lbs(5%) in 1 month. This is significant. Pt likes Ensure. Will order supplements for moderate protein depletions. Spoke to MD, plan is to order clear liquid diet after GI and SX consult.   Medications reviewed and include: aspirin, vit D, colace, synthroid, insulin, meropenem, protonix  Labs reviewed: Na 132(L), CL 98(L), Ca 8.0(L) adj. 9.36 wnl, Alb 2.3(L), lactic acid 2.1(H) Wbc- 10.7(H), Hgb 10(L), Hct 28.3(L) BG- 429-1/15  Nutrition-Focused physical exam completed. Findings are no fat depletion, moderate muscle depletion, and mild edema.   Diet Order:  Diet NPO time specified Except for: Sips with Meds  Skin:  Reviewed, no issues  Last BM:  1/16- diarrhea   Height:   Ht Readings from Last 1 Encounters:  10/26/16 6' (1.829 m)    Weight:   Wt Readings from Last 1 Encounters:  10/27/16 178 lb (80.7 kg)    Ideal Body Weight:  80.9 kg  BMI:  Body mass index is 24.14 kg/m.  Estimated Nutritional Needs:   Kcal:   2000-2400kcal/day   Protein:  89-105g/day   Fluid:  >2L/day   EDUCATION NEEDS:   Education needs addressed  CKoleen Distance RD, LDN Pager #- [phone]

## 2016-10-28 NOTE — Progress Notes (Signed)
David Bellows MD [address], [place], [place] [address] Phone: [phone] Fax : [phone]  David [name] is being followed for ulcerative colitis  Day 2 of follow up   Subjective: He feels well , some non bloody diarrhea. No abdominal pain , he is hungry   Objective: Vital signs in last 24 hours: Vitals:   10/27/16 1708 10/27/16 2331 10/28/16 0531 10/28/16 0911  BP: (!) 98/58 (!) 106/52 (!) 112/53 (!) 115/59  Pulse: 85 80 81 76  Resp:  17 18 18   Temp:  98.3 F (36.8 C) 98.5 F (36.9 C) 97.9 F (36.6 C)  TempSrc:    Oral  SpO2:  97% 97% 91%  Weight:      Height:       Weight change:   Intake/Output Summary (Last 24 hours) at 10/28/16 1527 Last data filed at 10/28/16 1408  Gross per 24 hour  Intake             2531 ml  Output              150 ml  Net             2381 ml     Exam: Heart:: Regular rate and rhythm, S1S2 present or without murmur or extra heart sounds Lungs: normal and clear to auscultation Abdomen: soft, nontender, normal bowel sounds   Lab Results: @LABTEST2 @ Micro Results: Recent Results (from the past 240 hour(s))  Culture, blood (Routine x 2)     Status: None (Preliminary result)   Collection Time: 10/26/16  6:12 PM  Result Value Ref Range Status   Specimen Description BLOOD RIGHT FA  Final   Special Requests   Final    BOTTLES DRAWN AEROBIC AND ANAEROBIC AER 16ML ANA 16ML   Culture  Setup Time   Final    Organism ID to follow GRAM NEGATIVE RODS ANAEROBIC BOTTLE ONLY CRITICAL RESULT CALLED TO, READ BACK BY AND VERIFIED WITH: [name] HALLAJI 10/27/16 @ 73 Melissa    Culture GRAM NEGATIVE RODS  Final   Report Status PENDING  Incomplete  Culture, blood (Routine x 2)     Status: None (Preliminary result)   Collection Time: 10/26/16  6:12 PM  Result Value Ref Range Status   Specimen Description BLOOD LEFT FA  Final   Special Requests   Final    BOTTLES DRAWN AEROBIC AND ANAEROBIC AER 11ML ANA 10ML   Culture NO GROWTH 2  DAYS  Final   Report Status PENDING  Incomplete  Blood Culture ID Panel (Reflexed)     Status: Abnormal   Collection Time: 10/26/16  6:12 PM  Result Value Ref Range Status   Enterococcus species NOT DETECTED NOT DETECTED Final   Listeria monocytogenes NOT DETECTED NOT DETECTED Final   Staphylococcus species NOT DETECTED NOT DETECTED Final   Staphylococcus aureus NOT DETECTED NOT DETECTED Final   Streptococcus species NOT DETECTED NOT DETECTED Final   Streptococcus agalactiae NOT DETECTED NOT DETECTED Final   Streptococcus pneumoniae NOT DETECTED NOT DETECTED Final   Streptococcus pyogenes NOT DETECTED NOT DETECTED Final   Acinetobacter baumannii NOT DETECTED NOT DETECTED Final   Enterobacteriaceae species DETECTED (A) NOT DETECTED Final    Comment: CRITICAL RESULT CALLED TO, READ BACK BY AND VERIFIED WITH: [name] HALLAJI 10/27/16 2220 [place]    Enterobacter cloacae complex NOT DETECTED NOT DETECTED Final   Escherichia coli DETECTED (A) NOT DETECTED Final    Comment: CRITICAL RESULT CALLED TO, READ BACK BY AND  VERIFIED WITH: [name] HALLAJI 10/27/16 2220 [place]    Klebsiella oxytoca NOT DETECTED NOT DETECTED Final   Klebsiella pneumoniae NOT DETECTED NOT DETECTED Final   Proteus species NOT DETECTED NOT DETECTED Final   Serratia marcescens NOT DETECTED NOT DETECTED Final   Carbapenem resistance NOT DETECTED NOT DETECTED Final   Haemophilus influenzae NOT DETECTED NOT DETECTED Final   Neisseria meningitidis NOT DETECTED NOT DETECTED Final   Pseudomonas aeruginosa NOT DETECTED NOT DETECTED Final   Candida albicans NOT DETECTED NOT DETECTED Final   Candida glabrata NOT DETECTED NOT DETECTED Final   Candida krusei NOT DETECTED NOT DETECTED Final   Candida parapsilosis NOT DETECTED NOT DETECTED Final   Candida tropicalis NOT DETECTED NOT DETECTED Final  Urine culture     Status: Abnormal   Collection Time: 10/26/16  8:30 PM  Result Value Ref Range Status   Specimen Description URINE, RANDOM   Final   Special Requests NONE  Final   Culture (A)  Final    <10,000 COLONIES/mL INSIGNIFICANT GROWTH Performed at [facility], [place] [address] [place], [place] [address]    Report Status 10/28/2016 FINAL  Final  C difficile quick scan w PCR reflex     Status: None   Collection Time: 10/27/16  4:58 PM  Result Value Ref Range Status   C Diff antigen NEGATIVE NEGATIVE Final   C Diff toxin NEGATIVE NEGATIVE Final   C Diff interpretation No C. difficile detected.  Final  Gastrointestinal Panel by PCR , Stool     Status: None   Collection Time: 10/27/16  4:58 PM  Result Value Ref Range Status   Campylobacter species NOT DETECTED NOT DETECTED Final   Plesimonas shigelloides NOT DETECTED NOT DETECTED Final   Salmonella species NOT DETECTED NOT DETECTED Final   Yersinia enterocolitica NOT DETECTED NOT DETECTED Final   Vibrio species NOT DETECTED NOT DETECTED Final   Vibrio cholerae NOT DETECTED NOT DETECTED Final   Enteroaggregative E coli (EAEC) NOT DETECTED NOT DETECTED Final   Enteropathogenic E coli (EPEC) NOT DETECTED NOT DETECTED Final   Enterotoxigenic E coli (ETEC) NOT DETECTED NOT DETECTED Final   Shiga like toxin producing E coli (STEC) NOT DETECTED NOT DETECTED Final   Shigella/Enteroinvasive E coli (EIEC) NOT DETECTED NOT DETECTED Final   Cryptosporidium NOT DETECTED NOT DETECTED Final   Cyclospora cayetanensis NOT DETECTED NOT DETECTED Final   Entamoeba histolytica NOT DETECTED NOT DETECTED Final   Giardia lamblia NOT DETECTED NOT DETECTED Final   Adenovirus F40/41 NOT DETECTED NOT DETECTED Final   Astrovirus NOT DETECTED NOT DETECTED Final   Norovirus GI/GII NOT DETECTED NOT DETECTED Final   Rotavirus A NOT DETECTED NOT DETECTED Final   Sapovirus (I, II, IV, and V) NOT DETECTED NOT DETECTED Final   Studies/Results: Dg Chest 2 View  Result Date: 10/26/2016 CLINICAL DATA:  Cough for 2 weeks with fever today. History of ulcerative colitis. EXAM: CHEST  2 VIEW  COMPARISON:  Acute abdominal series done 09/29/2016. FINDINGS: The heart size and mediastinal contours are stable. There is a probable coronary artery stent. The lungs are clear. There is no pleural effusion or pneumothorax. Probable old rib fracture laterally on the right. No acute osseous findings are seen. IMPRESSION: No active cardiopulmonary process. Electronically Signed   By: Richardean Sale M.D.   On: 10/26/2016 18:43   Ct Abdomen Pelvis W Contrast  Result Date: 10/26/2016 CLINICAL DATA:  Malaise, chills. History of ulcerative colitis and has had a flare for  the past 3-4 weeks. Patient is on steroids. EXAM: CT ABDOMEN AND PELVIS WITH CONTRAST TECHNIQUE: Multidetector CT imaging of the abdomen and pelvis was performed using the standard protocol following bolus administration of intravenous contrast. CONTRAST:  164m ISOVUE-300 IOPAMIDOL (ISOVUE-300) INJECTION 61% COMPARISON:  09/28/2016 CT FINDINGS: Lower chest: Small hiatal hernia. Top normal-sized cardiac chambers with coronary arteriosclerosis. Bibasilar atelectasis. Minimal chronic right middle lobe scarring. No effusion or pneumothorax. Stable 7 mm right lower lobe lateral nodule. Hepatobiliary: Cholecystectomy with mild intrahepatic ductal dilatation which can be seen in the setting prior cholecystectomy. No choledocholithiasis. No space-occupying mass of the liver. Pancreas: Unremarkable. No pancreatic ductal dilatation or surrounding inflammatory changes. Spleen: Normal in size without focal abnormality. Adrenals/Urinary Tract: Adrenal glands are unremarkable. Kidneys are normal, without renal calculi, focal lesion, or hydronephrosis. Bladder is unremarkable. Stomach/Bowel: Stomach is moderately distended with contrast. Normal small bowel rotation. No small bowel dilatation to suggest obstruction. Normal-appearing appendix. Transmural thickening of large bowel mid transverse colon through rectum consistent with mild colitis. Vascular/Lymphatic:  Acute appearing thrombosis of the proximal inferior mesenteric vein with distention noted along its proximal half draining the sigmoid and rectosigmoid veins terminating at the left common vein, series 5 images 51 through 68. There is mesenteric fat stranding associated with this finding. No lymphadenopathy. Reproductive: Mild prostatomegaly with peripheral zone calcifications. Other: Tiny fat containing umbilical hernia. No abdominopelvic ascites. Musculoskeletal: Degenerative changes of the lumbar spine appear chronic. No acute osseous abnormality. IMPRESSION: 1. 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 2. Diffuse transmural thickening of proximal transverse colon through rectum consistent with colitis. 3. Otherwise chronic stable appearing changes as above. Electronically Signed   By: DAshley RoyaltyM.D.   On: 10/26/2016 23:15   Medications: I have reviewed the patient's current medications. Scheduled Meds: . aspirin EC  81 mg Oral BID  . atorvastatin  40 mg Oral Daily  . carvedilol  3.125 mg Oral BID WC  . cholecalciferol  2,000 Units Oral Daily  . docusate sodium  100 mg Oral BID  . insulin aspart  0-15 Units Subcutaneous Q6H  . levothyroxine  50 mcg Oral QAC breakfast  . meropenem  1 g Intravenous Q8H  . pantoprazole  40 mg Oral QAC breakfast  . ramipril  5 mg Oral Daily  . sodium chloride flush  3 mL Intravenous Q12H   Continuous Infusions: . heparin 1,000 Units/hr (10/27/16 2354)   PRN Meds:.acetaminophen **OR** acetaminophen, ondansetron **OR** ondansetron (ZOFRAN) IV   Assessment: Principal Problem:   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CC) Active Problems:   Ulcerative colitis (HCC)   Sepsis (HCC)   Fever   Elevated lactic acid level  Ulcerative colitis been on high dose steroids with decrease in bleeding but persistence of inflammation on imaging. Admitted with a clot in the [facility], complicated with ecoli bacteremia , per ID suspicious for ESBL on meropenem.  Presently his colitis seems to be mild.   Plan:  1. Continue antibiotics- after 24-48 hours of antibiotics will probably commence on entocort which has fewer systemic side effects than prednisone .   2. Going forward he would need step up therapy with biologic agent vs surgery - I would like him to be seen by a colrectal surgeon to discuss surgical options as well at a specialized IBD center. He is very reluctant to go to [facility] provided [facility] Explained I prefer him to meet the specialists while he is stable rather than when he is  very sick. He has been admitted thrice to hospital over the past few weeks .  Discussed with Dr Azalee Course earlier today  and she too agrees with the plan    LOS: 1 day   David [name] 10/28/2016, 3:27 PM

## 2016-10-28 NOTE — Progress Notes (Signed)
ANTICOAGULATION CONSULT NOTE - Initial Consult  Pharmacy Consult for heparin Indication: thrombosis of proximal inferior mesenteric vein  Allergies  Allergen Reactions  . Codeine Nausea And Vomiting and Other (See Comments)    Patient Measurements: Height: 6' (182.9 cm) Weight: 178 lb (80.7 kg) IBW/kg (Calculated) : 77.6 Heparin Dosing Weight: 79.4 kg  Vital Signs: Temp: 97.9 F (36.6 C) (01/17 0911) Temp Source: Oral (01/17 0911) BP: 115/59 (01/17 0911) Pulse Rate: 76 (01/17 0911)  Labs:  Recent Labs  10/26/16 1811 10/27/16 0143  10/27/16 1033 10/27/16 1956 10/28/16 0756 10/28/16 1549  HGB 10.9*  --   --  10.0*  --  9.1*  --   HCT 32.0*  --   --  28.3*  --  26.4*  --   PLT 266  --   --  164  --  166  --   APTT  --  36  --   --   --   --   --   LABPROT 14.4  --   --   --   --   --   --   INR 1.11  --   --   --   --   --   --   HEPARINUNFRC  --   --   < > 0.69 0.83* 0.53 0.38  CREATININE 1.12  --   --   --   --  0.88  --   < > = values in this interval not displayed.  Estimated Creatinine Clearance: 85.7 mL/min (by C-G formula based on SCr of 0.88 mg/dL).   Medical History: Past Medical History:  Diagnosis Date  . Anemia   . CAD (coronary artery disease) 07/08/2011   Overview:  a. 07/2006: Anterior ST elevation MI. b. 07/2006: PCI with BMS to LAD and RCA. c. 09/2006: Non ST elevation. d. LVEF 30%.  Discussed ICD, not currently interested.   . 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 ([place]) 2/64/1583  . Dyslipidemia 07/30/2011   Overview:  High triglycerides  . GERD (gastroesophageal reflux disease) 07/08/2011  . History of Clostridium difficile colitis   . History of myocardial infarction   . Hyperlipidemia, unspecified 07/08/2011  . Hypothyroidism   . 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 Type II diabetes mellitus, uncontrolled ([place]) 07/08/2011  . Vitamin D deficiency     Medications:  Infusions:  . heparin 1,000  Units/hr (10/27/16 2354)    Assessment: 70 yom cc fever and cough. Sepsis with UC, on abx. 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 No AC on PTA list. Some recent bleeding related to UC, Hgb 10.9 on 1/15. Continue to follow Hgb.   Goal of Therapy:  Heparin level 0.3-0.7 units/ml Monitor platelets by anticoagulation protocol: Yes   Plan:  HL=0.53 therapeutic. Continue current rate of 1000 units/hr. Recheck in 8 hours for a second confirmation level.  1/17:  HL @ 15:49 = 0.38  Will continue this pt at current rate of 1000 units/hr.  Will recheck HL on 1/18 with AM labs.   Makennah Omura D, Pharm.D. Clinical Pharmacist 10/28/2016,4:34 PM

## 2016-10-28 NOTE — Progress Notes (Signed)
[place] [facility] [place] NAME: Haseeb Fiallos    MR#:  [id number]  DATE OF BIRTH:  [birth year]  SUBJECTIVE:  CHIEF COMPLAINT:   Chief Complaint  Patient presents with  . Fever  . Cough  He is feeling better, he continues to reiterate that he would prefer to stay here rather than going to [facility] with some concern for insurance coverage REVIEW OF SYSTEMS:  Review of Systems  Constitutional: Positive for chills, fever and malaise/fatigue. Negative for weight loss.  HENT: Negative for nosebleeds and sore throat.   Eyes: Negative for blurred vision.  Respiratory: Negative for cough, shortness of breath and wheezing.   Cardiovascular: Negative for chest pain, orthopnea, leg swelling and PND.  Gastrointestinal: Positive for diarrhea. Negative for abdominal pain, constipation, heartburn, nausea and vomiting.  Genitourinary: Negative for dysuria and urgency.  Musculoskeletal: Negative for back pain.  Skin: Negative for rash.  Neurological: Positive for weakness. Negative for dizziness, speech change, focal weakness and headaches.  Endo/Heme/Allergies: Does not bruise/bleed easily.  Psychiatric/Behavioral: Negative for depression.   DRUG ALLERGIES:   Allergies  Allergen Reactions  . Codeine Nausea And Vomiting and Other (See Comments)   VITALS:  Blood pressure (!) 115/59, pulse 76, temperature 97.9 F (36.6 C), temperature source Oral, resp. rate 18, height 6' (1.829 m), weight 80.7 kg (178 lb), SpO2 91 %. PHYSICAL EXAMINATION:  Physical Exam  Constitutional: He is oriented to person, place, and time and well-developed, well-nourished, and in no distress.  HENT:  Head: Normocephalic and atraumatic.  Eyes: Conjunctivae and EOM are normal. Pupils are equal, round, and reactive to light.  Neck: Normal range of motion. Neck supple. No tracheal deviation present. No thyromegaly present.  Cardiovascular: Normal rate, regular rhythm and  normal heart sounds.   Pulmonary/Chest: Effort normal and breath sounds normal. No respiratory distress. He has no wheezes. He exhibits no tenderness.  Abdominal: Soft. Bowel sounds are normal. He exhibits no distension. There is no tenderness.  Musculoskeletal: Normal range of motion.  Neurological: He is alert and oriented to person, place, and time. No cranial nerve deficit.  Skin: Skin is warm and dry. No rash noted.  Psychiatric: Mood and affect normal.   LABORATORY PANEL:   CBC  Recent Labs Lab 10/28/16 0756  WBC 9.0  HGB 9.1*  HCT 26.4*  PLT 166   ------------------------------------------------------------------------------------------------------------------ Chemistries   Recent Labs Lab 10/26/16 1811 10/28/16 0756  NA 132* 137  K 3.8 2.6*  CL 98* 110  CO2 24 22  GLUCOSE 429* 137*  BUN 16 12  CREATININE 1.12 0.88  CALCIUM 8.0* 6.7*  MG  --  1.6*  AST 25  --   ALT 15*  --   ALKPHOS 121  --   BILITOT 0.9  --    RADIOLOGY:  No results found. ASSESSMENT AND PLAN:  This is a 71 year old male admitted for sepsis secondary to colitis and mesenteric ischemia.  1. Sepsis: present on admission - His bcx are negative, C dff is negative.  - Appreciate ID input  - Antibiotics changed to meropenem considering concern for ESBL Escherichia coli  2. Mesenteric ischemia: Symptoms not classically consistent with this diagnosis however the patient does have a clot initiated on CT angiogram of the abdomen for which continue heparin. He is not in much pain.  3. Ulcerative colitis: The patient had been in remission for the last 10 years after receiving Remicade. He has been reluctant to receive  another treatment with his more recent flares due to concerns regarding malignancy associated with immunotherapy.  - Continue antibiotics- after 24-48 hours of antibiotics - GI May start him on entocort which has fewer systemic side effects than prednisone.   4. Coronary artery  disease: Stable. Continue aspirin 5. Hypertension: Controlled; continue carvedilol and ACE inhibitor 6. Hyperlipidemia: Continue statin therapy 7. Hypothyroidism: Continue Synthroid.  8. DVT prophylaxis: Therapeutic anticoagulation as above 9. GI prophylaxis: Pantoprazole per home regimen  - if worsens clinically, consider transfer ICU-Stepdown as patient refusing transfer for now and prefers to stay here   All the records are reviewed and case discussed with Care Management/Social Worker. Management plans discussed with the patient, Dr Vicente Males, Nursing and they are in agreement.  CODE STATUS: FULL CODE  TOTAL TIME TAKING CARE OF THIS PATIENT: 35 minutes.   More than 50% of the time was spent in counseling/coordination of care: YES  POSSIBLE D/C IN 2-3 DAYS, DEPENDING ON CLINICAL CONDITION.   Max Sane M.D on 10/28/2016 at 4:20 PM  Between 7am to 6pm - Pager - [phone]  After 6pm go to [email or web address] - Proofreader  [facility]  Office  [phone]  CC: Primary care physician; Madelyn Brunner, MD  Note: This dictation was prepared with Dragon dictation along with smaller phrase technology. Any transcriptional errors that result from this process are unintentional.

## 2016-10-28 NOTE — Progress Notes (Signed)
10/28/2016  8:50 AM  Notified by lab of critical potassium value of 2.6.  Sent page notifying attending MD Dr. Manuella Ghazi.  Will await and carry out interventions ordered.  Dola Argyle, RN

## 2016-10-28 NOTE — Progress Notes (Signed)
ID Enote No current fever, BCX + ecoli  REc Dc vanco Change to meropenem pending cx results - high risk of ESBL E coli given recent hospitalization and abx use WIll base final abx recs on culture results

## 2016-10-28 NOTE — Progress Notes (Signed)
10/28/2016  8:13 AM  Pt was upset because BSC was moved into the pt bathroom.  Explained that because of his fall risk assessment, he was deemed moderate risk.  Explained what this meant and that it would be safest to have a bed alarm and call for assistance before getting up to use the bathroom.  Pt became angry, stated that he had been getting to [facility] and back to bed independently since he was admitted.  He insisted that he needed no bed alarm.  Pt understands risks and rationaole and has decided to refuse bed alarm going forward, despite fall risk assessment score of 10.  Dola Argyle, RN

## 2016-10-28 NOTE — Progress Notes (Signed)
ANTICOAGULATION CONSULT NOTE - Initial Consult  Pharmacy Consult for heparin Indication: thrombosis of proximal inferior mesenteric vein  Allergies  Allergen Reactions  . Codeine Nausea And Vomiting and Other (See Comments)    Patient Measurements: Height: 6' (182.9 cm) Weight: 178 lb (80.7 kg) IBW/kg (Calculated) : 77.6 Heparin Dosing Weight: 79.4 kg  Vital Signs: Temp: 97.9 F (36.6 C) (01/17 0911) Temp Source: Oral (01/17 0911) BP: 115/59 (01/17 0911) Pulse Rate: 76 (01/17 0911)  Labs:  Recent Labs  10/26/16 1811 10/27/16 0143 10/27/16 1033 10/27/16 1956 10/28/16 0756  HGB 10.9*  --  10.0*  --  9.1*  HCT 32.0*  --  28.3*  --  26.4*  PLT 266  --  164  --  166  APTT  --  36  --   --   --   LABPROT 14.4  --   --   --   --   INR 1.11  --   --   --   --   HEPARINUNFRC  --   --  0.69 0.83* 0.53  CREATININE 1.12  --   --   --  0.88    Estimated Creatinine Clearance: 85.7 mL/min (by C-G formula based on SCr of 0.88 mg/dL).   Medical History: Past Medical History:  Diagnosis Date  . Anemia   . CAD (coronary artery disease) 07/08/2011   Overview:  a. 07/2006: Anterior ST elevation MI. b. 07/2006: PCI with BMS to LAD and RCA. c. 09/2006: Non ST elevation. d. LVEF 30%.  Discussed ICD, not currently interested.   . 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 ([place]) 6/73/4193  . Dyslipidemia 07/30/2011   Overview:  High triglycerides  . GERD (gastroesophageal reflux disease) 07/08/2011  . History of Clostridium difficile colitis   . History of myocardial infarction   . Hyperlipidemia, unspecified 07/08/2011  . Hypothyroidism   . 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 Type II diabetes mellitus, uncontrolled ([place]) 07/08/2011  . Vitamin D deficiency     Medications:  Infusions:  . dextrose 5 % and 0.45% NaCl 75 mL/hr at 10/28/16 0148  . heparin 1,000 Units/hr (10/27/16 2354)    Assessment: 70 yom cc fever and cough. Sepsis with UC,  on abx. 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 No AC on PTA list. Some recent bleeding related to UC, Hgb 10.9 on 1/15. Continue to follow Hgb.   Goal of Therapy:  Heparin level 0.3-0.7 units/ml Monitor platelets by anticoagulation protocol: Yes   Plan:  HL=0.53 therapeutic. Continue current rate of 1000 units/hr. Recheck in 8 hours for a second confirmation level.  Ramond Dial, Pharm.D., BCPS Clinical Pharmacist 10/28/2016,9:12 AM

## 2016-10-29 LAB — BASIC METABOLIC PANEL
ANION GAP: 7 (ref 5–15)
Anion gap: 6 (ref 5–15)
BUN: 9 mg/dL (ref 6–20)
BUN: 7 mg/dL (ref 6–20)
CALCIUM: 7.2 mg/dL — AB (ref 8.9–10.3)
CO2: 21 mmol/L — ABNORMAL LOW (ref 22–32)
CO2: 21 mmol/L — ABNORMAL LOW (ref 22–32)
Calcium: 7.3 mg/dL — ABNORMAL LOW (ref 8.9–10.3)
Chloride: 111 mmol/L (ref 101–111)
Chloride: 110 mmol/L (ref 101–111)
Creatinine, Ser: 0.64 mg/dL (ref 0.61–1.24)
Creatinine, Ser: 0.74 mg/dL (ref 0.61–1.24)
GFR calc Af Amer: >60 mL/min (ref >60)
GLUCOSE: 93 mg/dL (ref 65–99)
Glucose, Bld: 163 mg/dL — ABNORMAL HIGH (ref 65–99)
POTASSIUM: 3.1 mmol/L — AB (ref 3.5–5.1)
Potassium: 2.7 mmol/L — CL (ref 3.5–5.1)
SODIUM: 137 mmol/L (ref 135–145)
Sodium: 139 mmol/L (ref 135–145)

## 2016-10-29 LAB — CBC
HCT: 27.3 % — ABNORMAL LOW (ref 40.0–52.0)
HEMOGLOBIN: 9.6 g/dL — AB (ref 13.0–18.0)
MCH: 31.8 pg (ref 26.0–34.0)
MCHC: 35.3 g/dL (ref 32.0–36.0)
MCV: 89.9 fL (ref 80.0–100.0)
Platelets: 177 10*3/uL (ref 150–440)
RBC: 3.03 MIL/uL — AB (ref 4.40–5.90)
RDW: 15 % — ABNORMAL HIGH (ref 11.5–14.5)
WBC: 7.5 10*3/uL (ref 3.8–10.6)

## 2016-10-29 LAB — MAGNESIUM
MAGNESIUM: 1.7 mg/dL (ref 1.7–2.4)
MAGNESIUM: 2.1 mg/dL (ref 1.7–2.4)

## 2016-10-29 LAB — GLUCOSE, CAPILLARY
Glucose-Capillary: 123 mg/dL — ABNORMAL HIGH (ref 65–99)
Glucose-Capillary: 239 mg/dL — ABNORMAL HIGH (ref 65–99)
Glucose-Capillary: 166 mg/dL — ABNORMAL HIGH (ref 65–99)
Glucose-Capillary: 135 mg/dL — ABNORMAL HIGH (ref 65–99)

## 2016-10-29 LAB — ALBUMIN: Albumin: 1.7 g/dL — ABNORMAL LOW (ref 3.5–5.0)

## 2016-10-29 LAB — PHOSPHORUS
PHOSPHORUS: 1.6 mg/dL — AB (ref 2.5–4.6)
Phosphorus: 1.9 mg/dL — ABNORMAL LOW (ref 2.5–4.6)

## 2016-10-29 LAB — HEPARIN LEVEL (UNFRACTIONATED)
HEPARIN UNFRACTIONATED: 0.31 [IU]/mL (ref 0.30–0.70)
Heparin Unfractionated: 0.27 IU/mL — ABNORMAL LOW (ref 0.30–0.70)
Heparin Unfractionated: 0.37 IU/mL (ref 0.30–0.70)

## 2016-10-29 MED ORDER — SODIUM CHLORIDE 0.9 % IV SOLN
30.0000 meq | Freq: Once | INTRAVENOUS | Status: AC
  Administered 2016-10-29: 30 meq via INTRAVENOUS
  Filled 2016-10-29: qty 15

## 2016-10-29 MED ORDER — BOOST / RESOURCE BREEZE PO LIQD
1.0000 | Freq: Three times a day (TID) | ORAL | Status: DC
  Administered 2016-10-29 (×3): 1 via ORAL

## 2016-10-29 MED ORDER — SODIUM CHLORIDE 0.9 % IV SOLN: 30.0000 meq | INTRAVENOUS | Status: DC

## 2016-10-29 MED ORDER — MAGNESIUM SULFATE 2 GM/50ML IV SOLN
2.0000 g | Freq: Once | INTRAVENOUS | Status: AC
  Administered 2016-10-29: 2 g via INTRAVENOUS

## 2016-10-29 MED ORDER — POTASSIUM PHOSPHATES 15 MMOLE/5ML IV SOLN
20.0000 mmol | Freq: Once | INTRAVENOUS | Status: AC
  Administered 2016-10-29: 20 mmol via INTRAVENOUS
  Filled 2016-10-29: qty 6.67

## 2016-10-29 MED ORDER — SODIUM CHLORIDE 0.9 % IV SOLN
30.0000 meq | INTRAVENOUS | Status: DC
  Administered 2016-10-29: 30 meq via INTRAVENOUS
  Filled 2016-10-29 (×2): qty 15

## 2016-10-29 MED ORDER — MAGNESIUM SULFATE 2 GM/50ML IV SOLN
2.0000 g | Freq: Once | INTRAVENOUS | Status: DC
  Filled 2016-10-29: qty 50

## 2016-10-29 MED ORDER — SODIUM CHLORIDE 0.9 % IV SOLN
1.0000 g | Freq: Once | INTRAVENOUS | Status: AC
  Administered 2016-10-29: 1 g via INTRAVENOUS
  Filled 2016-10-29: qty 10

## 2016-10-29 NOTE — Progress Notes (Signed)
9 beat asym v tach overnight Check mg and k they were low previously\ Pharmacy cnsult fro repletion  Red diarrhea: CBC in am  GI on case

## 2016-10-29 NOTE — Progress Notes (Signed)
MEDICATION RELATED CONSULT NOTE - INITIAL   Pharmacy Consult for electrolyte monitoring and replacement Indication: hypokalemia/VTach  Allergies  Allergen Reactions  . Codeine Nausea And Vomiting and Other (See Comments)    Patient Measurements: Height: 6' (182.9 cm) Weight: 178 lb (80.7 kg) IBW/kg (Calculated) : 77.6 Adjusted Body Weight:   Vital Signs: Temp: 98.1 F (36.7 C) (01/17 2048) Temp Source: Oral (01/17 2048) BP: 112/61 (01/17 2048) Pulse Rate: 78 (01/17 2048) Intake/Output from previous day: 01/17 0701 - 01/18 0700 In: 385 [P.O.:120; I.V.:215; IV Piggyback:50] Out: -  Intake/Output from this shift: Total I/O In: 118 [I.V.:68; IV Piggyback:50] Out: -   Labs:  Recent Labs  10/26/16 1811 10/27/16 0143 10/27/16 1033 10/28/16 0756 10/29/16 0155  WBC 11.9*  --  10.7* 9.0 7.5  HGB 10.9*  --  10.0* 9.1* 9.6*  HCT 32.0*  --  28.3* 26.4* 27.3*  PLT 266  --  164 166 177  APTT  --  36  --   --   --   CREATININE 1.12  --   --  0.88 0.64  MG  --   --   --  1.6* 1.7  PHOS  --   --   --   --  1.6*  ALBUMIN 2.3*  --   --   --   --   PROT 5.9*  --   --   --   --   AST 25  --   --   --   --   ALT 15*  --   --   --   --   ALKPHOS 121  --   --   --   --   BILITOT 0.9  --   --   --   --    Estimated Creatinine Clearance: 94.3 mL/min (by C-G formula based on SCr of 0.64 mg/dL).   Microbiology: Recent Results (from the past 720 hour(s))  Gastrointestinal Panel by PCR , Stool     Status: None   Collection Time: 10/06/16  7:30 PM  Result Value Ref Range Status   Campylobacter species NOT DETECTED NOT DETECTED Final   Plesimonas shigelloides NOT DETECTED NOT DETECTED Final   Salmonella species NOT DETECTED NOT DETECTED Final   Yersinia enterocolitica NOT DETECTED NOT DETECTED Final   Vibrio species NOT DETECTED NOT DETECTED Final   Vibrio cholerae NOT DETECTED NOT DETECTED Final   Enteroaggregative E coli (EAEC) NOT DETECTED NOT DETECTED Final   Enteropathogenic  E coli (EPEC) NOT DETECTED NOT DETECTED Final   Enterotoxigenic E coli (ETEC) NOT DETECTED NOT DETECTED Final   Shiga like toxin producing E coli (STEC) NOT DETECTED NOT DETECTED Final   Shigella/Enteroinvasive E coli (EIEC) NOT DETECTED NOT DETECTED Final   Cryptosporidium NOT DETECTED NOT DETECTED Final   Cyclospora cayetanensis NOT DETECTED NOT DETECTED Final   Entamoeba histolytica NOT DETECTED NOT DETECTED Final   Giardia lamblia NOT DETECTED NOT DETECTED Final   Adenovirus F40/41 NOT DETECTED NOT DETECTED Final   Astrovirus NOT DETECTED NOT DETECTED Final   Norovirus GI/GII NOT DETECTED NOT DETECTED Final   Rotavirus A NOT DETECTED NOT DETECTED Final   Sapovirus (I, II, IV, and V) NOT DETECTED NOT DETECTED Final  C difficile quick scan w PCR reflex     Status: None   Collection Time: 10/06/16  7:30 PM  Result Value Ref Range Status   C Diff antigen NEGATIVE NEGATIVE Final   C Diff toxin NEGATIVE NEGATIVE Final  C Diff interpretation No C. difficile detected.  Final  Culture, blood (Routine x 2)     Status: None (Preliminary result)   Collection Time: 10/26/16  6:12 PM  Result Value Ref Range Status   Specimen Description BLOOD RIGHT FA  Final   Special Requests   Final    BOTTLES DRAWN AEROBIC AND ANAEROBIC AER 16ML ANA 16ML   Culture  Setup Time   Final    Organism ID to follow GRAM NEGATIVE RODS ANAEROBIC BOTTLE ONLY CRITICAL RESULT CALLED TO, READ BACK BY AND VERIFIED WITH: [name] HALLAJI 10/27/16 @ 3 [place]    Culture GRAM NEGATIVE RODS  Final   Report Status PENDING  Incomplete  Culture, blood (Routine x 2)     Status: None (Preliminary result)   Collection Time: 10/26/16  6:12 PM  Result Value Ref Range Status   Specimen Description BLOOD LEFT FA  Final   Special Requests   Final    BOTTLES DRAWN AEROBIC AND ANAEROBIC AER 11ML ANA 10ML   Culture NO GROWTH 2 DAYS  Final   Report Status PENDING  Incomplete  Blood Culture ID Panel (Reflexed)     Status: Abnormal    Collection Time: 10/26/16  6:12 PM  Result Value Ref Range Status   Enterococcus species NOT DETECTED NOT DETECTED Final   Listeria monocytogenes NOT DETECTED NOT DETECTED Final   Staphylococcus species NOT DETECTED NOT DETECTED Final   Staphylococcus aureus NOT DETECTED NOT DETECTED Final   Streptococcus species NOT DETECTED NOT DETECTED Final   Streptococcus agalactiae NOT DETECTED NOT DETECTED Final   Streptococcus pneumoniae NOT DETECTED NOT DETECTED Final   Streptococcus pyogenes NOT DETECTED NOT DETECTED Final   Acinetobacter baumannii NOT DETECTED NOT DETECTED Final   Enterobacteriaceae species DETECTED (A) NOT DETECTED Final    Comment: CRITICAL RESULT CALLED TO, READ BACK BY AND VERIFIED WITH: [name] HALLAJI 10/27/16 2220 [place]    Enterobacter cloacae complex NOT DETECTED NOT DETECTED Final   Escherichia coli DETECTED (A) NOT DETECTED Final    Comment: CRITICAL RESULT CALLED TO, READ BACK BY AND VERIFIED WITH: [name] HALLAJI 10/27/16 2220 [place]    Klebsiella oxytoca NOT DETECTED NOT DETECTED Final   Klebsiella pneumoniae NOT DETECTED NOT DETECTED Final   Proteus species NOT DETECTED NOT DETECTED Final   Serratia marcescens NOT DETECTED NOT DETECTED Final   Carbapenem resistance NOT DETECTED NOT DETECTED Final   Haemophilus influenzae NOT DETECTED NOT DETECTED Final   Neisseria meningitidis NOT DETECTED NOT DETECTED Final   Pseudomonas aeruginosa NOT DETECTED NOT DETECTED Final   Candida albicans NOT DETECTED NOT DETECTED Final   Candida glabrata NOT DETECTED NOT DETECTED Final   Candida krusei NOT DETECTED NOT DETECTED Final   Candida parapsilosis NOT DETECTED NOT DETECTED Final   Candida tropicalis NOT DETECTED NOT DETECTED Final  Urine culture     Status: Abnormal   Collection Time: 10/26/16  8:30 PM  Result Value Ref Range Status   Specimen Description URINE, RANDOM  Final   Special Requests NONE  Final   Culture (A)  Final    <10,000 COLONIES/mL INSIGNIFICANT  GROWTH Performed at [facility], [address], [place], [place] [address]    Report Status 10/28/2016 FINAL  Final  C difficile quick scan w PCR reflex     Status: None   Collection Time: 10/27/16  4:58 PM  Result Value Ref Range Status   C Diff antigen NEGATIVE NEGATIVE Final   C Diff toxin NEGATIVE  NEGATIVE Final   C Diff interpretation No C. difficile detected.  Final  Gastrointestinal Panel by PCR , Stool     Status: None   Collection Time: 10/27/16  4:58 PM  Result Value Ref Range Status   Campylobacter species NOT DETECTED NOT DETECTED Final   Plesimonas shigelloides NOT DETECTED NOT DETECTED Final   Salmonella species NOT DETECTED NOT DETECTED Final   Yersinia enterocolitica NOT DETECTED NOT DETECTED Final   Vibrio species NOT DETECTED NOT DETECTED Final   Vibrio cholerae NOT DETECTED NOT DETECTED Final   Enteroaggregative E coli (EAEC) NOT DETECTED NOT DETECTED Final   Enteropathogenic E coli (EPEC) NOT DETECTED NOT DETECTED Final   Enterotoxigenic E coli (ETEC) NOT DETECTED NOT DETECTED Final   Shiga like toxin producing E coli (STEC) NOT DETECTED NOT DETECTED Final   Shigella/Enteroinvasive E coli (EIEC) NOT DETECTED NOT DETECTED Final   Cryptosporidium NOT DETECTED NOT DETECTED Final   Cyclospora cayetanensis NOT DETECTED NOT DETECTED Final   Entamoeba histolytica NOT DETECTED NOT DETECTED Final   Giardia lamblia NOT DETECTED NOT DETECTED Final   Adenovirus F40/41 NOT DETECTED NOT DETECTED Final   Astrovirus NOT DETECTED NOT DETECTED Final   Norovirus GI/GII NOT DETECTED NOT DETECTED Final   Rotavirus A NOT DETECTED NOT DETECTED Final   Sapovirus (I, II, IV, and V) NOT DETECTED NOT DETECTED Final    Medical History: Past Medical History:  Diagnosis Date  . Anemia   . CAD (coronary artery disease) 07/08/2011   Overview:  a. 07/2006: Anterior ST elevation MI. b. 07/2006: PCI with BMS to LAD and RCA. c. 09/2006: Non ST elevation. d. LVEF 30%.  Discussed ICD,  not currently interested.   . 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 Dyslipidemia 07/30/2011   Overview:  High triglycerides  . GERD (gastroesophageal reflux disease) 07/08/2011  . History of Clostridium difficile colitis   . History of myocardial infarction   . Hyperlipidemia, unspecified 07/08/2011  . Hypothyroidism   . 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 Type II diabetes mellitus, uncontrolled ([place]) 07/08/2011  . Vitamin D deficiency     Medications:  Infusions:  . heparin 1,000 Units/hr (10/28/16 2000)    Assessment: 83 yom admitted for chills with worsening diarrhea. Fever on admission, ID started meropenem. GI following for UC and diarrhea. Tonight patient had VTach and low electrolytes. K 2.7, Mg 1.7, phos 1.6, Ca 7.2 corrected to 8.5.   Goal of Therapy:  Electrolytes WNL  Plan:  1. Calcium gluconate 1 gm IV x 1  2. Potassium chloride 30 mEq IV Q4H x 2 doses 3. Potassium phosphate 20 mmol IV x 1 4. Magnesium sulfate 2 gm IV x 1 5. Recheck electrolytes this afternoon after infusions complete   Laural Benes, Pharm.D., BCPS Clinical pharmacist 10/29/2016,3:40 AM

## 2016-10-29 NOTE — Progress Notes (Addendum)
ANTICOAGULATION CONSULT NOTE - Initial Consult  Pharmacy Consult for heparin Indication: thrombosis of proximal inferior mesenteric vein  Allergies  Allergen Reactions  . Codeine Nausea And Vomiting and Other (See Comments)    Patient Measurements: Height: 6' (182.9 cm) Weight: 183 lb (83 kg) IBW/kg (Calculated) : 77.6 Heparin Dosing Weight: 79.4 kg  Vital Signs: Temp: 98.3 F (36.8 C) (01/18 0757) Temp Source: Oral (01/18 0757) BP: 121/62 (01/18 0757) Pulse Rate: 164 (01/18 0757)  Labs:  Recent Labs  10/26/16 1811 10/27/16 0143 10/27/16 1033  10/28/16 0756 10/28/16 1549 10/29/16 0155 10/29/16 1404  HGB 10.9*  --  10.0*  --  9.1*  --  9.6*  --   HCT 32.0*  --  28.3*  --  26.4*  --  27.3*  --   PLT 266  --  164  --  166  --  177  --   APTT  --  36  --   --   --   --   --   --   LABPROT 14.4  --   --   --   --   --   --   --   INR 1.11  --   --   --   --   --   --   --   HEPARINUNFRC  --   --  0.69  < > 0.53 0.38 0.31 0.27*  CREATININE 1.12  --   --   --  0.88  --  0.64 0.74  < > = values in this interval not displayed.  Estimated Creatinine Clearance: 94.3 mL/min (by C-G formula based on SCr of 0.74 mg/dL).   Medical History: Past Medical History:  Diagnosis Date  . Anemia   . CAD (coronary artery disease) 07/08/2011   Overview:  a. 07/2006: Anterior ST elevation MI. b. 07/2006: PCI with BMS to LAD and RCA. c. 09/2006: Non ST elevation. d. LVEF 30%.  Discussed ICD, not currently interested.   . 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 Dyslipidemia 07/30/2011   Overview:  High triglycerides  . GERD (gastroesophageal reflux disease) 07/08/2011  . History of Clostridium difficile colitis   . History of myocardial infarction   . Hyperlipidemia, unspecified 07/08/2011  . Hypothyroidism   . 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 Type II diabetes mellitus, uncontrolled ([place]) 07/08/2011  . Vitamin D deficiency      Medications:  Infusions:  . heparin 1,000 Units/hr (10/28/16 2000)    Assessment: 70 yom cc fever and cough. Sepsis with UC, on abx. 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 No AC on PTA list. Some recent bleeding related to UC, Hgb 10.9 on 1/15. Continue to follow Hgb.   Goal of Therapy:  Heparin level 0.3-0.7 units/ml Monitor platelets by anticoagulation protocol: Yes   Plan:  HL=0.27 slightly subtherapeutic. Due to pt nose bleeds and blood in diarrhea will not bolus but will increase rate to 1150 units/hr. Recheck in 8 hours.  1/18 2328 HL therapeutic x 1. Continue current rate. Will recheck HL in 8 hours. [place], Pharm.D., BCPS Clinical Pharmacist 10/29/2016,2:55 PM

## 2016-10-29 NOTE — Progress Notes (Addendum)
ANTICOAGULATION CONSULT NOTE - Initial Consult  Pharmacy Consult for heparin Indication: thrombosis of proximal inferior mesenteric vein  Allergies  Allergen Reactions  . Codeine Nausea And Vomiting and Other (See Comments)    Patient Measurements: Height: 6' (182.9 cm) Weight: 183 lb (83 kg) IBW/kg (Calculated) : 77.6 Heparin Dosing Weight: 79.4 kg  Vital Signs: Temp: 98.9 F (37.2 C) (01/18 0429) Temp Source: Oral (01/18 0429) BP: 126/62 (01/18 0429) Pulse Rate: 75 (01/18 0429)  Labs:  Recent Labs  10/26/16 1811 10/27/16 0143 10/27/16 1033  10/28/16 0756 10/28/16 1549 10/29/16 0155  HGB 10.9*  --  10.0*  --  9.1*  --  9.6*  HCT 32.0*  --  28.3*  --  26.4*  --  27.3*  PLT 266  --  164  --  166  --  177  APTT  --  36  --   --   --   --   --   LABPROT 14.4  --   --   --   --   --   --   INR 1.11  --   --   --   --   --   --   HEPARINUNFRC  --   --  0.69  < > 0.53 0.38 0.31  CREATININE 1.12  --   --   --  0.88  --  0.64  < > = values in this interval not displayed.  Estimated Creatinine Clearance: 94.3 mL/min (by C-G formula based on SCr of 0.64 mg/dL).   Medical History: Past Medical History:  Diagnosis Date  . Anemia   . CAD (coronary artery disease) 07/08/2011   Overview:  a. 07/2006: Anterior ST elevation MI. b. 07/2006: PCI with BMS to LAD and RCA. c. 09/2006: Non ST elevation. d. LVEF 30%.  Discussed ICD, not currently interested.   . 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 Dyslipidemia 07/30/2011   Overview:  High triglycerides  . GERD (gastroesophageal reflux disease) 07/08/2011  . History of Clostridium difficile colitis   . History of myocardial infarction   . Hyperlipidemia, unspecified 07/08/2011  . Hypothyroidism   . 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 Type II diabetes mellitus, uncontrolled ([place]) 07/08/2011  . Vitamin D deficiency     Medications:  Infusions:  . heparin 1,000 Units/hr  (10/28/16 2000)    Assessment: 70 yom cc fever and cough. Sepsis with UC, on abx. 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 No AC on PTA list. Some recent bleeding related to UC, Hgb 10.9 on 1/15. Continue to follow Hgb.   Goal of Therapy:  Heparin level 0.3-0.7 units/ml Monitor platelets by anticoagulation protocol: Yes   Plan:  HL=0.53 therapeutic. Continue current rate of 1000 units/hr. Recheck in 8 hours for a second confirmation level.  1/17:  HL @ 15:49 = 0.38  Will continue this pt at current rate of 1000 units/hr.  Will recheck HL on 1/18 with AM labs.   1/18 0155 HL therapeutic. Continue current rate. Will recheck HL this afternoon (instead of with AM labs) due to decreasing and borderline therapeutic level.   Laural Benes, Pharm.D., BCPS Clinical Pharmacist 10/29/2016,6:34 AM

## 2016-10-29 NOTE — Progress Notes (Addendum)
[facility] NAME: Kyrian Stage    MR#:  [id number]  DATE OF BIRTH:  06/02/1946  SUBJECTIVE:  CHIEF COMPLAINT:   Chief Complaint  Patient presents with  . Fever  . Cough  says he's got sinus infection, having some nose bleed, also had 9 beats of asymptomatic v tech last night. Minor blood in diarrhea stool as well last evening. wants to advance diet to FLD REVIEW OF SYSTEMS:  Review of Systems  Constitutional: Positive for malaise/fatigue. Negative for weight loss.  HENT: Positive for nosebleeds. Negative for sore throat.   Eyes: Negative for blurred vision.  Respiratory: Negative for cough, shortness of breath and wheezing.   Cardiovascular: Negative for chest pain, orthopnea, leg swelling and PND.  Gastrointestinal: Positive for blood in stool and diarrhea. Negative for abdominal pain, constipation, heartburn, nausea and vomiting.  Genitourinary: Negative for dysuria and urgency.  Musculoskeletal: Negative for back pain.  Skin: Negative for rash.  Neurological: Positive for weakness. Negative for dizziness, speech change, focal weakness and headaches.  Endo/Heme/Allergies: Does not bruise/bleed easily.  Psychiatric/Behavioral: Negative for depression.   DRUG ALLERGIES:   Allergies  Allergen Reactions  . Codeine Nausea And Vomiting and Other (See Comments)   VITALS:  Blood pressure 121/62, pulse (!) 164, temperature 98.3 F (36.8 C), temperature source Oral, resp. rate 16, height 6' (1.829 m), weight 83 kg (183 lb), SpO2 98 %. PHYSICAL EXAMINATION:  Physical Exam  Constitutional: He is oriented to person, place, and time and well-developed, well-nourished, and in no distress.  HENT:  Head: Normocephalic and atraumatic.  Eyes: Conjunctivae and EOM are normal. Pupils are equal, round, and reactive to light.  Neck: Normal range of motion. Neck supple. No tracheal deviation present. No thyromegaly present.    Cardiovascular: Normal rate, regular rhythm and normal heart sounds.   Pulmonary/Chest: Effort normal and breath sounds normal. No respiratory distress. He has no wheezes. He exhibits no tenderness.  Abdominal: Soft. Bowel sounds are normal. He exhibits no distension. There is no tenderness.  Musculoskeletal: Normal range of motion.  Neurological: He is alert and oriented to person, place, and time. No cranial nerve deficit.  Skin: Skin is warm and dry. No rash noted.  Psychiatric: Mood and affect normal.   LABORATORY PANEL:   CBC  Recent Labs Lab 10/29/16 0155  WBC 7.5  HGB 9.6*  HCT 27.3*  PLT 177   ------------------------------------------------------------------------------------------------------------------ Chemistries   Recent Labs Lab 10/26/16 1811  10/29/16 0155  NA 132*  < > 139  K 3.8  < > 2.7*  CL 98*  < > 111  CO2 24  < > 21*  GLUCOSE 429*  < > 93  BUN 16  < > 9  CREATININE 1.12  < > 0.64  CALCIUM 8.0*  < > 7.2*  MG  --   < > 1.7  AST 25  --   --   ALT 15*  --   --   ALKPHOS 121  --   --   BILITOT 0.9  --   --   < > = values in this interval not displayed. RADIOLOGY:  No results found. ASSESSMENT AND PLAN:  This is a 71 year old male admitted for sepsis secondary to colitis and mesenteric ischemia.  1. E.coli Sepsis: present on admission - His bcx growing E.coli, C dff is negative.  - Appreciate ID input  - Antibiotics changed to meropenem considering concern for ESBL Escherichia coli -  tolerated CLD - advance to FLD  2. Mesenteric ischemia: continue heparin. May need to hold if he continues to have nose bleed/GI bleed  3. Ulcerative colitis: The patient had been in remission for the last 10 years after receiving Remicade. He has been reluctant to receive another treatment with his more recent flares due to concerns regarding malignancy associated with immunotherapy.  - Continue antibiotics- after 24-48 hours of antibiotics - GI May start him on  entocort which has fewer systemic side effects than prednisone.   4. Coronary artery disease: Stable. Continue aspirin 5. Hypertension: Controlled; continue carvedilol and ACE inhibitor 6. Hyperlipidemia: Continue statin therapy 7. Hypothyroidism: Continue Synthroid.  8. DVT prophylaxis: Therapeutic anticoagulation as above 9. GI prophylaxis: Pantoprazole per home regimen  - if worsens clinically, consider transfer ICU-Stepdown as patient refusing tertiary care transfer for now and prefers to stay here   All the records are reviewed and case discussed with Care Management/Social Worker. Management plans discussed with the patient, Dr Vicente Males, Nursing and they are in agreement.  CODE STATUS: FULL CODE, Palliative care c/s  TOTAL TIME TAKING CARE OF THIS PATIENT: 35 minutes.   More than 50% of the time was spent in counseling/coordination of care: YES  POSSIBLE D/C IN 2-3 DAYS, DEPENDING ON CLINICAL CONDITION.   Max Sane M.D on 10/29/2016 at 1:48 PM  Between 7am to 6pm - Pager - [phone]  After 6pm go to [email or web address] - Proofreader  [facility]  Office  [phone]  CC: Primary care physician; Madelyn Brunner, MD  Note: This dictation was prepared with Dragon dictation along with smaller phrase technology. Any transcriptional errors that result from this process are unintentional.

## 2016-10-29 NOTE — Progress Notes (Signed)
10/29/2016  Subjective: Patient started on clears and advanced to full liquid this morning.  Tolerating diet well.  Improving abdominal pain.  Vital signs: Temp:  [98.1 F (36.7 C)-98.9 F (37.2 C)] 98.3 F (36.8 C) (01/18 0757) Pulse Rate:  [75-164] 164 (01/18 0757) Resp:  [16-18] 16 (01/18 0757) BP: (112-126)/(61-62) 121/62 (01/18 0757) SpO2:  [97 %-99 %] 98 % (01/18 0757) Weight:  [83 kg (183 lb)] 83 kg (183 lb) (01/18 0453)   Intake/Output: 01/17 0701 - 01/18 0700 In: 535 [P.O.:120; I.V.:258; IV Piggyback:157] Out: -  Last BM Date: 10/28/16  Physical Exam: Constitutional: no acute distress Abdomen:  Soft, nondistended, minimal soreness to palpation over lower abdomen and left side.  Labs:   Recent Labs  10/28/16 0756 10/29/16 0155  WBC 9.0 7.5  HGB 9.1* 9.6*  HCT 26.4* 27.3*  PLT 166 177    Recent Labs  10/28/16 0756 10/29/16 0155  NA 137 139  K 2.6* 2.7*  CL 110 111  CO2 22 21*  GLUCOSE 137* 93  BUN 12 9  CREATININE 0.88 0.64  CALCIUM 6.7* 7.2*    Recent Labs  10/26/16 1811  LABPROT 14.4  INR 1.11    Imaging: No results found.  Assessment/Plan: 71 yo male with ulcerative colitis and IMV thrombosis, E coli and enterobacter bacteremia.  --continue iv antibiotics.   --continue anticoagulation --agree with GI regarding referral to [facility] for further management of the patients ulcerative colitis.  At this point he is not in any urgent need for operation but given his bacteremia, there is concern he may have bacteria in the clot as well which could require surgical management if blood cultures do not clear up.  He could be transferred to [facility] if needs any surgery.   Melvyn Neth, [place]

## 2016-10-29 NOTE — Progress Notes (Signed)
MEDICATION RELATED CONSULT NOTE - INITIAL   Pharmacy Consult for electrolyte monitoring and replacement Indication: hypokalemia/VTach  Allergies  Allergen Reactions  . Codeine Nausea And Vomiting and Other (See Comments)    Patient Measurements: Height: 6' (182.9 cm) Weight: 183 lb (83 kg) IBW/kg (Calculated) : 77.6 Adjusted Body Weight:   Vital Signs: Temp: 98.3 F (36.8 C) (01/18 0757) Temp Source: Oral (01/18 0757) BP: 121/62 (01/18 0757) Pulse Rate: 164 (01/18 0757) Intake/Output from previous day: 01/17 0701 - 01/18 0700 In: 535 [P.O.:120; I.V.:258; IV Piggyback:157] Out: -  Intake/Output from this shift: Total I/O In: 480 [P.O.:480] Out: -   Labs:  Recent Labs  10/26/16 1811 10/27/16 0143 10/27/16 1033 10/28/16 0756 10/29/16 0155 10/29/16 1404  WBC 11.9*  --  10.7* 9.0 7.5  --   HGB 10.9*  --  10.0* 9.1* 9.6*  --   HCT 32.0*  --  28.3* 26.4* 27.3*  --   PLT 266  --  164 166 177  --   APTT  --  36  --   --   --   --   CREATININE 1.12  --   --  0.88 0.64 0.74  MG  --   --   --  1.6* 1.7 2.1  PHOS  --   --   --   --  1.6* 1.9*  ALBUMIN 2.3*  --   --   --  1.7*  --   PROT 5.9*  --   --   --   --   --   AST 25  --   --   --   --   --   ALT 15*  --   --   --   --   --   ALKPHOS 121  --   --   --   --   --   BILITOT 0.9  --   --   --   --   --    Estimated Creatinine Clearance: 94.3 mL/min (by C-G formula based on SCr of 0.74 mg/dL).   Microbiology: Recent Results (from the past 720 hour(s))  Gastrointestinal Panel by PCR , Stool     Status: None   Collection Time: 10/06/16  7:30 PM  Result Value Ref Range Status   Campylobacter species NOT DETECTED NOT DETECTED Final   Plesimonas shigelloides NOT DETECTED NOT DETECTED Final   Salmonella species NOT DETECTED NOT DETECTED Final   Yersinia enterocolitica NOT DETECTED NOT DETECTED Final   Vibrio species NOT DETECTED NOT DETECTED Final   Vibrio cholerae NOT DETECTED NOT DETECTED Final   Enteroaggregative E coli (EAEC) NOT DETECTED NOT DETECTED Final   Enteropathogenic E coli (EPEC) NOT DETECTED NOT DETECTED Final   Enterotoxigenic E coli (ETEC) NOT DETECTED NOT DETECTED Final   Shiga like toxin producing E coli (STEC) NOT DETECTED NOT DETECTED Final   Shigella/Enteroinvasive E coli (EIEC) NOT DETECTED NOT DETECTED Final   Cryptosporidium NOT DETECTED NOT DETECTED Final   Cyclospora cayetanensis NOT DETECTED NOT DETECTED Final   Entamoeba histolytica NOT DETECTED NOT DETECTED Final   Giardia lamblia NOT DETECTED NOT DETECTED Final   Adenovirus F40/41 NOT DETECTED NOT DETECTED Final   Astrovirus NOT DETECTED NOT DETECTED Final   Norovirus GI/GII NOT DETECTED NOT DETECTED Final   Rotavirus A NOT DETECTED NOT DETECTED Final   Sapovirus (I, II, IV, and V) NOT DETECTED NOT DETECTED Final  C difficile quick scan w PCR reflex  Status: None   Collection Time: 10/06/16  7:30 PM  Result Value Ref Range Status   C Diff antigen NEGATIVE NEGATIVE Final   C Diff toxin NEGATIVE NEGATIVE Final   C Diff interpretation No C. difficile detected.  Final  Culture, blood (Routine x 2)     Status: Abnormal (Preliminary result)   Collection Time: 10/26/16  6:12 PM  Result Value Ref Range Status   Specimen Description BLOOD RIGHT FA  Final   Special Requests   Final    BOTTLES DRAWN AEROBIC AND ANAEROBIC AER 16ML ANA 16ML   Culture  Setup Time   Final    GRAM NEGATIVE RODS ANAEROBIC BOTTLE ONLY CRITICAL RESULT CALLED TO, READ BACK BY AND VERIFIED WITH: [facility] HALLAJI 10/27/16 @ [address] [place] Performed at [facility], [place] [address], [place], [place] [address]    Culture ESCHERICHIA COLI (A)  Final   Report Status PENDING  Incomplete  Culture, blood (Routine x 2)     Status: None (Preliminary result)   Collection Time: 10/26/16  6:12 PM  Result Value Ref Range Status   Specimen Description BLOOD LEFT FA  Final   Special Requests   Final    BOTTLES DRAWN AEROBIC AND ANAEROBIC AER  11ML ANA 10ML   Culture NO GROWTH 3 DAYS  Final   Report Status PENDING  Incomplete  Blood Culture ID Panel (Reflexed)     Status: Abnormal   Collection Time: 10/26/16  6:12 PM  Result Value Ref Range Status   Enterococcus species NOT DETECTED NOT DETECTED Final   Listeria monocytogenes NOT DETECTED NOT DETECTED Final   Staphylococcus species NOT DETECTED NOT DETECTED Final   Staphylococcus aureus NOT DETECTED NOT DETECTED Final   Streptococcus species NOT DETECTED NOT DETECTED Final   Streptococcus agalactiae NOT DETECTED NOT DETECTED Final   Streptococcus pneumoniae NOT DETECTED NOT DETECTED Final   Streptococcus pyogenes NOT DETECTED NOT DETECTED Final   Acinetobacter baumannii NOT DETECTED NOT DETECTED Final   Enterobacteriaceae species DETECTED (A) NOT DETECTED Final    Comment: CRITICAL RESULT CALLED TO, READ BACK BY AND VERIFIED WITH: [name] HALLAJI 10/27/16 2220 [place]    Enterobacter cloacae complex NOT DETECTED NOT DETECTED Final   Escherichia coli DETECTED (A) NOT DETECTED Final    Comment: CRITICAL RESULT CALLED TO, READ BACK BY AND VERIFIED WITH: [name] HALLAJI 10/27/16 2220 [place]    Klebsiella oxytoca NOT DETECTED NOT DETECTED Final   Klebsiella pneumoniae NOT DETECTED NOT DETECTED Final   Proteus species NOT DETECTED NOT DETECTED Final   Serratia marcescens NOT DETECTED NOT DETECTED Final   Carbapenem resistance NOT DETECTED NOT DETECTED Final   Haemophilus influenzae NOT DETECTED NOT DETECTED Final   Neisseria meningitidis NOT DETECTED NOT DETECTED Final   Pseudomonas aeruginosa NOT DETECTED NOT DETECTED Final   Candida albicans NOT DETECTED NOT DETECTED Final   Candida glabrata NOT DETECTED NOT DETECTED Final   Candida krusei NOT DETECTED NOT DETECTED Final   Candida parapsilosis NOT DETECTED NOT DETECTED Final   Candida tropicalis NOT DETECTED NOT DETECTED Final  Urine culture     Status: Abnormal   Collection Time: 10/26/16  8:30 PM  Result Value Ref Range Status    Specimen Description URINE, RANDOM  Final   Special Requests NONE  Final   Culture (A)  Final    <10,000 COLONIES/mL INSIGNIFICANT GROWTH Performed at [facility], [address] [place], [place] [address]    Report Status 10/28/2016 FINAL  Final  C difficile quick scan w PCR reflex     Status: None   Collection Time: 10/27/16  4:58 PM  Result Value Ref Range Status   C Diff antigen NEGATIVE NEGATIVE Final   C Diff toxin NEGATIVE NEGATIVE Final   C Diff interpretation No C. difficile detected.  Final  Gastrointestinal Panel by PCR , Stool     Status: None   Collection Time: 10/27/16  4:58 PM  Result Value Ref Range Status   Campylobacter species NOT DETECTED NOT DETECTED Final   Plesimonas shigelloides NOT DETECTED NOT DETECTED Final   Salmonella species NOT DETECTED NOT DETECTED Final   Yersinia enterocolitica NOT DETECTED NOT DETECTED Final   Vibrio species NOT DETECTED NOT DETECTED Final   Vibrio cholerae NOT DETECTED NOT DETECTED Final   Enteroaggregative E coli (EAEC) NOT DETECTED NOT DETECTED Final   Enteropathogenic E coli (EPEC) NOT DETECTED NOT DETECTED Final   Enterotoxigenic E coli (ETEC) NOT DETECTED NOT DETECTED Final   Shiga like toxin producing E coli (STEC) NOT DETECTED NOT DETECTED Final   Shigella/Enteroinvasive E coli (EIEC) NOT DETECTED NOT DETECTED Final   Cryptosporidium NOT DETECTED NOT DETECTED Final   Cyclospora cayetanensis NOT DETECTED NOT DETECTED Final   Entamoeba histolytica NOT DETECTED NOT DETECTED Final   Giardia lamblia NOT DETECTED NOT DETECTED Final   Adenovirus F40/41 NOT DETECTED NOT DETECTED Final   Astrovirus NOT DETECTED NOT DETECTED Final   Norovirus GI/GII NOT DETECTED NOT DETECTED Final   Rotavirus A NOT DETECTED NOT DETECTED Final   Sapovirus (I, II, IV, and V) NOT DETECTED NOT DETECTED Final    Medical History: Past Medical History:  Diagnosis Date  . Anemia   . CAD (coronary artery disease) 07/08/2011   Overview:  a.  07/2006: Anterior ST elevation MI. b. 07/2006: PCI with BMS to LAD and RCA. c. 09/2006: Non ST elevation. d. LVEF 30%.  Discussed ICD, not currently interested.   . 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 Dyslipidemia 07/30/2011   Overview:  High triglycerides  . GERD (gastroesophageal reflux disease) 07/08/2011  . History of Clostridium difficile colitis   . History of myocardial infarction   . Hyperlipidemia, unspecified 07/08/2011  . Hypothyroidism   . 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 Type II diabetes mellitus, uncontrolled ([place]) 07/08/2011  . Vitamin D deficiency     Medications:  Infusions:  . heparin 1,000 Units/hr (10/28/16 2000)    Assessment: 19 yom admitted for chills with worsening diarrhea. Fever on admission, ID started meropenem. GI following for UC and diarrhea. Tonight patient had VTach and low electrolytes. K 2.7, Mg 1.7, phos 1.6, Ca 7.2 corrected to 8.5.   Goal of Therapy:  Electrolytes WNL  Plan:  K=3.1, Mg=2.1, phos=1.9, corrected ca=9.1  Will give KCL IV 30 MEQ once followed by 79mol of potassium phosphate (30 MEQ of KCL). Recheck in the AM.  MRamond Dial Pharm.D., BCPS Clinical pharmacist 10/29/2016,3:14 PM

## 2016-10-29 NOTE — Progress Notes (Signed)
Dr. Benjie Karvonen notified of K+ 2.7; acknowledged; "Place pharmacy consult to dose electrolytes";  new order written. Barbaraann Faster, RN 3:30 AM 10/29/2016

## 2016-10-29 NOTE — Progress Notes (Signed)
9 beat Vtach, up to [facility]; brick-red, incontinent stool Barbaraann Faster, RN 10/29/2016 12:41 AM

## 2016-10-29 NOTE — Progress Notes (Signed)
38: On-call Hosptialist paged, awaiting call-back for Vtach, and red diarrhea; Barbaraann Faster, RN 12:59 AM 10/29/2016

## 2016-10-29 NOTE — Progress Notes (Signed)
Dr. Benjie Karvonen notified of red diarrhea and 9-beat run of Vtach; acknowledged; new orders written. Barbaraann Faster, RN 1:08 AM 10/29/2016

## 2016-10-30 DIAGNOSIS — K551 Chronic vascular disorders of intestine: Secondary | ICD-10-CM

## 2016-10-30 DIAGNOSIS — I2510 Atherosclerotic heart disease of native coronary artery without angina pectoris: Secondary | ICD-10-CM

## 2016-10-30 DIAGNOSIS — I10 Essential (primary) hypertension: Secondary | ICD-10-CM

## 2016-10-30 DIAGNOSIS — R509 Fever, unspecified: Secondary | ICD-10-CM

## 2016-10-30 LAB — GLUCOSE, CAPILLARY
Glucose-Capillary: 142 mg/dL — ABNORMAL HIGH (ref 65–99)
Glucose-Capillary: 226 mg/dL — ABNORMAL HIGH (ref 65–99)
Glucose-Capillary: 181 mg/dL — ABNORMAL HIGH (ref 65–99)
Glucose-Capillary: 192 mg/dL — ABNORMAL HIGH (ref 65–99)

## 2016-10-30 LAB — CULTURE, BLOOD (ROUTINE X 2)

## 2016-10-30 LAB — CBC
HEMATOCRIT: 27.5 % — AB (ref 40.0–52.0)
Hemoglobin: 9.5 g/dL — ABNORMAL LOW (ref 13.0–18.0)
MCH: 30.7 pg (ref 26.0–34.0)
MCHC: 34.6 g/dL (ref 32.0–36.0)
MCV: 88.8 fL (ref 80.0–100.0)
Platelets: 211 10*3/uL (ref 150–440)
RBC: 3.1 MIL/uL — ABNORMAL LOW (ref 4.40–5.90)
RDW: 14.8 % — AB (ref 11.5–14.5)
WBC: 6.4 10*3/uL (ref 3.8–10.6)

## 2016-10-30 LAB — BASIC METABOLIC PANEL
Anion gap: 5 (ref 5–15)
BUN: 5 mg/dL — ABNORMAL LOW (ref 6–20)
CALCIUM: 7.4 mg/dL — AB (ref 8.9–10.3)
CO2: 22 mmol/L (ref 22–32)
CREATININE: 0.76 mg/dL (ref 0.61–1.24)
Chloride: 111 mmol/L (ref 101–111)
GFR calc non Af Amer: >60 mL/min (ref >60)
Glucose, Bld: 143 mg/dL — ABNORMAL HIGH (ref 65–99)
Potassium: 3.4 mmol/L — ABNORMAL LOW (ref 3.5–5.1)
SODIUM: 138 mmol/L (ref 135–145)

## 2016-10-30 LAB — PHOSPHORUS: PHOSPHORUS: 2.2 mg/dL — AB (ref 2.5–4.6)

## 2016-10-30 LAB — HEPARIN LEVEL (UNFRACTIONATED): HEPARIN UNFRACTIONATED: 0.4 [IU]/mL (ref 0.30–0.70)

## 2016-10-30 LAB — MAGNESIUM: MAGNESIUM: 2 mg/dL (ref 1.7–2.4)

## 2016-10-30 LAB — C-REACTIVE PROTEIN: CRP: 5.2 mg/dL — ABNORMAL HIGH (ref <1.0)

## 2016-10-30 MED ORDER — INSULIN ASPART 100 UNIT/ML ~~LOC~~ SOLN
0.0000 [IU] | Freq: Three times a day (TID) | SUBCUTANEOUS | Status: DC
  Administered 2016-10-30: 3 [IU] via SUBCUTANEOUS
  Administered 2016-10-30: 2 [IU] via SUBCUTANEOUS
  Administered 2016-10-31: 5 [IU] via SUBCUTANEOUS
  Administered 2016-10-31: 2 [IU] via SUBCUTANEOUS
  Administered 2016-10-31: 3 [IU] via SUBCUTANEOUS
  Administered 2016-11-01: 5 [IU] via SUBCUTANEOUS
  Administered 2016-11-01: 3 [IU] via SUBCUTANEOUS
  Administered 2016-11-01: 2 [IU] via SUBCUTANEOUS
  Administered 2016-11-02: 1 [IU] via SUBCUTANEOUS
  Administered 2016-11-02: 3 [IU] via SUBCUTANEOUS
  Filled 2016-10-30: qty 5
  Filled 2016-10-30 (×3): qty 3
  Filled 2016-10-30: qty 2
  Filled 2016-10-30: qty 3
  Filled 2016-10-30: qty 1
  Filled 2016-10-30 (×2): qty 2
  Filled 2016-10-30: qty 5

## 2016-10-30 MED ORDER — ENSURE ENLIVE PO LIQD
237.0000 mL | Freq: Three times a day (TID) | ORAL | Status: DC
  Administered 2016-10-30 – 2016-11-03 (×10): 237 mL via ORAL

## 2016-10-30 MED ORDER — BUDESONIDE 3 MG PO CPEP
6.0000 mg | ORAL_CAPSULE | Freq: Every day | ORAL | Status: DC
  Administered 2016-10-30 – 2016-10-31 (×2): 6 mg via ORAL
  Filled 2016-10-30 (×2): qty 2

## 2016-10-30 MED ORDER — METRONIDAZOLE 500 MG PO TABS
500.0000 mg | ORAL_TABLET | Freq: Two times a day (BID) | ORAL | Status: DC
  Administered 2016-10-30 – 2016-11-04 (×11): 500 mg via ORAL
  Filled 2016-10-30 (×12): qty 1

## 2016-10-30 MED ORDER — ZOLPIDEM TARTRATE 5 MG PO TABS
5.0000 mg | ORAL_TABLET | Freq: Every evening | ORAL | Status: DC | PRN
  Administered 2016-10-30 – 2016-11-03 (×5): 5 mg via ORAL
  Filled 2016-10-30 (×6): qty 1

## 2016-10-30 MED ORDER — CIPROFLOXACIN HCL 500 MG PO TABS
500.0000 mg | ORAL_TABLET | Freq: Two times a day (BID) | ORAL | Status: DC
  Administered 2016-10-30 – 2016-11-04 (×10): 500 mg via ORAL
  Filled 2016-10-30 (×12): qty 1

## 2016-10-30 MED ORDER — POTASSIUM PHOSPHATES 15 MMOLE/5ML IV SOLN
30.0000 mmol | Freq: Once | INTRAVENOUS | Status: AC
  Administered 2016-10-30: 30 mmol via INTRAVENOUS
  Filled 2016-10-30: qty 10

## 2016-10-30 NOTE — Progress Notes (Addendum)
[name] MD [address], [place], [place] [address] Phone: [phone] Fax : [phone]  GREIG ALTERGOTT is being followed for ulcerative pan colitis, SMV clot and bacteremia from ecoli  Subjective: Some blood in stool , two watery bowel movements yesterday and one today .   Objective: Vital signs in last 24 hours: Vitals:   10/30/16 0500 10/30/16 0724 10/30/16 0846 10/30/16 1003  BP:  119/64 (!) 112/51 (!) 100/48  Pulse:  75 84   Resp:  17    Temp:  98 F (36.7 C)    TempSrc:  Oral    SpO2:  97%    Weight: 184 lb 9.6 oz (83.7 kg)     Height:       Weight change: 1 lb 9.6 oz (0.726 kg)  Intake/Output Summary (Last 24 hours) at 10/30/16 1200 Last data filed at 10/30/16 1015  Gross per 24 hour  Intake          1617.15 ml  Output              500 ml  Net          1117.15 ml     Exam: Heart:: Regular rate and rhythm, S1S2 present or without murmur or extra heart sounds Lungs: normal and clear to auscultation Abdomen: soft, nontender, normal bowel sounds   Lab Results: @LABTEST2 @ Micro Results: Recent Results (from the past 240 hour(s))  Culture, blood (Routine x 2)     Status: Abnormal   Collection Time: 10/26/16  6:12 PM  Result Value Ref Range Status   Specimen Description BLOOD RIGHT FA  Final   Special Requests   Final    BOTTLES DRAWN AEROBIC AND ANAEROBIC AER 16ML ANA 16ML   Culture  Setup Time   Final    GRAM NEGATIVE RODS ANAEROBIC BOTTLE ONLY CRITICAL RESULT CALLED TO, READ BACK BY AND VERIFIED WITH: [facility] HALLAJI 10/27/16 @ 22[address] [place] Performed at [facility], [address] [place], [place] [address]    Culture ESCHERICHIA COLI (A)  Final   Report Status 10/30/2016 FINAL  Final   Organism ID, Bacteria ESCHERICHIA COLI  Final      Susceptibility   Escherichia coli - MIC*    AMPICILLIN <=2 SENSITIVE Sensitive     CEFAZOLIN <=4 SENSITIVE Sensitive     CEFEPIME <=1 SENSITIVE Sensitive     CEFTAZIDIME <=1 SENSITIVE  Sensitive     CEFTRIAXONE <=1 SENSITIVE Sensitive     CIPROFLOXACIN <=0.25 SENSITIVE Sensitive     GENTAMICIN <=1 SENSITIVE Sensitive     IMIPENEM <=0.25 SENSITIVE Sensitive     TRIMETH/SULFA <=20 SENSITIVE Sensitive     AMPICILLIN/SULBACTAM <=2 SENSITIVE Sensitive     PIP/TAZO <=4 SENSITIVE Sensitive     Extended ESBL NEGATIVE Sensitive     * ESCHERICHIA COLI  Culture, blood (Routine x 2)     Status: None (Preliminary result)   Collection Time: 10/26/16  6:12 PM  Result Value Ref Range Status   Specimen Description BLOOD LEFT FA  Final   Special Requests   Final    BOTTLES DRAWN AEROBIC AND ANAEROBIC AER 11ML ANA 10ML   Culture NO GROWTH 4 DAYS  Final   Report Status PENDING  Incomplete  Blood Culture ID Panel (Reflexed)     Status: Abnormal   Collection Time: 10/26/16  6:12 PM  Result Value Ref Range Status   Enterococcus species NOT DETECTED NOT DETECTED Final   Listeria monocytogenes NOT DETECTED  NOT DETECTED Final   Staphylococcus species NOT DETECTED NOT DETECTED Final   Staphylococcus aureus NOT DETECTED NOT DETECTED Final   Streptococcus species NOT DETECTED NOT DETECTED Final   Streptococcus agalactiae NOT DETECTED NOT DETECTED Final   Streptococcus pneumoniae NOT DETECTED NOT DETECTED Final   Streptococcus pyogenes NOT DETECTED NOT DETECTED Final   Acinetobacter baumannii NOT DETECTED NOT DETECTED Final   Enterobacteriaceae species DETECTED (A) NOT DETECTED Final    Comment: CRITICAL RESULT CALLED TO, READ BACK BY AND VERIFIED WITH: [name] HALLAJI 10/27/16 2220 [place]    Enterobacter cloacae complex NOT DETECTED NOT DETECTED Final   Escherichia coli DETECTED (A) NOT DETECTED Final    Comment: CRITICAL RESULT CALLED TO, READ BACK BY AND VERIFIED WITH: [name] HALLAJI 10/27/16 2220 [place]    Klebsiella oxytoca NOT DETECTED NOT DETECTED Final   Klebsiella pneumoniae NOT DETECTED NOT DETECTED Final   Proteus species NOT DETECTED NOT DETECTED Final   Serratia marcescens NOT  DETECTED NOT DETECTED Final   Carbapenem resistance NOT DETECTED NOT DETECTED Final   Haemophilus influenzae NOT DETECTED NOT DETECTED Final   Neisseria meningitidis NOT DETECTED NOT DETECTED Final   Pseudomonas aeruginosa NOT DETECTED NOT DETECTED Final   Candida albicans NOT DETECTED NOT DETECTED Final   Candida glabrata NOT DETECTED NOT DETECTED Final   Candida krusei NOT DETECTED NOT DETECTED Final   Candida parapsilosis NOT DETECTED NOT DETECTED Final   Candida tropicalis NOT DETECTED NOT DETECTED Final  Urine culture     Status: Abnormal   Collection Time: 10/26/16  8:30 PM  Result Value Ref Range Status   Specimen Description URINE, RANDOM  Final   Special Requests NONE  Final   Culture (A)  Final    <10,000 COLONIES/mL INSIGNIFICANT GROWTH Performed at [facility], [place] [address], [place], [place] [address]    Report Status 10/28/2016 FINAL  Final  C difficile quick scan w PCR reflex     Status: None   Collection Time: 10/27/16  4:58 PM  Result Value Ref Range Status   C Diff antigen NEGATIVE NEGATIVE Final   C Diff toxin NEGATIVE NEGATIVE Final   C Diff interpretation No C. difficile detected.  Final  Gastrointestinal Panel by PCR , Stool     Status: None   Collection Time: 10/27/16  4:58 PM  Result Value Ref Range Status   Campylobacter species NOT DETECTED NOT DETECTED Final   Plesimonas shigelloides NOT DETECTED NOT DETECTED Final   Salmonella species NOT DETECTED NOT DETECTED Final   Yersinia enterocolitica NOT DETECTED NOT DETECTED Final   Vibrio species NOT DETECTED NOT DETECTED Final   Vibrio cholerae NOT DETECTED NOT DETECTED Final   Enteroaggregative E coli (EAEC) NOT DETECTED NOT DETECTED Final   Enteropathogenic E coli (EPEC) NOT DETECTED NOT DETECTED Final   Enterotoxigenic E coli (ETEC) NOT DETECTED NOT DETECTED Final   Shiga like toxin producing E coli (STEC) NOT DETECTED NOT DETECTED Final   Shigella/Enteroinvasive E coli (EIEC) NOT DETECTED NOT  DETECTED Final   Cryptosporidium NOT DETECTED NOT DETECTED Final   Cyclospora cayetanensis NOT DETECTED NOT DETECTED Final   Entamoeba histolytica NOT DETECTED NOT DETECTED Final   Giardia lamblia NOT DETECTED NOT DETECTED Final   Adenovirus F40/41 NOT DETECTED NOT DETECTED Final   Astrovirus NOT DETECTED NOT DETECTED Final   Norovirus GI/GII NOT DETECTED NOT DETECTED Final   Rotavirus A NOT DETECTED NOT DETECTED Final   Sapovirus (I, II, IV, and V) NOT DETECTED  NOT DETECTED Final   Studies/Results: No results found. Medications: I have reviewed the patient's current medications. Scheduled Meds: . aspirin EC  81 mg Oral BID  . atorvastatin  40 mg Oral Daily  . carvedilol  3.125 mg Oral BID WC  . cholecalciferol  2,000 Units Oral Daily  . feeding supplement (ENSURE ENLIVE)  237 mL Oral TID BM  . insulin aspart  0-9 Units Subcutaneous TID WC  . levothyroxine  50 mcg Oral QAC breakfast  . meropenem  1 g Intravenous Q8H  . pantoprazole  40 mg Oral QAC breakfast  . potassium phosphate IVPB (mmol)  30 mmol Intravenous Once  . ramipril  5 mg Oral Daily  . sodium chloride flush  3 mL Intravenous Q12H   Continuous Infusions: . heparin 1,150 Units/hr (10/29/16 2242)   PRN Meds:.acetaminophen **OR** acetaminophen, ondansetron **OR** ondansetron (ZOFRAN) IV, zolpidem  BP (!) 100/48   Pulse 84   Temp 98 F (36.7 C) (Oral)   Resp 17   Ht 6' (1.829 m)   Wt 184 lb 9.6 oz (83.7 kg)   SpO2 97%   BMI 25.04 kg/m   Assessment: Principal Problem:   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CC) Active Problems:   Ulcerative colitis (HCC)   Sepsis (HCC)   Fever   Elevated lactic acid level  Ulcerative colitis been on high dose steroids with decrease in bleeding but persistence of inflammation on imaging. Admitted with a clot in the SMV, complicated with ecoli bacteremia , per ID suspicious for ESBL on meropenem. Presently his colitis seems to be mild. It is very likely that the clot was  secondary to a hypercoagulative state from active colitis, it is also very likely his clot was infected . Long discussion with him that he is at a very high change for developing severe colitis as he is not on any treatment and the very few options available at this time, Weighing the risks of no treatment vs commencing on steroids- he is willing to proceed with commencing on steroids. He is aware of risk of infection and sepsis.   Plan:  1. Commence on Entocort 6 mg once daily.   If develops any features of SIRS/sepsis- d/c   He would need further evaluation for his colitis at a tertiary center with colorectal surgery for further managament, patient not keen on transfer but not much to offer from our end at this time . Presently not a candidate for biologic agents.    LOS: 3 days   [name] 10/30/2016, 12:00 PM

## 2016-10-30 NOTE — Progress Notes (Signed)
Palliative Medicine consult noted. Due to high referral volume, there may be a delay seeing this patient. Please call the Palliative Medicine Team office at [phone] if recommendations are needed in the interim.  Thank you for inviting [place] to see this patient.  Marjie Skiff Aikam Hellickson, RN, BSN, [facility] 10/30/2016 10:58 AM Cell [phone] 8:00-4:00 Monday-Friday Office [phone]

## 2016-10-30 NOTE — Progress Notes (Signed)
71 yr old male with history of ulcerative colitis who was improving from flair.  He stated that he had sinus infection and took one antibiotic on MOnday and then got Rigors.  He had CT scan showing improving colitis but with a IMV thrombus and bacteremia.   Patient having diarrhea but non bloody.    Vitals:   10/30/16 1218 10/30/16 1725  BP: 105/61 118/67  Pulse: 85 79  Resp: 18   Temp: 98.1 F (36.7 C)    I/O last 3 completed shifts: In: 1882.2 [P.O.:1320; I.V.:355.2; IV Piggyback:207] Out: 400 [Urine:400] Total I/O In: 410 [P.O.:240; I.V.:63; IV Piggyback:107] Out: 100 [Urine:100]   PE:  Gen: NAD Res: CTAB/L  Cardio: RRR Abd: soft, nontender, non distended Ext: No edema  CBC Latest Ref Rng & Units 10/30/2016 10/29/2016 10/28/2016  WBC 3.8 - 10.6 K/uL 6.4 7.5 9.0  Hemoglobin 13.0 - 18.0 g/dL 9.5(L) 9.6(L) 9.1(L)  Hematocrit 40.0 - 52.0 % 27.5(L) 27.3(L) 26.4(L)  Platelets 150 - 440 K/uL 211 177 166   CMP Latest Ref Rng & Units 10/30/2016 10/29/2016 10/29/2016  Glucose 65 - 99 mg/dL 143(H) 163(H) 93  BUN 6 - 20 mg/dL 5(L) 7 9  Creatinine 0.61 - 1.24 mg/dL 0.76 0.74 0.64  Sodium 135 - 145 mmol/L 138 137 139  Potassium 3.5 - 5.1 mmol/L 3.4(L) 3.1(L) 2.7(LL)  Chloride 101 - 111 mmol/L 111 110 111  CO2 22 - 32 mmol/L 22 21(L) 21(L)  Calcium 8.9 - 10.3 mg/dL 7.4(L) 7.3(L) 7.2(L)  Total Protein 6.5 - 8.1 g/dL - - -  Total Bilirubin 0.3 - 1.2 mg/dL - - -  Alkaline Phos 38 - 126 U/L - - -  AST 15 - 41 U/L - - -  ALT 17 - 63 U/L - - -    A/P:  71 yr old male with history of ulcerative colitis with IMV thrombus with bacteremia   I have had an extensive discussion with him that due to the fact that he had this flair again while on Prednisone that he likely needs his colon removed and should be done by colorectal surgeon.  I have discussed with Dr. Vicente Males as well and we have both stressed to the patient that he could become very sick, septic, and potential need emergent operation or  even die if he were to significantly worsen and that he is not a candidate for Remicaid or steroids now given his bacteremia.  He expressed understanding but does not wish to have an operation at this time or be transferred to a center with colorectal surgery.    He states that he wants to see the [facility] doctor that saw him 10 yrs prior and is under the impression that they would likely place him on different medications.   He expressed understanding with my concerns for him to worsen and my recommendation that he have his colon out and has declined.    If he tolerates regular diet and can keep up from a fluid standpoint likely ok to discharge home. I spoke with Dr. Delana Meyer of vascular surgery as well and he will need to transition to Coumadin from the heparin gtt.

## 2016-10-30 NOTE — Progress Notes (Signed)
MEDICATION RELATED CONSULT NOTE - INITIAL   Pharmacy Consult for electrolyte monitoring and replacement Indication: hypokalemia/VTach  Allergies  Allergen Reactions  . Codeine Nausea And Vomiting and Other (See Comments)    Patient Measurements: Height: 6' (182.9 cm) Weight: 184 lb 9.6 oz (83.7 kg) IBW/kg (Calculated) : 77.6 Adjusted Body Weight:   Vital Signs: Temp: 98 F (36.7 C) (01/19 0724) Temp Source: Oral (01/19 0724) BP: 112/51 (01/19 0846) Pulse Rate: 84 (01/19 0846) Intake/Output from previous day: 01/18 0701 - 01/19 0700 In: 1614.2 [P.O.:1320; I.V.:244.2; IV Piggyback:50] Out: 400 [Urine:400] Intake/Output from this shift: Total I/O In: 3 [I.V.:3] Out: 100 [Urine:100]  Labs:  Recent Labs  10/28/16 0756 10/29/16 0155 10/29/16 1404 10/30/16 0819  WBC 9.0 7.5  --  6.4  HGB 9.1* 9.6*  --  9.5*  HCT 26.4* 27.3*  --  27.5*  PLT 166 177  --  211  CREATININE 0.88 0.64 0.74 0.76  MG 1.6* 1.7 2.1 2.0  PHOS  --  1.6* 1.9* 2.2*  ALBUMIN  --  1.7*  --   --    Estimated Creatinine Clearance: 94.3 mL/min (by C-G formula based on SCr of 0.76 mg/dL).   Microbiology: Recent Results (from the past 720 hour(s))  Gastrointestinal Panel by PCR , Stool     Status: None   Collection Time: 10/06/16  7:30 PM  Result Value Ref Range Status   Campylobacter species NOT DETECTED NOT DETECTED Final   Plesimonas shigelloides NOT DETECTED NOT DETECTED Final   Salmonella species NOT DETECTED NOT DETECTED Final   Yersinia enterocolitica NOT DETECTED NOT DETECTED Final   Vibrio species NOT DETECTED NOT DETECTED Final   Vibrio cholerae NOT DETECTED NOT DETECTED Final   Enteroaggregative E coli (EAEC) NOT DETECTED NOT DETECTED Final   Enteropathogenic E coli (EPEC) NOT DETECTED NOT DETECTED Final   Enterotoxigenic E coli (ETEC) NOT DETECTED NOT DETECTED Final   Shiga like toxin producing E coli (STEC) NOT DETECTED NOT DETECTED Final   Shigella/Enteroinvasive E coli (EIEC) NOT  DETECTED NOT DETECTED Final   Cryptosporidium NOT DETECTED NOT DETECTED Final   Cyclospora cayetanensis NOT DETECTED NOT DETECTED Final   Entamoeba histolytica NOT DETECTED NOT DETECTED Final   Giardia lamblia NOT DETECTED NOT DETECTED Final   Adenovirus F40/41 NOT DETECTED NOT DETECTED Final   Astrovirus NOT DETECTED NOT DETECTED Final   Norovirus GI/GII NOT DETECTED NOT DETECTED Final   Rotavirus A NOT DETECTED NOT DETECTED Final   Sapovirus (I, II, IV, and V) NOT DETECTED NOT DETECTED Final  C difficile quick scan w PCR reflex     Status: None   Collection Time: 10/06/16  7:30 PM  Result Value Ref Range Status   C Diff antigen NEGATIVE NEGATIVE Final   C Diff toxin NEGATIVE NEGATIVE Final   C Diff interpretation No C. difficile detected.  Final  Culture, blood (Routine x 2)     Status: Abnormal (Preliminary result)   Collection Time: 10/26/16  6:12 PM  Result Value Ref Range Status   Specimen Description BLOOD RIGHT FA  Final   Special Requests   Final    BOTTLES DRAWN AEROBIC AND ANAEROBIC AER 16ML ANA 16ML   Culture  Setup Time   Final    GRAM NEGATIVE RODS ANAEROBIC BOTTLE ONLY CRITICAL RESULT CALLED TO, READ BACK BY AND VERIFIED WITH: [facility] HALLAJI 10/27/16 @ [address] [place] Performed at [facility], [place] [address] [place], [place] [address]    Culture ESCHERICHIA  COLI (A)  Final   Report Status PENDING  Incomplete  Culture, blood (Routine x 2)     Status: None (Preliminary result)   Collection Time: 10/26/16  6:12 PM  Result Value Ref Range Status   Specimen Description BLOOD LEFT FA  Final   Special Requests   Final    BOTTLES DRAWN AEROBIC AND ANAEROBIC AER 11ML ANA 10ML   Culture NO GROWTH 4 DAYS  Final   Report Status PENDING  Incomplete  Blood Culture ID Panel (Reflexed)     Status: Abnormal   Collection Time: 10/26/16  6:12 PM  Result Value Ref Range Status   Enterococcus species NOT DETECTED NOT DETECTED Final   Listeria monocytogenes NOT DETECTED NOT  DETECTED Final   Staphylococcus species NOT DETECTED NOT DETECTED Final   Staphylococcus aureus NOT DETECTED NOT DETECTED Final   Streptococcus species NOT DETECTED NOT DETECTED Final   Streptococcus agalactiae NOT DETECTED NOT DETECTED Final   Streptococcus pneumoniae NOT DETECTED NOT DETECTED Final   Streptococcus pyogenes NOT DETECTED NOT DETECTED Final   Acinetobacter baumannii NOT DETECTED NOT DETECTED Final   Enterobacteriaceae species DETECTED (A) NOT DETECTED Final    Comment: CRITICAL RESULT CALLED TO, READ BACK BY AND VERIFIED WITH: [name] HALLAJI 10/27/16 2220 [place]    Enterobacter cloacae complex NOT DETECTED NOT DETECTED Final   Escherichia coli DETECTED (A) NOT DETECTED Final    Comment: CRITICAL RESULT CALLED TO, READ BACK BY AND VERIFIED WITH: [name] HALLAJI 10/27/16 2220 [place]    Klebsiella oxytoca NOT DETECTED NOT DETECTED Final   Klebsiella pneumoniae NOT DETECTED NOT DETECTED Final   Proteus species NOT DETECTED NOT DETECTED Final   Serratia marcescens NOT DETECTED NOT DETECTED Final   Carbapenem resistance NOT DETECTED NOT DETECTED Final   Haemophilus influenzae NOT DETECTED NOT DETECTED Final   Neisseria meningitidis NOT DETECTED NOT DETECTED Final   Pseudomonas aeruginosa NOT DETECTED NOT DETECTED Final   Candida albicans NOT DETECTED NOT DETECTED Final   Candida glabrata NOT DETECTED NOT DETECTED Final   Candida krusei NOT DETECTED NOT DETECTED Final   Candida parapsilosis NOT DETECTED NOT DETECTED Final   Candida tropicalis NOT DETECTED NOT DETECTED Final  Urine culture     Status: Abnormal   Collection Time: 10/26/16  8:30 PM  Result Value Ref Range Status   Specimen Description URINE, RANDOM  Final   Special Requests NONE  Final   Culture (A)  Final    <10,000 COLONIES/mL INSIGNIFICANT GROWTH Performed at [facility], [address], [place], [place] [address]    Report Status 10/28/2016 FINAL  Final  C difficile quick scan w PCR reflex     Status:  None   Collection Time: 10/27/16  4:58 PM  Result Value Ref Range Status   C Diff antigen NEGATIVE NEGATIVE Final   C Diff toxin NEGATIVE NEGATIVE Final   C Diff interpretation No C. difficile detected.  Final  Gastrointestinal Panel by PCR , Stool     Status: None   Collection Time: 10/27/16  4:58 PM  Result Value Ref Range Status   Campylobacter species NOT DETECTED NOT DETECTED Final   Plesimonas shigelloides NOT DETECTED NOT DETECTED Final   Salmonella species NOT DETECTED NOT DETECTED Final   Yersinia enterocolitica NOT DETECTED NOT DETECTED Final   Vibrio species NOT DETECTED NOT DETECTED Final   Vibrio cholerae NOT DETECTED NOT DETECTED Final   Enteroaggregative E coli (EAEC) NOT DETECTED NOT DETECTED Final   Enteropathogenic  E coli (EPEC) NOT DETECTED NOT DETECTED Final   Enterotoxigenic E coli (ETEC) NOT DETECTED NOT DETECTED Final   Shiga like toxin producing E coli (STEC) NOT DETECTED NOT DETECTED Final   Shigella/Enteroinvasive E coli (EIEC) NOT DETECTED NOT DETECTED Final   Cryptosporidium NOT DETECTED NOT DETECTED Final   Cyclospora cayetanensis NOT DETECTED NOT DETECTED Final   Entamoeba histolytica NOT DETECTED NOT DETECTED Final   Giardia lamblia NOT DETECTED NOT DETECTED Final   Adenovirus F40/41 NOT DETECTED NOT DETECTED Final   Astrovirus NOT DETECTED NOT DETECTED Final   Norovirus GI/GII NOT DETECTED NOT DETECTED Final   Rotavirus A NOT DETECTED NOT DETECTED Final   Sapovirus (I, II, IV, and V) NOT DETECTED NOT DETECTED Final    Medical History: Past Medical History:  Diagnosis Date  . Anemia   . CAD (coronary artery disease) 07/08/2011   Overview:  a. 07/2006: Anterior ST elevation MI. b. 07/2006: PCI with BMS to LAD and RCA. c. 09/2006: Non ST elevation. d. LVEF 30%.  Discussed ICD, not currently interested.   . 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 Dyslipidemia 07/30/2011   Overview:  High triglycerides  . GERD  (gastroesophageal reflux disease) 07/08/2011  . History of Clostridium difficile colitis   . History of myocardial infarction   . Hyperlipidemia, unspecified 07/08/2011  . Hypothyroidism   . 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 Type II diabetes mellitus, uncontrolled ([place]) 07/08/2011  . Vitamin D deficiency     Medications:  Infusions:  . heparin 1,150 Units/hr (10/29/16 2242)    Assessment: 36 yom admitted for chills with worsening diarrhea. Fever on admission, ID started meropenem. GI following for UC and diarrhea. Tonight patient had VTach and low electrolytes. K 2.7, Mg 1.7, phos 1.6, Ca 7.2 corrected to 8.5.   Goal of Therapy:  Electrolytes WNL  Plan:  K=3.4, Mg=2.0, phos=2.2  Kphos 42mol IV x 1. This will also provide 45 MEQ of KCL. Recheck labs in the AM.  MRamond Dial Pharm.D., BCPS Clinical pharmacist 10/30/2016,9:31 AM

## 2016-10-30 NOTE — Progress Notes (Signed)
Per Dr. Molli Hazard okay to hold Rampril for low BP and aspirin as pt is having blood in the stool.

## 2016-10-30 NOTE — Care Management Important Message (Signed)
Important Message  Patient Details  Name: David [name] MRN: [id number] Date of Birth: November 05, 1945   Medicare Important Message Given:  Yes    Beverly Sessions, RN 10/30/2016, 3:57 PM

## 2016-10-30 NOTE — Progress Notes (Signed)
ANTICOAGULATION CONSULT NOTE - Initial Consult  Pharmacy Consult for heparin Indication: thrombosis of proximal inferior mesenteric vein  Allergies  Allergen Reactions  . Codeine Nausea And Vomiting and Other (See Comments)    Patient Measurements: Height: 6' (182.9 cm) Weight: 184 lb 9.6 oz (83.7 kg) IBW/kg (Calculated) : 77.6 Heparin Dosing Weight: 79.4 kg  Vital Signs: Temp: 98 F (36.7 C) (01/19 0724) Temp Source: Oral (01/19 0724) BP: 112/51 (01/19 0846) Pulse Rate: 84 (01/19 0846)  Labs:  Recent Labs  10/28/16 0756  10/29/16 0155 10/29/16 1404 10/29/16 2328 10/30/16 0819  HGB 9.1*  --  9.6*  --   --  9.5*  HCT 26.4*  --  27.3*  --   --  27.5*  PLT 166  --  177  --   --  211  HEPARINUNFRC 0.53  < > 0.31 0.27* 0.37 0.40  CREATININE 0.88  --  0.64 0.74  --   --   < > = values in this interval not displayed.  Estimated Creatinine Clearance: 94.3 mL/min (by C-G formula based on SCr of 0.74 mg/dL).   Medical History: Past Medical History:  Diagnosis Date  . Anemia   . CAD (coronary artery disease) 07/08/2011   Overview:  a. 07/2006: Anterior ST elevation MI. b. 07/2006: PCI with BMS to LAD and RCA. c. 09/2006: Non ST elevation. d. LVEF 30%.  Discussed ICD, not currently interested.   . 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 Dyslipidemia 07/30/2011   Overview:  High triglycerides  . GERD (gastroesophageal reflux disease) 07/08/2011  . History of Clostridium difficile colitis   . History of myocardial infarction   . Hyperlipidemia, unspecified 07/08/2011  . Hypothyroidism   . 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 Type II diabetes mellitus, uncontrolled ([place]) 07/08/2011  . Vitamin D deficiency     Medications:  Infusions:  . heparin 1,150 Units/hr (10/29/16 2242)    Assessment: 70 yom cc fever and cough. Sepsis with UC, on abx. CT noted acute thrombosis of proximal inferior mesenteric vein - heparin  started in ED. No AC on PTA list. Some recent bleeding related to UC, Hgb 10.9 on 1/15. Continue to follow Hgb.   Goal of Therapy:  Heparin level 0.3-0.7 units/ml Monitor platelets by anticoagulation protocol: Yes   Plan:  HL=0.27 slightly subtherapeutic. Due to pt nose bleeds and blood in diarrhea will not bolus but will increase rate to 1150 units/hr. Recheck in 8 hours.  1/18 2328 HL therapeutic x 1. Continue current rate. Will recheck HL in 8 hours. NAC  1/19 HL =0.40 therapeutic x2. Recheck HL in the AM along with a CBC.  Ramond Dial, Pharm.D., BCPS Clinical Pharmacist 10/30/2016,9:05 AM

## 2016-10-30 NOTE — Progress Notes (Signed)
[place] INFECTIOUS DISEASE PROGRESS NOTE Date of Admission:  10/26/2016     ID: David [name] is a 71 y.o. male with E coli bacteremia Principal Problem:   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CC) Active Problems:   Ulcerative colitis (HCC)   Sepsis ([place])   Fever   Elevated lactic acid level   Subjective: No fevers, clinically improving  ROS  Eleven systems are reviewed and negative except per hpi  Medications:  Antibiotics Given (last 72 hours)    Date/Time Action Medication Dose Rate   10/27/16 1514 Given   piperacillin-tazobactam (ZOSYN) IVPB 3.375 g 3.375 g 12.5 mL/hr   10/27/16 1840 Given   vancomycin (VANCOCIN) IVPB 1000 mg/200 mL premix 1,000 mg 200 mL/hr   10/27/16 2354 Given   meropenem (MERREM) IVPB SOLR 1 g 1 g 100 mL/hr   10/28/16 0805 Given   meropenem (MERREM) IVPB SOLR 1 g 1 g 100 mL/hr   10/28/16 1619 Given   meropenem (MERREM) IVPB SOLR 1 g 1 g 100 mL/hr   10/28/16 2346 Given   meropenem (MERREM) IVPB SOLR 1 g 1 g 100 mL/hr   10/29/16 0755 Given   meropenem (MERREM) IVPB SOLR 1 g 1 g 100 mL/hr   10/29/16 1538 Given   meropenem (MERREM) IVPB SOLR 1 g 1 g 100 mL/hr   10/30/16 0223 Given   meropenem (MERREM) IVPB SOLR 1 g 1 g 100 mL/hr   10/30/16 1001 Given   meropenem (MERREM) IVPB SOLR 1 g 1 g 100 mL/hr     . aspirin EC  81 mg Oral BID  . atorvastatin  40 mg Oral Daily  . budesonide  6 mg Oral Daily  . carvedilol  3.125 mg Oral BID WC  . cholecalciferol  2,000 Units Oral Daily  . feeding supplement (ENSURE ENLIVE)  237 mL Oral TID BM  . insulin aspart  0-9 Units Subcutaneous TID WC  . levothyroxine  50 mcg Oral QAC breakfast  . meropenem  1 g Intravenous Q8H  . pantoprazole  40 mg Oral QAC breakfast  . potassium phosphate IVPB (mmol)  30 mmol Intravenous Once  . ramipril  5 mg Oral Daily  . sodium chloride flush  3 mL Intravenous Q12H    Objective: Vital signs in last 24 hours: Temp:  [97.7 F (36.5 C)-98.5 F (36.9 C)] 98.1  F (36.7 C) (01/19 1218) Pulse Rate:  [75-85] 85 (01/19 1218) Resp:  [17-18] 18 (01/19 1218) BP: (100-119)/(48-66) 105/61 (01/19 1218) SpO2:  [97 %-99 %] 97 % (01/19 1218) Weight:  [83.7 kg (184 lb 9.6 oz)] 83.7 kg (184 lb 9.6 oz) (01/19 0500) Constitutional: He is oriented to person, place, and time. He appears thin, moderately ill appearing  HENT: anicteric Mouth/Throat: Oropharynx is clear and moist. No oropharyngeal exudate.  Cardiovascular: Normal rate, regular rhythm and normal heart sounds. Exam reveals no gallop and no friction rub.  No murmur heard.  Pulmonary/Chest: Effort normal and breath sounds normal. No respiratory distress. He has no wheezes.  Abdominal: Soft. Bowel sounds are normal. He exhibits no distension. Mild diffuse ttp  Lymphadenopathy:  He has no cervical adenopathy.  Neurological: He is alert and oriented to person, place, and time.  Skin: Skin is warm and dry. No rash noted. No erythema.  Psychiatric: He has a normal mood and affect. His behavior is normal.   Lab Results  Recent Labs  10/29/16 0155 10/29/16 1404 10/30/16 0819  WBC 7.5  --  6.4  HGB 9.6*  --  9.5*  HCT 27.3*  --  27.5*  NA 139 137 138  K 2.7* 3.1* 3.4*  CL 111 110 111  CO2 21* 21* 22  BUN 9 7 5*  CREATININE 0.64 0.74 0.76    Microbiology: Results for orders placed or performed during the hospital encounter of 10/26/16  Culture, blood (Routine x 2)     Status: Abnormal   Collection Time: 10/26/16  6:12 PM  Result Value Ref Range Status   Specimen Description BLOOD RIGHT FA  Final   Special Requests   Final    BOTTLES DRAWN AEROBIC AND ANAEROBIC AER 16ML ANA 16ML   Culture  Setup Time   Final    GRAM NEGATIVE RODS ANAEROBIC BOTTLE ONLY CRITICAL RESULT CALLED TO, READ BACK BY AND VERIFIED WITH: [facility] HALLAJI 10/27/16 @ [address] [place] Performed at [facility], [address] [place], [place] [address]    Culture ESCHERICHIA COLI (A)  Final   Report Status 10/30/2016 FINAL   Final   Organism ID, Bacteria ESCHERICHIA COLI  Final      Susceptibility   Escherichia coli - MIC*    AMPICILLIN <=2 SENSITIVE Sensitive     CEFAZOLIN <=4 SENSITIVE Sensitive     CEFEPIME <=1 SENSITIVE Sensitive     CEFTAZIDIME <=1 SENSITIVE Sensitive     CEFTRIAXONE <=1 SENSITIVE Sensitive     CIPROFLOXACIN <=0.25 SENSITIVE Sensitive     GENTAMICIN <=1 SENSITIVE Sensitive     IMIPENEM <=0.25 SENSITIVE Sensitive     TRIMETH/SULFA <=20 SENSITIVE Sensitive     AMPICILLIN/SULBACTAM <=2 SENSITIVE Sensitive     PIP/TAZO <=4 SENSITIVE Sensitive     Extended ESBL NEGATIVE Sensitive     * ESCHERICHIA COLI  Culture, blood (Routine x 2)     Status: None (Preliminary result)   Collection Time: 10/26/16  6:12 PM  Result Value Ref Range Status   Specimen Description BLOOD LEFT FA  Final   Special Requests   Final    BOTTLES DRAWN AEROBIC AND ANAEROBIC AER 11ML ANA 10ML   Culture NO GROWTH 4 DAYS  Final   Report Status PENDING  Incomplete  Blood Culture ID Panel (Reflexed)     Status: Abnormal   Collection Time: 10/26/16  6:12 PM  Result Value Ref Range Status   Enterococcus species NOT DETECTED NOT DETECTED Final   Listeria monocytogenes NOT DETECTED NOT DETECTED Final   Staphylococcus species NOT DETECTED NOT DETECTED Final   Staphylococcus aureus NOT DETECTED NOT DETECTED Final   Streptococcus species NOT DETECTED NOT DETECTED Final   Streptococcus agalactiae NOT DETECTED NOT DETECTED Final   Streptococcus pneumoniae NOT DETECTED NOT DETECTED Final   Streptococcus pyogenes NOT DETECTED NOT DETECTED Final   Acinetobacter baumannii NOT DETECTED NOT DETECTED Final   Enterobacteriaceae species DETECTED (A) NOT DETECTED Final    Comment: CRITICAL RESULT CALLED TO, READ BACK BY AND VERIFIED WITH: [name] HALLAJI 10/27/16 2220 [place]    Enterobacter cloacae complex NOT DETECTED NOT DETECTED Final   Escherichia coli DETECTED (A) NOT DETECTED Final    Comment: CRITICAL RESULT CALLED TO, READ BACK  BY AND VERIFIED WITH: [name] HALLAJI 10/27/16 2220 [place]    Klebsiella oxytoca NOT DETECTED NOT DETECTED Final   Klebsiella pneumoniae NOT DETECTED NOT DETECTED Final   Proteus species NOT DETECTED NOT DETECTED Final   Serratia marcescens NOT DETECTED NOT DETECTED Final   Carbapenem resistance NOT DETECTED NOT DETECTED Final   Haemophilus influenzae NOT DETECTED NOT DETECTED Final  Neisseria meningitidis NOT DETECTED NOT DETECTED Final   Pseudomonas aeruginosa NOT DETECTED NOT DETECTED Final   Candida albicans NOT DETECTED NOT DETECTED Final   Candida glabrata NOT DETECTED NOT DETECTED Final   Candida krusei NOT DETECTED NOT DETECTED Final   Candida parapsilosis NOT DETECTED NOT DETECTED Final   Candida tropicalis NOT DETECTED NOT DETECTED Final  Urine culture     Status: Abnormal   Collection Time: 10/26/16  8:30 PM  Result Value Ref Range Status   Specimen Description URINE, RANDOM  Final   Special Requests NONE  Final   Culture (A)  Final    <10,000 COLONIES/mL INSIGNIFICANT GROWTH Performed at [facility] [address], [place], [place] [address]    Report Status 10/28/2016 FINAL  Final  C difficile quick scan w PCR reflex     Status: None   Collection Time: 10/27/16  4:58 PM  Result Value Ref Range Status   C Diff antigen NEGATIVE NEGATIVE Final   C Diff toxin NEGATIVE NEGATIVE Final   C Diff interpretation No C. difficile detected.  Final  Gastrointestinal Panel by PCR , Stool     Status: None   Collection Time: 10/27/16  4:58 PM  Result Value Ref Range Status   Campylobacter species NOT DETECTED NOT DETECTED Final   Plesimonas shigelloides NOT DETECTED NOT DETECTED Final   Salmonella species NOT DETECTED NOT DETECTED Final   Yersinia enterocolitica NOT DETECTED NOT DETECTED Final   Vibrio species NOT DETECTED NOT DETECTED Final   Vibrio cholerae NOT DETECTED NOT DETECTED Final   Enteroaggregative E coli (EAEC) NOT DETECTED NOT DETECTED Final   Enteropathogenic  E coli (EPEC) NOT DETECTED NOT DETECTED Final   Enterotoxigenic E coli (ETEC) NOT DETECTED NOT DETECTED Final   Shiga like toxin producing E coli (STEC) NOT DETECTED NOT DETECTED Final   Shigella/Enteroinvasive E coli (EIEC) NOT DETECTED NOT DETECTED Final   Cryptosporidium NOT DETECTED NOT DETECTED Final   Cyclospora cayetanensis NOT DETECTED NOT DETECTED Final   Entamoeba histolytica NOT DETECTED NOT DETECTED Final   Giardia lamblia NOT DETECTED NOT DETECTED Final   Adenovirus F40/41 NOT DETECTED NOT DETECTED Final   Astrovirus NOT DETECTED NOT DETECTED Final   Norovirus GI/GII NOT DETECTED NOT DETECTED Final   Rotavirus A NOT DETECTED NOT DETECTED Final   Sapovirus (I, II, IV, and V) NOT DETECTED NOT DETECTED Final    Studies/Results: No results found.  Assessment/Plan: David [name] is a 71 y.o. male with ulcerative colitis ongoing flare, admit with fevers, chills and has CT showing IMV clot and diffuse colonic wall thickening. His bcx are negative currently but he is at risk of bacteremia given the clot and inflammation leading to translocation of mainly gram neg and anaerobic pathogens. C dff is negative.  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  NO further testing is required Thank you very much for the consult. Will follow with you.  [place], Azayla Polo P   10/30/2016, 2:40 PM

## 2016-10-30 NOTE — Consult Note (Signed)
Reason for Consult: Nonsustained VT/cardiomyopathy Referring Physician: Dr. Rosilyn Mings hospitalist Cardiologist Dr. Gale Journey David [name] is an 71 y.o. male.  HPI: Patient presents with abdominal pain bleeding no fever chills or sweats sepsis. Patient had rectal bleeding possibly secondary also to colitis. 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 Patient has a known history of coronary disease myocardial infarction status post PCI and stent 2007 at [facility]. At that time patient was found to have cardiomyopathy EF 30% and was recommended for possible AICD at the time the patient was not interested in having it done. Patient has not seen a follow-up with cardiology in quite some time he last saw Dr. Ubaldo Glassing about 18 months ago. Patient has had multiple bouts of poorly controlled ulcerative colitis with diarrhea and rectal bleeding this is his third visit to emergency room and subsequently was admitted because of high-grade fever and inferior mesenteric thrombosis. On monitor patient's on have significant nonsustained ventricular tachycardia wide-complex patient states he was asymptomatic now presents for further evaluation  Past Medical History:  Diagnosis Date  . Anemia   . CAD (coronary artery disease) 07/08/2011   Overview:  a. 07/2006: Anterior ST elevation MI. b. 07/2006: PCI with BMS to LAD and RCA. c. 09/2006: Non ST elevation. d. LVEF 30%.  Discussed ICD, not currently interested.   . 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 Dyslipidemia 07/30/2011   Overview:  High triglycerides  . GERD (gastroesophageal reflux disease) 07/08/2011  . History of Clostridium difficile colitis   . History of myocardial infarction   . Hyperlipidemia, unspecified 07/08/2011  . Hypothyroidism   . 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 Type II diabetes mellitus, uncontrolled ([place])  07/08/2011  . Vitamin D deficiency     Past Surgical History:  Procedure Laterality Date  . CARDIAC CATHETERIZATION    . CHOLECYSTECTOMY    . COLONOSCOPY  07/20/2006   Crohn's disease  . CORONARY STENT PLACEMENT    . FLEXIBLE SIGMOIDOSCOPY N/A 09/25/2016   Procedure: FLEXIBLE SIGMOIDOSCOPY;  Surgeon: [name], MD;  Location: [facility] ENDOSCOPY;  Service: Endoscopy;  Laterality: N/A;  . MELANOMA REMOVED    . parotid gland removal      Family History  Problem Relation Age of Onset  . Heart disease Mother   . Heart attack Father   . Heart disease Sister   . Heart disease Brother     Social History:  reports that he has never smoked. He has never used smokeless tobacco. He reports that he does not drink alcohol or use drugs.  Allergies:  Allergies  Allergen Reactions  . Codeine Nausea And Vomiting and Other (See Comments)    Medications: I have reviewed the patient's current medications.  Results for orders placed or performed during the hospital encounter of 10/26/16 (from the past 48 hour(s))  Heparin level (unfractionated)     Status: None   Collection Time: 10/28/16  3:49 PM  Result Value Ref Range   Heparin Unfractionated 0.38 0.30 - 0.70 IU/mL    Comment:        IF HEPARIN RESULTS ARE BELOW EXPECTED VALUES, AND PATIENT DOSAGE HAS BEEN CONFIRMED, SUGGEST FOLLOW UP TESTING OF ANTITHROMBIN III LEVELS.   Glucose, capillary     Status: Abnormal   Collection Time: 10/28/16  6:36 PM  Result Value Ref Range   Glucose-Capillary 118 (H) 65 - 99 mg/dL  Glucose, capillary  Status: Abnormal   Collection Time: 10/28/16 11:44 PM  Result Value Ref Range   Glucose-Capillary 102 (H) 65 - 99 mg/dL  Magnesium     Status: None   Collection Time: 10/29/16  1:55 AM  Result Value Ref Range   Magnesium 1.7 1.7 - 2.4 mg/dL  Phosphorus     Status: Abnormal   Collection Time: 10/29/16  1:55 AM  Result Value Ref Range   Phosphorus 1.6 (L) 2.5 - 4.6 mg/dL  Basic metabolic panel      Status: Abnormal   Collection Time: 10/29/16  1:55 AM  Result Value Ref Range   Sodium 139 135 - 145 mmol/L   Potassium 2.7 (LL) 3.5 - 5.1 mmol/L    Comment: CRITICAL RESULT CALLED TO, READ BACK BY AND VERIFIED WITH MARCELLA TURNER AT 0326 10/29/2016 BY TFK.    Chloride 111 101 - 111 mmol/L   CO2 21 (L) 22 - 32 mmol/L   Glucose, Bld 93 65 - 99 mg/dL   BUN 9 6 - 20 mg/dL   Creatinine, Ser 0.64 0.61 - 1.24 mg/dL   Calcium 7.2 (L) 8.9 - 10.3 mg/dL   GFR calc non Af Amer >60 >60 mL/min   GFR calc Af Amer >60 >60 mL/min    Comment: (NOTE) The eGFR has been calculated using the CKD EPI equation. This calculation has not been validated in all clinical situations. eGFR's persistently <60 mL/min signify possible Chronic Kidney Disease.    Anion gap 7 5 - 15  CBC     Status: Abnormal   Collection Time: 10/29/16  1:55 AM  Result Value Ref Range   WBC 7.5 3.8 - 10.6 K/uL   RBC 3.03 (L) 4.40 - 5.90 MIL/uL   Hemoglobin 9.6 (L) 13.0 - 18.0 g/dL   HCT 27.3 (L) 40.0 - 52.0 %   MCV 89.9 80.0 - 100.0 fL   MCH 31.8 26.0 - 34.0 pg   MCHC 35.3 32.0 - 36.0 g/dL   RDW 15.0 (H) 11.5 - 14.5 %   Platelets 177 150 - 440 K/uL  Heparin level (unfractionated)     Status: None   Collection Time: 10/29/16  1:55 AM  Result Value Ref Range   Heparin Unfractionated 0.31 0.30 - 0.70 IU/mL    Comment:        IF HEPARIN RESULTS ARE BELOW EXPECTED VALUES, AND PATIENT DOSAGE HAS BEEN CONFIRMED, SUGGEST FOLLOW UP TESTING OF ANTITHROMBIN III LEVELS.   Albumin     Status: Abnormal   Collection Time: 10/29/16  1:55 AM  Result Value Ref Range   Albumin 1.7 (L) 3.5 - 5.0 g/dL  Glucose, capillary     Status: Abnormal   Collection Time: 10/29/16  6:46 AM  Result Value Ref Range   Glucose-Capillary 123 (H) 65 - 99 mg/dL  Glucose, capillary     Status: Abnormal   Collection Time: 10/29/16 11:27 AM  Result Value Ref Range   Glucose-Capillary 239 (H) 65 - 99 mg/dL   Comment 1 Notify RN   Basic metabolic panel      Status: Abnormal   Collection Time: 10/29/16  2:04 PM  Result Value Ref Range   Sodium 137 135 - 145 mmol/L   Potassium 3.1 (L) 3.5 - 5.1 mmol/L   Chloride 110 101 - 111 mmol/L   CO2 21 (L) 22 - 32 mmol/L   Glucose, Bld 163 (H) 65 - 99 mg/dL   BUN 7 6 - 20 mg/dL   Creatinine, Ser 0.74  0.61 - 1.24 mg/dL   Calcium 7.3 (L) 8.9 - 10.3 mg/dL   GFR calc non Af Amer >60 >60 mL/min   GFR calc Af Amer >60 >60 mL/min    Comment: (NOTE) The eGFR has been calculated using the CKD EPI equation. This calculation has not been validated in all clinical situations. eGFR's persistently <60 mL/min signify possible Chronic Kidney Disease.    Anion gap 6 5 - 15  Magnesium     Status: None   Collection Time: 10/29/16  2:04 PM  Result Value Ref Range   Magnesium 2.1 1.7 - 2.4 mg/dL  Phosphorus     Status: Abnormal   Collection Time: 10/29/16  2:04 PM  Result Value Ref Range   Phosphorus 1.9 (L) 2.5 - 4.6 mg/dL  Heparin level (unfractionated)     Status: Abnormal   Collection Time: 10/29/16  2:04 PM  Result Value Ref Range   Heparin Unfractionated 0.27 (L) 0.30 - 0.70 IU/mL    Comment:        IF HEPARIN RESULTS ARE BELOW EXPECTED VALUES, AND PATIENT DOSAGE HAS BEEN CONFIRMED, SUGGEST FOLLOW UP TESTING OF ANTITHROMBIN III LEVELS.   Glucose, capillary     Status: Abnormal   Collection Time: 10/29/16  5:31 PM  Result Value Ref Range   Glucose-Capillary 166 (H) 65 - 99 mg/dL   Comment 1 Notify RN   Glucose, capillary     Status: Abnormal   Collection Time: 10/29/16  9:55 PM  Result Value Ref Range   Glucose-Capillary 135 (H) 65 - 99 mg/dL  Heparin level (unfractionated)     Status: None   Collection Time: 10/29/16 11:28 PM  Result Value Ref Range   Heparin Unfractionated 0.37 0.30 - 0.70 IU/mL    Comment:        IF HEPARIN RESULTS ARE BELOW EXPECTED VALUES, AND PATIENT DOSAGE HAS BEEN CONFIRMED, SUGGEST FOLLOW UP TESTING OF ANTITHROMBIN III LEVELS.   Glucose, capillary     Status:  Abnormal   Collection Time: 10/30/16  7:22 AM  Result Value Ref Range   Glucose-Capillary 142 (H) 65 - 99 mg/dL   Comment 1 Notify RN   CBC     Status: Abnormal   Collection Time: 10/30/16  8:19 AM  Result Value Ref Range   WBC 6.4 3.8 - 10.6 K/uL   RBC 3.10 (L) 4.40 - 5.90 MIL/uL   Hemoglobin 9.5 (L) 13.0 - 18.0 g/dL   HCT 27.5 (L) 40.0 - 52.0 %   MCV 88.8 80.0 - 100.0 fL   MCH 30.7 26.0 - 34.0 pg   MCHC 34.6 32.0 - 36.0 g/dL   RDW 14.8 (H) 11.5 - 14.5 %   Platelets 211 150 - 440 K/uL  Basic metabolic panel     Status: Abnormal   Collection Time: 10/30/16  8:19 AM  Result Value Ref Range   Sodium 138 135 - 145 mmol/L   Potassium 3.4 (L) 3.5 - 5.1 mmol/L   Chloride 111 101 - 111 mmol/L   CO2 22 22 - 32 mmol/L   Glucose, Bld 143 (H) 65 - 99 mg/dL   BUN 5 (L) 6 - 20 mg/dL   Creatinine, Ser 0.76 0.61 - 1.24 mg/dL   Calcium 7.4 (L) 8.9 - 10.3 mg/dL   GFR calc non Af Amer >60 >60 mL/min   GFR calc Af Amer >60 >60 mL/min    Comment: (NOTE) The eGFR has been calculated using the CKD EPI equation. This calculation has not  been validated in all clinical situations. eGFR's persistently <60 mL/min signify possible Chronic Kidney Disease.    Anion gap 5 5 - 15  Magnesium     Status: None   Collection Time: 10/30/16  8:19 AM  Result Value Ref Range   Magnesium 2.0 1.7 - 2.4 mg/dL  Phosphorus     Status: Abnormal   Collection Time: 10/30/16  8:19 AM  Result Value Ref Range   Phosphorus 2.2 (L) 2.5 - 4.6 mg/dL  Heparin level (unfractionated)     Status: None   Collection Time: 10/30/16  8:19 AM  Result Value Ref Range   Heparin Unfractionated 0.40 0.30 - 0.70 IU/mL    Comment:        IF HEPARIN RESULTS ARE BELOW EXPECTED VALUES, AND PATIENT DOSAGE HAS BEEN CONFIRMED, SUGGEST FOLLOW UP TESTING OF ANTITHROMBIN III LEVELS.   C-reactive protein     Status: Abnormal   Collection Time: 10/30/16  8:19 AM  Result Value Ref Range   CRP 5.2 (H) <1.0 mg/dL    Comment: Performed at  [facility] [address] [place], [place] [address]  Glucose, capillary     Status: Abnormal   Collection Time: 10/30/16 11:25 AM  Result Value Ref Range   Glucose-Capillary 226 (H) 65 - 99 mg/dL   Comment 1 Notify RN     No results found.  Review of Systems  Constitutional: Positive for chills, fever, malaise/fatigue and weight loss.  HENT: Negative.   Eyes: Negative.   Respiratory: Negative.   Cardiovascular: Negative.   Gastrointestinal: Positive for abdominal pain, blood in stool, diarrhea and melena.  Genitourinary: Negative.   Musculoskeletal: Negative.   Skin: Negative.   Neurological: Positive for weakness.  Endo/Heme/Allergies: Negative.    Blood pressure 105/61, pulse 85, temperature 98.1 F (36.7 C), temperature source Oral, resp. rate 18, height 6' (1.829 m), weight 83.7 kg (184 lb 9.6 oz), SpO2 97 %. Physical Exam  Nursing note and vitals reviewed. Constitutional: He is oriented to person, place, and time. He appears well-developed and well-nourished.  HENT:  Head: Normocephalic and atraumatic.  Eyes: Conjunctivae and EOM are normal. Pupils are equal, round, and reactive to light.  Neck: Normal range of motion. Neck supple.  Cardiovascular: Normal rate and regular rhythm.   Respiratory: Effort normal and breath sounds normal.  GI: Soft. Bowel sounds are normal.  Musculoskeletal: Normal range of motion.  Neurological: He is alert and oriented to person, place, and time. He has normal reflexes.  Skin: Skin is warm and dry.  Psychiatric: He has a normal mood and affect.    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 PLAN Agree with admission to telemetry Continue Coreg therapy Corrected  electrolytes potassium magnesium Agree with any coagulation with heparin Consider long-term medical regulation with Coumadin or Eliquis Continue antibiotic therapy for sepsis Follow-up with GI for ulcerative colitis Conservative medical therapy for coronary disease Continue levothyroxin therapy for thyroid disease Consider AICD for cardiomyopathy Continue diabetes management   Jearldine Cassady D Koray Soter 10/30/2016, 3:46 PM

## 2016-10-30 NOTE — Progress Notes (Signed)
Per Dr. Anselm Jungling okay to change sliding scale to sensitive scale ACHS instead of q 6 hours and notified MD that patient had blood in the stool.

## 2016-10-31 DIAGNOSIS — K55069 Acute infarction of intestine, part and extent unspecified: Secondary | ICD-10-CM

## 2016-10-31 HISTORY — DX: Acute infarction of intestine, part and extent unspecified: K55.069

## 2016-10-31 LAB — GLUCOSE, CAPILLARY
Glucose-Capillary: 154 mg/dL — ABNORMAL HIGH (ref 65–99)
Glucose-Capillary: 285 mg/dL — ABNORMAL HIGH (ref 65–99)
Glucose-Capillary: 276 mg/dL — ABNORMAL HIGH (ref 65–99)
Glucose-Capillary: 222 mg/dL — ABNORMAL HIGH (ref 65–99)
Glucose-Capillary: 308 mg/dL — ABNORMAL HIGH (ref 65–99)

## 2016-10-31 LAB — BLOOD CULTURE ID PANEL (REFLEXED)

## 2016-10-31 LAB — CBC
HEMATOCRIT: 25.3 % — AB (ref 40.0–52.0)
HEMOGLOBIN: 8.9 g/dL — AB (ref 13.0–18.0)
MCH: 31 pg (ref 26.0–34.0)
MCHC: 35.3 g/dL (ref 32.0–36.0)
MCV: 88 fL (ref 80.0–100.0)
Platelets: 232 10*3/uL (ref 150–440)
RBC: 2.88 MIL/uL — AB (ref 4.40–5.90)
RDW: 14.9 % — ABNORMAL HIGH (ref 11.5–14.5)
WBC: 6.1 10*3/uL (ref 3.8–10.6)

## 2016-10-31 LAB — MAGNESIUM: Magnesium: 1.8 mg/dL (ref 1.7–2.4)

## 2016-10-31 LAB — HEPARIN LEVEL (UNFRACTIONATED)
HEPARIN UNFRACTIONATED: 0.17 [IU]/mL — AB (ref 0.30–0.70)
Heparin Unfractionated: 0.21 IU/mL — ABNORMAL LOW (ref 0.30–0.70)

## 2016-10-31 LAB — PHOSPHORUS: PHOSPHORUS: 2.8 mg/dL (ref 2.5–4.6)

## 2016-10-31 LAB — POTASSIUM: Potassium: 3.5 mmol/L (ref 3.5–5.1)

## 2016-10-31 LAB — PROTIME-INR
INR: 1.06
PROTHROMBIN TIME: 13.8 s (ref 11.4–15.2)

## 2016-10-31 MED ORDER — WARFARIN SODIUM 5 MG PO TABS
5.0000 mg | ORAL_TABLET | Freq: Once | ORAL | Status: AC
  Administered 2016-10-31: 5 mg via ORAL
  Filled 2016-10-31: qty 1

## 2016-10-31 MED ORDER — POTASSIUM CHLORIDE CRYS ER 20 MEQ PO TBCR
40.0000 meq | EXTENDED_RELEASE_TABLET | Freq: Once | ORAL | Status: AC
  Administered 2016-10-31: 40 meq via ORAL
  Filled 2016-10-31: qty 2

## 2016-10-31 MED ORDER — HEPARIN BOLUS VIA INFUSION
2500.0000 [IU] | Freq: Once | INTRAVENOUS | Status: AC
  Administered 2016-10-31: 2500 [IU] via INTRAVENOUS
  Filled 2016-10-31: qty 2500

## 2016-10-31 MED ORDER — MAGNESIUM SULFATE 2 GM/50ML IV SOLN
2.0000 g | Freq: Once | INTRAVENOUS | Status: AC
  Administered 2016-10-31: 2 g via INTRAVENOUS
  Filled 2016-10-31: qty 50

## 2016-10-31 MED ORDER — PREDNISONE 20 MG PO TABS
30.0000 mg | ORAL_TABLET | Freq: Every day | ORAL | Status: AC
  Administered 2016-10-31: 30 mg via ORAL
  Filled 2016-10-31: qty 1

## 2016-10-31 MED ORDER — HEPARIN BOLUS VIA INFUSION
600.0000 [IU] | Freq: Once | INTRAVENOUS | Status: AC
  Administered 2016-10-31: 600 [IU] via INTRAVENOUS
  Filled 2016-10-31: qty 600

## 2016-10-31 MED ORDER — WARFARIN - PHARMACIST DOSING INPATIENT
Freq: Every day | Status: DC
  Administered 2016-10-31: 18:00:00

## 2016-10-31 NOTE — Progress Notes (Signed)
ANTICOAGULATION CONSULT NOTE - Initial Consult  Pharmacy Consult for heparin Indication: thrombosis of proximal inferior mesenteric vein  Allergies  Allergen Reactions  . Codeine Nausea And Vomiting and Other (See Comments)    Patient Measurements: Height: 6' (182.9 cm) Weight: 185 lb 8 oz (84.1 kg) IBW/kg (Calculated) : 77.6 Heparin Dosing Weight: 79.4 kg  Vital Signs: Temp: 98.1 F (36.7 C) (01/20 0537) Temp Source: Oral (01/20 0537) BP: 136/70 (01/20 0537) Pulse Rate: 80 (01/20 0537)  Labs:  Recent Labs  10/29/16 0155 10/29/16 1404 10/29/16 2328 10/30/16 0819 10/31/16 0616  HGB 9.6*  --   --  9.5* 8.9*  HCT 27.3*  --   --  27.5* 25.3*  PLT 177  --   --  211 232  HEPARINUNFRC 0.31 0.27* 0.37 0.40 0.21*  CREATININE 0.64 0.74  --  0.76  --     Estimated Creatinine Clearance: 94.3 mL/min (by C-G formula based on SCr of 0.76 mg/dL).   Medical History: Past Medical History:  Diagnosis Date  . Anemia   . CAD (coronary artery disease) 07/08/2011   Overview:  a. 07/2006: Anterior ST elevation MI. b. 07/2006: PCI with BMS to LAD and RCA. c. 09/2006: Non ST elevation. d. LVEF 30%.  Discussed ICD, not currently interested.   . 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 Dyslipidemia 07/30/2011   Overview:  High triglycerides  . GERD (gastroesophageal reflux disease) 07/08/2011  . History of Clostridium difficile colitis   . History of myocardial infarction   . Hyperlipidemia, unspecified 07/08/2011  . Hypothyroidism   . 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 Type II diabetes mellitus, uncontrolled ([place]) 07/08/2011  . Vitamin D deficiency     Medications:  Infusions:  . heparin 1,150 Units/hr (10/30/16 2101)    Assessment: 70 yom cc fever and cough. Sepsis with UC, on abx. 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 No AC on PTA list. Some recent bleeding related to UC,  Hgb 10.9 on 1/15. Continue to follow Hgb.   Goal of Therapy:  Heparin level 0.3-0.7 units/ml Monitor platelets by anticoagulation protocol: Yes   Plan:  HL=0.27 slightly subtherapeutic. Due to pt nose bleeds and blood in diarrhea will not bolus but will increase rate to 1150 units/hr. Recheck in 8 hours.  1/18 2328 HL therapeutic x 1. Continue current rate. Will recheck HL in 8 hours. NAC  1/19 HL =0.40 therapeutic x2. Recheck HL in the AM along with a CBC.  1/20 0616 HL subtherapeutic x 1. RN reports infusion is still running appropriately. Patient has had no nosebleeds or blood in stool on her shift, but in report they noted blood streaked stool. Will give 600 units IV x 1 bolus and increase rate to 1250 units/hr. Recheck HL in 8 hours.   Laural Benes, Pharm.D., BCPS Clinical Pharmacist 10/31/2016,6:59 AM

## 2016-10-31 NOTE — Progress Notes (Signed)
PHARMACY - PHYSICIAN COMMUNICATION CRITICAL VALUE ALERT - BLOOD CULTURE IDENTIFICATION (BCID)  Results for orders placed or performed during the hospital encounter of 10/26/16  Blood Culture ID Panel (Reflexed) (Collected: 10/26/2016  6:12 PM)  Result Value Ref Range   Enterococcus species NOT DETECTED NOT DETECTED   Listeria monocytogenes NOT DETECTED NOT DETECTED   Staphylococcus species NOT DETECTED NOT DETECTED   Staphylococcus aureus NOT DETECTED NOT DETECTED   Streptococcus species DETECTED (A) NOT DETECTED   Streptococcus agalactiae NOT DETECTED NOT DETECTED   Streptococcus pneumoniae NOT DETECTED NOT DETECTED   Streptococcus pyogenes NOT DETECTED NOT DETECTED   Acinetobacter baumannii NOT DETECTED NOT DETECTED   Enterobacteriaceae species NOT DETECTED NOT DETECTED   Enterobacter cloacae complex NOT DETECTED NOT DETECTED   Escherichia coli NOT DETECTED NOT DETECTED   Klebsiella oxytoca NOT DETECTED NOT DETECTED   Klebsiella pneumoniae NOT DETECTED NOT DETECTED   Proteus species NOT DETECTED NOT DETECTED   Serratia marcescens NOT DETECTED NOT DETECTED   Haemophilus influenzae NOT DETECTED NOT DETECTED   Neisseria meningitidis NOT DETECTED NOT DETECTED   Pseudomonas aeruginosa NOT DETECTED NOT DETECTED   Candida albicans NOT DETECTED NOT DETECTED   Candida glabrata NOT DETECTED NOT DETECTED   Candida krusei NOT DETECTED NOT DETECTED   Candida parapsilosis NOT DETECTED NOT DETECTED   Candida tropicalis NOT DETECTED NOT DETECTED    Name of physician (or Provider) Contacted: Vachhani  Changes to prescribed antibiotics required: Continue current therapy with cipro/flagyl being followed by ID.  Tobie Lords 10/31/2016  3:46 PM

## 2016-10-31 NOTE — Progress Notes (Addendum)
ANTICOAGULATION CONSULT NOTE -follow up [facility] for heparin drip /warfarin Indication: thrombosis of proximal inferior mesenteric vein  Allergies  Allergen Reactions  . Codeine Nausea And Vomiting and Other (See Comments)    Patient Measurements: Height: 6' (182.9 cm) Weight: 185 lb 8 oz (84.1 kg) IBW/kg (Calculated) : 77.6 Heparin Dosing Weight: 79.4 kg  Vital Signs: Temp: 98.2 F (36.8 C) (01/20 0747) Temp Source: Oral (01/20 0747) BP: 124/68 (01/20 0747) Pulse Rate: 78 (01/20 0747)  Labs:  Recent Labs  10/29/16 0155 10/29/16 1404  10/30/16 0819 10/31/16 0616 10/31/16 1053 10/31/16 1524  HGB 9.6*  --   --  9.5* 8.9*  --   --   HCT 27.3*  --   --  27.5* 25.3*  --   --   PLT 177  --   --  211 232  --   --   LABPROT  --   --   --   --   --  13.8  --   INR  --   --   --   --   --  1.06  --   HEPARINUNFRC 0.31 0.27*  < > 0.40 0.21*  --  0.17*  CREATININE 0.64 0.74  --  0.76  --   --   --   < > = values in this interval not displayed.  Estimated Creatinine Clearance: 94.3 mL/min (by C-G formula based on SCr of 0.76 mg/dL).   Medical History: Past Medical History:  Diagnosis Date  . Anemia   . CAD (coronary artery disease) 07/08/2011   Overview:  a. 07/2006: Anterior ST elevation MI. b. 07/2006: PCI with BMS to LAD and RCA. c. 09/2006: Non ST elevation. d. LVEF 30%.  Discussed ICD, not currently interested.   . 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 Dyslipidemia 07/30/2011   Overview:  High triglycerides  . GERD (gastroesophageal reflux disease) 07/08/2011  . History of Clostridium difficile colitis   . History of myocardial infarction   . Hyperlipidemia, unspecified 07/08/2011  . Hypothyroidism   . 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 Type II diabetes mellitus, uncontrolled ([place]) 07/08/2011  . Vitamin D deficiency     Medications:  Infusions:  . heparin 1,250 Units/hr (10/31/16 0728)     Assessment: 70 yom cc fever and cough. Sepsis with UC, on abx. 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 No AC on PTA list. Some recent bleeding related to UC, Hgb 10.9 on 1/15. Continue to follow Hgb.   Date  INR  Dose 1/20   1.06    Goal of Therapy:  Heparin level 0.3-0.7 units/ml Monitor platelets by anticoagulation protocol: Yes   Plan:  HL=0.27 slightly subtherapeutic. Due to pt nose bleeds and blood in diarrhea will not bolus but will increase rate to 1150 units/hr. Recheck in 8 hours.  1/18 2328 HL therapeutic x 1. Continue current rate. Will recheck HL in 8 hours. NAC  1/19 HL =0.40 therapeutic x2. Recheck HL in the AM along with a CBC.  1/20 0616 HL subtherapeutic x 1. RN reports infusion is still running appropriately. Patient has had no nosebleeds or blood in stool on her shift, but in report they noted blood streaked stool. Will give 600 units IV x 1 bolus and increase rate to 1250 units/hr. Recheck HL in 8 hours.   1/20: Will initiate warfarin 36m PO x1. Will monitor INR closely as  patient is on Cipro and Flagyl which can increase the INR. Ordered INR for tomorrow morning.   1/20 HL @ 1524= 0.17.  Will give bolus of 2500 units and increase Heparin drip to 1500 units/hr. Will rechek HL in 8 hr.  1/21 0103 HL therapeutic x 1. Continue current rate. Recheck HL in 8 hours. NAC  Merrill,Kristin A, PharmD 10/31/2016,4:28 PM

## 2016-10-31 NOTE — Consult Note (Signed)
GI Inpatient Follow-up Note  Patient Identification: David [name] is a 71 y.o. male h/x of ulcerative colitis complicated by Ecoli bacteremia and IMV thrombosis. Cannot tolerate mesalamine. Seen by ID and started on Cipro and Flagyl. Dr Vicente Males started patient on Entocort which is approved for Crohn's since released in [place]. At present stools are forming without any blood at this time.   Subjective:      Scheduled Inpatient Medications:  . aspirin EC  81 mg Oral BID  . atorvastatin  40 mg Oral Daily  . budesonide  6 mg Oral Daily  . carvedilol  3.125 mg Oral BID WC  . cholecalciferol  2,000 Units Oral Daily  . ciprofloxacin  500 mg Oral BID  . feeding supplement (ENSURE ENLIVE)  237 mL Oral TID BM  . insulin aspart  0-9 Units Subcutaneous TID WC  . levothyroxine  50 mcg Oral QAC breakfast  . magnesium sulfate 1 - 4 g bolus IVPB  2 g Intravenous Once  . metroNIDAZOLE  500 mg Oral Q12H  . pantoprazole  40 mg Oral QAC breakfast  . ramipril  5 mg Oral Daily  . sodium chloride flush  3 mL Intravenous Q12H    Continuous Inpatient Infusions:   . heparin 1,250 Units/hr (10/31/16 0728)    PRN Inpatient Medications:  acetaminophen **OR** acetaminophen, ondansetron **OR** ondansetron (ZOFRAN) IV, zolpidem  Review of Systems:  Constitutional: Weight is stable.  Eyes: No changes in vision. ENT: No oral lesions, sore throat.  GI: see HPI.  Heme/Lymph: No easy bruising.  CV: No chest pain.  GU: No hematuria.  Integumentary: No rashes.  Neuro: No headaches.  Psych: No depression/anxiety.  Endocrine: No heat/cold intolerance.  Allergic/Immunologic: No urticaria.  Resp: No cough, SOB.  Musculoskeletal: No joint swelling.    Physical Examination: BP 124/68 (BP Location: Left Arm)   Pulse 78   Temp 98.2 F (36.8 C) (Oral)   Resp 18   Ht 6' (1.829 m)   Wt 84.1 kg (185 lb 8 oz)   SpO2 97%   BMI 25.16 kg/m  Gen: NAD, alert and oriented x 4 HEENT: PEERLA, EOMI, Neck: supple,  no JVD or thyromegaly Chest: CTA bilaterally, no wheezes, crackles, or other adventitious sounds CV: RRR, no m/g/c/r Abd: soft, NT, ND, +BS in all four quadrants; no HSM, guarding, ridigity, or rebound tenderness Ext: no edema Skin: no rash or lesions noted   Data: Lab Results  Component Value Date   WBC 6.1 10/31/2016   HGB 8.9 (L) 10/31/2016   HCT 25.3 (L) 10/31/2016   MCV 88.0 10/31/2016   PLT 232 10/31/2016    Recent Labs Lab 10/29/16 0155 10/30/16 0819 10/31/16 0616  HGB 9.6* 9.5* 8.9*   Lab Results  Component Value Date   NA 138 10/30/2016   K 3.5 10/31/2016   CL 111 10/30/2016   CO2 22 10/30/2016   BUN 5 (L) 10/30/2016   CREATININE 0.76 10/30/2016   Lab Results  Component Value Date   ALT 15 (L) 10/26/2016   AST 25 10/26/2016   ALKPHOS 121 10/26/2016   BILITOT 0.9 10/26/2016    Recent Labs Lab 10/27/16 0143 10/31/16 1053  APTT 36  --   INR  --  1.06    Assessment/Plan: Mr. Aloi is a 71 y.o. male with UC complicated by E coli bacteremia and IMV thrombosis in area of colitis. Entocort is released in [place] and approved for Crohn's disease. Often thrombosis can be ominous sign for aggressive  colitis.  Hospitalist reached out to Dr. Ola Spurr and was okay with starting Prednisone since infection under control. I think this a better choice given situation. Will D/C Entocort. Complete course of Cipro and Flagyl as outpatient.  IMV thrombosis: Start coumadin. Keep on PPI.   Outpatient plan: Contact [facility] for appointment to discuss starting of Entyvio  Recommendations:  Please call with questions or concerns.  [place] Jetty, MD

## 2016-10-31 NOTE — Progress Notes (Signed)
MEDICATION RELATED CONSULT NOTE - INITIAL   Pharmacy Consult for electrolyte monitoring and replacement Indication: hypokalemia/VTach  Allergies  Allergen Reactions  . Codeine Nausea And Vomiting and Other (See Comments)    Patient Measurements: Height: 6' (182.9 cm) Weight: 185 lb 8 oz (84.1 kg) IBW/kg (Calculated) : 77.6 Adjusted Body Weight:   Labs:  Recent Labs  10/29/16 0155 10/29/16 1404 10/30/16 0819 10/31/16 0616  WBC 7.5  --  6.4 6.1  HGB 9.6*  --  9.5* 8.9*  HCT 27.3*  --  27.5* 25.3*  PLT 177  --  211 232  CREATININE 0.64 0.74 0.76  --   MG 1.7 2.1 2.0 1.8  PHOS 1.6* 1.9* 2.2* 2.8  ALBUMIN 1.7*  --   --   --    Estimated Creatinine Clearance: 94.3 mL/min (by C-G formula based on SCr of 0.76 mg/dL).   Medical History: Past Medical History:  Diagnosis Date  . Anemia   . CAD (coronary artery disease) 07/08/2011   Overview:  a. 07/2006: Anterior ST elevation MI. b. 07/2006: PCI with BMS to LAD and RCA. c. 09/2006: Non ST elevation. d. LVEF 30%.  Discussed ICD, not currently interested.   . 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 Dyslipidemia 07/30/2011   Overview:  High triglycerides  . GERD (gastroesophageal reflux disease) 07/08/2011  . History of Clostridium difficile colitis   . History of myocardial infarction   . Hyperlipidemia, unspecified 07/08/2011  . Hypothyroidism   . 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 Type II diabetes mellitus, uncontrolled ([place]) 07/08/2011  . Vitamin D deficiency     Medications:  Infusions:  . heparin 1,250 Units/hr (10/31/16 0728)    Assessment: 57 yom admitted for chills with worsening diarrhea. Fever on admission, ID started meropenem. GI following for UC and diarrhea. Tonight patient had VTach and low electrolytes. K 2.7, Mg 1.7, phos 1.6, Ca 7.2 corrected to 8.5.   Goal of Therapy:  Electrolytes WNL  Plan:  K=3.5, Mg=1.8, phos=2.8 Will give Magnesium 2  gram IV x 1 as Mag is borderline and trending down.   Recheck labs in the AM.  Jatinder Mcdonagh A, Pharm.D., BCPS Clinical pharmacist 10/31/2016,9:17 AM

## 2016-10-31 NOTE — Progress Notes (Addendum)
ANTICOAGULATION CONSULT NOTE - Initial Consult  Pharmacy Consult for heparin/warfarin Indication: thrombosis of proximal inferior mesenteric vein  Allergies  Allergen Reactions  . Codeine Nausea And Vomiting and Other (See Comments)    Patient Measurements: Height: 6' (182.9 cm) Weight: 185 lb 8 oz (84.1 kg) IBW/kg (Calculated) : 77.6 Heparin Dosing Weight: 79.4 kg  Vital Signs: Temp: 98.2 F (36.8 C) (01/20 0747) Temp Source: Oral (01/20 0747) BP: 124/68 (01/20 0747) Pulse Rate: 78 (01/20 0747)  Labs:  Recent Labs  10/29/16 0155 10/29/16 1404 10/29/16 2328 10/30/16 0819 10/31/16 0616 10/31/16 1053  HGB 9.6*  --   --  9.5* 8.9*  --   HCT 27.3*  --   --  27.5* 25.3*  --   PLT 177  --   --  211 232  --   LABPROT  --   --   --   --   --  13.8  INR  --   --   --   --   --  1.06  HEPARINUNFRC 0.31 0.27* 0.37 0.40 0.21*  --   CREATININE 0.64 0.74  --  0.76  --   --     Estimated Creatinine Clearance: 94.3 mL/min (by C-G formula based on SCr of 0.76 mg/dL).   Medical History: Past Medical History:  Diagnosis Date  . Anemia   . CAD (coronary artery disease) 07/08/2011   Overview:  a. 07/2006: Anterior ST elevation MI. b. 07/2006: PCI with BMS to LAD and RCA. c. 09/2006: Non ST elevation. d. LVEF 30%.  Discussed ICD, not currently interested.   . 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 Dyslipidemia 07/30/2011   Overview:  High triglycerides  . GERD (gastroesophageal reflux disease) 07/08/2011  . History of Clostridium difficile colitis   . History of myocardial infarction   . Hyperlipidemia, unspecified 07/08/2011  . Hypothyroidism   . 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 Type II diabetes mellitus, uncontrolled ([place]) 07/08/2011  . Vitamin D deficiency     Medications:  Infusions:  . heparin 1,250 Units/hr (10/31/16 0728)    Assessment: 70 yom cc fever and cough. Sepsis with UC, on abx. CT noted acute  thrombosis of proximal inferior mesenteric vein - heparin started in ED. No AC on PTA list. Some recent bleeding related to UC, Hgb 10.9 on 1/15. Continue to follow Hgb.   Date  INR  Dose 1/20   1.06    Goal of Therapy:  Heparin level 0.3-0.7 units/ml Monitor platelets by anticoagulation protocol: Yes   Plan:  HL=0.27 slightly subtherapeutic. Due to pt nose bleeds and blood in diarrhea will not bolus but will increase rate to 1150 units/hr. Recheck in 8 hours.  1/18 2328 HL therapeutic x 1. Continue current rate. Will recheck HL in 8 hours. NAC  1/19 HL =0.40 therapeutic x2. Recheck HL in the AM along with a CBC.  1/20 0616 HL subtherapeutic x 1. RN reports infusion is still running appropriately. Patient has had no nosebleeds or blood in stool on her shift, but in report they noted blood streaked stool. Will give 600 units IV x 1 bolus and increase rate to 1250 units/hr. Recheck HL in 8 hours.   1/20: Will initiate warfarin 54m PO x1. Will monitor INR closely as patient is on Cipro and Flagyl which can increase the INR. Ordered INR for tomorrow morning.   KLoree Fee PharmD 10/31/2016,11:50 AM

## 2016-10-31 NOTE — Progress Notes (Signed)
[place] at [place] NAME: David [name]    MR#:  [id number]  DATE OF BIRTH:  July 03, 1946  SUBJECTIVE:  CHIEF COMPLAINT:   Chief Complaint  Patient presents with  . Fever  . Cough   No new complains, had one BM with some blood. No active bleed. Tolerating diet well.  REVIEW OF SYSTEMS:  Review of Systems  Constitutional: Positive for malaise/fatigue. Negative for weight loss.  HENT: Negative for nosebleeds and sore throat.   Eyes: Negative for blurred vision.  Respiratory: Negative for cough, shortness of breath and wheezing.   Cardiovascular: Negative for chest pain, orthopnea, leg swelling and PND.  Gastrointestinal: Negative for abdominal pain, blood in stool, constipation, diarrhea, heartburn, nausea and vomiting.  Genitourinary: Negative for dysuria and urgency.  Musculoskeletal: Negative for back pain.  Skin: Negative for rash.  Neurological: Positive for weakness. Negative for dizziness, speech change, focal weakness and headaches.  Endo/Heme/Allergies: Does not bruise/bleed easily.  Psychiatric/Behavioral: Negative for depression.   DRUG ALLERGIES:   Allergies  Allergen Reactions  . Codeine Nausea And Vomiting and Other (See Comments)   VITALS:  Blood pressure 124/68, pulse 78, temperature 98.2 F (36.8 C), temperature source Oral, resp. rate 18, height 6' (1.829 m), weight 84.1 kg (185 lb 8 oz), SpO2 97 %. PHYSICAL EXAMINATION:  Physical Exam  Constitutional: He is oriented to person, place, and time and well-developed, well-nourished, and in no distress.  HENT:  Head: Normocephalic and atraumatic.  Eyes: Conjunctivae and EOM are normal. Pupils are equal, round, and reactive to light.  Neck: Normal range of motion. Neck supple. No tracheal deviation present. No thyromegaly present.  Cardiovascular: Normal rate, regular rhythm and normal heart sounds.   Pulmonary/Chest: Effort normal and breath sounds normal. No  respiratory distress. He has no wheezes. He exhibits no tenderness.  Abdominal: Soft. Bowel sounds are normal. He exhibits no distension. There is no tenderness.  Musculoskeletal: Normal range of motion.  Neurological: He is alert and oriented to person, place, and time. No cranial nerve deficit.  Skin: Skin is warm and dry. No rash noted.  Psychiatric: Mood and affect normal.   LABORATORY PANEL:   CBC  Recent Labs Lab 10/31/16 0616  WBC 6.1  HGB 8.9*  HCT 25.3*  PLT 232   ------------------------------------------------------------------------------------------------------------------ Chemistries   Recent Labs Lab 10/26/16 1811  10/30/16 0819 10/31/16 0616  NA 132*  < > 138  --   K 3.8  < > 3.4* 3.5  CL 98*  < > 111  --   CO2 24  < > 22  --   GLUCOSE 429*  < > 143*  --   BUN 16  < > 5*  --   CREATININE 1.12  < > 0.76  --   CALCIUM 8.0*  < > 7.4*  --   MG  --   < > 2.0 1.8  AST 25  --   --   --   ALT 15*  --   --   --   ALKPHOS 121  --   --   --   BILITOT 0.9  --   --   --   < > = values in this interval not displayed. RADIOLOGY:  No results found. ASSESSMENT AND PLAN:  This is a 71 year old male admitted for sepsis secondary to colitis and mesenteric ischemia.  1. E.coli Sepsis: present on admission - His bcx growing E.coli, C dff is negative.  -  Appreciate ID input  - Antibiotics changed per final culture results. cipro + flagyl. - tolerated CLD - advance to FLD- and now regular.  2. Mesenteric ischemia: continue heparin.    Vascular consult to help decide oral anticoagulants and length of therapy.   Started on coumadin today.  3. Ulcerative colitis: The patient had been in remission for the last 10 years after receiving Remicade. He has been reluctant to receive another treatment with his more recent flares due to concerns regarding malignancy associated with immunotherapy.  - Dr. Vicente Males started Entecort earlier but Dr. Epimenio Foot from GI today d/c that and  suggest prednisone, I discussed with Dr. Verita Lamb about safty of prednisone- ( though he is not here on weekend, but he knows the pt and is on his case)- he said as infection is now in control- it is safe to start prednisone.   Pt want to have a trial of that before considering the strongly suggested option of having colectomy ( by both surgery and GI), he says he will like to have some remission and then follow at GI in [place].  4. Coronary artery disease: Stable. Continue aspirin 5. Hypertension: Controlled; continue carvedilol and ACE inhibitor 6. Hyperlipidemia: Continue statin therapy 7. Hypothyroidism: Continue Synthroid.  8. DVT prophylaxis: Therapeutic anticoagulation as above 9. GI prophylaxis: Pantoprazole per home regimen  - patient refusing tertiary care transfer for now and prefers to stay here   All the records are reviewed and case discussed with Care Management/Social Worker. Management plans discussed with the patient, Dr Vicente Males, Nursing and they are in agreement.  CODE STATUS: FULL CODE, Palliative care c/s  TOTAL TIME TAKING CARE OF THIS PATIENT: 35 minutes.   More than 50% of the time was spent in counseling/coordination of care: YES Pt's wife and daughter were present in room today.  POSSIBLE D/C IN 2-3 DAYS, DEPENDING ON CLINICAL CONDITION.   Vaughan Basta M.D on 10/31/2016 at 4:12 PM  Between 7am to 6pm - Pager - [phone]  After 6pm go to [email or web address] - Proofreader  [facility]  Office  [phone]  CC: Primary care physician; Madelyn Brunner, MD  Note: This dictation was prepared with Dragon dictation along with smaller phrase technology. Any transcriptional errors that result from this process are unintentional.

## 2016-10-31 NOTE — Progress Notes (Signed)
[place] at [place] NAME: David [name]    MR#:  [id number]  DATE OF BIRTH:  Feb 15, 1946  SUBJECTIVE:  CHIEF COMPLAINT:   Chief Complaint  Patient presents with  . Fever  . Cough   No new complains, had one BM with some blood. No active bleed. Tolerating diet well.  REVIEW OF SYSTEMS:  Review of Systems  Constitutional: Positive for malaise/fatigue. Negative for weight loss.  HENT: Negative for nosebleeds and sore throat.   Eyes: Negative for blurred vision.  Respiratory: Negative for cough, shortness of breath and wheezing.   Cardiovascular: Negative for chest pain, orthopnea, leg swelling and PND.  Gastrointestinal: Positive for blood in stool and diarrhea. Negative for abdominal pain, constipation, heartburn, nausea and vomiting.  Genitourinary: Negative for dysuria and urgency.  Musculoskeletal: Negative for back pain.  Skin: Negative for rash.  Neurological: Positive for weakness. Negative for dizziness, speech change, focal weakness and headaches.  Endo/Heme/Allergies: Does not bruise/bleed easily.  Psychiatric/Behavioral: Negative for depression.   DRUG ALLERGIES:   Allergies  Allergen Reactions  . Codeine Nausea And Vomiting and Other (See Comments)   VITALS:  Blood pressure 136/70, pulse 80, temperature 98.1 F (36.7 C), temperature source Oral, resp. rate 18, height 6' (1.829 m), weight 84.1 kg (185 lb 8 oz), SpO2 98 %. PHYSICAL EXAMINATION:  Physical Exam  Constitutional: He is oriented to person, place, and time and well-developed, well-nourished, and in no distress.  HENT:  Head: Normocephalic and atraumatic.  Eyes: Conjunctivae and EOM are normal. Pupils are equal, round, and reactive to light.  Neck: Normal range of motion. Neck supple. No tracheal deviation present. No thyromegaly present.  Cardiovascular: Normal rate, regular rhythm and normal heart sounds.   Pulmonary/Chest: Effort normal and breath  sounds normal. No respiratory distress. He has no wheezes. He exhibits no tenderness.  Abdominal: Soft. Bowel sounds are normal. He exhibits no distension. There is no tenderness.  Musculoskeletal: Normal range of motion.  Neurological: He is alert and oriented to person, place, and time. No cranial nerve deficit.  Skin: Skin is warm and dry. No rash noted.  Psychiatric: Mood and affect normal.   LABORATORY PANEL:   CBC  Recent Labs Lab 10/31/16 0616  WBC 6.1  HGB 8.9*  HCT 25.3*  PLT 232   ------------------------------------------------------------------------------------------------------------------ Chemistries   Recent Labs Lab 10/26/16 1811  10/30/16 0819 10/31/16 0616  NA 132*  < > 138  --   K 3.8  < > 3.4* 3.5  CL 98*  < > 111  --   CO2 24  < > 22  --   GLUCOSE 429*  < > 143*  --   BUN 16  < > 5*  --   CREATININE 1.12  < > 0.76  --   CALCIUM 8.0*  < > 7.4*  --   MG  --   < > 2.0 1.8  AST 25  --   --   --   ALT 15*  --   --   --   ALKPHOS 121  --   --   --   BILITOT 0.9  --   --   --   < > = values in this interval not displayed. RADIOLOGY:  No results found. ASSESSMENT AND PLAN:  This is a 71 year old male admitted for sepsis secondary to colitis and mesenteric ischemia.  1. E.coli Sepsis: present on admission - His bcx growing E.coli, C dff is  negative.  - Appreciate ID input  - Antibiotics changed per final culture results. - tolerated CLD - advance to FLD- and now regular.  2. Mesenteric ischemia: continue heparin.    Vascular consult to help decide oral anticoagulants and length of therapy.  3. Ulcerative colitis: The patient had been in remission for the last 10 years after receiving Remicade. He has been reluctant to receive another treatment with his more recent flares due to concerns regarding malignancy associated with immunotherapy.  - Continue antibiotics- after 24-48 hours of antibiotics - GI started him on entocort which has fewer systemic  side effects than prednisone.    Pt want to have a trial of that before considering the strongly suggested option of having colectomy ( by both surgery and GI), he says he will like to have some remission and then follow at GI in [place].  4. Coronary artery disease: Stable. Continue aspirin 5. Hypertension: Controlled; continue carvedilol and ACE inhibitor 6. Hyperlipidemia: Continue statin therapy 7. Hypothyroidism: Continue Synthroid.  8. DVT prophylaxis: Therapeutic anticoagulation as above 9. GI prophylaxis: Pantoprazole per home regimen  - patient refusing tertiary care transfer for now and prefers to stay here   All the records are reviewed and case discussed with Care Management/Social Worker. Management plans discussed with the patient, Dr Vicente Males, Nursing and they are in agreement.  CODE STATUS: FULL CODE, Palliative care c/s  TOTAL TIME TAKING CARE OF THIS PATIENT: 35 minutes.   More than 50% of the time was spent in counseling/coordination of care: YES  POSSIBLE D/C IN 2-3 DAYS, DEPENDING ON CLINICAL CONDITION.   Vaughan Basta M.D on 10/31/2016 at 7:33 AM  Between 7am to 6pm - Pager - [phone]  After 6pm go to [email or web address] - Proofreader  [facility]  Office  [phone]  CC: Primary care physician; Madelyn Brunner, MD  Note: This dictation was prepared with Dragon dictation along with smaller phrase technology. Any transcriptional errors that result from this process are unintentional.

## 2016-11-01 LAB — GLUCOSE, CAPILLARY
Glucose-Capillary: 200 mg/dL — ABNORMAL HIGH (ref 65–99)
Glucose-Capillary: 257 mg/dL — ABNORMAL HIGH (ref 65–99)
Glucose-Capillary: 210 mg/dL — ABNORMAL HIGH (ref 65–99)
Glucose-Capillary: 164 mg/dL — ABNORMAL HIGH (ref 65–99)

## 2016-11-01 LAB — HEPARIN LEVEL (UNFRACTIONATED)
HEPARIN UNFRACTIONATED: 0.85 [IU]/mL — AB (ref 0.30–0.70)
HEPARIN UNFRACTIONATED: 0.86 [IU]/mL — AB (ref 0.30–0.70)
Heparin Unfractionated: 0.54 IU/mL (ref 0.30–0.70)

## 2016-11-01 LAB — PROTIME-INR
INR: 1.07
Prothrombin Time: 13.9 seconds (ref 11.4–15.2)

## 2016-11-01 LAB — CBC
HCT: 26.9 % — ABNORMAL LOW (ref 40.0–52.0)
HEMOGLOBIN: 9.4 g/dL — AB (ref 13.0–18.0)
MCH: 31.3 pg (ref 26.0–34.0)
MCHC: 35 g/dL (ref 32.0–36.0)
MCV: 89.4 fL (ref 80.0–100.0)
PLATELETS: 292 10*3/uL (ref 150–440)
RBC: 3.01 MIL/uL — AB (ref 4.40–5.90)
RDW: 15 % — ABNORMAL HIGH (ref 11.5–14.5)
WBC: 6.2 10*3/uL (ref 3.8–10.6)

## 2016-11-01 LAB — MAGNESIUM: Magnesium: 2.1 mg/dL (ref 1.7–2.4)

## 2016-11-01 LAB — POTASSIUM: Potassium: 4.4 mmol/L (ref 3.5–5.1)

## 2016-11-01 LAB — PHOSPHORUS: PHOSPHORUS: 3 mg/dL (ref 2.5–4.6)

## 2016-11-01 MED ORDER — WARFARIN SODIUM 5 MG PO TABS
5.0000 mg | ORAL_TABLET | Freq: Once | ORAL | Status: AC
  Administered 2016-11-01: 5 mg via ORAL
  Filled 2016-11-01: qty 1

## 2016-11-01 MED ORDER — WARFARIN SODIUM 5 MG PO TABS: 10.0000 mg | ORAL_TABLET | Freq: Once | ORAL | Status: DC

## 2016-11-01 MED ORDER — HEPARIN (PORCINE) IN NACL 100-0.45 UNIT/ML-% IJ SOLN
1150.0000 [IU]/h | INTRAMUSCULAR | Status: DC
  Administered 2016-11-01 – 2016-11-04 (×3): 1150 [IU]/h via INTRAVENOUS
  Filled 2016-11-01 (×4): qty 250

## 2016-11-01 NOTE — Progress Notes (Signed)
ANTICOAGULATION CONSULT NOTE -follow up [place] for heparin drip AND  Warfarin Indication: thrombosis of proximal inferior mesenteric vein      Allergies  Allergen Reactions  . Codeine Nausea And Vomiting and Other (See Comments)    Patient Measurements: Height: 6' (182.9 cm) Weight: 182 lb (82.6 kg) IBW/kg (Calculated) : 77.6 Heparin Dosing Weight: 79.4 kg  Vital Signs: Temp: 97.8 F (36.6 C) (01/21 0732) Temp Source: Oral (01/21 0732) BP: 128/62 (01/21 0732) Pulse Rate: 77 (01/21 0732)  Labs:  Recent Labs (last 2 labs)    Recent Labs  10/29/16 1404  10/30/16 0819 10/31/16 0616 10/31/16 1053 10/31/16 1524 11/01/16 0103 11/01/16 0606 11/01/16 0933  HGB  --   < > 9.5* 8.9*  --   --   --  9.4*  --   HCT  --   --  27.5* 25.3*  --   --   --  26.9*  --   PLT  --   --  211 232  --   --   --  292  --   LABPROT  --   --   --   --  13.8  --   --  13.9  --   INR  --   --   --   --  1.06  --   --  1.07  --   HEPARINUNFRC 0.27*  < > 0.40 0.21*  --  0.17* 0.54  --  0.85*  CREATININE 0.74  --  0.76  --   --   --   --   --   --   < > = values in this interval not displayed.    Estimated Creatinine Clearance: 94.3 mL/min (by C-G formula based on SCr of 0.76 mg/dL).   Medical History:     Past Medical History:  Diagnosis Date  . Anemia   . CAD (coronary artery disease) 07/08/2011   Overview:  a. 07/2006: Anterior ST elevation MI. b. 07/2006: PCI with BMS to LAD and RCA. c. 09/2006: Non ST elevation. d. LVEF 30%.  Discussed ICD, not currently interested.   . 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 Dyslipidemia 07/30/2011   Overview:  High triglycerides  . GERD (gastroesophageal reflux disease) 07/08/2011  . History of Clostridium difficile colitis   . History of myocardial infarction   . Hyperlipidemia, unspecified 07/08/2011  . Hypothyroidism   . Hypothyroidism due to acquired atrophy of thyroid  02/15/2015  . Type II diabetes mellitus, uncontrolled ([place]) 07/08/2011  . Vitamin D deficiency     Medications:  Infusions:  . heparin 1,500 Units/hr (11/01/16 0803)    Assessment: 70 yom cc fever and cough. Sepsis with UC, on abx. 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 No AC on PTA list. Some recent bleeding related to UC, Hgb 10.9 on 1/15. Continue to follow Hgb.   Date                INR                  Dose 1/20                 1.06                 5 mg 1/21                 1.07  Goal of Therapy:  Heparin level 0.3-0.7 units/ml Monitor platelets by anticoagulation protocol: Yes   Plan:  Heparin Drip:   HL=0.27 slightly subtherapeutic. Due to pt nose bleeds and blood in diarrhea will not bolus but will increase rate to 1150 units/hr. Recheck in 8 hours.  1/18 2328 HL therapeutic x 1. Continue current rate. Will recheck HL in 8 hours. NAC  1/19 HL =0.40 therapeutic x2. Recheck HL in the AM along with a CBC.  1/20 0616 HL subtherapeutic x 1. RN reports infusion is still running appropriately. Patient has had no nosebleeds or blood in stool on her shift, but in report they noted blood streaked stool. Will give 600 units IV x 1 bolus and increase rate to 1250 units/hr. Recheck HL in 8 hours.    1/20 HL @ 1524= 0.17.  Will give bolus of 2500 units and increase Heparin drip to 1500 units/hr. Will rechek HL in 8 hr.  1/21 0103 HL therapeutic x 1. Continue current rate. Recheck HL in 8 hours. NAC  1/21 HL@ 0933= 0.85. Will decrease Heparin drip to 1350 units/hr and recheck in 8 hrs.  1/21 Hl@1825  = 0.86. Will decrease Heparin drip to 1150 units/hr and recheck in 8 hours (1/22 @ 0400).  WARFARIN: 1/20: Will initiate warfarin 17m PO x1. Will monitor INR closely as patient is on Cipro and Flagyl which can increase the INR. Ordered INR for tomorrow morning.  1/21 INR= 1.07 . Will give Warfarin 5 mg x1 again today. If  INR does not increase >1.5 tomorrow then anticipate giving 7.554mthen. Patient on CIPRO and METRONIDAZOLE- significant drug interactions with Warfarin.   DaTobie LordsPharmD, BCPS Clinical Pharmacist 11/01/2016

## 2016-11-01 NOTE — Consult Note (Signed)
GI Inpatient Follow-up Note  Patient Identification: David [name] is a 71 y.o. male with UC complicated by E coli bacteremia and IMV thrombosis. Started on Prednisone 40m a day and remains on coumadin.  Subjective: 1-2 BM's per day and more formed without bleeding. Improved since admission.  Scheduled Inpatient Medications:  . aspirin EC  81 mg Oral BID  . atorvastatin  40 mg Oral Daily  . carvedilol  3.125 mg Oral BID WC  . cholecalciferol  2,000 Units Oral Daily  . ciprofloxacin  500 mg Oral BID  . feeding supplement (ENSURE ENLIVE)  237 mL Oral TID BM  . insulin aspart  0-9 Units Subcutaneous TID WC  . levothyroxine  50 mcg Oral QAC breakfast  . metroNIDAZOLE  500 mg Oral Q12H  . pantoprazole  40 mg Oral QAC breakfast  . ramipril  5 mg Oral Daily  . sodium chloride flush  3 mL Intravenous Q12H  . warfarin  5 mg Oral ONCE-1800  . Warfarin - Pharmacist Dosing Inpatient   Does not apply q1800    Continuous Inpatient Infusions:   . heparin 1,350 Units/hr (11/01/16 1059)    PRN Inpatient Medications:  acetaminophen **OR** acetaminophen, ondansetron **OR** ondansetron (ZOFRAN) IV, zolpidem  Review of Systems:  Constitutional: Weight is stable.  Eyes: No changes in vision. ENT: No oral lesions, sore throat.  GI: see HPI.  Heme/Lymph: No easy bruising.  CV: No chest pain.  GU: No hematuria.  Integumentary: No rashes.  Neuro: No headaches.  Psych: No depression/anxiety.  Endocrine: No heat/cold intolerance.  Allergic/Immunologic: No urticaria.  Resp: No cough, SOB.  Musculoskeletal: No joint swelling.    Physical Examination: BP 128/62 (BP Location: Left Arm)   Pulse 77   Temp 97.8 F (36.6 C) (Oral)   Resp 20   Ht 6' (1.829 m)   Wt 82.6 kg (182 lb)   SpO2 98%   BMI 24.68 kg/m  Gen: NAD, alert and oriented x 4 HEENT: PEERLA, EOMI, Neck: supple, no JVD or thyromegaly Chest: CTA bilaterally, no wheezes, crackles, or other adventitious sounds CV: RRR, no  m/g/c/r Abd: soft, NT, ND, +BS in all four quadrants; no HSM, guarding, ridigity, or rebound tenderness Ext: no edema Skin: no rash or lesions noted   Data: Lab Results  Component Value Date   WBC 6.2 11/01/2016   HGB 9.4 (L) 11/01/2016   HCT 26.9 (L) 11/01/2016   MCV 89.4 11/01/2016   PLT 292 11/01/2016    Recent Labs Lab 10/30/16 0819 10/31/16 0616 11/01/16 0606  HGB 9.5* 8.9* 9.4*   Lab Results  Component Value Date   NA 138 10/30/2016   K 4.4 11/01/2016   CL 111 10/30/2016   CO2 22 10/30/2016   BUN 5 (L) 10/30/2016   CREATININE 0.76 10/30/2016   Lab Results  Component Value Date   ALT 15 (L) 10/26/2016   AST 25 10/26/2016   ALKPHOS 121 10/26/2016   BILITOT 0.9 10/26/2016    Recent Labs Lab 10/27/16 0143  11/01/16 0606  APTT 36  --   --   INR  --   < > 1.07  < > = values in this interval not displayed.  Assessment/Plan: Mr. WHaginsis a 71y.o. 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  Recommendations: Continue Prednisone with taper 30 mg x 7 days, 25 mg x 7 days, 20 mg x 7 days, 15 mg x 7 days and 10 mg x  7 days. Consideration for Entyvio for treatment of UC as oupatient.   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 INR not therapeutic.  Confirm with [facility] clinic appointment prior to discharge.   Please call with questions or concerns.  [place], MD

## 2016-11-01 NOTE — Progress Notes (Addendum)
ANTICOAGULATION CONSULT NOTE -follow up [place] for heparin drip AND  Warfarin Indication: thrombosis of proximal inferior mesenteric vein  Allergies  Allergen Reactions  . Codeine Nausea And Vomiting and Other (See Comments)    Patient Measurements: Height: 6' (182.9 cm) Weight: 182 lb (82.6 kg) IBW/kg (Calculated) : 77.6 Heparin Dosing Weight: 79.4 kg  Vital Signs: Temp: 97.8 F (36.6 C) (01/21 0732) Temp Source: Oral (01/21 0732) BP: 128/62 (01/21 0732) Pulse Rate: 77 (01/21 0732)  Labs:  Recent Labs  10/29/16 1404  10/30/16 0819 10/31/16 0616 10/31/16 1053 10/31/16 1524 11/01/16 0103 11/01/16 0606 11/01/16 0933  HGB  --   < > 9.5* 8.9*  --   --   --  9.4*  --   HCT  --   --  27.5* 25.3*  --   --   --  26.9*  --   PLT  --   --  211 232  --   --   --  292  --   LABPROT  --   --   --   --  13.8  --   --  13.9  --   INR  --   --   --   --  1.06  --   --  1.07  --   HEPARINUNFRC 0.27*  < > 0.40 0.21*  --  0.17* 0.54  --  0.85*  CREATININE 0.74  --  0.76  --   --   --   --   --   --   < > = values in this interval not displayed.  Estimated Creatinine Clearance: 94.3 mL/min (by C-G formula based on SCr of 0.76 mg/dL).   Medical History: Past Medical History:  Diagnosis Date  . Anemia   . CAD (coronary artery disease) 07/08/2011   Overview:  a. 07/2006: Anterior ST elevation MI. b. 07/2006: PCI with BMS to LAD and RCA. c. 09/2006: Non ST elevation. d. LVEF 30%.  Discussed ICD, not currently interested.   . 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 Dyslipidemia 07/30/2011   Overview:  High triglycerides  . GERD (gastroesophageal reflux disease) 07/08/2011  . History of Clostridium difficile colitis   . History of myocardial infarction   . Hyperlipidemia, unspecified 07/08/2011  . Hypothyroidism   . 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 Type II diabetes mellitus, uncontrolled ([place]) 07/08/2011   . Vitamin D deficiency     Medications:  Infusions:  . heparin 1,500 Units/hr (11/01/16 0803)    Assessment: 70 yom cc fever and cough. Sepsis with UC, on abx. 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 No AC on PTA list. Some recent bleeding related to UC, Hgb 10.9 on 1/15. Continue to follow Hgb.   Date  INR  Dose 1/20  1.06  5 mg 1/21  1.07   Goal of Therapy:  Heparin level 0.3-0.7 units/ml Monitor platelets by anticoagulation protocol: Yes   Plan:  Heparin Drip:   HL=0.27 slightly subtherapeutic. Due to pt nose bleeds and blood in diarrhea will not bolus but will increase rate to 1150 units/hr. Recheck in 8 hours.  1/18 2328 HL therapeutic x 1. Continue current rate. Will recheck HL in 8 hours. NAC  1/19 HL =0.40 therapeutic x2. Recheck HL in the AM along with a CBC.  1/20 0616 HL subtherapeutic x 1. RN reports infusion is still running appropriately. Patient has had  no nosebleeds or blood in stool on her shift, but in report they noted blood streaked stool. Will give 600 units IV x 1 bolus and increase rate to 1250 units/hr. Recheck HL in 8 hours.    1/20 HL @ 1524= 0.17.  Will give bolus of 2500 units and increase Heparin drip to 1500 units/hr. Will rechek HL in 8 hr.  1/21 0103 HL therapeutic x 1. Continue current rate. Recheck HL in 8 hours. NAC  1/21 HL@ 0933= 0.85. Will decrease Heparin drip to 1350 units/hr and recheck in 8 hrs.  WARFARIN: 1/20: Will initiate warfarin 68m PO x1. Will monitor INR closely as patient is on Cipro and Flagyl which can increase the INR. Ordered INR for tomorrow morning.  1/21 INR= 1.07 . Will give Warfarin 5 mg x1 again today. If INR does not increase >1.5 tomorrow then anticipate giving 7.568mthen. Patient on CIPRO and METRONIDAZOLE- significant drug interactions with Warfarin.   Jammi Morrissette A, PharmD 11/01/2016,10:56 AM

## 2016-11-01 NOTE — Progress Notes (Signed)
MEDICATION RELATED CONSULT NOTE - INITIAL   Pharmacy Consult for electrolyte monitoring and replacement Indication: hypokalemia/VTach  Allergies  Allergen Reactions  . Codeine Nausea And Vomiting and Other (See Comments)    Patient Measurements: Height: 6' (182.9 cm) Weight: 182 lb (82.6 kg) IBW/kg (Calculated) : 77.6 Adjusted Body Weight:   Labs:  Recent Labs  10/29/16 1404 10/30/16 0819 10/31/16 0616 11/01/16 0606  WBC  --  6.4 6.1 6.2  HGB  --  9.5* 8.9* 9.4*  HCT  --  27.5* 25.3* 26.9*  PLT  --  211 232 292  CREATININE 0.74 0.76  --   --   MG 2.1 2.0 1.8 2.1  PHOS 1.9* 2.2* 2.8 3.0   Estimated Creatinine Clearance: 94.3 mL/min (by C-G formula based on SCr of 0.76 mg/dL).   Medical History: Past Medical History:  Diagnosis Date  . Anemia   . CAD (coronary artery disease) 07/08/2011   Overview:  a. 07/2006: Anterior ST elevation MI. b. 07/2006: PCI with BMS to LAD and RCA. c. 09/2006: Non ST elevation. d. LVEF 30%.  Discussed ICD, not currently interested.   . 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 Dyslipidemia 07/30/2011   Overview:  High triglycerides  . GERD (gastroesophageal reflux disease) 07/08/2011  . History of Clostridium difficile colitis   . History of myocardial infarction   . Hyperlipidemia, unspecified 07/08/2011  . Hypothyroidism   . 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 Type II diabetes mellitus, uncontrolled ([place]) 07/08/2011  . Vitamin D deficiency     Medications:  Infusions:  . heparin 1,500 Units/hr (10/31/16 2148)    Assessment: 68 yom admitted for chills with worsening diarrhea. Fever on admission, ID started meropenem. GI following for UC and diarrhea. Tonight patient had VTach and low electrolytes. K 2.7, Mg 1.7, phos 1.6, Ca 7.2 corrected to 8.5.   Goal of Therapy:  Electrolytes WNL  Plan:  K=3.5, Mg=1.8, phos=2.8 Will give Magnesium 2 gram IV x 1 as Mag is borderline  and trending down.  1/21 0606 Electrolytes WNL. Recheck tomorrow with AM labs.  Laural Benes, Pharm.D., BCPS Clinical pharmacist 11/01/2016,7:03 AM

## 2016-11-01 NOTE — Consult Note (Signed)
[facility] Consult Note  MRN : [id number]  David [name] is a 71 y.o. (01-Sep-1946) male who presents with chief complaint of  Chief Complaint  Patient presents with  . Fever  . Cough  .  History of Present Illness: I am asked to evaluate the patient by Dr Anselm Jungling for anticoagulation in a patient with mesenteric vein thrombosis.  The patient   is an 71 y.o. male who presented to [facility] approximately 3 days ago with the chief complaint of fever and chills.  The patient has a past medical history of ulcerative colitis diagnosed 10 years ago.  He presented to the emergency department after feeling unable to cope with chills and subjective fever. Upon arrival the patient was febrile to 103.63F. He noted to me he has now been admitted three times since [facility] for exacerbation of his UC.  He states that he felt mild improvement since his last hospitalization but developed worsening diarrhea a few days prior to admission. The holiday weekend prevented him from getting oral antibiotics until yesterday. Since that time he has had some painful watery bowel movements. He denies seeing any blood in his stool. CT of the abdomen showed inflammation of the transverse colon as well as changes consistent with thrombosis of the inferior mesenteric vein. The surgery service was consulted at the time of admission who recommended initiation of heparin.  Overall the patient states he feels significantly better since admission but is not yet "back to normal".  There is no history of lower extremity DVT. No family history of hypercoagulability.  He denies SOB or pleuritic chest pain.  No history of PE.   Current Facility-Administered Medications  Medication Dose Route Frequency Provider Last Rate Last Dose  . acetaminophen (TYLENOL) tablet 650 mg  650 mg Oral Q6H PRN Harrie Foreman, MD   650 mg at 10/28/16 0349   Or  . acetaminophen (TYLENOL) suppository 650 mg  650 mg  Rectal Q6H PRN Harrie Foreman, MD      . aspirin EC tablet 81 mg  81 mg Oral BID Harrie Foreman, MD   81 mg at 10/31/16 2152  . atorvastatin (LIPITOR) tablet 40 mg  40 mg Oral Daily Harrie Foreman, MD   40 mg at 10/31/16 0814  . carvedilol (COREG) tablet 3.125 mg  3.125 mg Oral BID WC Harrie Foreman, MD   3.125 mg at 11/01/16 0800  . cholecalciferol (VITAMIN D) tablet 2,000 Units  2,000 Units Oral Daily Harrie Foreman, MD   2,000 Units at 10/31/16 [phone]  . ciprofloxacin (CIPRO) tablet 500 mg  500 mg Oral BID Leonel Ramsay, MD   500 mg at 11/01/16 5631  . feeding supplement (ENSURE ENLIVE) (ENSURE ENLIVE) liquid 237 mL  237 mL Oral TID BM Vaughan Basta, MD   237 mL at 10/31/16 2000  . heparin ADULT infusion 100 units/mL (25000 units/246m sodium chloride 0.45%)  1,350 Units/hr Intravenous Continuous RGregor Hams MD 15 mL/hr at 11/01/16 0803 1,500 Units/hr at 11/01/16 0803  . insulin aspart (novoLOG) injection 0-9 Units  0-9 Units Subcutaneous TID WC VVaughan Basta MD   2 Units at 11/01/16 0801  . levothyroxine (SYNTHROID, LEVOTHROID) tablet 50 mcg  50 mcg Oral QAC breakfast MHarrie Foreman MD   50 mcg at 11/01/16 0800  . metroNIDAZOLE (FLAGYL) tablet 500 mg  500 mg Oral Q12H DLeonel Ramsay MD   500 mg at 10/31/16 2152  . ondansetron (  ZOFRAN) tablet 4 mg  4 mg Oral Q6H PRN Harrie Foreman, MD       Or  . ondansetron [facility]) injection 4 mg  4 mg Intravenous Q6H PRN Harrie Foreman, MD      . pantoprazole (PROTONIX) EC tablet 40 mg  40 mg Oral QAC breakfast Harrie Foreman, MD   40 mg at 11/01/16 0801  . ramipril (ALTACE) capsule 5 mg  5 mg Oral Daily Harrie Foreman, MD   5 mg at 10/31/16 0831  . sodium chloride flush (NS) 0.9 % injection 3 mL  3 mL Intravenous Q12H Harrie Foreman, MD   3 mL at 10/31/16 2152  . warfarin (COUMADIN) tablet 10 mg  10 mg Oral ONCE-1800 Vaughan Basta, MD      . Warfarin - Pharmacist Dosing Inpatient   Does  not apply q1800 Loree Fee, RPH      . zolpidem (AMBIEN) tablet 5 mg  5 mg Oral QHS PRN Saundra Shelling, MD   5 mg at 11/01/16 0007    Past Medical History:  Diagnosis Date  . Anemia   . CAD (coronary artery disease) 07/08/2011   Overview:  a. 07/2006: Anterior ST elevation MI. b. 07/2006: PCI with BMS to LAD and RCA. c. 09/2006: Non ST elevation. d. LVEF 30%.  Discussed ICD, not currently interested.   . 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 Dyslipidemia 07/30/2011   Overview:  High triglycerides  . GERD (gastroesophageal reflux disease) 07/08/2011  . History of Clostridium difficile colitis   . History of myocardial infarction   . Hyperlipidemia, unspecified 07/08/2011  . Hypothyroidism   . 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 Type II diabetes mellitus, uncontrolled ([place]) 07/08/2011  . Vitamin D deficiency     Past Surgical History:  Procedure Laterality Date  . CARDIAC CATHETERIZATION    . CHOLECYSTECTOMY    . COLONOSCOPY  07/20/2006   Crohn's disease  . CORONARY STENT PLACEMENT    . FLEXIBLE SIGMOIDOSCOPY N/A 09/25/2016   Procedure: FLEXIBLE SIGMOIDOSCOPY;  Surgeon: [name], MD;  Location: [facility] ENDOSCOPY;  Service: Endoscopy;  Laterality: N/A;  . MELANOMA REMOVED    . parotid gland removal      Social History Social History  Substance Use Topics  . Smoking status: Never Smoker  . Smokeless tobacco: Never Used  . Alcohol use No    Family History Family History  Problem Relation Age of Onset  . Heart disease Mother   . Heart attack Father   . Heart disease Sister   . Heart disease Brother   No family history of bleeding/clotting disorders, porphyria or autoimmune disease   Allergies  Allergen Reactions  . Codeine Nausea And Vomiting and Other (See Comments)     REVIEW OF SYSTEMS (Negative unless checked)  Constitutional: [] Weight loss  [] Fever  [] Chills Cardiac: [] Chest pain   [] Chest  pressure   [] Palpitations   [] Shortness of breath when laying flat   [] Shortness of breath at rest   [] Shortness of breath with exertion. Vascular:  [] Pain in legs with walking   [] Pain in legs at rest   [] Pain in legs when laying flat   [] Claudication   [] Pain in feet when walking  [] Pain in feet at rest  [] Pain in feet when laying flat   [] History of DVT   [] Phlebitis   [] Swelling in legs   [] Varicose veins   [] Non-healing ulcers Pulmonary:   [] Uses home  oxygen   [] Productive cough   [] Hemoptysis   [] Wheeze  [] COPD   [] Asthma Neurologic:  [] Dizziness  [] Blackouts   [] Seizures   [] History of stroke   [] History of TIA  [] Aphasia   [] Temporary blindness   [] Dysphagia   [] Weakness or numbness in arms   [] Weakness or numbness in legs Musculoskeletal:  [] Arthritis   [] Joint swelling   [] Joint pain   [] Low back pain Hematologic:  [] Easy bruising  [] Easy bleeding   [] Hypercoagulable state   [] Anemic  [] Hepatitis Gastrointestinal:  [x] Blood in stool   [] Vomiting blood  [x] Abdominal Pain  [] Difficulty swallowing. Genitourinary:  [] Chronic kidney disease   [] Difficult urination  [] Frequent urination  [] Burning with urination   [] Blood in urine Skin:  [] Rashes   [] Ulcers   [] Wounds Psychological:  [] History of anxiety   []  History of major depression.  Physical Examination  Vitals:   10/31/16 2205 11/01/16 0351 11/01/16 0600 11/01/16 0732  BP: 130/61 122/61  128/62  Pulse: 79 89  77  Resp: 20 20    Temp: 98.2 F (36.8 C) 97.5 F (36.4 C)  97.8 F (36.6 C)  TempSrc: Oral Oral  Oral  SpO2: 96% 100%  98%  Weight:   82.6 kg (182 lb)   Height:   6' (1.829 m)    Body mass index is 24.68 kg/m. Gen:  WD/WN, NAD Head: [place]/AT, No temporalis wasting. Prominent temp pulse not noted. Ear/Nose/Throat: Hearing grossly intact, nares w/o erythema or drainage, oropharynx w/o Erythema/Exudate Eyes: Sclera non-icteric, conjunctiva clear Neck: Trachea midline.  No JVD.  Pulmonary:  Good air movement, respirations  not labored, equal bilaterally.  Cardiac: RRR with frequent PVC, normal S1, S2. Vascular: feet pink and warm Vessel Right Left  Radial Palpable Palpable  Ulnar Palpable Palpable  Brachial Palpable Palpable  Carotid Palpable, without bruit Palpable, without bruit  Aorta Not palpable N/A  Femoral Palpable Palpable  Popliteal Palpable Palpable  PT Trace Palpable Trace Palpable  DP Trace Palpable Trace Palpable   Gastrointestinal: soft,  Moderate tenderness with deep palpation  Mild distended. Not guarding.  Musculoskeletal: M/S 5/5 throughout.  Extremities without ischemic changes.  No deformity or atrophy. No edema. Neurologic: Sensation grossly intact in extremities.  Symmetrical.  Speech is fluent. Motor exam as listed above. Psychiatric: Judgment intact, Mood & affect appropriate for pt's clinical situation. Dermatologic: No rashes or ulcers noted.  No cellulitis or open wounds. Lymph : No Cervical, Axillary, or Inguinal lymphadenopathy.      CBC Lab Results  Component Value Date   WBC 6.2 11/01/2016   HGB 9.4 (L) 11/01/2016   HCT 26.9 (L) 11/01/2016   MCV 89.4 11/01/2016   PLT 292 11/01/2016    BMET    Component Value Date/Time   NA 138 10/30/2016 0819   K 4.4 11/01/2016 0606   CL 111 10/30/2016 0819   CO2 22 10/30/2016 0819   GLUCOSE 143 (H) 10/30/2016 0819   BUN 5 (L) 10/30/2016 0819   CREATININE 0.76 10/30/2016 0819   CALCIUM 7.4 (L) 10/30/2016 0819   GFRNONAA >60 10/30/2016 0819   GFRAA >60 10/30/2016 0819   Estimated Creatinine Clearance: 94.3 mL/min (by C-G formula based on SCr of 0.76 mg/dL).  COAG Lab Results  Component Value Date   INR 1.07 11/01/2016   INR 1.06 10/31/2016   INR 1.11 10/26/2016    Radiology Dg Chest 2 View  Result Date: 10/26/2016 CLINICAL DATA:  Cough for 2 weeks with fever today. History of ulcerative colitis. EXAM:  CHEST  2 VIEW COMPARISON:  Acute abdominal series done 09/29/2016. FINDINGS: The heart size and mediastinal  contours are stable. There is a probable coronary artery stent. The lungs are clear. There is no pleural effusion or pneumothorax. Probable old rib fracture laterally on the right. No acute osseous findings are seen. IMPRESSION: No active cardiopulmonary process. Electronically Signed   By: Richardean Sale M.D.   On: 10/26/2016 18:43   Ct Abdomen Pelvis W Contrast  Result Date: 10/26/2016 CLINICAL DATA:  Malaise, chills. History of ulcerative colitis and has had a flare for the past 3-4 weeks. Patient is on steroids. EXAM: CT ABDOMEN AND PELVIS WITH CONTRAST TECHNIQUE: Multidetector CT imaging of the abdomen and pelvis was performed using the standard protocol following bolus administration of intravenous contrast. CONTRAST:  154m ISOVUE-300 IOPAMIDOL (ISOVUE-300) INJECTION 61% COMPARISON:  09/28/2016 CT FINDINGS: Lower chest: Small hiatal hernia. Top normal-sized cardiac chambers with coronary arteriosclerosis. Bibasilar atelectasis. Minimal chronic right middle lobe scarring. No effusion or pneumothorax. Stable 7 mm right lower lobe lateral nodule. Hepatobiliary: Cholecystectomy with mild intrahepatic ductal dilatation which can be seen in the setting prior cholecystectomy. No choledocholithiasis. No space-occupying mass of the liver. Pancreas: Unremarkable. No pancreatic ductal dilatation or surrounding inflammatory changes. Spleen: Normal in size without focal abnormality. Adrenals/Urinary Tract: Adrenal glands are unremarkable. Kidneys are normal, without renal calculi, focal lesion, or hydronephrosis. Bladder is unremarkable. Stomach/Bowel: Stomach is moderately distended with contrast. Normal small bowel rotation. No small bowel dilatation to suggest obstruction. Normal-appearing appendix. Transmural thickening of large bowel mid transverse colon through rectum consistent with mild colitis. Vascular/Lymphatic: Acute appearing thrombosis of the proximal inferior mesenteric vein with distention noted along  its proximal half draining the sigmoid and rectosigmoid veins terminating at the left common vein, series 5 images 51 through 68. There is mesenteric fat stranding associated with this finding. No lymphadenopathy. Reproductive: Mild prostatomegaly with peripheral zone calcifications. Other: Tiny fat containing umbilical hernia. No abdominopelvic ascites. Musculoskeletal: Degenerative changes of the lumbar spine appear chronic. No acute osseous abnormality. IMPRESSION: 1. 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 2. Diffuse transmural thickening of proximal transverse colon through rectum consistent with colitis. 3. Otherwise chronic stable appearing changes as above. Electronically Signed   By: DAshley RoyaltyM.D.   On: 10/26/2016 23:15      Assessment/Plan   1. Mesenteric ischemia: This is most likely secondary to the inflammation from his UC and his infection.  I do not believe that he has an intrinsic hypercoagulable condition rather he is in a hypercoagulable state secondary to inflammation.  I would continue heparin since his colitis is still a problem and if he were to propagate he could thrombose the portal vein which would have dire consequences.  His Hgb has been stable on heparin.  Given that he wishes to go to [facility] I would convert him to oral anticoagulation and recommend Coumadin.  I would hope that primary care would manage this and would try to keep his INR between 1.75 and 2.5 given the blood that has been noted in his stool (understandable with his UC exacerbation)     2.  E.coli Sepsis: present on admission - His bcx growing E.coli, C dff is negative.  - ID following  - Antibiotics changed appropriately. - tolerated CLD - advance to FLD- and now regular.  3. Ulcerative colitis: The patient had been in remission for the last 10 years after receiving Remicade. He has been reluctant to receive another treatment with his  more recent flares due to concerns regarding  malignancy associated with immunotherapy.  - Continue antibiotics- after 24-48 hours of antibiotics - GI started him on entocort which has fewer systemic side effects than prednisone.    Pt want to have a trial of that before considering the strongly suggested option of having colectomy ( by both surgery and GI), he says he will like to have some remission and then follow at GI in [place].  4. Coronary artery disease: Stable. Continue aspirin  5. Hypertension: Controlled; continue carvedilol and ACE inhibitor  6. Hyperlipidemia: Continue statin therapy  7. Hypothyroidism: Continue Synthroid.    Hortencia Pilar, MD  11/01/2016 11:10 AM    This note was created with Dragon medical transcription system.  Any error is purely unintentional

## 2016-11-01 NOTE — Progress Notes (Signed)
[facility] NAME: Deacon Gadbois    MR#:  [id number]  DATE OF BIRTH:  1945-11-14  SUBJECTIVE:  CHIEF COMPLAINT:   Chief Complaint  Patient presents with  . Fever  . Cough   No new complains, had one BM with some blood. No active bleed. Tolerating diet well.  REVIEW OF SYSTEMS:  Review of Systems  Constitutional: Positive for malaise/fatigue. Negative for weight loss.  HENT: Negative for nosebleeds and sore throat.   Eyes: Negative for blurred vision.  Respiratory: Negative for cough, shortness of breath and wheezing.   Cardiovascular: Negative for chest pain, orthopnea, leg swelling and PND.  Gastrointestinal: Negative for abdominal pain, blood in stool, constipation, diarrhea, heartburn, nausea and vomiting.  Genitourinary: Negative for dysuria and urgency.  Musculoskeletal: Negative for back pain.  Skin: Negative for rash.  Neurological: Positive for weakness. Negative for dizziness, speech change, focal weakness and headaches.  Endo/Heme/Allergies: Does not bruise/bleed easily.  Psychiatric/Behavioral: Negative for depression.   DRUG ALLERGIES:   Allergies  Allergen Reactions  . Codeine Nausea And Vomiting and Other (See Comments)   VITALS:  Blood pressure (!) 107/53, pulse 79, temperature 97.7 F (36.5 C), temperature source Oral, resp. rate 18, height 6' (1.829 m), weight 82.6 kg (182 lb), SpO2 98 %. PHYSICAL EXAMINATION:  Physical Exam  Constitutional: He is oriented to person, place, and time and well-developed, well-nourished, and in no distress.  HENT:  Head: Normocephalic and atraumatic.  Eyes: Conjunctivae and EOM are normal. Pupils are equal, round, and reactive to light.  Neck: Normal range of motion. Neck supple. No tracheal deviation present. No thyromegaly present.  Cardiovascular: Normal rate, regular rhythm and normal heart sounds.   Pulmonary/Chest: Effort normal and breath sounds normal. No  respiratory distress. He has no wheezes. He exhibits no tenderness.  Abdominal: Soft. Bowel sounds are normal. He exhibits no distension. There is no tenderness.  Musculoskeletal: Normal range of motion.  Neurological: He is alert and oriented to person, place, and time. No cranial nerve deficit.  Skin: Skin is warm and dry. No rash noted.  Psychiatric: Mood and affect normal.   LABORATORY PANEL:   CBC  Recent Labs Lab 11/01/16 0606  WBC 6.2  HGB 9.4*  HCT 26.9*  PLT 292   ------------------------------------------------------------------------------------------------------------------ Chemistries   Recent Labs Lab 10/26/16 1811  10/30/16 0819  11/01/16 0606  NA 132*  < > 138  --   --   K 3.8  < > 3.4*  < > 4.4  CL 98*  < > 111  --   --   CO2 24  < > 22  --   --   GLUCOSE 429*  < > 143*  --   --   BUN 16  < > 5*  --   --   CREATININE 1.12  < > 0.76  --   --   CALCIUM 8.0*  < > 7.4*  --   --   MG  --   < > 2.0  < > 2.1  AST 25  --   --   --   --   ALT 15*  --   --   --   --   ALKPHOS 121  --   --   --   --   BILITOT 0.9  --   --   --   --   < > = values in this interval not displayed. RADIOLOGY:  No results found. ASSESSMENT AND PLAN:  This is a 71 year old male admitted for sepsis secondary to colitis and mesenteric ischemia.  1. E.coli Sepsis: present on admission - His bcx growing E.coli, C dff is negative.  - Appreciate ID input  - Antibiotics changed per final culture results. cipro + flagyl. - tolerated CLD - advance to FLD- and now regular.  2. Mesenteric ischemia: continue heparin.    Vascular consult to help decide oral anticoagulants and length of therapy.   Started on coumadin, INR is subtherapeutic today.  3. Ulcerative colitis: The patient had been in remission for the last 10 years after receiving Remicade. He has been reluctant to receive another treatment with his more recent flares due to concerns regarding malignancy associated with  immunotherapy.  - Dr. Vicente Males started Entecort earlier but Dr. Epimenio Foot from GI today d/c that and suggest prednisone, I discussed with Dr. Verita Lamb about safty of prednisone- ( though he is not here on weekend, but he knows the pt and is on his case)- he said as infection is now in control- it is safe to start prednisone.   Pt want to have a trial of that before considering the strongly suggested option of having colectomy ( by both surgery and GI), he says he will like to have some remission and then follow at GI in [place].  4. Coronary artery disease: Stable. Continue aspirin 5. Hypertension: Controlled; continue carvedilol and ACE inhibitor 6. Hyperlipidemia: Continue statin therapy 7. Hypothyroidism: Continue Synthroid.  8. DVT prophylaxis: Therapeutic anticoagulation as above 9. GI prophylaxis: Pantoprazole per home regimen  - patient refusing tertiary care transfer for now and prefers to stay here   All the records are reviewed and case discussed with Care Management/Social Worker. Management plans discussed with the patient, Dr Vicente Males, Nursing and they are in agreement.  CODE STATUS: FULL CODE, Palliative care c/s  TOTAL TIME TAKING CARE OF THIS PATIENT: 35 minutes.   More than 50% of the time was spent in counseling/coordination of care: YES   POSSIBLE D/C IN 2-3 DAYS, DEPENDING ON CLINICAL CONDITION.   Vaughan Basta M.D on 11/01/2016 at 4:12 PM  Between 7am to 6pm - Pager - [phone]  After 6pm go to [email or web address] - Proofreader  [facility]  Office  [phone]  CC: Primary care physician; Madelyn Brunner, MD  Note: This dictation was prepared with Dragon dictation along with smaller phrase technology. Any transcriptional errors that result from this process are unintentional.

## 2016-11-02 DIAGNOSIS — K51911 Ulcerative colitis, unspecified with rectal bleeding: Secondary | ICD-10-CM

## 2016-11-02 DIAGNOSIS — Z515 Encounter for palliative care: Secondary | ICD-10-CM

## 2016-11-02 DIAGNOSIS — Z7189 Other specified counseling: Secondary | ICD-10-CM

## 2016-11-02 LAB — BASIC METABOLIC PANEL
ANION GAP: 5 (ref 5–15)
BUN: 8 mg/dL (ref 6–20)
CALCIUM: 8.1 mg/dL — AB (ref 8.9–10.3)
CO2: 27 mmol/L (ref 22–32)
Chloride: 107 mmol/L (ref 101–111)
Creatinine, Ser: 0.9 mg/dL (ref 0.61–1.24)
GFR calc Af Amer: >60 mL/min (ref >60)
GLUCOSE: 143 mg/dL — AB (ref 65–99)
POTASSIUM: 3.7 mmol/L (ref 3.5–5.1)
SODIUM: 139 mmol/L (ref 135–145)

## 2016-11-02 LAB — GLUCOSE, CAPILLARY
Glucose-Capillary: 136 mg/dL — ABNORMAL HIGH (ref 65–99)
Glucose-Capillary: 226 mg/dL — ABNORMAL HIGH (ref 65–99)
Glucose-Capillary: 159 mg/dL — ABNORMAL HIGH (ref 65–99)
Glucose-Capillary: 160 mg/dL — ABNORMAL HIGH (ref 65–99)

## 2016-11-02 LAB — CBC
HCT: 28.3 % — ABNORMAL LOW (ref 40.0–52.0)
Hemoglobin: 10 g/dL — ABNORMAL LOW (ref 13.0–18.0)
MCH: 31.3 pg (ref 26.0–34.0)
MCHC: 35.4 g/dL (ref 32.0–36.0)
MCV: 88.4 fL (ref 80.0–100.0)
PLATELETS: 356 10*3/uL (ref 150–440)
RBC: 3.2 MIL/uL — AB (ref 4.40–5.90)
RDW: 15.2 % — AB (ref 11.5–14.5)
WBC: 6.4 10*3/uL (ref 3.8–10.6)

## 2016-11-02 LAB — PROTIME-INR
INR: 1.26
Prothrombin Time: 15.9 seconds — ABNORMAL HIGH (ref 11.4–15.2)

## 2016-11-02 LAB — CULTURE, BLOOD (ROUTINE X 2)

## 2016-11-02 LAB — HEPARIN LEVEL (UNFRACTIONATED)
HEPARIN UNFRACTIONATED: 0.52 [IU]/mL (ref 0.30–0.70)
HEPARIN UNFRACTIONATED: 0.49 [IU]/mL (ref 0.30–0.70)

## 2016-11-02 LAB — PHOSPHORUS: Phosphorus: 3.2 mg/dL (ref 2.5–4.6)

## 2016-11-02 LAB — MAGNESIUM: MAGNESIUM: 1.8 mg/dL (ref 1.7–2.4)

## 2016-11-02 MED ORDER — WARFARIN SODIUM 5 MG PO TABS
7.5000 mg | ORAL_TABLET | Freq: Once | ORAL | Status: AC
  Administered 2016-11-02: 7.5 mg via ORAL
  Filled 2016-11-02: qty 2

## 2016-11-02 MED ORDER — INSULIN ASPART 100 UNIT/ML ~~LOC~~ SOLN
0.0000 [IU] | Freq: Three times a day (TID) | SUBCUTANEOUS | Status: DC
  Administered 2016-11-02: 2 [IU] via SUBCUTANEOUS
  Administered 2016-11-03: 4 [IU] via SUBCUTANEOUS
  Administered 2016-11-03: 2 [IU] via SUBCUTANEOUS
  Administered 2016-11-03: 4 [IU] via SUBCUTANEOUS
  Administered 2016-11-04: 7 [IU] via SUBCUTANEOUS
  Administered 2016-11-04: 2 [IU] via SUBCUTANEOUS
  Filled 2016-11-02: qty 7
  Filled 2016-11-02 (×2): qty 2
  Filled 2016-11-02: qty 4
  Filled 2016-11-02: qty 2
  Filled 2016-11-02: qty 4

## 2016-11-02 MED ORDER — INSULIN ASPART 100 UNIT/ML ~~LOC~~ SOLN
0.0000 [IU] | Freq: Every day | SUBCUTANEOUS | Status: DC
  Administered 2016-11-03: 3 [IU] via SUBCUTANEOUS
  Filled 2016-11-02: qty 3

## 2016-11-02 NOTE — Consult Note (Signed)
Consultation Note Date: 11/02/2016   Patient Name: David [name]  DOB: [birth year]  MRN: [id number]  Age / Sex: 71 y.o., male  PCP: Madelyn Brunner, MD Referring Physician: Vaughan Basta, MD  Reason for Consultation: Establishing goals of care and Psychosocial/spiritual support  HPI/Patient Profile: 71 y.o. male   admitted on 10/26/2016 past medical history of ulcerative colitis and melanoma  presents to the emergency department with chills and  fever. Upon arrival the patient was febrile to 103.47F.   He states that he felt mild improvement since his last hospitalization but developed worsening diarrhea a few days prior to admission.   CT of the abdomen showed inflammation of the transverse colon as well as a clot in the inferior mesenteric vein.   The surgery service was consulted who recommended initiation of heparin, patient is admitted.   Clinical Assessment and Goals of Care:   This NP Wadie Lessen reviewed medical records, received report from team, assessed the patient and then meet at the patient's bedside along with his wife to discuss diagnosis,  GOC,  and option and role of palliative medicine in a holistic patient centered care plan.  Today Mr Stanzione speaks to his frustration with  His current medical situation, it is being suggested that a colectomy is the "next step"  He tells me "I want to try other therapies before resorted ing to surgery"  He hopes to get an appointment with a Dr Redmond Pulling at [facility], he was treated at [facility] in the past.  He currently is under the care of Dr Vicente Males who I helping him with treatment options and providers referrals.   A discussion was had today regarding advanced directives.      Values and goals of care important to patient and family were attempted to be elicited.    Questions and concerns addressed.   Family encouraged to call with questions or  concerns.  PMT will continue to support holistically.   SUMMARY OF RECOMMENDATIONS    Code Status/Advance Care Planning:   Full code Encouraged to consider and document advanced directives   Additional Recommendations (Limitations, Scope, Preferences):  Patient is hopeful for treatments that will prolong quality of life.  He struggles with insurance limitations, as to where and who he can see for his chronic UC.  He had physicians at [facility] but he tells me his insurance has created [place] for care at [facility].      Psycho-social/Spiritual:   Desire for further Chaplaincy support:no  Prognosis:   Unable to determine  Discharge Planning: Home with Home Health      Primary Diagnoses: Present on Admission: . Sepsis ([place]) . 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 Elevated lactic acid level . Fever . Ulcerative colitis ([place])   I have reviewed the medical record, interviewed the patient and family, and examined the patient. The following aspects are pertinent.  Past Medical History:  Diagnosis Date  . Anemia   . CAD (coronary artery disease) 07/08/2011   Overview:  a. 07/2006: Anterior  ST elevation MI. b. 07/2006: PCI with BMS to LAD and RCA. c. 09/2006: Non ST elevation. d. LVEF 30%.  Discussed ICD, not currently interested.   . 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 ([place]) 01/28/3789  . Dyslipidemia 07/30/2011   Overview:  High triglycerides  . GERD (gastroesophageal reflux disease) 07/08/2011  . History of Clostridium difficile colitis   . History of myocardial infarction   . Hyperlipidemia, unspecified 07/08/2011  . Hypothyroidism   . 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 Type II diabetes mellitus, uncontrolled ([place]) 07/08/2011  . Vitamin D deficiency    Social History   Social History  . Marital status: Married    Spouse name: N/A  . Number of children: N/A  . Years of education: N/A   Social History Main Topics  .  Smoking status: Never Smoker  . Smokeless tobacco: Never Used  . Alcohol use No  . Drug use: No  . Sexual activity: No   Other Topics Concern  . None   Social History Narrative  . None   Family History  Problem Relation Age of Onset  . Heart disease Mother   . Heart attack Father   . Heart disease Sister   . Heart disease Brother    Scheduled Meds: . aspirin EC  81 mg Oral BID  . atorvastatin  40 mg Oral Daily  . carvedilol  3.125 mg Oral BID WC  . cholecalciferol  2,000 Units Oral Daily  . ciprofloxacin  500 mg Oral BID  . feeding supplement (ENSURE ENLIVE)  237 mL Oral TID BM  . insulin aspart  0-9 Units Subcutaneous TID WC  . levothyroxine  50 mcg Oral QAC breakfast  . metroNIDAZOLE  500 mg Oral Q12H  . pantoprazole  40 mg Oral QAC breakfast  . ramipril  5 mg Oral Daily  . sodium chloride flush  3 mL Intravenous Q12H  . warfarin  7.5 mg Oral ONCE-1800  . Warfarin - Pharmacist Dosing Inpatient   Does not apply q1800   Continuous Infusions: . heparin 1,150 Units/hr (11/01/16 2000)   PRN Meds:.acetaminophen **OR** acetaminophen, ondansetron **OR** ondansetron (ZOFRAN) IV, zolpidem Medications Prior to Admission:  Prior to Admission medications   Medication Sig Start Date End Date Taking? Authorizing Provider  ACCU-CHEK AVIVA PLUS test strip 1 each by Other route as needed for other.  09/01/16  Yes Historical Provider, MD  aspirin EC 81 MG tablet Take 81 mg by mouth 2 (two) times daily.    Yes Historical Provider, MD  atorvastatin (LIPITOR) 40 MG tablet Take 40 mg by mouth daily.  05/21/16  Yes Historical Provider, MD  benzonatate (TESSALON) 100 MG capsule Take 100 mg by mouth 3 (three) times daily as needed for cough. 10/26/16 11/05/16 Yes Historical Provider, MD  carvedilol (COREG) 3.125 MG tablet Take 3.125 mg by mouth 2 (two) times daily with a meal.  05/21/16  Yes Historical Provider, MD  cefUROXime (CEFTIN) 250 MG tablet Take 250 mg by mouth 2 (two) times daily.  10/26/16 11/05/16 Yes Historical Provider, MD  Cholecalciferol (VITAMIN D) 2000 units CAPS Take 1 capsule by mouth daily.  08/01/09  Yes Historical Provider, MD  glipiZIDE (GLUCOTROL) 10 MG tablet Take 10 mg by mouth 2 (two) times daily.  05/21/16  Yes Historical Provider, MD  levothyroxine (SYNTHROID, LEVOTHROID) 50 MCG tablet Take 50 mcg by mouth daily before breakfast.  05/21/16  Yes Historical Provider, MD  Multiple Vitamins-Minerals (MULTIVITAMIN ADULT PO) Take 1 tablet by  mouth daily.  06/25/08  Yes Historical Provider, MD  Omega-3 Fatty Acids (FISH OIL PO) Take 1 capsule by mouth 2 (two) times daily.  06/25/08  Yes Historical Provider, MD  omeprazole (PRILOSEC) 20 MG capsule Take 20 mg by mouth daily.  05/21/16  Yes Historical Provider, MD  ramipril (ALTACE) 5 MG capsule Take 5 mg by mouth daily.  05/21/16  Yes Historical Provider, MD   Allergies  Allergen Reactions  . Codeine Nausea And Vomiting and Other (See Comments)   Review of Systems  Constitutional: Positive for fatigue.  Neurological: Positive for weakness.    Physical Exam  Constitutional: He is oriented to person, place, and time. He appears ill.  Cardiovascular: Normal rate, regular rhythm and normal heart sounds.   Pulmonary/Chest: Effort normal and breath sounds normal.  Neurological: He is alert and oriented to person, place, and time.  Skin: Skin is warm and dry.    Vital Signs: BP 115/75 (BP Location: Left Arm)   Pulse 88   Temp 97.7 F (36.5 C) (Oral)   Resp 16   Ht 6' (1.829 m)   Wt 80.9 kg (178 lb 6.4 oz)   SpO2 100%   BMI 24.20 kg/m  Pain Assessment: No/denies pain POSS *See Group Information*: 1-Acceptable,Awake and alert Pain Score: 0-No pain   SpO2: SpO2: 100 % O2 Device:SpO2: 100 % O2 Flow Rate: .O2 Flow Rate (L/min): 0 L/min  IO: Intake/output summary:  Intake/Output Summary (Last 24 hours) at 11/02/16 1435 Last data filed at 11/02/16 1028  Gross per 24 hour  Intake            645.5 ml    Output             1600 ml  Net           -954.5 ml    LBM: Last BM Date: 11/02/16 Baseline Weight: Weight: 79.4 kg (175 lb) Most recent weight: Weight: 80.9 kg (178 lb 6.4 oz)      Palliative Assessment/Data:  50%   Flowsheet Rows   Flowsheet Row Most Recent Value  Intake Tab  Referral Department  Hospitalist  Unit at Time of Referral  Med/Surg Unit  Palliative Care Primary Diagnosis  Sepsis/Infectious Disease  Date Notified  10/29/16  Palliative Care Type  New Palliative care  Reason for referral  Clarify Goals of Care  Date of Admission  10/26/16  # of days IP prior to Palliative referral  3  Clinical Assessment  Psychosocial & Spiritual Assessment  Palliative Care Outcomes     Discussed with Dr Anselm Jungling  Time In: 1245 Time Out: 1400 Time Total: 75 min Greater than 50%  of this time was spent counseling and coordinating care related to the above assessment and plan.  Signed by: Wadie Lessen, NP   Please contact Palliative Medicine Team phone at [phone] for questions and concerns.  For individual provider: See Shea Evans

## 2016-11-02 NOTE — Progress Notes (Addendum)
Inpatient Diabetes Program Recommendations  AACE/ADA: New Consensus Statement on Inpatient Glycemic Control (2015)  Target Ranges:  Prepandial:   less than 140 mg/dL      Peak postprandial:   less than 180 mg/dL (1-2 hours)      Critically ill patients:  140 - 180 mg/dL   Results for MIKAI, MEINTS (MRN [id number]) as of 11/02/2016 09:35  Ref. Range 11/01/2016 07:29 11/01/2016 11:23 11/01/2016 16:34 11/01/2016 20:59 11/02/2016 07:55  Glucose-Capillary Latest Ref Range: 65 - 99 mg/dL 200 (H) 257 (H) 210 (H) 164 (H) 136 (H)   Review of Glycemic Control  Diabetes history: DM2 Outpatient Diabetes medications: Glipizide 10 mg BID Current orders for Inpatient glycemic control: Novolog 0-9 units TID with meals  Inpatient Diabetes Program Recommendations: Correction (SSI): Please consider ordering Novolog bedtime correction scale. Insulin - Meal Coverage: If patient eats at least 50% of meal and post prandial glucose continues to be elevated, please consider ordering Novolog 3 units TID with meals for meal coverage.  Thanks, Barnie Alderman, RN, MSN, CDE Diabetes Coordinator Inpatient Diabetes Program [phone] (Team Pager from 8am to 5pm)

## 2016-11-02 NOTE — Progress Notes (Signed)
ANTICOAGULATION CONSULT NOTE -follow up [facility] for heparin drip AND  Warfarin Indication: thrombosis of proximal inferior mesenteric vein      Allergies  Allergen Reactions  . Codeine Nausea And Vomiting and Other (See Comments)    Patient Measurements: Height: 6' (182.9 cm) Weight: 182 lb (82.6 kg) IBW/kg (Calculated) : 77.6 Heparin Dosing Weight: 79.4 kg  Vital Signs: Temp: 97.8 F (36.6 C) (01/21 0732) Temp Source: Oral (01/21 0732) BP: 128/62 (01/21 0732) Pulse Rate: 77 (01/21 0732)  Labs:  Recent Labs (last 2 labs)    Recent Labs  10/29/16 1404  10/30/16 0819 10/31/16 0616 10/31/16 1053 10/31/16 1524 11/01/16 0103 11/01/16 0606 11/01/16 0933  HGB  --   < > 9.5* 8.9*  --   --   --  9.4*  --   HCT  --   --  27.5* 25.3*  --   --   --  26.9*  --   PLT  --   --  211 232  --   --   --  292  --   LABPROT  --   --   --   --  13.8  --   --  13.9  --   INR  --   --   --   --  1.06  --   --  1.07  --   HEPARINUNFRC 0.27*  < > 0.40 0.21*  --  0.17* 0.54  --  0.85*  CREATININE 0.74  --  0.76  --   --   --   --   --   --   < > = values in this interval not displayed.    Estimated Creatinine Clearance: 94.3 mL/min (by C-G formula based on SCr of 0.76 mg/dL).   Medical History:     Past Medical History:  Diagnosis Date  . Anemia   . CAD (coronary artery disease) 07/08/2011   Overview:  a. 07/2006: Anterior ST elevation MI. b. 07/2006: PCI with BMS to LAD and RCA. c. 09/2006: Non ST elevation. d. LVEF 30%.  Discussed ICD, not currently interested.   . 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 ([place]) 04/07/349  . Dyslipidemia 07/30/2011   Overview:  High triglycerides  . GERD (gastroesophageal reflux disease) 07/08/2011  . History of Clostridium difficile colitis   . History of myocardial infarction   . Hyperlipidemia, unspecified 07/08/2011  . Hypothyroidism   . Hypothyroidism due to acquired atrophy of thyroid  02/15/2015  . Type II diabetes mellitus, uncontrolled ([place]) 07/08/2011  . Vitamin D deficiency     Medications:  Infusions:  . heparin 1,500 Units/hr (11/01/16 0803)    Assessment: 70 yom cc fever and cough. Sepsis with UC, on abx. 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 No AC on PTA list. Some recent bleeding related to UC, Hgb 10.9 on 1/15. Continue to follow Hgb.   Date                INR                  Dose 1/20                 1.06                 5 mg 1/21                 1.07  60m 1/22    1.26               7.511m  Goal of Therapy:  Heparin level 0.3-0.7 units/ml Monitor platelets by anticoagulation protocol: Yes   Plan:  Heparin Drip:   HL=0.27 slightly subtherapeutic. Due to pt nose bleeds and blood in diarrhea will not bolus but will increase rate to 1150 units/hr. Recheck in 8 hours.  1/18 2328 HL therapeutic x 1. Continue current rate. Will recheck HL in 8 hours. NAC  1/19 HL =0.40 therapeutic x2. Recheck HL in the AM along with a CBC.  1/20 0616 HL subtherapeutic x 1. RN reports infusion is still running appropriately. Patient has had no nosebleeds or blood in stool on her shift, but in report they noted blood streaked stool. Will give 600 units IV x 1 bolus and increase rate to 1250 units/hr. Recheck HL in 8 hours.    1/20 HL @ 1524= 0.17.  Will give bolus of 2500 units and increase Heparin drip to 1500 units/hr. Will rechek HL in 8 hr.  1/21 0103 HL therapeutic x 1. Continue current rate. Recheck HL in 8 hours. NAC  1/21 HL@ 0933= 0.85. Will decrease Heparin drip to 1350 units/hr and recheck in 8 hrs.  1/21 Hl@1825  = 0.86. Will decrease Heparin drip to 1150 units/hr and recheck in 8 hours (1/22 @ 0400).  1/22 HL @0511 : 0.52. WIll continue heparin drip at 1150 units/hr and recheck in 8 hours.   WARFARIN: 1/20: Will initiate warfarin 4m60mO x1. Will monitor INR closely as patient is on Cipro and  Flagyl which can increase the INR. Ordered INR for tomorrow morning.  1/21 INR= 1.07 . Will give Warfarin 5 mg x1 again today. If INR does not increase >1.5 tomorrow then anticipate giving 7.4mg64men. Patient on CIPRO and METRONIDAZOLE- significant drug interactions with Warfarin.  1/22 INR: 1.26. Will Give Warfarin 7.5 mg today. Should begin seeing increase in INR  Tomorrow. Patient still on Cipro and Flagyl. Will continue to monitor INR daily and dose per consult.    SheePernell DuprearmD, BCPS Clinical Pharmacist 11/02/2016 8:27 AM

## 2016-11-02 NOTE — Progress Notes (Signed)
ANTICOAGULATION CONSULT NOTE -follow up [place] for heparin drip AND  Warfarin Indication: thrombosis of proximal inferior mesenteric vein      Allergies  Allergen Reactions  . Codeine Nausea And Vomiting and Other (See Comments)    Patient Measurements: Height: 6' (182.9 cm) Weight: 182 lb (82.6 kg) IBW/kg (Calculated) : 77.6 Heparin Dosing Weight: 79.4 kg  Vital Signs: Temp: 97.8 F (36.6 C) (01/21 0732) Temp Source: Oral (01/21 0732) BP: 128/62 (01/21 0732) Pulse Rate: 77 (01/21 0732)  Labs:  Recent Labs (last 2 labs)    Recent Labs  10/29/16 1404  10/30/16 0819 10/31/16 0616 10/31/16 1053 10/31/16 1524 11/01/16 0103 11/01/16 0606 11/01/16 0933  HGB  --   < > 9.5* 8.9*  --   --   --  9.4*  --   HCT  --   --  27.5* 25.3*  --   --   --  26.9*  --   PLT  --   --  211 232  --   --   --  292  --   LABPROT  --   --   --   --  13.8  --   --  13.9  --   INR  --   --   --   --  1.06  --   --  1.07  --   HEPARINUNFRC 0.27*  < > 0.40 0.21*  --  0.17* 0.54  --  0.85*  CREATININE 0.74  --  0.76  --   --   --   --   --   --   < > = values in this interval not displayed.    Estimated Creatinine Clearance: 94.3 mL/min (by C-G formula based on SCr of 0.76 mg/dL).   Medical History:     Past Medical History:  Diagnosis Date  . Anemia   . CAD (coronary artery disease) 07/08/2011   Overview:  a. 07/2006: Anterior ST elevation MI. b. 07/2006: PCI with BMS to LAD and RCA. c. 09/2006: Non ST elevation. d. LVEF 30%.  Discussed ICD, not currently interested.   . 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 Dyslipidemia 07/30/2011   Overview:  High triglycerides  . GERD (gastroesophageal reflux disease) 07/08/2011  . History of Clostridium difficile colitis   . History of myocardial infarction   . Hyperlipidemia, unspecified 07/08/2011  . Hypothyroidism   . Hypothyroidism due to acquired atrophy of thyroid  02/15/2015  . Type II diabetes mellitus, uncontrolled ([place]) 07/08/2011  . Vitamin D deficiency     Medications:  Infusions:  . heparin 1,500 Units/hr (11/01/16 0803)    Assessment: 70 yom cc fever and cough. Sepsis with UC, on abx. 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 No AC on PTA list. Some recent bleeding related to UC, Hgb 10.9 on 1/15. Continue to follow Hgb.   Date                INR                  Dose 1/20                 1.06                 5 mg 1/21                 1.07  21m 1/22    1.26               7.532m  Goal of Therapy:  Heparin level 0.3-0.7 units/ml Monitor platelets by anticoagulation protocol: Yes   Plan:  Heparin Drip:   HL=0.27 slightly subtherapeutic. Due to pt nose bleeds and blood in diarrhea will not bolus but will increase rate to 1150 units/hr. Recheck in 8 hours.  1/18 2328 HL therapeutic x 1. Continue current rate. Will recheck HL in 8 hours. NAC  1/19 HL =0.40 therapeutic x2. Recheck HL in the AM along with a CBC.  1/20 0616 HL subtherapeutic x 1. RN reports infusion is still running appropriately. Patient has had no nosebleeds or blood in stool on her shift, but in report they noted blood streaked stool. Will give 600 units IV x 1 bolus and increase rate to 1250 units/hr. Recheck HL in 8 hours.    1/20 HL @ 1524= 0.17.  Will give bolus of 2500 units and increase Heparin drip to 1500 units/hr. Will rechek HL in 8 hr.  1/21 0103 HL therapeutic x 1. Continue current rate. Recheck HL in 8 hours. NAC  1/21 HL@ 0933= 0.85. Will decrease Heparin drip to 1350 units/hr and recheck in 8 hrs.  1/21 Hl@1825  = 0.86. Will decrease Heparin drip to 1150 units/hr and recheck in 8 hours (1/22 @ 0400).  1/22 HL @0511 : 0.52. WIll continue heparin drip at 1150 units/hr and recheck in 8 hours.   1/22 HL @ 1300: 0.49. Will continue heparin drip at 1150units/hr and recheck HL with AM labs.    WARFARIN: 1/20: Will initiate warfarin 92m17mO x1. Will monitor INR closely as patient is on Cipro and Flagyl which can increase the INR. Ordered INR for tomorrow morning.  1/21 INR= 1.07 . Will give Warfarin 5 mg x1 again today. If INR does not increase >1.5 tomorrow then anticipate giving 7.92mg56men. Patient on CIPRO and METRONIDAZOLE- significant drug interactions with Warfarin.  1/22 INR: 1.26. Will Give Warfarin 7.5 mg today. Should begin seeing increase in INR  Tomorrow. Patient still on Cipro and Flagyl. Will continue to monitor INR daily and dose per consult.    SheePernell DuprearmD, BCPS Clinical Pharmacist 11/02/2016 1:42 PM

## 2016-11-02 NOTE — Progress Notes (Signed)
[facility] NAME: David [name]    MR#:  [id number]  DATE OF BIRTH:  02-09-1946  SUBJECTIVE:  CHIEF COMPLAINT:   Chief Complaint  Patient presents with  . Fever  . Cough   No new complains, had few loose BM with no blood. No active bleed. Tolerating diet well. INR is still subtherapeutic.  REVIEW OF SYSTEMS:  Review of Systems  Constitutional: Positive for malaise/fatigue. Negative for weight loss.  HENT: Negative for nosebleeds and sore throat.   Eyes: Negative for blurred vision.  Respiratory: Negative for cough, shortness of breath and wheezing.   Cardiovascular: Negative for chest pain, orthopnea, leg swelling and PND.  Gastrointestinal: Negative for abdominal pain, blood in stool, constipation, diarrhea, heartburn, nausea and vomiting.  Genitourinary: Negative for dysuria and urgency.  Musculoskeletal: Negative for back pain.  Skin: Negative for rash.  Neurological: Positive for weakness. Negative for dizziness, speech change, focal weakness and headaches.  Endo/Heme/Allergies: Does not bruise/bleed easily.  Psychiatric/Behavioral: Negative for depression.   DRUG ALLERGIES:   Allergies  Allergen Reactions  . Codeine Nausea And Vomiting and Other (See Comments)   VITALS:  Blood pressure (!) 102/52, pulse 75, temperature 97.9 F (36.6 C), temperature source Oral, resp. rate 18, height 6' (1.829 m), weight 80.9 kg (178 lb 6.4 oz), SpO2 98 %. PHYSICAL EXAMINATION:  Physical Exam  Constitutional: He is oriented to person, place, and time and well-developed, well-nourished, and in no distress.  HENT:  Head: Normocephalic and atraumatic.  Eyes: Conjunctivae and EOM are normal. Pupils are equal, round, and reactive to light.  Neck: Normal range of motion. Neck supple. No tracheal deviation present. No thyromegaly present.  Cardiovascular: Normal rate, regular rhythm and normal heart sounds.   Pulmonary/Chest: Effort  normal and breath sounds normal. No respiratory distress. He has no wheezes. He exhibits no tenderness.  Abdominal: Soft. Bowel sounds are normal. He exhibits no distension. There is no tenderness.  Musculoskeletal: Normal range of motion.  Neurological: He is alert and oriented to person, place, and time. No cranial nerve deficit.  Skin: Skin is warm and dry. No rash noted.  Psychiatric: Mood and affect normal.   LABORATORY PANEL:   CBC  Recent Labs Lab 11/02/16 0511  WBC 6.4  HGB 10.0*  HCT 28.3*  PLT 356   ------------------------------------------------------------------------------------------------------------------ Chemistries   Recent Labs Lab 11/02/16 0511  NA 139  K 3.7  CL 107  CO2 27  GLUCOSE 143*  BUN 8  CREATININE 0.90  CALCIUM 8.1*  MG 1.8   RADIOLOGY:  No results found. ASSESSMENT AND PLAN:  This is a 71 year old male admitted for sepsis secondary to colitis and mesenteric ischemia.  1. E.coli Sepsis: present on admission - His bcx growing E.coli, C dff is negative.  - Appreciate ID input  - Antibiotics changed per final culture results. cipro + flagyl- total 14 days from admission. - tolerated CLD - advance to FLD- and now regular.  2. Mesenteric ischemia: continue heparin.    Vascular consult to help decide oral anticoagulants and length of therapy.   Started on coumadin, INR is still subtherapeutic today.   We ay start on lovenox [place] and discharge , if not therapeutic tomorrow.  3. Ulcerative colitis: The patient had been in remission for the last 10 years after receiving Remicade. He has been reluctant to receive another treatment with his more recent flares due to concerns regarding malignancy associated with immunotherapy.  - Dr.  Vicente Males started [employer] earlier but Dr. Epimenio Foot from GI today d/c that and suggest prednisone, I discussed with Dr. Verita Lamb about safty of prednisone- ( though he is not here on weekend, but he knows the pt and is on his  case)- he said as infection is now in control- it is safe to start prednisone.   Pt want to have a trial of that before considering the strongly suggested option of having colectomy ( by both surgery and GI), he says he will like to have some remission and then follow at GI in [place]. Out GI team need to help him set up appointment with [facility] before discharge.  4. Coronary artery disease: Stable. Continue aspirin 5. Hypertension: Controlled; continue carvedilol and ACE inhibitor 6. Hyperlipidemia: Continue statin therapy 7. Hypothyroidism: Continue Synthroid.  8. DVT prophylaxis: Therapeutic anticoagulation as above 9. GI prophylaxis: Pantoprazole per home regimen  - patient refusing tertiary care transfer for now and prefers to stay here   All the records are reviewed and case discussed with Care Management/Social Worker. Management plans discussed with the patient, Dr Vicente Males, Nursing and they are in agreement.  CODE STATUS: FULL CODE, Palliative care c/s  TOTAL TIME TAKING CARE OF THIS PATIENT: 35 minutes.   More than 50% of the time was spent in counseling/coordination of care: YES   POSSIBLE D/C IN 1-2 DAYS, DEPENDING ON CLINICAL CONDITION.   Vaughan Basta M.D on 11/02/2016 at 9:32 PM  Between 7am to 6pm - Pager - [phone]  After 6pm go to [email or web address] - Proofreader  [facility]  Office  [phone]  CC: Primary care physician; Madelyn Brunner, MD  Note: This dictation was prepared with Dragon dictation along with smaller phrase technology. Any transcriptional errors that result from this process are unintentional.

## 2016-11-02 NOTE — Progress Notes (Signed)
MEDICATION RELATED CONSULT NOTE - INITIAL   Pharmacy Consult for electrolyte monitoring and replacement Indication: hypokalemia/VTach  Allergies  Allergen Reactions  . Codeine Nausea And Vomiting and Other (See Comments)    Patient Measurements: Height: 6' (182.9 cm) Weight: 178 lb 6.4 oz (80.9 kg) IBW/kg (Calculated) : 77.6 Adjusted Body Weight:   Labs:  Recent Labs  10/31/16 0616 11/01/16 0606 11/02/16 0511  WBC 6.1 6.2 6.4  HGB 8.9* 9.4* 10.0*  HCT 25.3* 26.9* 28.3*  PLT 232 292 356  CREATININE  --   --  0.90  MG 1.8 2.1 1.8  PHOS 2.8 3.0 3.2   Estimated Creatinine Clearance: 83.8 mL/min (by C-G formula based on SCr of 0.9 mg/dL).   Medical History: Past Medical History:  Diagnosis Date  . Anemia   . CAD (coronary artery disease) 07/08/2011   Overview:  a. 07/2006: Anterior ST elevation MI. b. 07/2006: PCI with BMS to LAD and RCA. c. 09/2006: Non ST elevation. d. LVEF 30%.  Discussed ICD, not currently interested.   . 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 Dyslipidemia 07/30/2011   Overview:  High triglycerides  . GERD (gastroesophageal reflux disease) 07/08/2011  . History of Clostridium difficile colitis   . History of myocardial infarction   . Hyperlipidemia, unspecified 07/08/2011  . Hypothyroidism   . 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 Type II diabetes mellitus, uncontrolled ([place]) 07/08/2011  . Vitamin D deficiency     Medications:  Infusions:  . heparin 1,150 Units/hr (11/01/16 2000)    Assessment: 18 yom admitted for chills with worsening diarrhea. Fever on admission, ID started meropenem. GI following for UC and diarrhea. Patient had VTach and low electrolytes 2 nights ago. Baseline labs: K 2.7, Mg 1.7, phos 1.6, Ca 7.2 corrected to 8.5.   Goal of Therapy:  Electrolytes WNL  Plan:  1/22 :K=3.7, Mg=1.8, phos=3.2  Electrolytes WNL. Recheck tomorrow with AM labs.  Edgel Degnan M Zaakirah Kistner,  Pharm.D., BCPS Clinical pharmacist 11/02/2016,8:30 AM

## 2016-11-03 LAB — PROTIME-INR
INR: 1.7
Prothrombin Time: 20.2 seconds — ABNORMAL HIGH (ref 11.4–15.2)

## 2016-11-03 LAB — POTASSIUM: Potassium: 3.9 mmol/L (ref 3.5–5.1)

## 2016-11-03 LAB — MAGNESIUM: Magnesium: 1.7 mg/dL (ref 1.7–2.4)

## 2016-11-03 LAB — GLUCOSE, CAPILLARY
Glucose-Capillary: 166 mg/dL — ABNORMAL HIGH (ref 65–99)
Glucose-Capillary: 311 mg/dL — ABNORMAL HIGH (ref 65–99)
Glucose-Capillary: 313 mg/dL — ABNORMAL HIGH (ref 65–99)
Glucose-Capillary: 289 mg/dL — ABNORMAL HIGH (ref 65–99)

## 2016-11-03 LAB — HEPARIN LEVEL (UNFRACTIONATED): HEPARIN UNFRACTIONATED: 0.39 [IU]/mL (ref 0.30–0.70)

## 2016-11-03 MED ORDER — PREDNISONE 20 MG PO TABS
30.0000 mg | ORAL_TABLET | Freq: Every day | ORAL | Status: DC
  Administered 2016-11-04: 30 mg via ORAL
  Filled 2016-11-03: qty 1

## 2016-11-03 MED ORDER — WARFARIN SODIUM 5 MG PO TABS
5.0000 mg | ORAL_TABLET | Freq: Once | ORAL | Status: AC
  Administered 2016-11-03: 5 mg via ORAL
  Filled 2016-11-03: qty 1

## 2016-11-03 MED ORDER — PREDNISONE 10 MG PO TABS: 15.0000 mg | ORAL_TABLET | Freq: Every day | ORAL | Status: DC

## 2016-11-03 MED ORDER — PREDNISONE 10 MG PO TABS: 5.0000 mg | ORAL_TABLET | Freq: Every day | ORAL | Status: DC

## 2016-11-03 MED ORDER — PREDNISONE 50 MG PO TABS: 25.0000 mg | ORAL_TABLET | Freq: Every day | ORAL | Status: DC

## 2016-11-03 MED ORDER — MAGNESIUM SULFATE 2 GM/50ML IV SOLN
2.0000 g | Freq: Once | INTRAVENOUS | Status: AC
  Administered 2016-11-03: 2 g via INTRAVENOUS
  Filled 2016-11-03: qty 50

## 2016-11-03 MED ORDER — PREDNISONE 20 MG PO TABS
30.0000 mg | ORAL_TABLET | Freq: Every day | ORAL | Status: DC
  Administered 2016-11-03: 11:00:00 30 mg via ORAL
  Filled 2016-11-03: qty 1

## 2016-11-03 MED ORDER — PREDNISONE 20 MG PO TABS: 20.0000 mg | ORAL_TABLET | Freq: Every day | ORAL | Status: DC

## 2016-11-03 MED ORDER — PREDNISONE 10 MG PO TABS: 10.0000 mg | ORAL_TABLET | Freq: Every day | ORAL | Status: DC

## 2016-11-03 NOTE — Progress Notes (Signed)
[name] MD [address] [place], [place] [address] Phone: [phone] Fax : [phone]  David [name] is being followed for severe ulcerative colitis, e coli bacteremia and SMV clot.    Subjective: Doing well , 2 non bloody bowel movements yesterday   Objective: Vital signs in last 24 hours: Vitals:   11/02/16 1545 11/02/16 2001 11/03/16 0438 11/03/16 0816  BP: (!) 121/54 (!) 102/52 116/61 119/66  Pulse: 75 75 88 83  Resp: 16 18 17    Temp: 98.1 F (36.7 C) 97.9 F (36.6 C) 98.1 F (36.7 C) 98.1 F (36.7 C)  TempSrc: Oral Oral Oral Oral  SpO2: 99% 98% 95% 98%  Weight:      Height:       Weight change:   Intake/Output Summary (Last 24 hours) at 11/03/16 1007 Last data filed at 11/03/16 0800  Gross per 24 hour  Intake           591.01 ml  Output              775 ml  Net          -183.99 ml     Exam: Heart:: Regular rate and rhythm, S1S2 present or without murmur or extra heart sounds Lungs: normal, clear to auscultation and clear to auscultation and percussion Abdomen: soft, nontender, normal bowel sounds   Lab Results: @LABTEST2 @ Micro Results: Recent Results (from the past 240 hour(s))  Culture, blood (Routine x 2)     Status: Abnormal   Collection Time: 10/26/16  6:12 PM  Result Value Ref Range Status   Specimen Description BLOOD RIGHT FA  Final   Special Requests   Final    BOTTLES DRAWN AEROBIC AND ANAEROBIC AER 16ML ANA 16ML   Culture  Setup Time   Final    GRAM NEGATIVE RODS ANAEROBIC BOTTLE ONLY CRITICAL RESULT CALLED TO, READ BACK BY AND VERIFIED WITH: [facility] HALLAJI 10/27/16 @ 2220 [place] Performed at [facility], [address] [place], [place] [address]    Culture ESCHERICHIA COLI (A)  Final   Report Status 10/30/2016 FINAL  Final   Organism ID, Bacteria ESCHERICHIA COLI  Final      Susceptibility   Escherichia coli - MIC*    AMPICILLIN <=2 SENSITIVE Sensitive     CEFAZOLIN <=4 SENSITIVE Sensitive     CEFEPIME <=1  SENSITIVE Sensitive     CEFTAZIDIME <=1 SENSITIVE Sensitive     CEFTRIAXONE <=1 SENSITIVE Sensitive     CIPROFLOXACIN <=0.25 SENSITIVE Sensitive     GENTAMICIN <=1 SENSITIVE Sensitive     IMIPENEM <=0.25 SENSITIVE Sensitive     TRIMETH/SULFA <=20 SENSITIVE Sensitive     AMPICILLIN/SULBACTAM <=2 SENSITIVE Sensitive     PIP/TAZO <=4 SENSITIVE Sensitive     Extended ESBL NEGATIVE Sensitive     * ESCHERICHIA COLI  Culture, blood (Routine x 2)     Status: Abnormal   Collection Time: 10/26/16  6:12 PM  Result Value Ref Range Status   Specimen Description BLOOD LEFT FOREARM  Final   Special Requests   Final    BOTTLES DRAWN AEROBIC AND ANAEROBIC AER 11ML ANA 10ML   Culture  Setup Time   Final    GRAM NEGATIVE RODS AEROBIC BOTTLE ONLY CRITICAL VALUE NOTED.  VALUE IS CONSISTENT WITH PREVIOUSLY REPORTED AND CALLED VALUE. GRAM POSITIVE COCCI ANAEROBIC BOTTLE ONLY CRITICAL RESULT CALLED TO, READ BACK BY AND VERIFIED WITH: Currie Paris @ 9371 10/31/17 by [facility]  Culture (A)  Final    ESCHERICHIA COLI VIRIDANS STREPTOCOCCUS THE SIGNIFICANCE OF ISOLATING THIS ORGANISM FROM A SINGLE SET OF BLOOD CULTURES WHEN MULTIPLE SETS ARE DRAWN IS UNCERTAIN. PLEASE NOTIFY THE MICROBIOLOGY DEPARTMENT WITHIN ONE WEEK IF SPECIATION AND SENSITIVITIES ARE REQUIRED. Performed at [facility], [place] [address], [place], [place] [address]    Report Status 11/02/2016 FINAL  Final   Organism ID, Bacteria ESCHERICHIA COLI  Final      Susceptibility   Escherichia coli - MIC*    AMPICILLIN >=32 RESISTANT Resistant     CEFAZOLIN >=64 RESISTANT Resistant     CEFEPIME <=1 SENSITIVE Sensitive     CEFTAZIDIME <=1 SENSITIVE Sensitive     CEFTRIAXONE <=1 SENSITIVE Sensitive     CIPROFLOXACIN <=0.25 SENSITIVE Sensitive     GENTAMICIN <=1 SENSITIVE Sensitive     IMIPENEM <=0.25 SENSITIVE Sensitive     TRIMETH/SULFA <=20 SENSITIVE Sensitive     AMPICILLIN/SULBACTAM >=32 RESISTANT Resistant     PIP/TAZO <=4 SENSITIVE  Sensitive     Extended ESBL NEGATIVE Sensitive     * ESCHERICHIA COLI  Blood Culture ID Panel (Reflexed)     Status: Abnormal   Collection Time: 10/26/16  6:12 PM  Result Value Ref Range Status   Enterococcus species NOT DETECTED NOT DETECTED Final   Listeria monocytogenes NOT DETECTED NOT DETECTED Final   Staphylococcus species NOT DETECTED NOT DETECTED Final   Staphylococcus aureus NOT DETECTED NOT DETECTED Final   Streptococcus species NOT DETECTED NOT DETECTED Final   Streptococcus agalactiae NOT DETECTED NOT DETECTED Final   Streptococcus pneumoniae NOT DETECTED NOT DETECTED Final   Streptococcus pyogenes NOT DETECTED NOT DETECTED Final   Acinetobacter baumannii NOT DETECTED NOT DETECTED Final   Enterobacteriaceae species DETECTED (A) NOT DETECTED Final    Comment: CRITICAL RESULT CALLED TO, READ BACK BY AND VERIFIED WITH: [name] HALLAJI 10/27/16 2220 [place]    Enterobacter cloacae complex NOT DETECTED NOT DETECTED Final   Escherichia coli DETECTED (A) NOT DETECTED Final    Comment: CRITICAL RESULT CALLED TO, READ BACK BY AND VERIFIED WITH: [name] HALLAJI 10/27/16 2220 [place]    Klebsiella oxytoca NOT DETECTED NOT DETECTED Final   Klebsiella pneumoniae NOT DETECTED NOT DETECTED Final   Proteus species NOT DETECTED NOT DETECTED Final   Serratia marcescens NOT DETECTED NOT DETECTED Final   Carbapenem resistance NOT DETECTED NOT DETECTED Final   Haemophilus influenzae NOT DETECTED NOT DETECTED Final   Neisseria meningitidis NOT DETECTED NOT DETECTED Final   Pseudomonas aeruginosa NOT DETECTED NOT DETECTED Final   Candida albicans NOT DETECTED NOT DETECTED Final   Candida glabrata NOT DETECTED NOT DETECTED Final   Candida krusei NOT DETECTED NOT DETECTED Final   Candida parapsilosis NOT DETECTED NOT DETECTED Final   Candida tropicalis NOT DETECTED NOT DETECTED Final  Blood Culture ID Panel (Reflexed)     Status: Abnormal   Collection Time: 10/26/16  6:12 PM  Result Value Ref Range  Status   Enterococcus species NOT DETECTED NOT DETECTED Final   Listeria monocytogenes NOT DETECTED NOT DETECTED Final   Staphylococcus species NOT DETECTED NOT DETECTED Final   Staphylococcus aureus NOT DETECTED NOT DETECTED Final   Streptococcus species DETECTED (A) NOT DETECTED Final    Comment: CRITICAL RESULT CALLED TO, READ BACK BY AND VERIFIED WITH: Currie Paris @ 6045 10/31/16 by [place]    Streptococcus agalactiae NOT DETECTED NOT DETECTED Final   Streptococcus pneumoniae NOT DETECTED NOT DETECTED Final   Streptococcus pyogenes NOT  DETECTED NOT DETECTED Final   Acinetobacter baumannii NOT DETECTED NOT DETECTED Final   Enterobacteriaceae species NOT DETECTED NOT DETECTED Final   Enterobacter cloacae complex NOT DETECTED NOT DETECTED Final   Escherichia coli NOT DETECTED NOT DETECTED Final   Klebsiella oxytoca NOT DETECTED NOT DETECTED Final   Klebsiella pneumoniae NOT DETECTED NOT DETECTED Final   Proteus species NOT DETECTED NOT DETECTED Final   Serratia marcescens NOT DETECTED NOT DETECTED Final   Haemophilus influenzae NOT DETECTED NOT DETECTED Final   Neisseria meningitidis NOT DETECTED NOT DETECTED Final   Pseudomonas aeruginosa NOT DETECTED NOT DETECTED Final   Candida albicans NOT DETECTED NOT DETECTED Final   Candida glabrata NOT DETECTED NOT DETECTED Final   Candida krusei NOT DETECTED NOT DETECTED Final   Candida parapsilosis NOT DETECTED NOT DETECTED Final   Candida tropicalis NOT DETECTED NOT DETECTED Final  Urine culture     Status: Abnormal   Collection Time: 10/26/16  8:30 PM  Result Value Ref Range Status   Specimen Description URINE, RANDOM  Final   Special Requests NONE  Final   Culture (A)  Final    <10,000 COLONIES/mL INSIGNIFICANT GROWTH Performed at [facility], [address], [place], [place] [address]    Report Status 10/28/2016 FINAL  Final  C difficile quick scan w PCR reflex     Status: None   Collection Time: 10/27/16  4:58 PM  Result  Value Ref Range Status   C Diff antigen NEGATIVE NEGATIVE Final   C Diff toxin NEGATIVE NEGATIVE Final   C Diff interpretation No C. difficile detected.  Final  Gastrointestinal Panel by PCR , Stool     Status: None   Collection Time: 10/27/16  4:58 PM  Result Value Ref Range Status   Campylobacter species NOT DETECTED NOT DETECTED Final   Plesimonas shigelloides NOT DETECTED NOT DETECTED Final   Salmonella species NOT DETECTED NOT DETECTED Final   Yersinia enterocolitica NOT DETECTED NOT DETECTED Final   Vibrio species NOT DETECTED NOT DETECTED Final   Vibrio cholerae NOT DETECTED NOT DETECTED Final   Enteroaggregative E coli (EAEC) NOT DETECTED NOT DETECTED Final   Enteropathogenic E coli (EPEC) NOT DETECTED NOT DETECTED Final   Enterotoxigenic E coli (ETEC) NOT DETECTED NOT DETECTED Final   Shiga like toxin producing E coli (STEC) NOT DETECTED NOT DETECTED Final   Shigella/Enteroinvasive E coli (EIEC) NOT DETECTED NOT DETECTED Final   Cryptosporidium NOT DETECTED NOT DETECTED Final   Cyclospora cayetanensis NOT DETECTED NOT DETECTED Final   Entamoeba histolytica NOT DETECTED NOT DETECTED Final   Giardia lamblia NOT DETECTED NOT DETECTED Final   Adenovirus F40/41 NOT DETECTED NOT DETECTED Final   Astrovirus NOT DETECTED NOT DETECTED Final   Norovirus GI/GII NOT DETECTED NOT DETECTED Final   Rotavirus A NOT DETECTED NOT DETECTED Final   Sapovirus (I, II, IV, and V) NOT DETECTED NOT DETECTED Final   Studies/Results: No results found. Medications: I have reviewed the patient's current medications. Scheduled Meds: . aspirin EC  81 mg Oral BID  . atorvastatin  40 mg Oral Daily  . carvedilol  3.125 mg Oral BID WC  . cholecalciferol  2,000 Units Oral Daily  . ciprofloxacin  500 mg Oral BID  . feeding supplement (ENSURE ENLIVE)  237 mL Oral TID BM  . insulin aspart  0-5 Units Subcutaneous QHS  . insulin aspart  0-9 Units Subcutaneous TID WC  . levothyroxine  50 mcg Oral QAC  breakfast  .  magnesium sulfate 1 - 4 g bolus IVPB  2 g Intravenous Once  . metroNIDAZOLE  500 mg Oral Q12H  . pantoprazole  40 mg Oral QAC breakfast  . [START ON 11/04/2016] predniSONE  30 mg Oral Q breakfast  . ramipril  5 mg Oral Daily  . sodium chloride flush  3 mL Intravenous Q12H  . warfarin  5 mg Oral ONCE-1800  . Warfarin - Pharmacist Dosing Inpatient   Does not apply q1800   Continuous Infusions: . heparin 1,150 Units/hr (11/01/16 2000)   PRN Meds:.acetaminophen **OR** acetaminophen, ondansetron **OR** ondansetron (ZOFRAN) IV, zolpidem   Assessment: Principal Problem:   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CC) Active Problems:   Ulcerative colitis (HCC)   Sepsis (HCC)   Fever   Elevated lactic acid level  Severe Ulcerative colitis been on high dose steroids with decrease in bleeding but persistence of inflammation on imaging. Admitted with a clot in the SMV, complicated with ecoli bacteremia  Presently his colitis seems to be mild. It is very likely that the clot was secondary to a hypercoagulative state from active colitis, it is also very likely his clot was infected . He was restarted on Prednisone over the weekend and is doing well.  Plan: 1. I put in the order for Prednisone- did not find it on his MAR. Decrease dose by 5 mg Q 7 days. Put in order for 30 mg Q daily .  2. No NSAID's 3. F/u with [facility] as an outpatient. I will also have my secretaries expediate the appointment . I have advised him if he has to get admitted again for his GI issues would rather have him go to [facility] as we do not have colorectal surgery here .  4. OP follow up with me  5. I will sign off.  Please call me if any further GI concerns or questions.  We would like to thank you for the opportunity to participate in the care of Melanee Left.    LOS: 7 days   [name] 11/03/2016, 10:07 AM

## 2016-11-03 NOTE — Progress Notes (Signed)
[facility] [place] NAME: Byran Bilotti    MR#:  [id number]  DATE OF BIRTH:  Nov 03, 1945  SUBJECTIVE:  CHIEF COMPLAINT:   Chief Complaint  Patient presents with  . Fever  . Cough   No new complains, had one loose BM with no blood. No active bleed. Tolerating diet well. INR is still subtherapeutic.  REVIEW OF SYSTEMS:  Review of Systems  Constitutional: Positive for malaise/fatigue. Negative for weight loss.  HENT: Negative for nosebleeds and sore throat.   Eyes: Negative for blurred vision.  Respiratory: Negative for cough, shortness of breath and wheezing.   Cardiovascular: Negative for chest pain, orthopnea, leg swelling and PND.  Gastrointestinal: Negative for abdominal pain, blood in stool, constipation, diarrhea, heartburn, nausea and vomiting.  Genitourinary: Negative for dysuria and urgency.  Musculoskeletal: Negative for back pain.  Skin: Negative for rash.  Neurological: Positive for weakness. Negative for dizziness, speech change, focal weakness and headaches.  Endo/Heme/Allergies: Does not bruise/bleed easily.  Psychiatric/Behavioral: Negative for depression.   DRUG ALLERGIES:   Allergies  Allergen Reactions  . Codeine Nausea And Vomiting and Other (See Comments)   VITALS:  Blood pressure 119/66, pulse 83, temperature 98.1 F (36.7 C), temperature source Oral, resp. rate 17, height 6' (1.829 m), weight 80.9 kg (178 lb 6.4 oz), SpO2 98 %. PHYSICAL EXAMINATION:  Physical Exam  Constitutional: He is oriented to person, place, and time and well-developed, well-nourished, and in no distress.  HENT:  Head: Normocephalic and atraumatic.  Eyes: Conjunctivae and EOM are normal. Pupils are equal, round, and reactive to light.  Neck: Normal range of motion. Neck supple. No tracheal deviation present. No thyromegaly present.  Cardiovascular: Normal rate, regular rhythm and normal heart sounds.   Pulmonary/Chest: Effort  normal and breath sounds normal. No respiratory distress. He has no wheezes. He exhibits no tenderness.  Abdominal: Soft. Bowel sounds are normal. He exhibits no distension. There is no tenderness.  Musculoskeletal: Normal range of motion.  Neurological: He is alert and oriented to person, place, and time. No cranial nerve deficit.  Skin: Skin is warm and dry. No rash noted.  Psychiatric: Mood and affect normal.   LABORATORY PANEL:   CBC  Recent Labs Lab 11/02/16 0511  WBC 6.4  HGB 10.0*  HCT 28.3*  PLT 356   ------------------------------------------------------------------------------------------------------------------ Chemistries   Recent Labs Lab 11/02/16 0511 11/03/16 0528  NA 139  --   K 3.7 3.9  CL 107  --   CO2 27  --   GLUCOSE 143*  --   BUN 8  --   CREATININE 0.90  --   CALCIUM 8.1*  --   MG 1.8 1.7   RADIOLOGY:  No results found. ASSESSMENT AND PLAN:  This is a 71 year old male admitted for sepsis secondary to colitis and mesenteric ischemia.  1. E.coli Sepsis: present on admission - His bcx growing E.coli, C dff is negative.  - Appreciate ID input  - Antibiotics changed per final culture results. cipro + flagyl- total 14 days from admission ( 10/27/16). - tolerated CLD - advance to FLD- and now regular.  2. Mesenteric ischemia: continue heparin.    Vascular consult to help decide oral anticoagulants and length of therapy.   Started on coumadin, INR is still subtherapeutic today.   We ay start on lovenox [place] and discharge , if not therapeutic tomorrow.   Today is 1.7- will likely be therapeutic tomorrow.  3. Ulcerative colitis: The patient  had been in remission for the last 10 years after receiving Remicade. He has been reluctant to receive another treatment with his more recent flares due to concerns regarding malignancy associated with immunotherapy.  - Dr. Vicente Males started Entecort earlier but Dr. Epimenio Foot from GI today d/c that and suggest prednisone, I  discussed with Dr. Verita Lamb about safty of prednisone- ( though he is not here on weekend, but he knows the pt and is on his case)- he said as infection is now in control- it is safe to start prednisone.   Pt want to have a trial of that before considering the strongly suggested option of having colectomy ( by both surgery and GI), he says he will like to have some remission and then follow at GI in [place]. Out GI team need to help him set up appointment with [facility] before discharge.   Started on tapering steroids 5 mg taper every week.  4. Coronary artery disease: Stable. Continue aspirin 5. Hypertension: Controlled; continue carvedilol and ACE inhibitor 6. Hyperlipidemia: Continue statin therapy 7. Hypothyroidism: Continue Synthroid.  8. DVT prophylaxis: Therapeutic anticoagulation as above 9. GI prophylaxis: Pantoprazole per home regimen  - patient refusing tertiary care transfer for now and prefers to stay here   All the records are reviewed and case discussed with Care Management/Social Worker. Management plans discussed with the patient, Dr Vicente Males, Nursing and they are in agreement.  CODE STATUS: FULL CODE, Palliative care c/s  TOTAL TIME TAKING CARE OF THIS PATIENT: 35 minutes.   More than 50% of the time was spent in counseling/coordination of care: YES   POSSIBLE D/C IN 1-2 DAYS, DEPENDING ON CLINICAL CONDITION.   Vaughan Basta M.D on 11/03/2016 at 6:53 PM  Between 7am to 6pm - Pager - [phone]  After 6pm go to [email or web address] - Proofreader  [facility]  Office  [phone]  CC: Primary care physician; Madelyn Brunner, MD  Note: This dictation was prepared with Dragon dictation along with smaller phrase technology. Any transcriptional errors that result from this process are unintentional.

## 2016-11-03 NOTE — Plan of Care (Signed)
Problem: Safety: Goal: Ability to remain free from injury will improve Outcome: Progressing Patient education was reinforced on her not getting out of bed without call first

## 2016-11-03 NOTE — Progress Notes (Signed)
MEDICATION RELATED CONSULT NOTE - INITIAL   Pharmacy Consult for electrolyte monitoring and replacement Indication: hypokalemia/VTach  Allergies  Allergen Reactions  . Codeine Nausea And Vomiting and Other (See Comments)    Patient Measurements: Height: 6' (182.9 cm) Weight: 178 lb 6.4 oz (80.9 kg) IBW/kg (Calculated) : 77.6 Adjusted Body Weight:   Labs:  Recent Labs  11/01/16 0606 11/02/16 0511 11/03/16 0528  WBC 6.2 6.4  --   HGB 9.4* 10.0*  --   HCT 26.9* 28.3*  --   PLT 292 356  --   CREATININE  --  0.90  --   MG 2.1 1.8 1.7  PHOS 3.0 3.2  --    Estimated Creatinine Clearance: 83.8 mL/min (by C-G formula based on SCr of 0.9 mg/dL).   Medical History: Past Medical History:  Diagnosis Date  . Anemia   . CAD (coronary artery disease) 07/08/2011   Overview:  a. 07/2006: Anterior ST elevation MI. b. 07/2006: PCI with BMS to LAD and RCA. c. 09/2006: Non ST elevation. d. LVEF 30%.  Discussed ICD, not currently interested.   . 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 Dyslipidemia 07/30/2011   Overview:  High triglycerides  . GERD (gastroesophageal reflux disease) 07/08/2011  . History of Clostridium difficile colitis   . History of myocardial infarction   . Hyperlipidemia, unspecified 07/08/2011  . Hypothyroidism   . 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 Type II diabetes mellitus, uncontrolled ([place]) 07/08/2011  . Vitamin D deficiency     Medications:  Infusions:  . heparin 1,150 Units/hr (11/01/16 2000)    Assessment: 26 yom admitted for chills with worsening diarrhea. Fever on admission, ID started meropenem. GI following for UC and diarrhea. Patient had VTach and low electrolytes 2 nights ago. Baseline labs: K 2.7, Mg 1.7, phos 1.6, Ca 7.2 corrected to 8.5.   Goal of Therapy:  Electrolytes WNL  Plan:  1/23 :K=3.9, Mg=1.7  Will order Magnesium IV 2gm.  Recheck electrolytes in two days with AM  labs.  Fabienne Nolasco M Ladiamond Gallina, Pharm.D., BCPS Clinical pharmacist 11/03/2016,7:39 AM

## 2016-11-03 NOTE — Progress Notes (Signed)
ANTICOAGULATION CONSULT NOTE -follow up [place] for heparin drip AND  Warfarin Indication: thrombosis of proximal inferior mesenteric vein      Allergies  Allergen Reactions  . Codeine Nausea And Vomiting and Other (See Comments)    Patient Measurements: Height: 6' (182.9 cm) Weight: 182 lb (82.6 kg) IBW/kg (Calculated) : 77.6 Heparin Dosing Weight: 79.4 kg  Labs:  Recent Labs (last 2 labs)    Recent Labs  10/29/16 1404  10/30/16 0819 10/31/16 0616 10/31/16 1053 10/31/16 1524 11/01/16 0103 11/01/16 0606 11/01/16 0933  HGB  --   < > 9.5* 8.9*  --   --   --  9.4*  --   HCT  --   --  27.5* 25.3*  --   --   --  26.9*  --   PLT  --   --  211 232  --   --   --  292  --   LABPROT  --   --   --   --  13.8  --   --  13.9  --   INR  --   --   --   --  1.06  --   --  1.07  --   HEPARINUNFRC 0.27*  < > 0.40 0.21*  --  0.17* 0.54  --  0.85*  CREATININE 0.74  --  0.76  --   --   --   --   --   --   < > = values in this interval not displayed.    Estimated Creatinine Clearance: 94.3 mL/min (by C-G formula based on SCr of 0.76 mg/dL).   Medical History:     Past Medical History:  Diagnosis Date  . Anemia   . CAD (coronary artery disease) 07/08/2011   Overview:  a. 07/2006: Anterior ST elevation MI. b. 07/2006: PCI with BMS to LAD and RCA. c. 09/2006: Non ST elevation. d. LVEF 30%.  Discussed ICD, not currently interested.   . 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 Dyslipidemia 07/30/2011   Overview:  High triglycerides  . GERD (gastroesophageal reflux disease) 07/08/2011  . History of Clostridium difficile colitis   . History of myocardial infarction   . Hyperlipidemia, unspecified 07/08/2011  . Hypothyroidism   . 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 Type II diabetes mellitus, uncontrolled ([place]) 07/08/2011  . Vitamin D deficiency     Medications:  Infusions:  . heparin 1,500  Units/hr (11/01/16 0803)    Assessment: 70 yom cc fever and cough. Sepsis with UC, on abx. 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 No AC on PTA list. Some recent bleeding related to UC, Hgb 10.9 on 1/15. Continue to follow Hgb.   Date                INR                  Dose 1/20                 1.06                 5 mg 1/21                 1.07        74m 1/22    1.26               7.564m 1/23  1.70  Goal of Therapy:  Heparin level 0.3-0.7 units/ml Monitor platelets by anticoagulation protocol: Yes   Plan:  Heparin Drip:   HL=0.27 slightly subtherapeutic. Due to pt nose bleeds and blood in diarrhea will not bolus but will increase rate to 1150 units/hr. Recheck in 8 hours.  1/18 2328 HL therapeutic x 1. Continue current rate. Will recheck HL in 8 hours. NAC  1/19 HL =0.40 therapeutic x2. Recheck HL in the AM along with a CBC.  1/20 0616 HL subtherapeutic x 1. RN reports infusion is still running appropriately. Patient has had no nosebleeds or blood in stool on her shift, but in report they noted blood streaked stool. Will give 600 units IV x 1 bolus and increase rate to 1250 units/hr. Recheck HL in 8 hours.    1/20 HL @ 1524= 0.17.  Will give bolus of 2500 units and increase Heparin drip to 1500 units/hr. Will rechek HL in 8 hr.  1/21 0103 HL therapeutic x 1. Continue current rate. Recheck HL in 8 hours. NAC  1/21 HL@ 0933= 0.85. Will decrease Heparin drip to 1350 units/hr and recheck in 8 hrs.  1/21 Hl@1825  = 0.86. Will decrease Heparin drip to 1150 units/hr and recheck in 8 hours (1/22 @ 0400).  1/22 HL @0511 : 0.52. WIll continue heparin drip at 1150 units/hr and recheck in 8 hours.   1/22 HL @ 1300: 0.49. Will continue heparin drip at 1150units/hr and recheck HL with AM labs.   1/23 HL @ 0528: 0.39 Will continue heparin drip at 1150units.hr and recheck HL with AM labs   WARFARIN: 1/20: Will initiate warfarin  11m PO x1. Will monitor INR closely as patient is on Cipro and Flagyl which can increase the INR. Ordered INR for tomorrow morning.  1/21 INR= 1.07 . Will give Warfarin 5 mg x1 again today. If INR does not increase >1.5 tomorrow then anticipate giving 7.533mthen. Patient on CIPRO and METRONIDAZOLE- significant drug interactions with Warfarin.  1/22 INR: 1.26. Will Give Warfarin 7.5 mg today. Should begin seeing increase in INR  Tomorrow. Patient still on Cipro and Flagyl. Will continue to monitor INR daily and dose per consult.   1/23 INR: 1.70. Will give Warfarin [id number]. Patient still on Cipro and Flagyl. Will continue to monitor INR daily.    ShePernell DupreharmD, BCPS Clinical Pharmacist 11/03/2016 7:29 AM

## 2016-11-04 ENCOUNTER — Telehealth: Payer: Self-pay | Admitting: Gastroenterology

## 2016-11-04 LAB — PROTIME-INR
INR: 1.9
Prothrombin Time: 22.1 seconds — ABNORMAL HIGH (ref 11.4–15.2)

## 2016-11-04 LAB — HEPARIN LEVEL (UNFRACTIONATED): HEPARIN UNFRACTIONATED: 0.54 [IU]/mL (ref 0.30–0.70)

## 2016-11-04 LAB — GLUCOSE, CAPILLARY
Glucose-Capillary: 153 mg/dL — ABNORMAL HIGH (ref 65–99)
Glucose-Capillary: 329 mg/dL — ABNORMAL HIGH (ref 65–99)

## 2016-11-04 MED ORDER — WARFARIN SODIUM 4 MG PO TABS: 4.0000 mg | ORAL_TABLET | Freq: Every day | ORAL | 0 refills | Status: DC

## 2016-11-04 MED ORDER — CIPROFLOXACIN HCL 500 MG PO TABS: 500.0000 mg | ORAL_TABLET | Freq: Two times a day (BID) | ORAL | 0 refills | Status: DC

## 2016-11-04 MED ORDER — PREDNISONE 10 MG (21) PO TBPK: ORAL_TABLET | ORAL | 0 refills | Status: DC

## 2016-11-04 MED ORDER — PREDNISONE 10 MG PO TABS: 30.0000 mg | ORAL_TABLET | Freq: Every day | ORAL | 0 refills | Status: DC

## 2016-11-04 MED ORDER — ENSURE ENLIVE PO LIQD: 237.0000 mL | Freq: Three times a day (TID) | ORAL | 12 refills | Status: DC

## 2016-11-04 MED ORDER — PREDNISONE 50 MG PO TABS: ORAL_TABLET | ORAL | 0 refills | Status: DC

## 2016-11-04 MED ORDER — METRONIDAZOLE 500 MG PO TABS: 500.0000 mg | ORAL_TABLET | Freq: Two times a day (BID) | ORAL | 0 refills | Status: DC

## 2016-11-04 MED ORDER — PREDNISONE 10 MG (21) PO TBPK: 10.0000 mg | ORAL_TABLET | Freq: Every day | ORAL | 0 refills | Status: DC

## 2016-11-04 MED ORDER — WARFARIN SODIUM 5 MG PO TABS: 5.0000 mg | ORAL_TABLET | Freq: Once | ORAL | Status: DC

## 2016-11-04 NOTE — Discharge Summary (Addendum)
[facility] NAME: David [name]    MR#:  [id number]  DATE OF BIRTH:  [birth year]  DATE OF ADMISSION:  10/26/2016 ADMITTING PHYSICIAN: Harrie Foreman, MD  DATE OF DISCHARGE: 11/04/2016  PRIMARY CARE PHYSICIAN: Madelyn Brunner, MD    ADMISSION DIAGNOSIS:  Elevated lactic acid level [R79.89] Fever, unspecified fever cause [R50.9]  DISCHARGE DIAGNOSIS:  Principal Problem:   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CC) Active Problems:   Ulcerative colitis (HCC)   Sepsis (HCC)   Fever   Elevated lactic acid level   SECONDARY DIAGNOSIS:   Past Medical History:  Diagnosis Date  . Anemia   . CAD (coronary artery disease) 07/08/2011   Overview:  a. 07/2006: Anterior ST elevation MI. b. 07/2006: PCI with BMS to LAD and RCA. c. 09/2006: Non ST elevation. d. LVEF 30%.  Discussed ICD, not currently interested.   . 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 Dyslipidemia 07/30/2011   Overview:  High triglycerides  . GERD (gastroesophageal reflux disease) 07/08/2011  . History of Clostridium difficile colitis   . History of myocardial infarction   . Hyperlipidemia, unspecified 07/08/2011  . Hypothyroidism   . 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 Type II diabetes mellitus, uncontrolled ([place]) 07/08/2011  . Vitamin D deficiency     HOSPITAL COURSE:   This is a 71 year old male admitted for sepsis secondary to colitis and mesenteric ischemia.  1. E.coli Sepsis: present on admission - His bcx growing E.coli, C dff is negative.  - Appreciate ID input  - Antibiotics changed per final culture results. cipro + flagyl- total 14 days from admission ( 10/27/16). - tolerated CLD - advance to FLD- and now regular.  2. Mesenteric ischemia: continue heparin.    Vascular consult to help decide oral anticoagulants and length of therapy.   Started on coumadin, INR is 1.9 today.    Which is satisfactory, so I will d/c him and advised to check INR in 4 days.  3. Ulcerative colitis: The patient had been in remission for the last 10 years after receiving Remicade. He has been reluctant to receive another treatment with his more recent flares due to concerns regarding malignancy associated with immunotherapy.  - Dr. Vicente Males started Entecort earlier but Dr. Epimenio Foot from GI today d/c that and suggest prednisone, I discussed with Dr. Verita Lamb about safty of prednisone- ( though he is not here on weekend, but he knows the pt and is on his case)- he said as infection is now in control- it is safe to start prednisone.   Pt want to have a trial of that before considering the strongly suggested option of having colectomy ( by both surgery and GI), he says he will like to have some remission and then follow at GI in [place]. Out GI team need to help him set up appointment with [facility] before discharge.   Started on tapering steroids 5 mg taper every week.  4. Coronary artery disease: Stable. Continue aspirin 5. Hypertension: Controlled; continue carvedilol and ACE inhibitor 6. Hyperlipidemia: Continue statin therapy 7. Hypothyroidism: Continue Synthroid.  8. DVT prophylaxis: Therapeutic anticoagulation as above 9. GI prophylaxis: Pantoprazole per home regimen   DISCHARGE CONDITIONS:   Stable.  CONSULTS OBTAINED:  Treatment Team:  Leonel Ramsay, MD Yolonda Kida, MD Katha Cabal, MD San Jetty, MD  DRUG ALLERGIES:   Allergies  Allergen Reactions  . Codeine Nausea And Vomiting  and Other (See Comments)    DISCHARGE MEDICATIONS:   Current Discharge Medication List    START taking these medications   Details  ciprofloxacin (CIPRO) 500 MG tablet Take 1 tablet (500 mg total) by mouth 2 (two) times daily. Qty: 12 tablet, Refills: 0    feeding supplement, ENSURE ENLIVE, (ENSURE ENLIVE) LIQD Take 237 mLs by mouth 3 (three) times daily between meals. Qty: 237 mL,  Refills: 12    metroNIDAZOLE (FLAGYL) 500 MG tablet Take 1 tablet (500 mg total) by mouth every 12 (twelve) hours. Qty: 12 tablet, Refills: 0    !! predniSONE (DELTASONE) 10 MG tablet Take 3 tablets (30 mg total) by mouth daily with breakfast. Qty: 6 tablet, Refills: 0    !! predniSONE (DELTASONE) 50 MG tablet Take 25 mg oral daily- for 7 days from 11/10/16 Qty: 4 tablet, Refills: 0    !! predniSONE (STERAPRED UNI-PAK 21 TAB) 10 MG (21) TBPK tablet Take 20 mg oral daily - starting from Feb 6th- this is as a part of tapering steroids- 5 mg taper every week. Qty: 14 tablet, Refills: 0    !! predniSONE (STERAPRED UNI-PAK 21 TAB) 10 MG (21) TBPK tablet Take 15 mg oral daily for 7 days- starting from feb 13- this is as a part of tapering steroids 5 mg every week. Qty: 12 tablet, Refills: 0    !! predniSONE (STERAPRED UNI-PAK 21 TAB) 10 MG (21) TBPK tablet Take 1 tablet (10 mg total) by mouth daily. For 7 days- starting from 12/01/16 Qty: 7 tablet, Refills: 0    warfarin (COUMADIN) 4 MG tablet Take 1 tablet (4 mg total) by mouth daily at 6 PM. Qty: 15 tablet, Refills: 0     !! - Potential duplicate medications found. Please discuss with provider.    CONTINUE these medications which have NOT CHANGED   Details  ACCU-CHEK AVIVA PLUS test strip 1 each by Other route as needed for other.     aspirin EC 81 MG tablet Take 81 mg by mouth 2 (two) times daily.     atorvastatin (LIPITOR) 40 MG tablet Take 40 mg by mouth daily.     benzonatate (TESSALON) 100 MG capsule Take 100 mg by mouth 3 (three) times daily as needed for cough.    carvedilol (COREG) 3.125 MG tablet Take 3.125 mg by mouth 2 (two) times daily with a meal.     Cholecalciferol (VITAMIN D) 2000 units CAPS Take 1 capsule by mouth daily.     glipiZIDE (GLUCOTROL) 10 MG tablet Take 10 mg by mouth 2 (two) times daily.     levothyroxine (SYNTHROID, LEVOTHROID) 50 MCG tablet Take 50 mcg by mouth daily before breakfast.     Multiple  Vitamins-Minerals (MULTIVITAMIN ADULT PO) Take 1 tablet by mouth daily.     Omega-3 Fatty Acids (FISH OIL PO) Take 1 capsule by mouth 2 (two) times daily.     omeprazole (PRILOSEC) 20 MG capsule Take 20 mg by mouth daily.     ramipril (ALTACE) 5 MG capsule Take 5 mg by mouth daily.       STOP taking these medications     cefUROXime (CEFTIN) 250 MG tablet          DISCHARGE INSTRUCTIONS:    Follow with PMD in 4 days.  If you experience worsening of your admission symptoms, develop shortness of breath, life threatening emergency, suicidal or homicidal thoughts you must seek medical attention immediately by calling 911 or calling your MD immediately  if symptoms less severe.  You Must read complete instructions/literature along with all the possible adverse reactions/side effects for all the Medicines you take and that have been prescribed to you. Take any new Medicines after you have completely understood and accept all the possible adverse reactions/side effects.   Please note  You were cared for by a hospitalist during your hospital stay. If you have any questions about your discharge medications or the care you received while you were in the hospital after you are discharged, you can call the unit and asked to speak with the hospitalist on call if the hospitalist that took care of you is not available. Once you are discharged, your primary care physician will handle any further medical issues. Please note that NO REFILLS for any discharge medications will be authorized once you are discharged, as it is imperative that you return to your primary care physician (or establish a relationship with a primary care physician if you do not have one) for your aftercare needs so that they can reassess your need for medications and monitor your lab values.    Today   CHIEF COMPLAINT:   Chief Complaint  Patient presents with  . Fever  . Cough    HISTORY OF PRESENT ILLNESS:  David [name]  is a 71 y.o. male with a known history of ulcerative colitis presents to the emergency department after feeling unable to cope with chills and subjective fever. Upon arrival the patient was febrile to 103.56F. He states that he felt mild improvement since his last hospitalization but developed worsening diarrhea a few days prior to admission. The holiday weekend prevented him from getting oral antibiotics until yesterday. Since that time he has had some painful watery bowel movements. He denies seeing any blood in his stool. CT of the abdomen showed inflammation of the transverse colon as well as a clot in the inferior mesenteric vein. The surgery service was consulted who recommended initiation of heparin and the emergency department staff called the hospitalist service for admission.   VITAL SIGNS:  Blood pressure 139/77, pulse 80, temperature 97.9 F (36.6 C), temperature source Oral, resp. rate 17, height 6' (1.829 m), weight 78.5 kg (173 lb), SpO2 100 %.  I/O:    Intake/Output Summary (Last 24 hours) at 11/04/16 1100 Last data filed at 11/04/16 0811  Gross per 24 hour  Intake              748 ml  Output              900 ml  Net             -152 ml    PHYSICAL EXAMINATION:   Constitutional: He is oriented to person, place, and time and well-developed, well-nourished, and in no distress.  HENT:  Head: Normocephalic and atraumatic.  Eyes: Conjunctivae and EOM are normal. Pupils are equal, round, and reactive to light.  Neck: Normal range of motion. Neck supple. No tracheal deviation present. No thyromegaly present.  Cardiovascular: Normal rate, regular rhythm and normal heart sounds.   Pulmonary/Chest: Effort normal and breath sounds normal. No respiratory distress. He has no wheezes. He exhibits no tenderness.  Abdominal: Soft. Bowel sounds are normal. He exhibits no distension. There is no tenderness.  Musculoskeletal: Normal range of motion.  Neurological: He is alert and  oriented to person, place, and time. No cranial nerve deficit.  Skin: Skin is warm and dry. No rash noted.  Psychiatric: Mood and affect normal.  DATA REVIEW:   CBC  Recent Labs Lab 11/02/16 0511  WBC 6.4  HGB 10.0*  HCT 28.3*  PLT 356    Chemistries   Recent Labs Lab 11/02/16 0511 11/03/16 0528  NA 139  --   K 3.7 3.9  CL 107  --   CO2 27  --   GLUCOSE 143*  --   BUN 8  --   CREATININE 0.90  --   CALCIUM 8.1*  --   MG 1.8 1.7    Cardiac Enzymes No results for input(s): TROPONINI in the last 168 hours.  Microbiology Results  Results for orders placed or performed during the hospital encounter of 10/26/16  Culture, blood (Routine x 2)     Status: Abnormal   Collection Time: 10/26/16  6:12 PM  Result Value Ref Range Status   Specimen Description BLOOD RIGHT FA  Final   Special Requests   Final    BOTTLES DRAWN AEROBIC AND ANAEROBIC AER 16ML ANA 16ML   Culture  Setup Time   Final    GRAM NEGATIVE RODS ANAEROBIC BOTTLE ONLY CRITICAL RESULT CALLED TO, READ BACK BY AND VERIFIED WITH: [facility] HALLAJI 10/27/16 @ 36 [place] Performed at [facility], [address], [place], [place] [address]    Culture ESCHERICHIA COLI (A)  Final   Report Status 10/30/2016 FINAL  Final   Organism ID, Bacteria ESCHERICHIA COLI  Final      Susceptibility   Escherichia coli - MIC*    AMPICILLIN <=2 SENSITIVE Sensitive     CEFAZOLIN <=4 SENSITIVE Sensitive     CEFEPIME <=1 SENSITIVE Sensitive     CEFTAZIDIME <=1 SENSITIVE Sensitive     CEFTRIAXONE <=1 SENSITIVE Sensitive     CIPROFLOXACIN <=0.25 SENSITIVE Sensitive     GENTAMICIN <=1 SENSITIVE Sensitive     IMIPENEM <=0.25 SENSITIVE Sensitive     TRIMETH/SULFA <=20 SENSITIVE Sensitive     AMPICILLIN/SULBACTAM <=2 SENSITIVE Sensitive     PIP/TAZO <=4 SENSITIVE Sensitive     Extended ESBL NEGATIVE Sensitive     * ESCHERICHIA COLI  Culture, blood (Routine x 2)     Status: Abnormal   Collection Time: 10/26/16  6:12 PM   Result Value Ref Range Status   Specimen Description BLOOD LEFT FOREARM  Final   Special Requests   Final    BOTTLES DRAWN AEROBIC AND ANAEROBIC AER 11ML ANA 10ML   Culture  Setup Time   Final    GRAM NEGATIVE RODS AEROBIC BOTTLE ONLY CRITICAL VALUE NOTED.  VALUE IS CONSISTENT WITH PREVIOUSLY REPORTED AND CALLED VALUE. GRAM POSITIVE COCCI ANAEROBIC BOTTLE ONLY CRITICAL RESULT CALLED TO, READ BACK BY AND VERIFIED WITH: Currie Paris @ [phone] 10/31/17 by [facility]    Culture (A)  Final    ESCHERICHIA COLI VIRIDANS STREPTOCOCCUS THE SIGNIFICANCE OF ISOLATING THIS ORGANISM FROM A SINGLE SET OF BLOOD CULTURES WHEN MULTIPLE SETS ARE DRAWN IS UNCERTAIN. PLEASE NOTIFY THE MICROBIOLOGY DEPARTMENT WITHIN ONE WEEK IF SPECIATION AND SENSITIVITIES ARE REQUIRED. Performed at [facility], [place] [address] [place], [place] [address]    Report Status 11/02/2016 FINAL  Final   Organism ID, Bacteria ESCHERICHIA COLI  Final      Susceptibility   Escherichia coli - MIC*    AMPICILLIN >=32 RESISTANT Resistant     CEFAZOLIN >=64 RESISTANT Resistant     CEFEPIME <=1 SENSITIVE Sensitive     CEFTAZIDIME <=1 SENSITIVE Sensitive     CEFTRIAXONE <=1 SENSITIVE Sensitive  CIPROFLOXACIN <=0.25 SENSITIVE Sensitive     GENTAMICIN <=1 SENSITIVE Sensitive     IMIPENEM <=0.25 SENSITIVE Sensitive     TRIMETH/SULFA <=20 SENSITIVE Sensitive     AMPICILLIN/SULBACTAM >=32 RESISTANT Resistant     PIP/TAZO <=4 SENSITIVE Sensitive     Extended ESBL NEGATIVE Sensitive     * ESCHERICHIA COLI  Blood Culture ID Panel (Reflexed)     Status: Abnormal   Collection Time: 10/26/16  6:12 PM  Result Value Ref Range Status   Enterococcus species NOT DETECTED NOT DETECTED Final   Listeria monocytogenes NOT DETECTED NOT DETECTED Final   Staphylococcus species NOT DETECTED NOT DETECTED Final   Staphylococcus aureus NOT DETECTED NOT DETECTED Final   Streptococcus species NOT DETECTED NOT DETECTED Final   Streptococcus agalactiae  NOT DETECTED NOT DETECTED Final   Streptococcus pneumoniae NOT DETECTED NOT DETECTED Final   Streptococcus pyogenes NOT DETECTED NOT DETECTED Final   Acinetobacter baumannii NOT DETECTED NOT DETECTED Final   Enterobacteriaceae species DETECTED (A) NOT DETECTED Final    Comment: CRITICAL RESULT CALLED TO, READ BACK BY AND VERIFIED WITH: [name] HALLAJI 10/27/16 2220 [place]    Enterobacter cloacae complex NOT DETECTED NOT DETECTED Final   Escherichia coli DETECTED (A) NOT DETECTED Final    Comment: CRITICAL RESULT CALLED TO, READ BACK BY AND VERIFIED WITH: [name] HALLAJI 10/27/16 2220 [place]    Klebsiella oxytoca NOT DETECTED NOT DETECTED Final   Klebsiella pneumoniae NOT DETECTED NOT DETECTED Final   Proteus species NOT DETECTED NOT DETECTED Final   Serratia marcescens NOT DETECTED NOT DETECTED Final   Carbapenem resistance NOT DETECTED NOT DETECTED Final   Haemophilus influenzae NOT DETECTED NOT DETECTED Final   Neisseria meningitidis NOT DETECTED NOT DETECTED Final   Pseudomonas aeruginosa NOT DETECTED NOT DETECTED Final   Candida albicans NOT DETECTED NOT DETECTED Final   Candida glabrata NOT DETECTED NOT DETECTED Final   Candida krusei NOT DETECTED NOT DETECTED Final   Candida parapsilosis NOT DETECTED NOT DETECTED Final   Candida tropicalis NOT DETECTED NOT DETECTED Final  Blood Culture ID Panel (Reflexed)     Status: Abnormal   Collection Time: 10/26/16  6:12 PM  Result Value Ref Range Status   Enterococcus species NOT DETECTED NOT DETECTED Final   Listeria monocytogenes NOT DETECTED NOT DETECTED Final   Staphylococcus species NOT DETECTED NOT DETECTED Final   Staphylococcus aureus NOT DETECTED NOT DETECTED Final   Streptococcus species DETECTED (A) NOT DETECTED Final    Comment: CRITICAL RESULT CALLED TO, READ BACK BY AND VERIFIED WITH: Currie Paris @ [phone] 10/31/16 by [place]    Streptococcus agalactiae NOT DETECTED NOT DETECTED Final   Streptococcus pneumoniae NOT DETECTED NOT  DETECTED Final   Streptococcus pyogenes NOT DETECTED NOT DETECTED Final   Acinetobacter baumannii NOT DETECTED NOT DETECTED Final   Enterobacteriaceae species NOT DETECTED NOT DETECTED Final   Enterobacter cloacae complex NOT DETECTED NOT DETECTED Final   Escherichia coli NOT DETECTED NOT DETECTED Final   Klebsiella oxytoca NOT DETECTED NOT DETECTED Final   Klebsiella pneumoniae NOT DETECTED NOT DETECTED Final   Proteus species NOT DETECTED NOT DETECTED Final   Serratia marcescens NOT DETECTED NOT DETECTED Final   Haemophilus influenzae NOT DETECTED NOT DETECTED Final   Neisseria meningitidis NOT DETECTED NOT DETECTED Final   Pseudomonas aeruginosa NOT DETECTED NOT DETECTED Final   Candida albicans NOT DETECTED NOT DETECTED Final   Candida glabrata NOT DETECTED NOT DETECTED Final   Candida krusei NOT DETECTED NOT DETECTED Final  Candida parapsilosis NOT DETECTED NOT DETECTED Final   Candida tropicalis NOT DETECTED NOT DETECTED Final  Urine culture     Status: Abnormal   Collection Time: 10/26/16  8:30 PM  Result Value Ref Range Status   Specimen Description URINE, RANDOM  Final   Special Requests NONE  Final   Culture (A)  Final    <10,000 COLONIES/mL INSIGNIFICANT GROWTH Performed at [facility], [address] [place], [place] [address]    Report Status 10/28/2016 FINAL  Final  C difficile quick scan w PCR reflex     Status: None   Collection Time: 10/27/16  4:58 PM  Result Value Ref Range Status   C Diff antigen NEGATIVE NEGATIVE Final   C Diff toxin NEGATIVE NEGATIVE Final   C Diff interpretation No C. difficile detected.  Final  Gastrointestinal Panel by PCR , Stool     Status: None   Collection Time: 10/27/16  4:58 PM  Result Value Ref Range Status   Campylobacter species NOT DETECTED NOT DETECTED Final   Plesimonas shigelloides NOT DETECTED NOT DETECTED Final   Salmonella species NOT DETECTED NOT DETECTED Final   Yersinia enterocolitica NOT DETECTED NOT  DETECTED Final   Vibrio species NOT DETECTED NOT DETECTED Final   Vibrio cholerae NOT DETECTED NOT DETECTED Final   Enteroaggregative E coli (EAEC) NOT DETECTED NOT DETECTED Final   Enteropathogenic E coli (EPEC) NOT DETECTED NOT DETECTED Final   Enterotoxigenic E coli (ETEC) NOT DETECTED NOT DETECTED Final   Shiga like toxin producing E coli (STEC) NOT DETECTED NOT DETECTED Final   Shigella/Enteroinvasive E coli (EIEC) NOT DETECTED NOT DETECTED Final   Cryptosporidium NOT DETECTED NOT DETECTED Final   Cyclospora cayetanensis NOT DETECTED NOT DETECTED Final   Entamoeba histolytica NOT DETECTED NOT DETECTED Final   Giardia lamblia NOT DETECTED NOT DETECTED Final   Adenovirus F40/41 NOT DETECTED NOT DETECTED Final   Astrovirus NOT DETECTED NOT DETECTED Final   Norovirus GI/GII NOT DETECTED NOT DETECTED Final   Rotavirus A NOT DETECTED NOT DETECTED Final   Sapovirus (I, II, IV, and V) NOT DETECTED NOT DETECTED Final    RADIOLOGY:  No results found.  EKG:   Orders placed or performed during the hospital encounter of 09/28/16  . EKG 12-Lead  . EKG 12-Lead      Management plans discussed with the patient, family and they are in agreement.  CODE STATUS:     Code Status Orders        Start     Ordered   10/27/16 0255  Full code  Continuous     10/27/16 0254    Code Status History    Date Active Date Inactive Code Status Order ID Comments User Context   10/06/2016  3:49 PM 10/08/2016  6:54 PM Full Code [id number]  Max Sane, MD ED   09/28/2016  6:44 PM 10/02/2016  5:29 PM Full Code [id number]  Gladstone Lighter, MD Inpatient    Advance Directive Documentation   Flowsheet Row Most Recent Value  Type of Advance Directive  Living will  Pre-existing out of facility DNR order (yellow form or pink MOST form)  No data  "MOST" Form in Place?  No data      TOTAL TIME TAKING CARE OF THIS PATIENT: 35 minutes.    Vaughan Basta M.D on 11/04/2016 at 11:00 AM  Between 7am  to 6pm - Pager - [phone]  After 6pm go to [email or web address] - [place]  [facility]  Office  [phone]  CC: Primary care physician; Madelyn Brunner, MD   Note: This dictation was prepared with Dragon dictation along with smaller phrase technology. Any transcriptional errors that result from this process are unintentional.

## 2016-11-04 NOTE — Discharge Instructions (Signed)
Need to check INR in 4 days and then as advised by PMD.  Follow with GI in clinic.

## 2016-11-04 NOTE — Telephone Encounter (Signed)
Patient has an appointment 3/5 @ 2pm. [facility] needs a referral make URGENT REFERRAL ASAP. Per patient can you do this today? FAX [phone]

## 2016-11-04 NOTE — Care Management Important Message (Signed)
Important Message  Patient Details  Name: David [name] MRN: [id number] Date of Birth: [birth year]   Medicare Important Message Given:  Yes    Beverly Sessions, RN 11/04/2016, 12:28 PM

## 2016-11-04 NOTE — Progress Notes (Signed)
Patient discharge teaching given, including activity, diet, follow-up appoints, and medications. Patient verbalized understanding of all discharge instructions. IV access was d/c'd. Vitals are stable. Skin is intact except as charted in most recent assessments. Pt to be escorted out by volunteer, to be driven home by family.  Farida Mcreynolds [employer]

## 2016-11-04 NOTE — Progress Notes (Addendum)
ANTICOAGULATION CONSULT NOTE -follow up [facility] for heparin drip AND  Warfarin Indication: thrombosis of proximal inferior mesenteric vein      Allergies  Allergen Reactions  . Codeine Nausea And Vomiting and Other (See Comments)    Patient Measurements: Height: 6' (182.9 cm) Weight: 182 lb (82.6 kg) IBW/kg (Calculated) : 77.6 Heparin Dosing Weight: 79.4 kg  Labs:  Recent Labs (last 2 labs)    Recent Labs  10/29/16 1404  10/30/16 0819 10/31/16 0616 10/31/16 1053 10/31/16 1524 11/01/16 0103 11/01/16 0606 11/01/16 0933  HGB  --   < > 9.5* 8.9*  --   --   --  9.4*  --   HCT  --   --  27.5* 25.3*  --   --   --  26.9*  --   PLT  --   --  211 232  --   --   --  292  --   LABPROT  --   --   --   --  13.8  --   --  13.9  --   INR  --   --   --   --  1.06  --   --  1.07  --   HEPARINUNFRC 0.27*  < > 0.40 0.21*  --  0.17* 0.54  --  0.85*  CREATININE 0.74  --  0.76  --   --   --   --   --   --   < > = values in this interval not displayed.    Estimated Creatinine Clearance: 94.3 mL/min (by C-G formula based on SCr of 0.76 mg/dL).   Medical History:     Past Medical History:  Diagnosis Date  . Anemia   . CAD (coronary artery disease) 07/08/2011   Overview:  a. 07/2006: Anterior ST elevation MI. b. 07/2006: PCI with BMS to LAD and RCA. c. 09/2006: Non ST elevation. d. LVEF 30%.  Discussed ICD, not currently interested.   . 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 Dyslipidemia 07/30/2011   Overview:  High triglycerides  . GERD (gastroesophageal reflux disease) 07/08/2011  . History of Clostridium difficile colitis   . History of myocardial infarction   . Hyperlipidemia, unspecified 07/08/2011  . Hypothyroidism   . 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 Type II diabetes mellitus, uncontrolled ([place]) 07/08/2011  . Vitamin D deficiency     Medications:  Infusions:  . heparin 1,500  Units/hr (11/01/16 0803)    Assessment: 70 yom cc fever and cough. Sepsis with UC, on abx. 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 No AC on PTA list. Some recent bleeding related to UC, Hgb 10.9 on 1/15. Continue to follow Hgb.   Date                INR                  Dose 1/20                 1.06                 5 mg 1/21                 1.07        57m 1/22    1.26               7.527m 1/23  1.70    42m 1/24    1.90  Goal of Therapy:  Heparin level 0.3-0.7 units/ml Monitor platelets by anticoagulation protocol: Yes   Plan:  Heparin Drip:   HL=0.27 slightly subtherapeutic. Due to pt nose bleeds and blood in diarrhea will not bolus but will increase rate to 1150 units/hr. Recheck in 8 hours.  1/18 2328 HL therapeutic x 1. Continue current rate. Will recheck HL in 8 hours. NAC  1/19 HL =0.40 therapeutic x2. Recheck HL in the AM along with a CBC.  1/20 0616 HL subtherapeutic x 1. RN reports infusion is still running appropriately. Patient has had no nosebleeds or blood in stool on her shift, but in report they noted blood streaked stool. Will give 600 units IV x 1 bolus and increase rate to 1250 units/hr. Recheck HL in 8 hours.    1/20 HL @ 1524= 0.17.  Will give bolus of 2500 units and increase Heparin drip to 1500 units/hr. Will rechek HL in 8 hr.  1/21 0103 HL therapeutic x 1. Continue current rate. Recheck HL in 8 hours. NAC  1/21 HL@ 0933= 0.85. Will decrease Heparin drip to 1350 units/hr and recheck in 8 hrs.  1/21 Hl@1825  = 0.86. Will decrease Heparin drip to 1150 units/hr and recheck in 8 hours (1/22 @ 0400).  1/22 HL @0511 : 0.52. WIll continue heparin drip at 1150 units/hr and recheck in 8 hours.   1/22 HL @ 1300: 0.49. Will continue heparin drip at 1150units/hr and recheck HL with AM labs.   1/23 HL @ 0528: 0.39 Will continue heparin drip at 1150units.hr and recheck HL with AM labs   1/24 HL  @0828 : 0.54.  Continue heparin drip at 1150 units and recheck HL with AM labs.   WARFARIN: 1/20: Will initiate warfarin 544mPO x1. Will monitor INR closely as patient is on Cipro and Flagyl which can increase the INR. Ordered INR for tomorrow morning.  1/21 INR= 1.07 . Will give Warfarin 5 mg x1 again today. If INR does not increase >1.5 tomorrow then anticipate giving 7.36m1mhen. Patient on CIPRO and METRONIDAZOLE- significant drug interactions with Warfarin.  1/22 INR: 1.26. Will Give Warfarin 7.5 mg today. Should begin seeing increase in INR  Tomorrow. Patient still on Cipro and Flagyl. Will continue to monitor INR daily and dose per consult.   1/23 INR: 1.70. Will give Warfarin 36mg43mday. Patient still on Cipro and Flagyl. Will continue to monitor INR daily.   1/24 INR 1.90. Will give warfarin 36mg 22min today. Will continue to monitor INR daily while inpatient. Recommend D/C on warfarin 36mg [id number] and following up with outpatient provider for monitoring of INR.     SheemaPernell DupremD, BCPS Clinical Pharmacist 11/04/2016 11:35 AM

## 2016-11-04 NOTE — Telephone Encounter (Signed)
Do you still have the referral that you sent to [facility]? Pt is requesting [place] to resend it and put URGENT request as his appt is scheduled in early March and he needs a sooner appt.

## 2016-11-05 NOTE — Telephone Encounter (Signed)
Pt has been notified of new appt date, location and time. He was also advised they will mail him the appointment information.

## 2016-11-05 NOTE — Telephone Encounter (Signed)
Please advise patient that we were able to move his appointment. The soonest available was 12/01/16 @ 2:30pm at the [facility] location.   He will need to arrive at 2:15pm [place] [address] [address] With Dr. Halina Andreas.  They will also mail the patient directions.

## 2016-11-10 ENCOUNTER — Ambulatory Visit (INDEPENDENT_AMBULATORY_CARE_PROVIDER_SITE_OTHER): Payer: Medicare Other | Admitting: Gastroenterology

## 2016-11-10 ENCOUNTER — Other Ambulatory Visit: Payer: Self-pay | Admitting: *Deleted

## 2016-11-10 ENCOUNTER — Encounter: Payer: Self-pay | Admitting: Gastroenterology

## 2016-11-10 VITALS — BP 110/66 | HR 80 | Ht 72.0 in | Wt 178.0 lb

## 2016-11-10 DIAGNOSIS — K5100 Ulcerative (chronic) pancolitis without complications: Secondary | ICD-10-CM | POA: Diagnosis not present

## 2016-11-10 NOTE — Progress Notes (Signed)
Primary Care Physician: Madelyn Brunner, MD  Primary Gastroenterologist:  Dr. [name]   No chief complaint on file.   HPI: David [name] is a 71 y.o. male   He is here today to follow up to his recent hospital discharge. He was admitted on 10/28/15  Summary of history :  He has had ulcerative colitis from 2007 . Tried cyclosporine in 09/2006 ,recalls was in ICU at 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 Remicaid d/c in 2009 due to the malignant skin lesions. Since 2009 Not been on any medications.Since 2009 Was being followed by [facility] till 2015 when his doctor retired. Hospitalized in 2011 for flare of the colitis. His symptoms returned in early December . I performed a sigmoidoscopy on 09/25/16 and I noted moderate to severe left sided colitis. Biopsies  Confirmed marked active colitis negative for HSV and CMVCommenced him on mesalamine which he did not tolerate and was admitted to the hospital with symptoms. He was treated with oral steroids with resolution of rectal bleeding and discharged on 10/02/16. C diff negative 09/28/16 .CT abdomen confirmed on 09/28/16 diffuse colonic wall thickening from proximal transverse colon to rectum. 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 From the colitis point of view he was stable. Restarted on prednisone after a few days of antibiotics. Likely he had the clot secondary to colitis and subsequently the clot may have been infected. We wanted to transfer him to a tertiary center but he refused .      Interval history - since discharge   Soft stool , no bleeding , 2 bowel movements a day . On 25 mg or prednisone. .  Has an appointment at [facility]. On coumadin- says his INR was 3.6 - has had a change in dose yesterday. Completing his course of antibiotics.   Mild left sided "soreness" when stool passes through.   Current Outpatient Prescriptions  Medication Sig Dispense Refill  . ACCU-CHEK AVIVA PLUS test strip 1 each by Other route as needed for other.     Marland Kitchen aspirin EC 81 MG tablet Take 81 mg by mouth 2 (two) times daily.     Marland Kitchen atorvastatin (LIPITOR) 40 MG tablet Take 40 mg by mouth daily.     . carvedilol (COREG) 3.125 MG tablet Take 3.125 mg by mouth 2 (two) times daily with a meal.     . Cholecalciferol (VITAMIN D) 2000 units CAPS Take 1 capsule by mouth daily.     . ciprofloxacin (CIPRO) 500 MG tablet Take 1 tablet (500 mg total) by mouth 2 (two) times daily. 12 tablet 0  . feeding supplement, ENSURE ENLIVE, (ENSURE ENLIVE) LIQD Take 237 mLs by mouth 3 (three) times daily between meals. 237 mL 12  . glipiZIDE (GLUCOTROL) 10 MG tablet Take 10 mg by mouth 2 (two) times daily.     Marland Kitchen levothyroxine (SYNTHROID, LEVOTHROID) 50 MCG tablet Take 50 mcg by mouth daily before breakfast.     . metroNIDAZOLE (FLAGYL) 500 MG tablet Take 1 tablet (500 mg total)  by mouth every 12 (twelve) hours. 12 tablet 0  . Multiple Vitamins-Minerals (MULTIVITAMIN ADULT PO) Take 1 tablet by mouth daily.     . Omega-3 Fatty Acids (FISH OIL PO) Take 1 capsule by mouth 2 (two) times daily.     Marland Kitchen omeprazole (PRILOSEC) 20 MG capsule Take 20 mg by mouth daily.     . predniSONE (DELTASONE) 10 MG tablet Take by mouth.    . ramipril (ALTACE) 5 MG capsule Take 5 mg by mouth daily.     Marland Kitchen warfarin (COUMADIN) 4 MG tablet Take 1 tablet (4 mg total) by mouth daily at 6 PM. 15 tablet 0   No current facility-administered medications for this visit.     Allergies as of 11/10/2016 - Review Complete 10/27/2016  Allergen  Reaction Noted  . Codeine Nausea And Vomiting and Other (See Comments)     ROS:  General: Negative for anorexia, weight loss, fever, chills, fatigue, weakness. ENT: Negative for hoarseness, difficulty swallowing , nasal congestion. CV: Negative for chest pain, angina, palpitations, dyspnea on exertion, peripheral edema.  Respiratory: Negative for dyspnea at rest, dyspnea on exertion, cough, sputum, wheezing.  GI: See history of present illness. GU:  Negative for dysuria, hematuria, urinary incontinence, urinary frequency, nocturnal urination.  Endo: Negative for unusual weight change.    Physical Examination:   There were no vitals taken for this visit.  General: Well-nourished, well-developed in no acute distress.  Eyes: No icterus. Conjunctivae pink. Mouth: Oropharyngeal mucosa moist and pink , no lesions erythema or exudate. Lungs: Clear to auscultation bilaterally. Non-labored. Heart: Regular rate and rhythm, no murmurs rubs or gallops.  Abdomen: Bowel sounds are normal, nontender, nondistended, no hepatosplenomegaly or masses, no abdominal bruits or hernia , no rebound or guarding.   Extremities: No lower extremity edema. No clubbing or deformities. Neuro: Alert and oriented x 3.  Grossly intact. Skin: Warm and dry, no jaundice.   Psych: Alert and cooperative, normal mood and affect.  Imaging Studies: Dg Chest 2 View  Result Date: 10/26/2016 CLINICAL DATA:  Cough for 2 weeks with fever today. History of ulcerative colitis. EXAM: CHEST  2 VIEW COMPARISON:  Acute abdominal series done 09/29/2016. FINDINGS: The heart size and mediastinal contours are stable. There is a probable coronary artery stent. The lungs are clear. There is no pleural effusion or pneumothorax. Probable old rib fracture laterally on the right. No acute osseous findings are seen. IMPRESSION: No active cardiopulmonary process. Electronically Signed   By: Richardean Sale M.D.   On: 10/26/2016 18:43   Ct Abdomen  Pelvis W Contrast  Result Date: 10/26/2016 CLINICAL DATA:  Malaise, chills. History of ulcerative colitis and has had a flare for the past 3-4 weeks. Patient is on steroids. EXAM: CT ABDOMEN AND PELVIS WITH CONTRAST TECHNIQUE: Multidetector CT imaging of the abdomen and pelvis was performed using the standard protocol following bolus administration of intravenous contrast. CONTRAST:  131m ISOVUE-300 IOPAMIDOL (ISOVUE-300) INJECTION 61% COMPARISON:  09/28/2016 CT FINDINGS: Lower chest: Small hiatal hernia. Top normal-sized cardiac chambers with coronary arteriosclerosis. Bibasilar atelectasis. Minimal chronic right middle lobe scarring. No effusion or pneumothorax. Stable 7 mm right lower lobe lateral nodule. Hepatobiliary: Cholecystectomy with mild intrahepatic ductal dilatation which can be seen in the setting prior cholecystectomy. No choledocholithiasis. No space-occupying mass of the liver. Pancreas: Unremarkable. No pancreatic ductal dilatation or surrounding inflammatory changes. Spleen: Normal in size without focal abnormality. Adrenals/Urinary Tract: Adrenal glands are unremarkable. Kidneys are normal, without renal calculi, focal lesion, or hydronephrosis.  Bladder is unremarkable. Stomach/Bowel: Stomach is moderately distended with contrast. Normal small bowel rotation. No small bowel dilatation to suggest obstruction. Normal-appearing appendix. Transmural thickening of large bowel mid transverse colon through rectum consistent with mild colitis. Vascular/Lymphatic: Acute appearing thrombosis of the proximal inferior mesenteric vein with distention noted along its proximal half draining the sigmoid and rectosigmoid veins terminating at the left common vein, series 5 images 51 through 68. There is mesenteric fat stranding associated with this finding. No lymphadenopathy. Reproductive: Mild prostatomegaly with peripheral zone calcifications. Other: Tiny fat containing umbilical hernia. No abdominopelvic  ascites. Musculoskeletal: Degenerative changes of the lumbar spine appear chronic. No acute osseous abnormality. IMPRESSION: 1. 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 2. Diffuse transmural thickening of proximal transverse colon through rectum consistent with colitis. 3. Otherwise chronic stable appearing changes as above. Electronically Signed   By: Ashley Royalty M.D.   On: 10/26/2016 23:15    Assessment and Plan:   SWADE SHONKA is a 71 y.o. y/o male with  a history of long standing ulcerative colitis- off treatment for many years and recent onset of symptoms while off all therapy . Due to prior history of skin cancer (melanoma and he recalls basal cell ca) in the past ,options of treatment are more limited due to increased risk of skin cancers associated with Anti TNF and relatively less data available on the newer agents such as entyvio, options such as Imuran too increase risk of non melanomatous skin cancer. Tb quantiferon is indeterminate  But he has no overt signs of TB He has had .3  hospital admission over the past few weeks for his colitis.He definetly needs stepping up of treatment . I have explained to him on numerous occasions in addition by our surgeons here at [facility]  that due to the complex nature of his situation , I have referred him  to [place] to a specialized IBD center as they would have more experience treating individuals with severe colitis and prior history of skin cancer who require biologic agents. I had also explained that there is a possibility of requiring surgery in terms of a colectomy if he fails treatment oand that we do not have colorectal surgery here at [facility] . 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 His appointment at [facility] is on 12/01/16   F.u in 2 weeks will decide on dose of prednisone he needs to stay till he transitions to next level of treatment.   Dr [name]  MD

## 2016-11-19 DIAGNOSIS — Z515 Encounter for palliative care: Secondary | ICD-10-CM

## 2016-11-19 DIAGNOSIS — Z7189 Other specified counseling: Secondary | ICD-10-CM

## 2016-11-24 ENCOUNTER — Telehealth: Payer: Self-pay

## 2016-11-24 ENCOUNTER — Encounter: Payer: Self-pay | Admitting: Gastroenterology

## 2016-11-24 ENCOUNTER — Ambulatory Visit (INDEPENDENT_AMBULATORY_CARE_PROVIDER_SITE_OTHER): Payer: Medicare Other | Admitting: Gastroenterology

## 2016-11-24 VITALS — BP 110/72 | HR 75 | Ht 72.0 in | Wt 176.0 lb

## 2016-11-24 DIAGNOSIS — K51919 Ulcerative colitis, unspecified with unspecified complications: Secondary | ICD-10-CM | POA: Diagnosis not present

## 2016-11-24 MED ORDER — PREDNISONE 20 MG PO TABS: 20.0000 mg | ORAL_TABLET | Freq: Every day | ORAL | 0 refills | Status: DC

## 2016-11-24 NOTE — Telephone Encounter (Signed)
Patient has a card saying that he has an appiuntment today at 2:30 with Dr. Vicente Males. He is calling because he hasn't gotten a reminder phone call. I looked in the system and He is not on the schedule. Please call patient and advice if he can be seen this week

## 2016-11-24 NOTE — Progress Notes (Signed)
Primary Care Physician: Madelyn Brunner, MD  Primary Gastroenterologist:  Dr. [name]   Chief Complaint  Patient presents with  . Follow-up    new issues    HPI: David [name] is a 71 y.o. male .  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 Last seen on 11/10/16 .   Summary of history :  Hehas had ulcerative colitis from 2007 . 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 Remicaid d/c in 2009 due to the malignant skin lesions. Since 2009 Not been on any medications.Since 2009 Was being followed by [facility] till 2015 when his doctor retired. Hospitalized in 2011 for flare of the colitis. His symptoms returned in early December . I performed a sigmoidoscopy on 09/25/16 and I noted moderate to severe left sided colitis. Biopsies Confirmed marked active colitis negative for HSV and CMVCommenced him on mesalamine which he did not tolerate and was admitted to the hospital with symptoms. He was treated with oral steroids with resolution of rectal bleeding and discharged on 10/02/16. C diff negative 09/28/16 .CT abdomen confirmed on 09/28/16 diffuse colonic wall thickening from proximal transverse colon to rectum. 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 From the colitis point of view he was stable. Restarted on prednisone after a few days of antibiotics. Likely he had the clot secondary to colitis and subsequently  the clot may have been infected. We wanted to transfer him to a tertiary center but he refused .    Interval history 11/10/16-11/24/16  Was doing well , went down to 20 mg prednisone- noticed passing lot of mucus, having a bowel movement 1-2 times a day , no cramping . Some discomfort.  Says last INR 1.4  Has an appointment at [facility] on 12/01/16 . On coumadin- says his INR was 3.6 - has had a change in dose yesterday.Completed all his antibiotics. appetite is very good.   BP 110/72   Pulse 75   Ht 6' (1.829 m)   Wt 176 lb (79.8 kg)   BMI 23.87 kg/m     Current Outpatient Prescriptions  Medication Sig Dispense Refill  . ACCU-CHEK AVIVA PLUS test strip 1 each by Other route as needed for other.     Marland Kitchen aspirin EC 81 MG tablet Take 81 mg by mouth 2 (two) times daily.     Marland Kitchen atorvastatin (LIPITOR) 40 MG tablet Take 40 mg by mouth daily.     . carvedilol (COREG) 3.125 MG tablet Take 3.125 mg by mouth 2 (two) times daily with a meal.     . Cholecalciferol (VITAMIN D) 2000 units CAPS Take 1 capsule by mouth daily.     . feeding supplement, ENSURE ENLIVE, (ENSURE ENLIVE) LIQD Take 237 mLs by mouth 3 (three) times daily between meals. 237 mL 12  . glipiZIDE (GLUCOTROL) 10 MG tablet Take 10 mg by mouth 2 (two) times daily.     Marland Kitchen  levothyroxine (SYNTHROID, LEVOTHROID) 50 MCG tablet Take 50 mcg by mouth daily before breakfast.     . Multiple Vitamins-Minerals (MULTIVITAMIN ADULT PO) Take 1 tablet by mouth daily.     . Omega-3 Fatty Acids (FISH OIL PO) Take 1 capsule by mouth 2 (two) times daily.     Marland Kitchen omeprazole (PRILOSEC) 20 MG capsule Take 20 mg by mouth daily.     . PredniSONE (DELTASONE PO) Take by mouth daily. Prednisone is Tapered: 25 mg tablet for 1 week then 20 mg tablet following week    . ramipril (ALTACE) 5 MG capsule Take 5 mg by mouth daily.     . Warfarin Sodium (COUMADIN PO) Take by mouth daily. Coumadin Schedule is Alternated  Daily  @@ 6 pm 2 mg one day Following day 4 mg then 2 mg next  day 4 mg alternate daily     No current facility-administered medications for this visit.     Allergies as of 11/24/2016 - Review Complete 11/24/2016  Allergen Reaction Noted  . Codeine Nausea And Vomiting and Other (See Comments)     ROS:  General: Negative for anorexia, weight loss, fever, chills, fatigue, weakness. ENT: Negative for hoarseness, difficulty swallowing , nasal congestion. CV: Negative for chest pain, angina, palpitations, dyspnea on exertion, peripheral edema.  Respiratory: Negative for dyspnea at rest, dyspnea on exertion, cough, sputum, wheezing.  GI: See history of present illness. GU:  Negative for dysuria, hematuria, urinary incontinence, urinary frequency, nocturnal urination.  Endo: Negative for unusual weight change.    Physical Examination:   BP 110/72   Pulse 75   Ht 6' (1.829 m)   Wt 176 lb (79.8 kg)   BMI 23.87 kg/m   General: Well-nourished, well-developed in no acute distress.  Eyes: No icterus. Conjunctivae pink. Mouth: Oropharyngeal mucosa moist and pink , no lesions erythema or exudate. Lungs: Clear to auscultation bilaterally. Non-labored. Heart: Regular rate and rhythm, no murmurs rubs or gallops.  Abdomen: Bowel sounds are normal, nontender, nondistended, no hepatosplenomegaly or masses, no abdominal bruits or hernia , no rebound or guarding.   Extremities: No lower extremity edema. No clubbing or deformities. Neuro: Alert and oriented x 3.  Grossly intact. Skin: Warm and dry, no jaundice.   Psych: Alert and cooperative, normal mood and affect.  Imaging Studies: Dg Chest 2 View  Result Date: 10/26/2016 CLINICAL DATA:  Cough for 2 weeks with fever today. History of ulcerative colitis. EXAM: CHEST  2 VIEW COMPARISON:  Acute abdominal series done 09/29/2016. FINDINGS: The heart size and mediastinal contours are stable. There is a probable coronary artery stent. The lungs are clear. There is no pleural effusion or pneumothorax. Probable old  rib fracture laterally on the right. No acute osseous findings are seen. IMPRESSION: No active cardiopulmonary process. Electronically Signed   By: Richardean Sale M.D.   On: 10/26/2016 18:43   Ct Abdomen Pelvis W Contrast  Result Date: 10/26/2016 CLINICAL DATA:  Malaise, chills. History of ulcerative colitis and has had a flare for the past 3-4 weeks. Patient is on steroids. EXAM: CT ABDOMEN AND PELVIS WITH CONTRAST TECHNIQUE: Multidetector CT imaging of the abdomen and pelvis was performed using the standard protocol following bolus administration of intravenous contrast. CONTRAST:  178m ISOVUE-300 IOPAMIDOL (ISOVUE-300) INJECTION 61% COMPARISON:  09/28/2016 CT FINDINGS: Lower chest: Small hiatal hernia. Top normal-sized cardiac chambers with coronary arteriosclerosis. Bibasilar atelectasis. Minimal chronic right middle lobe scarring. No effusion or pneumothorax. Stable 7 mm right lower lobe lateral nodule. Hepatobiliary:  Cholecystectomy with mild intrahepatic ductal dilatation which can be seen in the setting prior cholecystectomy. No choledocholithiasis. No space-occupying mass of the liver. Pancreas: Unremarkable. No pancreatic ductal dilatation or surrounding inflammatory changes. Spleen: Normal in size without focal abnormality. Adrenals/Urinary Tract: Adrenal glands are unremarkable. Kidneys are normal, without renal calculi, focal lesion, or hydronephrosis. Bladder is unremarkable. Stomach/Bowel: Stomach is moderately distended with contrast. Normal small bowel rotation. No small bowel dilatation to suggest obstruction. Normal-appearing appendix. Transmural thickening of large bowel mid transverse colon through rectum consistent with mild colitis. Vascular/Lymphatic: Acute appearing thrombosis of the proximal inferior mesenteric vein with distention noted along its proximal half draining the sigmoid and rectosigmoid veins terminating at the left common vein, series 5 images 51 through 68. There is  mesenteric fat stranding associated with this finding. No lymphadenopathy. Reproductive: Mild prostatomegaly with peripheral zone calcifications. Other: Tiny fat containing umbilical hernia. No abdominopelvic ascites. Musculoskeletal: Degenerative changes of the lumbar spine appear chronic. No acute osseous abnormality. IMPRESSION: 1. 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 2. Diffuse transmural thickening of proximal transverse colon through rectum consistent with colitis. 3. Otherwise chronic stable appearing changes as above. Electronically Signed   By: Ashley Royalty M.D.   On: 10/26/2016 23:15    Assessment and Plan:   BRALYN FOLKERT a 71 y.o.y/o malewith a history of long standing ulcerative colitis- off treatment for many years and recent onset of symptoms while off all therapy . Due to prior history of skin cancer (melanoma and he recalls basal cell ca) in the past ,options of treatment are more limiteddue to increased risk of skin cancers associated with Anti TNF and relatively less data available on the newer agents such as entyvio, options such as Imuran too increase risk of non melanomatous skin cancer. Tb quantiferon is indeterminate  But he has no overt signs of TB He has had .3  hospital admission over the past few months for his colitis.He needs stepping up of treatment . I have explained to him on numerous occasions in addition by our surgeons here at [facility]  that due to the complex nature of his situation , I have referred him to [facility] to a specialized IBD center as they would have more experience treating individuals with severe colitis and prior history of skin cancer who require biologic agents. I had also explained that there is a possibility of requiring surgery in terms of a colectomy if he fails treatment and that we do not have colorectal surgery here at [facility] . He is presently on anticoagulation for the SMV clotHis [facility].  When he  cut down on the dose of his steroids from 25 to 20 mg felt that he was passing more mucus and felt  Not as well as when he was on higher dose of steroids. Presently only 1 bowel movement a day , no rectal bleeding . I will leave him on 20 mg of prednisone for another week till I see him to further lower dose. Based on [facility] advise will plan for subsequent therapy.   I will check CBC,CRP   Dr [name]  MD F/u in 1 weeks after appointment with [facility]

## 2016-11-25 ENCOUNTER — Telehealth: Payer: Self-pay

## 2016-11-25 ENCOUNTER — Other Ambulatory Visit: Payer: Self-pay

## 2016-11-25 MED ORDER — PREDNISONE 20 MG PO TABS: 20.0000 mg | ORAL_TABLET | Freq: Every day | ORAL | 0 refills | Status: DC

## 2016-11-25 NOTE — Telephone Encounter (Signed)
Pt was added to schedule on 11/24/16 with Vicente Males.

## 2016-11-25 NOTE — Telephone Encounter (Signed)
Contacted pt and advised prednisone 61m has been sent to pt's pharmacy.

## 2016-11-25 NOTE — Telephone Encounter (Signed)
David [name] is calling because he saw Dr. Vicente Males and the nurse was suppose to send a prescription to his pharmacy. His pharmacy won't fill it because it says take 77m with 261m that's a total of 4535mf Prednisone. Please resend the correct medication of Prednisone 62m43m [facility] pharmacy[facility] [place] [facility] [place]

## 2016-11-26 ENCOUNTER — Other Ambulatory Visit
Admission: RE | Admit: 2016-11-26 | Discharge: 2016-11-26 | Disposition: A | Discharge status: Home / Self Care | Payer: Medicare Other | Source: Ambulatory Visit | Attending: Gastroenterology | Admitting: Gastroenterology

## 2016-11-26 DIAGNOSIS — K51919 Ulcerative colitis, unspecified with unspecified complications: Secondary | ICD-10-CM | POA: Insufficient documentation

## 2016-11-26 LAB — CBC WITH DIFFERENTIAL/PLATELET
BASOS ABS: 0 10*3/uL (ref 0–0.1)
BASOS PCT: 0 %
EOS ABS: 0 10*3/uL (ref 0–0.7)
EOS PCT: 1 %
HEMATOCRIT: 34.5 % — AB (ref 40.0–52.0)
Hemoglobin: 12.1 g/dL — ABNORMAL LOW (ref 13.0–18.0)
Lymphocytes Relative: 16 %
Lymphs Abs: 1.2 10*3/uL (ref 1.0–3.6)
MCH: 31.5 pg (ref 26.0–34.0)
MCHC: 35 g/dL (ref 32.0–36.0)
MCV: 90.1 fL (ref 80.0–100.0)
MONO ABS: 0.5 10*3/uL (ref 0.2–1.0)
MONOS PCT: 7 %
NEUTROS ABS: 5.8 10*3/uL (ref 1.4–6.5)
Neutrophils Relative %: 76 %
PLATELETS: 282 10*3/uL (ref 150–440)
RBC: 3.83 MIL/uL — ABNORMAL LOW (ref 4.40–5.90)
RDW: 15 % — AB (ref 11.5–14.5)
WBC: 7.6 10*3/uL (ref 3.8–10.6)

## 2016-11-26 LAB — C-REACTIVE PROTEIN: CRP: 1.7 mg/dL — AB (ref <1.0)

## 2016-11-27 LAB — MISC LABCORP TEST (SEND OUT): Labcorp test code: 503772

## 2016-12-02 ENCOUNTER — Telehealth: Payer: Self-pay

## 2016-12-02 NOTE — Telephone Encounter (Signed)
-----   Message from [name], MD sent at 11/26/2016 11:22 AM EST ----- Hb has improved

## 2016-12-02 NOTE — Telephone Encounter (Signed)
Pt aware.

## 2016-12-03 ENCOUNTER — Encounter: Payer: Self-pay | Admitting: Gastroenterology

## 2016-12-03 ENCOUNTER — Ambulatory Visit (INDEPENDENT_AMBULATORY_CARE_PROVIDER_SITE_OTHER): Payer: Medicare Other | Admitting: Gastroenterology

## 2016-12-03 VITALS — BP 124/74 | HR 76 | Wt 177.0 lb

## 2016-12-03 DIAGNOSIS — K51919 Ulcerative colitis, unspecified with unspecified complications: Secondary | ICD-10-CM | POA: Diagnosis not present

## 2016-12-03 MED ORDER — FERROUS SULFATE 325 (65 FE) MG PO TABS: 325.0000 mg | ORAL_TABLET | Freq: Two times a day (BID) | ORAL | 0 refills | Status: DC

## 2016-12-03 NOTE — Progress Notes (Signed)
Primary Care Physician: Madelyn Brunner, MD  Primary Gastroenterologist:  Dr. [name]   Chief Complaint  Patient presents with  . Follow-up    [facility] Appointment    HPI: David [name] is a 71 y.o. male . 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 Last seen on 11/24/16 .   Summary of history :  Hehas had ulcerative colitis from 2007 . 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 Remicaid d/c in 2009 due to the malignant skin lesions. Since 2009 Not been on any medications. He was being followed by [facility] till 2015 when his doctor retired. Hospitalized in 2011 for flare of the colitis. His symptoms returned in early December . I performed a sigmoidoscopy on 09/25/16 and I noted moderate to severe left sided colitis. Biopsies Confirmed marked active colitis negative for HSV and CMV,Commenced him on mesalamine which he did not tolerate and was admitted to the hospital with symptoms. 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 From the colitis point of view he was stable. Restarted on prednisone after a few days of antibiotics. Likely he had the clot secondary to colitis and subsequently the clot may have been infected. We wanted to  transfer him to a tertiary center but he refused .    Interval history 11/24/16-12/03/16  He was seen at [facility] Dr Daphane Shepherd who specializes in IBD for a second opinion as he has had prior skin cancer with regards to options. She saw him on 12/01/16 and I spoke to her after to discuss his options . He is having a bowel movement 1-2 times a day , no cramping or rectal bleeding  . On coumadin. On prednisone 20 mg a day .   Current Outpatient Prescriptions  Medication Sig Dispense Refill  . ACCU-CHEK AVIVA PLUS test strip 1 each by Other route as needed for other.     Marland Kitchen aspirin EC 81 MG tablet Take 81 mg by mouth 2 (two) times daily.     Marland Kitchen atorvastatin (LIPITOR) 40 MG tablet Take 40 mg by mouth daily.     . carvedilol (COREG) 3.125 MG tablet Take 3.125 mg by mouth 2 (two) times daily with a meal.     . Cholecalciferol (VITAMIN D) 2000 units CAPS Take 1 capsule by mouth daily.     . feeding supplement, ENSURE ENLIVE, (ENSURE ENLIVE) LIQD Take 237 mLs by mouth 3 (three) times daily between meals. 237 mL 12  . glipiZIDE (GLUCOTROL) 10 MG tablet Take 10 mg by mouth 2 (two) times daily.     Marland Kitchen levothyroxine (SYNTHROID, LEVOTHROID) 50 MCG tablet Take 50 mcg by mouth daily before breakfast.     . Multiple Vitamins-Minerals (MULTIVITAMIN ADULT PO) Take 1 tablet by mouth daily.     Marland Kitchen  Omega-3 Fatty Acids (FISH OIL PO) Take 1 capsule by mouth 2 (two) times daily.     Marland Kitchen omeprazole (PRILOSEC) 20 MG capsule Take 20 mg by mouth daily.     . predniSONE (DELTASONE) 20 MG tablet Take 1 tablet (20 mg total) by mouth daily. 30 tablet 0  . ramipril (ALTACE) 5 MG capsule Take 5 mg by mouth daily.     . Warfarin Sodium (COUMADIN PO) Take by mouth daily. Coumadin Schedule is Alternated  Daily  @@ 6 pm 2 mg one day Following day 4 mg then 2 mg next day 4 mg alternate daily     No current facility-administered medications for this visit.     Allergies as of 12/03/2016 - Review Complete 12/03/2016  Allergen Reaction Noted   . Codeine Nausea And Vomiting and Other (See Comments)     ROS:  General: Negative for anorexia, weight loss, fever, chills, fatigue, weakness. ENT: Negative for hoarseness, difficulty swallowing , nasal congestion. CV: Negative for chest pain, angina, palpitations, dyspnea on exertion, peripheral edema.  Respiratory: Negative for dyspnea at rest, dyspnea on exertion, cough, sputum, wheezing.  GI: See history of present illness. GU:  Negative for dysuria, hematuria, urinary incontinence, urinary frequency, nocturnal urination.  Endo: Negative for unusual weight change.    Physical Examination:   BP 124/74   Pulse 76   Wt 177 lb (80.3 kg)   BMI 24.01 kg/m   General: Well-nourished, well-developed in no acute distress.  Eyes: No icterus. Conjunctivae pink. Mouth: Oropharyngeal mucosa moist and pink , no lesions erythema or exudate. Lungs: Clear to auscultation bilaterally. Non-labored. Heart: Regular rate and rhythm, no murmurs rubs or gallops.  Abdomen: Bowel sounds are normal, nontender, nondistended, no hepatosplenomegaly or masses, no abdominal bruits or hernia , no rebound or guarding.   Extremities: No lower extremity edema. No clubbing or deformities. Neuro: Alert and oriented x 3.  Grossly intact. Skin: Warm and dry, no jaundice.   Psych: Alert and cooperative, normal mood and affect.   Imaging Studies: No results found.  Assessment and Plan:   David [name] a 71 y.o.y/o malewith a history of long standing ulcerative colitis- off treatment for many years and recent onset of symptoms while off all therapy . Due to prior history of skin cancer (melanoma and he recalls basal cell ca) in the past ,options of treatment are more limiteddue to increased risk of skin cancers associated with Anti TNF and relatively less data available on the newer agents such as entyvio, options such as Imuran too increase risk of non melanomatous skin cancer. Tb quantiferon is  indeterminate But he has no overt signs of TB He has had .3 hospital admission over the past few months for his colitis.He needs stepping up of treatment .  He is presently on anticoagulation for the SMV clot.   He was seen by Dr Daphane Shepherd at [facility] who specializes in IBD for a second opinion and we discussed his case after. Options are colectomy which he is not keen, use of Vedolizumab which has least possibility of skin cancer risks but takes longer to act  and infliximab which works faster but has higher risk of skin cancer. Based on the options available. He has chosen to commence on infliximab, he is aware of possible infusion reactions, risk of immunosupression , patient information provided . 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 He has tested negative for  hepatitis B/C, HIV test on file   Plan  1.  Hep A serology and  Vitamin D levels with next labs  2. Infliximab to be commenced 3. Levels to be checked at week 14 to determine dose escalation if needed 4. Yearly skin exam  5. CBC,BMP,LFT,CRP 1 week after first dose 6. .F/u with me 2 weeks after 1st dose of remicaid 7 .He will decrease prednisone to 15 mg a day today   Dr [name]  MD

## 2016-12-03 NOTE — Patient Instructions (Signed)
Infliximab injection What is this medicine? INFLIXIMAB (in [place] i mab) is used to treat Crohn's disease and ulcerative colitis. It is also used to treat ankylosing spondylitis, psoriasis, and some forms of arthritis. This medicine may be used for other purposes; ask your health care provider or pharmacist if you have questions. COMMON BRAND NAME(S): INFLECTRA, Remicade, RENFLEXIS What should I tell my health care provider before I take this medicine? They need to know if you have any of these conditions: -diabetes -exposure to tuberculosis -heart failure -hepatitis or liver disease -immune system problems -infection -lung or breathing disease, like COPD -multiple sclerosis -current or past resident of [place] or [place] -seizure disorder -an unusual or allergic reaction to infliximab, mouse proteins, other medicines, foods, dyes, or preservatives -pregnant or trying to get pregnant -breast-feeding How should I use this medicine? This medicine is for injection into a vein. It is usually given by a health care professional in a hospital or clinic setting. A special MedGuide will be given to you by the pharmacist with each prescription and refill. Be sure to read this information carefully each time. Talk to your pediatrician regarding the use of this medicine in children. Special care may be needed. Overdosage: If you think you have taken too much of this medicine contact a poison control center or emergency room at once. NOTE: This medicine is only for you. Do not share this medicine with others. What if I miss a dose? It is important not to miss your dose. Call your doctor or health care professional if you are unable to keep an appointment. What may interact with this medicine? Do not take this medicine with any of the following medications: -anakinra -rilonacept This medicine may also interact with the following medications: -vaccines This list may not describe all  possible interactions. Give your health care provider a list of all the medicines, herbs, non-prescription drugs, or dietary supplements you use. Also tell them if you smoke, drink alcohol, or use illegal drugs. Some items may interact with your medicine. What should I watch for while using this medicine? Visit your doctor or health care professional for regular checks on your progress. If you get a cold or other infection while receiving this medicine, call your doctor or health care professional. Do not treat yourself. This medicine may decrease your body's ability to fight infections. Before beginning therapy, your doctor may do a test to see if you have been exposed to tuberculosis. This medicine may make the symptoms of heart failure worse in some patients. If you notice symptoms such as increased shortness of breath or swelling of the ankles or legs, contact your health care provider right away. If you are going to have surgery or dental work, tell your health care professional or dentist that you have received this medicine. If you take this medicine for plaque psoriasis, stay out of the sun. If you cannot avoid being in the sun, wear protective clothing and use sunscreen. Do not use sun lamps or tanning beds/booths. What side effects may I notice from receiving this medicine? Side effects that you should report to your doctor or health care professional as soon as possible: -allergic reactions like skin rash, itching or hives, swelling of the face, lips, or tongue -chest pain -fever or chills, usually related to the infusion -muscle or joint pain -red, scaly patches or raised bumps on the skin -signs of infection - fever or chills, cough, sore throat, pain or difficulty passing urine -swollen  lymph nodes in the neck, underarm, or groin areas -unexplained weight loss -unusual bleeding or bruising -unusually weak or tired -yellowing of the eyes or skin Side effects that usually do not  require medical attention (report to your doctor or health care professional if they continue or are bothersome): -headache -heartburn or stomach pain -nausea, vomiting This list may not describe all possible side effects. Call your doctor for medical advice about side effects. You may report side effects to FDA at 1-800-FDA-1088. Where should I keep my medicine? This drug is given in a hospital or clinic and will not be stored at home. NOTE: This sheet is a summary. It may not cover all possible information. If you have questions about this medicine, talk to your doctor, pharmacist, or health care provider.  2017 Elsevier/Gold Standard (2008-05-16 10:26:02)

## 2016-12-23 ENCOUNTER — Telehealth: Payer: Self-pay | Admitting: Gastroenterology

## 2016-12-23 NOTE — Telephone Encounter (Signed)
Patients daughter called and would like for you to call Mr. Whitener regarding treatment. They are very concerned for him.

## 2016-12-24 ENCOUNTER — Other Ambulatory Visit: Payer: Self-pay

## 2016-12-24 DIAGNOSIS — K51019 Ulcerative (chronic) pancolitis with unspecified complications: Secondary | ICD-10-CM

## 2016-12-25 ENCOUNTER — Other Ambulatory Visit: Payer: Self-pay

## 2016-12-25 DIAGNOSIS — K51919 Ulcerative colitis, unspecified with unspecified complications: Secondary | ICD-10-CM

## 2016-12-25 MED ORDER — PREDNISONE 10 MG PO TABS: 10.0000 mg | ORAL_TABLET | Freq: Every day | ORAL | 0 refills | Status: DC

## 2016-12-25 NOTE — Telephone Encounter (Signed)
Advised pt of appointment with Dr. Maryjane Hurter @ [facility] on 3/23/ @ 11AM

## 2016-12-31 NOTE — Progress Notes (Signed)
David [name]  [phone] [phone]  ID: David [name] OB: Apr 29, 1946  MR#: [id number]  QIO#:[id number]  Patient Care Team: David Brunner, MD as PCP - General (Internal Medicine) David Bellows, MD as Consulting Physician (Surgery)  CHIEF COMPLAINT: Remicade infusion for Ulcerative colitis.  INTERVAL HISTORY: Patient is a 71 year old male with a long-standing history of ulcerative colitis who over the past several months has had a flare with multiple episodes of bloody diarrhea and hospital admissions. He is referred for further evaluation and Remicade infusion. He has no neurologic complaints. He denies any fevers. He has a poor appetite, but denies weight loss. He has no chest pain or shortness of breath. He denies any nausea, vomiting, or constipation. He has no urinary complaints. Patient states he feels miserable, but offers no further specific complaints.  REVIEW OF SYSTEMS:   Review of Systems  Constitutional: Positive for malaise/fatigue. Negative for fever and weight loss.  Respiratory: Negative.  Negative for cough and shortness of breath.   Cardiovascular: Negative.  Negative for chest pain and leg swelling.  Gastrointestinal: Positive for abdominal pain, blood in stool and diarrhea. Negative for nausea and vomiting.  Genitourinary: Negative.   Musculoskeletal: Negative.   Neurological: Positive for weakness.  Psychiatric/Behavioral: Positive for depression. The patient is not nervous/anxious.     As per HPI. Otherwise, a complete review of systems is negative.  PAST MEDICAL HISTORY: Past Medical History:  Diagnosis Date  . Anemia   . CAD (coronary artery disease) 07/08/2011   Overview:  a. 07/2006: Anterior ST elevation MI. b. 07/2006: PCI with BMS to LAD and RCA. c. 09/2006: Non ST elevation. d. LVEF 30%.  Discussed ICD, not currently interested.   . 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  Dyslipidemia 07/30/2011   Overview:  High triglycerides  . GERD (gastroesophageal reflux disease) 07/08/2011  . History of Clostridium difficile colitis   . History of myocardial infarction   . Hyperlipidemia, unspecified 07/08/2011  . Hypothyroidism   . 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 Type II diabetes mellitus, uncontrolled ([place]) 07/08/2011  . Vitamin D deficiency     PAST SURGICAL HISTORY: Past Surgical History:  Procedure Laterality Date  . CARDIAC CATHETERIZATION    . CHOLECYSTECTOMY    . COLONOSCOPY  07/20/2006   Crohn's disease  . CORONARY STENT PLACEMENT    . FLEXIBLE SIGMOIDOSCOPY N/A 09/25/2016   Procedure: FLEXIBLE SIGMOIDOSCOPY;  Surgeon: David Bellows, MD;  Location: [facility] ENDOSCOPY;  Service: Endoscopy;  Laterality: N/A;  . MELANOMA REMOVED    . parotid gland removal      FAMILY HISTORY: Family History  Problem Relation Age of Onset  . Heart disease Mother   . Heart attack Father   . Heart disease Sister   . Heart disease Brother     ADVANCED DIRECTIVES (Y/N):  N  HEALTH MAINTENANCE: Social History  Substance Use Topics  . Smoking status: Never Smoker  . Smokeless tobacco: Never Used  . Alcohol use No     Colonoscopy:  PAP:  Bone density:  Lipid panel:  Allergies  Allergen Reactions  . Codeine Nausea And Vomiting and Other (See Comments)  . Mesalamine Nausea Only and Nausea And Vomiting    Current Outpatient Prescriptions  Medication Sig Dispense Refill  . ACCU-CHEK AVIVA PLUS test strip 1 each by Other route as needed for other.     Marland Kitchen aspirin EC 81 MG tablet Take 81 mg by  mouth 2 (two) times daily.     Marland Kitchen atorvastatin (LIPITOR) 40 MG tablet Take 40 mg by mouth daily.     . carvedilol (COREG) 3.125 MG tablet Take 3.125 mg by mouth 2 (two) times daily with a meal.     . Cholecalciferol (VITAMIN D) 2000 units CAPS Take 1 capsule by mouth 2 (two) times daily.     Marland Kitchen glipiZIDE (GLUCOTROL) 10 MG tablet Take 10 mg by mouth 2  (two) times daily.     Marland Kitchen levothyroxine (SYNTHROID, LEVOTHROID) 50 MCG tablet Take 50 mcg by mouth daily before breakfast.     . Multiple Vitamins-Minerals (MULTIVITAMIN ADULT PO) Take 1 tablet by mouth daily.     . Omega-3 Fatty Acids (FISH OIL PO) Take 1 capsule by mouth 2 (two) times daily.     Marland Kitchen omeprazole (PRILOSEC) 20 MG capsule Take 20 mg by mouth daily.     . predniSONE (DELTASONE) 10 MG tablet Take 1 tablet (10 mg total) by mouth daily with breakfast. 100 tablet 0  . Probiotic Product (PROBIOTIC DAILY PO) Take 1 capsule by mouth 2 (two) times daily.    . ramipril (ALTACE) 5 MG capsule Take 5 mg by mouth daily.     . Warfarin Sodium (COUMADIN PO) Take by mouth daily. Coumadin Schedule is Alternated  Daily  @@ 6 pm 2 mg one day Following day 4 mg then 2 mg next day 4 mg alternate daily     No current facility-administered medications for this visit.     OBJECTIVE: Vitals:   01/01/17 1103  BP: 123/73  Pulse: 84  Resp: 18  Temp: (!) 96.6 F (35.9 C)     Body mass index is 24.95 kg/m.    ECOG FS:1 - Symptomatic but completely ambulatory  General: Well-developed, well-nourished, no acute distress. Eyes: Pink conjunctiva, anicteric sclera. HEENT: Normocephalic, moist mucous membranes, clear oropharnyx. Lungs: Clear to auscultation bilaterally. Heart: Regular rate and rhythm. No rubs, murmurs, or gallops. Abdomen: Soft, nontender, nondistended. No organomegaly noted, normoactive bowel sounds. Musculoskeletal: No edema, cyanosis, or clubbing. Neuro: Alert, answering all questions appropriately. Cranial nerves grossly intact. Skin: No rashes or petechiae noted. Psych: Normal affect. Lymphatics: No cervical, calvicular, axillary or inguinal LAD.   LAB RESULTS:  Lab Results  Component Value Date   NA 139 11/02/2016   K 3.9 11/03/2016   CL 107 11/02/2016   CO2 27 11/02/2016   GLUCOSE 143 (H) 11/02/2016   BUN 8 11/02/2016   CREATININE 0.90 11/02/2016   CALCIUM 8.1 (L)  11/02/2016   PROT 5.9 (L) 10/26/2016   ALBUMIN 1.7 (L) 10/29/2016   AST 25 10/26/2016   ALT 15 (L) 10/26/2016   ALKPHOS 121 10/26/2016   BILITOT 0.9 10/26/2016   GFRNONAA >60 11/02/2016   GFRAA >60 11/02/2016    Lab Results  Component Value Date   WBC 7.6 11/26/2016   NEUTROABS 5.8 11/26/2016   HGB 12.1 (L) 11/26/2016   HCT 34.5 (L) 11/26/2016   MCV 90.1 11/26/2016   PLT 282 11/26/2016     STUDIES: No results found.  ASSESSMENT: Remicade infusion for Ulcerative colitis.  PLAN:    1. Remicade infusion for Ulcerative colitis: Once this clinic receives orders from patient's primary GI doctor, can proceed with Remicade infusion. This is tentatively scheduled for next Tuesday. Patient expressed understanding that any questions or concerns he has regarding Remicade or his ulcerative colitis should be directed to his primary GI physician. All laboratory work will also be monitored  by them. Possible treatment next Tuesday, but no further follow-up has been scheduled pending instructions from GI. 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 Continue anticoagulation as directed.  Approximately 45 minutes was spent in discussion of which greater than 50% was consultation.  Patient expressed understanding and was in agreement with this plan. He also understands that He can call clinic at any time with any questions, concerns, or complaints.    Lloyd Huger, MD   01/01/2017 1:14 PM

## 2017-01-01 ENCOUNTER — Inpatient Hospital Stay: Payer: Medicare Other | Attending: Oncology | Admitting: Oncology

## 2017-01-01 ENCOUNTER — Telehealth: Payer: Self-pay | Admitting: Gastroenterology

## 2017-01-01 ENCOUNTER — Other Ambulatory Visit: Payer: Self-pay

## 2017-01-01 VITALS — BP 123/73 | HR 84 | Temp 96.6°F | Resp 18 | Wt 184.0 lb

## 2017-01-01 DIAGNOSIS — K219 Gastro-esophageal reflux disease without esophagitis: Secondary | ICD-10-CM

## 2017-01-01 DIAGNOSIS — Z7984 Long term (current) use of oral hypoglycemic drugs: Secondary | ICD-10-CM | POA: Diagnosis not present

## 2017-01-01 DIAGNOSIS — Z79899 Other long term (current) drug therapy: Secondary | ICD-10-CM | POA: Insufficient documentation

## 2017-01-01 DIAGNOSIS — K51919 Ulcerative colitis, unspecified with unspecified complications: Secondary | ICD-10-CM | POA: Insufficient documentation

## 2017-01-01 DIAGNOSIS — Z86718 Personal history of other venous thrombosis and embolism: Secondary | ICD-10-CM | POA: Insufficient documentation

## 2017-01-01 DIAGNOSIS — E119 Type 2 diabetes mellitus without complications: Secondary | ICD-10-CM | POA: Insufficient documentation

## 2017-01-01 DIAGNOSIS — F329 Major depressive disorder, single episode, unspecified: Secondary | ICD-10-CM | POA: Diagnosis not present

## 2017-01-01 DIAGNOSIS — R5383 Other fatigue: Secondary | ICD-10-CM | POA: Insufficient documentation

## 2017-01-01 DIAGNOSIS — Z7901 Long term (current) use of anticoagulants: Secondary | ICD-10-CM | POA: Diagnosis not present

## 2017-01-01 DIAGNOSIS — I252 Old myocardial infarction: Secondary | ICD-10-CM

## 2017-01-01 DIAGNOSIS — R531 Weakness: Secondary | ICD-10-CM | POA: Insufficient documentation

## 2017-01-01 DIAGNOSIS — Z7982 Long term (current) use of aspirin: Secondary | ICD-10-CM | POA: Insufficient documentation

## 2017-01-01 DIAGNOSIS — E785 Hyperlipidemia, unspecified: Secondary | ICD-10-CM | POA: Insufficient documentation

## 2017-01-01 DIAGNOSIS — E039 Hypothyroidism, unspecified: Secondary | ICD-10-CM | POA: Diagnosis not present

## 2017-01-01 DIAGNOSIS — I2510 Atherosclerotic heart disease of native coronary artery without angina pectoris: Secondary | ICD-10-CM | POA: Diagnosis not present

## 2017-01-01 DIAGNOSIS — R109 Unspecified abdominal pain: Secondary | ICD-10-CM | POA: Diagnosis not present

## 2017-01-01 DIAGNOSIS — Z8619 Personal history of other infectious and parasitic diseases: Secondary | ICD-10-CM | POA: Diagnosis not present

## 2017-01-01 DIAGNOSIS — D559 Anemia due to enzyme disorder, unspecified: Secondary | ICD-10-CM

## 2017-01-01 DIAGNOSIS — K51911 Ulcerative colitis, unspecified with rectal bleeding: Secondary | ICD-10-CM

## 2017-01-01 MED ORDER — SODIUM CHLORIDE 0.9 % IV SOLN: 5.0000 mg/kg | INTRAVENOUS | Status: DC

## 2017-01-01 NOTE — Progress Notes (Signed)
New evaluation for remicade infusions for crohn's. Complains of bloody diarrhea and has had about 4 episodes since this morning. Has tried imodium for his diarrhea without relief in symptoms.

## 2017-01-01 NOTE — Telephone Encounter (Signed)
Patient called and stated that Dr. Maryjane Hurter couldn't find any orders in today for Remicade so they rescheduled him to Tuesday. He is still having bleeding and diarrhea and feels he will end up in the ED again this weekend. Per patient please check to make sure orders are in and call him.

## 2017-01-04 ENCOUNTER — Telehealth: Payer: Self-pay

## 2017-01-04 ENCOUNTER — Other Ambulatory Visit: Payer: Self-pay | Admitting: Oncology

## 2017-01-04 ENCOUNTER — Other Ambulatory Visit: Payer: Self-pay

## 2017-01-04 ENCOUNTER — Telehealth: Payer: Self-pay | Admitting: Gastroenterology

## 2017-01-04 DIAGNOSIS — K51911 Ulcerative colitis, unspecified with rectal bleeding: Secondary | ICD-10-CM

## 2017-01-04 MED ORDER — INFLIXIMAB 100 MG IV SOLR
INTRAVENOUS | 11 refills | Status: DC
Start: 2017-01-04 — End: 2017-06-08

## 2017-01-04 MED ORDER — INFLIXIMAB 100 MG IV SOLR: INTRAVENOUS | 11 refills | Status: DC

## 2017-01-04 NOTE — Telephone Encounter (Signed)
Patient called and his inFLIXimab (REMICADE) 100 MG injection prior Josem Kaufmann has been denied from his insurance company. His appt is tomorrow @ 9am. Please call patient.

## 2017-01-04 NOTE — Telephone Encounter (Signed)
Contacting insurance on patient's behalf for Remicade approval.

## 2017-01-05 ENCOUNTER — Inpatient Hospital Stay: Payer: Medicare Other

## 2017-01-05 ENCOUNTER — Telehealth: Payer: Self-pay | Admitting: Gastroenterology

## 2017-01-05 VITALS — BP 126/65 | HR 62 | Temp 95.1°F | Resp 18

## 2017-01-05 DIAGNOSIS — K51919 Ulcerative colitis, unspecified with unspecified complications: Secondary | ICD-10-CM

## 2017-01-05 MED ORDER — ACETAMINOPHEN 325 MG PO TABS
650.0000 mg | ORAL_TABLET | Freq: Once | ORAL | Status: AC
  Administered 2017-01-05: 650 mg via ORAL
  Filled 2017-01-05: qty 2

## 2017-01-05 MED ORDER — SODIUM CHLORIDE 0.9 % IV SOLN
Freq: Once | INTRAVENOUS | Status: AC
  Administered 2017-01-05: 09:00:00 via INTRAVENOUS
  Filled 2017-01-05: qty 1000

## 2017-01-05 MED ORDER — INFLIXIMAB 100 MG IV SOLR
5.0000 mg/kg | Freq: Once | INTRAVENOUS | Status: AC
  Administered 2017-01-05: 400 mg via INTRAVENOUS
  Filled 2017-01-05: qty 40

## 2017-01-05 NOTE — Telephone Encounter (Signed)
Patient had a inFLIXimab (REMICADE) 5 mg/kg = 400 mg in sodium chloride 0.9 % 250 mL infusion today and he wants to know if he needs to continue taking the prednisone? Please call patient.

## 2017-01-05 NOTE — Telephone Encounter (Signed)
Advised pt per Dr. Vicente Males to continue prednisone x 1 week. Progress assessment after 7 days. If good, decrease prednisone from 28m.

## 2017-01-06 ENCOUNTER — Telehealth: Payer: Self-pay | Admitting: Gastroenterology

## 2017-01-06 NOTE — Telephone Encounter (Signed)
[employer] called to let you know that his Remicade has been approved.

## 2017-01-12 NOTE — Telephone Encounter (Signed)
-----   Message from [place], [place] sent at 01/05/2017  4:40 PM EDT ----- Regarding: Prednisone Decrease Follow-up on Remicade results.  If good, speak with Dr. Vicente Males concerning decreasing prednisone from 66m.

## 2017-01-15 ENCOUNTER — Telehealth: Payer: Self-pay | Admitting: Gastroenterology

## 2017-01-15 NOTE — Telephone Encounter (Signed)
Patient called and is having severe joint pain (0-10 at 8/9). He thinks its a side effect of the inFLIXimab (REMICADE) 5 mg/kg = 400 mg in sodium chloride 0.9 % 250 mL infusion. Please call patient. Next scheduled infusion in Tues 01/19/17 and patient is worried to take another infusion.

## 2017-01-18 ENCOUNTER — Other Ambulatory Visit: Payer: Self-pay

## 2017-01-18 ENCOUNTER — Other Ambulatory Visit
Admission: RE | Admit: 2017-01-18 | Discharge: 2017-01-18 | Disposition: A | Discharge status: Home / Self Care | Payer: Medicare Other | Source: Ambulatory Visit | Attending: Gastroenterology | Admitting: Gastroenterology

## 2017-01-18 DIAGNOSIS — K51819 Other ulcerative colitis with unspecified complications: Secondary | ICD-10-CM | POA: Diagnosis present

## 2017-01-18 DIAGNOSIS — K51811 Other ulcerative colitis with rectal bleeding: Secondary | ICD-10-CM

## 2017-01-18 LAB — C-REACTIVE PROTEIN: CRP: 8 mg/dL — AB (ref <1.0)

## 2017-01-18 MED ORDER — PREDNISONE 5 MG PO TABS: ORAL_TABLET | ORAL | 0 refills | Status: DC

## 2017-01-18 MED ORDER — ACETAMINOPHEN 325 MG PO TABS
650.0000 mg | ORAL_TABLET | Freq: Once | ORAL | Status: AC
  Administered 2017-01-19: 650 mg via ORAL
  Filled 2017-01-18: qty 2

## 2017-01-18 MED ORDER — SODIUM CHLORIDE 0.9 % IV SOLN
Freq: Once | INTRAVENOUS | Status: AC
  Administered 2017-01-19: 09:00:00 via INTRAVENOUS
  Filled 2017-01-18: qty 1000

## 2017-01-18 MED ORDER — SODIUM CHLORIDE 0.9 % IV SOLN
400.0000 mg | Freq: Once | INTRAVENOUS | Status: AC
  Administered 2017-01-19: 400 mg via INTRAVENOUS
  Filled 2017-01-18: qty 40

## 2017-01-18 NOTE — Telephone Encounter (Signed)
David [name]  Once he gets his infusion tomorrow , let him go to prednisone 5 mg every other day and see me while on that

## 2017-01-18 NOTE — Telephone Encounter (Signed)
Advised pt to change prednisone dosage to 73m every other day, per Dr. AVicente Males

## 2017-01-18 NOTE — Telephone Encounter (Signed)
1. Check CRP, ANA 2. Does he have any fevers or rash  3. Which joints are aching  4. Has he taken any tylenol to see if it helps 5. What dose of prednisone is he on  6. How is his diarrhea, cramping and rectal bleeding doing .  7. Inform not many options left - if he does not take remicaid only other option is to try entyvio which will take a few weeks to work and if that is not any option or if does not work will need surgery .  8. Offer him an appointment to see me if he wishes soon or after next infusion

## 2017-01-19 ENCOUNTER — Inpatient Hospital Stay: Payer: Medicare Other | Attending: Oncology

## 2017-01-19 VITALS — BP 112/69 | HR 89 | Temp 97.4°F | Resp 18

## 2017-01-19 DIAGNOSIS — Z79899 Other long term (current) drug therapy: Secondary | ICD-10-CM | POA: Diagnosis not present

## 2017-01-19 DIAGNOSIS — K5190 Ulcerative colitis, unspecified, without complications: Secondary | ICD-10-CM | POA: Insufficient documentation

## 2017-01-19 DIAGNOSIS — K51919 Ulcerative colitis, unspecified with unspecified complications: Secondary | ICD-10-CM

## 2017-01-19 NOTE — Patient Instructions (Signed)
Infliximab injection What is this medicine? INFLIXIMAB (in [place] i mab) is used to treat Crohn's disease and ulcerative colitis. It is also used to treat ankylosing spondylitis, plaque psoriasis, and some forms of arthritis. This medicine may be used for other purposes; ask your health care provider or pharmacist if you have questions. COMMON BRAND NAME(S): INFLECTRA, Remicade, RENFLEXIS What should I tell my health care provider before I take this medicine? They need to know if you have any of these conditions: -cancer -current or past resident of [place] or [place] -diabetes -exposure to tuberculosis -Guillain-Barre syndrome -heart failure -hepatitis or liver disease -immune system problems -infection -lung or breathing disease, like COPD -multiple sclerosis -receiving phototherapy for the skin -seizure disorder -an unusual or allergic reaction to infliximab, mouse proteins, other medicines, foods, dyes, or preservatives -pregnant or trying to get pregnant -breast-feeding How should I use this medicine? This medicine is for injection into a vein. It is usually given by a health care professional in a hospital or clinic setting. A special MedGuide will be given to you by the pharmacist with each prescription and refill. Be sure to read this information carefully each time. Talk to your pediatrician regarding the use of this medicine in children. While this drug may be prescribed for children as young as 64 years of age for selected conditions, precautions do apply. Overdosage: If you think you have taken too much of this medicine contact a poison control center or emergency room at once. NOTE: This medicine is only for you. Do not share this medicine with others. What if I miss a dose? It is important not to miss your dose. Call your doctor or health care professional if you are unable to keep an appointment. What may interact with this medicine? Do not take this  medicine with any of the following medications: -biologic medicines such as abatacept, adalimumab, anakinra, certolizumab, etanercept, golimumab, rituximab, secukinumab, tocilizumab, tofactinib, ustekinumab -live vaccines This list may not describe all possible interactions. Give your health care provider a list of all the medicines, herbs, non-prescription drugs, or dietary supplements you use. Also tell them if you smoke, drink alcohol, or use illegal drugs. Some items may interact with your medicine. What should I watch for while using this medicine? Your condition will be monitored carefully while you are receiving this medicine. Visit your doctor or health care professional for regular checks on your progress. You may need blood work done while you are taking this medicine. Before beginning therapy, your doctor may do a test to see if you have been exposed to tuberculosis. Call your doctor or health care professional for advice if you get a fever, chills or sore throat, or other symptoms of a cold or flu. Do not treat yourself. This drug decreases your body's ability to fight infections. Try to avoid being around people who are sick. This medicine may make the symptoms of heart failure worse in some patients. If you notice symptoms such as increased shortness of breath or swelling of the ankles or legs, contact your health care provider right away. If you are going to have surgery or dental work, tell your health care professional or dentist that you have received this medicine. If you take this medicine for plaque psoriasis, stay out of the sun. If you cannot avoid being in the sun, wear protective clothing and use sunscreen. Do not use sun lamps or tanning beds/booths. Talk to your doctor about your risk of cancer. You may be  more at risk for certain types of cancers if you take this medicine. What side effects may I notice from receiving this medicine? Side effects that you should report to your  doctor or health care professional as soon as possible: -allergic reactions like skin rash, itching or hives, swelling of the face, lips, or tongue -breathing problems -changes in vision -chest pain -fever or chills, usually related to the infusion -joint pain -pain, tingling, numbness in the hands or feet -redness, blistering, peeling or loosening of the skin, including inside the mouth -seizures -signs of infection - fever or chills, cough, sore throat, flu-like symptoms, pain or difficulty passing urine -signs and symptoms of liver injury like dark yellow or brown urine; general ill feeling; light-colored stools; loss of appetite; nausea; right upper belly pain; unusually weak or tired; yellowing of the eyes or skin -signs and symptoms of a stroke like changes in vision; confusion; trouble speaking or understanding; severe headaches; sudden numbness or weakness of the face, arm or leg; trouble walking; dizziness; loss of balance or coordination -swelling of the ankles, feet, or hands -swollen lymph nodes in the neck, underarm, or groin areas -unusual bleeding or bruising -unusually weak or tired Side effects that usually do not require medical attention (report to your doctor or health care professional if they continue or are bothersome): -headache -nausea -stomach pain -upset stomach This list may not describe all possible side effects. Call your doctor for medical advice about side effects. You may report side effects to FDA at 1-800-FDA-1088. Where should I keep my medicine? This drug is given in a hospital or clinic and will not be stored at home. NOTE: This sheet is a summary. It may not cover all possible information. If you have questions about this medicine, talk to your doctor, pharmacist, or health care provider.  2018 Elsevier/Gold Standard (2016-10-28 13:45:32)

## 2017-01-20 ENCOUNTER — Other Ambulatory Visit: Payer: Self-pay

## 2017-01-20 ENCOUNTER — Encounter: Payer: Self-pay | Admitting: Gastroenterology

## 2017-01-20 ENCOUNTER — Ambulatory Visit (INDEPENDENT_AMBULATORY_CARE_PROVIDER_SITE_OTHER): Payer: Medicare Other | Admitting: Gastroenterology

## 2017-01-20 VITALS — BP 101/58 | HR 80 | Temp 98.2°F | Ht 72.0 in | Wt 187.4 lb

## 2017-01-20 DIAGNOSIS — K51011 Ulcerative (chronic) pancolitis with rectal bleeding: Secondary | ICD-10-CM

## 2017-01-20 DIAGNOSIS — K51911 Ulcerative colitis, unspecified with rectal bleeding: Secondary | ICD-10-CM

## 2017-01-20 NOTE — Progress Notes (Signed)
Primary Care Physician: David Brunner, MD  Primary Gastroenterologist:  Dr. Jonathon [name]   Chief Complaint  Patient presents with  . Medication follow up    Remicade infusion    HPI: David [name] is a 71 y.o. male   Summary of history :  Hehas had ulcerative colitis from 2007 . 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 Remicaid d/c in 2009 due to the malignant skin lesions. Since 2009 Not been on any medications. He was being followed by [facility] till 2015 when his doctor retired. Hospitalized in 2011 for flare of the colitis. His symptoms returned in early December . I performed a sigmoidoscopy on 09/25/16 and I noted moderate to severe left sided colitis. Biopsies Confirmed marked active colitis negative for HSV and CMV,Commenced him on mesalamine which he did not tolerate and was admitted to the hospital with symptoms. 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 From the colitis point of view he was stable. Restarted on prednisone after a few days of antibiotics. Likely he had the clot secondary to colitis and subsequently the clot may have been infected. We wanted to transfer him to a tertiary center but he refused . He was seen at [place] Dr David [name] who specializes in IBD for a second  opinion as he has had prior skin cancer with regards to options. She saw him on 12/01/16   Interval history 12/03/16-01/2017  CRP 8 elevated.   Commenced on remicaid 01/05/17- 2 weeks after infusion and had mody aches and joint pains, tylenol did help it. Had the second dose yesterday , was feeling weak yesterday and today no headache but feels weak. 3 days after the first infusion had no blood or diarrhea and firming up of stools. Yesterday had some diarrhea but no blood.  He is presently on prednisone 5 mg every other day .    Current Outpatient Prescriptions  Medication Sig Dispense Refill  . ACCU-CHEK AVIVA PLUS test strip 1 each by Other route as needed for other.     Marland Kitchen aspirin EC 81 MG tablet Take 81 mg by mouth 2 (two) times daily.     Marland Kitchen atorvastatin (LIPITOR) 40 MG tablet Take 40 mg by mouth daily.     . carvedilol (COREG) 3.125 MG tablet Take 3.125 mg by mouth 2 (two) times daily with a meal.     . Cholecalciferol (VITAMIN D) 2000 units CAPS Take 1 capsule by mouth 2 (two) times daily.     Marland Kitchen glipiZIDE (GLUCOTROL) 10 MG tablet Take 10 mg by mouth 2 (two) times daily.     Marland Kitchen inFLIXimab (REMICADE) 100 MG injection 60m/kg starting week 0, 2, 6. Then every 8 weeks thereafter 1 each 11  . levothyroxine (SYNTHROID, LEVOTHROID) 50 MCG tablet Take 50 mcg by mouth daily before breakfast.     .  Multiple Vitamins-Minerals (MULTIVITAMIN ADULT PO) Take 1 tablet by mouth daily.     . Omega-3 Fatty Acids (FISH OIL PO) Take 1 capsule by mouth 2 (two) times daily.     Marland Kitchen omeprazole (PRILOSEC) 20 MG capsule Take 20 mg by mouth daily.     . predniSONE (DELTASONE) 5 MG tablet 80m by mouth every other day 100 tablet 0  . Probiotic Product (PROBIOTIC DAILY PO) Take 1 capsule by mouth 2 (two) times daily.    . ramipril (ALTACE) 5 MG capsule Take 5 mg by mouth daily.     .Marland Kitchenwarfarin (COUMADIN) 5 MG tablet TAKE 1 TABLET (5 MG TOTAL) BY MOUTH ONCE DAILY.  5  . Warfarin Sodium (COUMADIN PO) Take by mouth  daily. Coumadin Schedule is Alternated  Daily  @@ 6 pm 2 mg one day Following day 4 mg then 2 mg next day 4 mg alternate daily     Current Facility-Administered Medications  Medication Dose Route Frequency Provider Last Rate Last Dose  . inFLIXimab (REMICADE) 5 mg/kg = 400 mg in sodium chloride 0.9 % 250 mL infusion  5 mg/kg (Order-Specific) Intravenous Q21 days David Bellows MD        Allergies as of 01/20/2017 - Review Complete 01/20/2017  Allergen Reaction Noted  . Codeine Nausea And Vomiting and Other (See Comments)   . Mesalamine Nausea Only and Nausea And Vomiting 12/01/2016    ROS:  General: Negative for anorexia, weight loss, fever, chills, fatigue, weakness. ENT: Negative for hoarseness, difficulty swallowing , nasal congestion. CV: Negative for chest pain, angina, palpitations, dyspnea on exertion, peripheral edema.  Respiratory: Negative for dyspnea at rest, dyspnea on exertion, cough, sputum, wheezing.  GI: See history of present illness. GU:  Negative for dysuria, hematuria, urinary incontinence, urinary frequency, nocturnal urination.  Endo: Negative for unusual weight change.    Physical Examination:   BP (!) 101/58   Pulse 80   Temp 98.2 F (36.8 C) (Oral)   Ht 6' (1.829 m)   Wt 187 lb 6.4 oz (85 kg)   BMI 25.42 kg/m   General: Well-nourished, well-developed in no acute distress.  Eyes: No icterus. Conjunctivae pink. Mouth: Oropharyngeal mucosa moist and pink , no lesions erythema or exudate. Lungs: Clear to auscultation bilaterally. Non-labored. Heart: Regular rate and rhythm, no murmurs rubs or gallops.  Abdomen: Bowel sounds are normal, nontender, nondistended, no hepatosplenomegaly or masses, no abdominal bruits or hernia , no rebound or guarding.   Extremities: No lower extremity edema. No clubbing or deformities. Neuro: Alert and oriented x 3.  Grossly intact. Skin: Warm and dry, no jaundice.   Psych: Alert and cooperative, normal mood and  affect.   Imaging Studies: No results found.  Assessment and Plan:   David [name] a 71y.o. y/o male with a history of long standing ulcerative colitis- off treatment for many years and recent onset of symptoms while off all therapy . Due to prior history of skin cancer (melanoma and he recalls basal cell ca) in the past ,options of treatment are more limiteddue to increased risk of skin cancers associated with Anti TNF and relatively less data available on the newer agents such as entyvio, options such as Imuran too increase risk of non melanomatous skin cancer. 3 hospital admission over the past few monthsfor his colitis.On anticoagulation for the SMV clot. [facility] and we discussed his case after. Options  were colectomy which he was not keen, use of Vedolizumab which has least possibility of skin cancer risks but takes longer to act  and infliximab which works faster but has higher risk of skin cancer. Based on the options available. He  chose to commence on infliximab, he is aware of possible infusion reactions, risk of immunosupression , patient information provided . Commenced on infliximab on 01/05/17   Plan  1.  Hep A serology and  Vitamin D levels with labs  2. Infliximab due for second dose, CRP is still elevated but no symptoms clinically  3. Levels to be checked at week 14 to determine dose escalation if needed on July 3rd 2018 before next infusion , if levels are in therapeutic range then will recheck crp and fecal lactoferrin followed by endoscopic evaluation to determine if he is in remission 4. Yearly skin exam  7 .He will decrease prednisone to 5 mg every 3 days from next  and then stop after a week   Dr David Bellows  MD Follow up in 2-3 weeks

## 2017-01-21 ENCOUNTER — Ambulatory Visit: Payer: Medicare Other | Admitting: Gastroenterology

## 2017-02-15 MED ORDER — ACETAMINOPHEN 325 MG PO TABS
650.0000 mg | ORAL_TABLET | Freq: Once | ORAL | Status: AC
  Administered 2017-02-16: 650 mg via ORAL
  Filled 2017-02-15: qty 2

## 2017-02-15 MED ORDER — SODIUM CHLORIDE 0.9 % IV SOLN
400.0000 mg | Freq: Once | INTRAVENOUS | Status: AC
  Administered 2017-02-16: 400 mg via INTRAVENOUS
  Filled 2017-02-15: qty 40

## 2017-02-16 ENCOUNTER — Inpatient Hospital Stay: Payer: Medicare Other | Attending: Internal Medicine

## 2017-02-16 VITALS — BP 138/75 | HR 73 | Temp 96.2°F | Resp 18

## 2017-02-16 DIAGNOSIS — Z79899 Other long term (current) drug therapy: Secondary | ICD-10-CM | POA: Diagnosis not present

## 2017-02-16 DIAGNOSIS — K51919 Ulcerative colitis, unspecified with unspecified complications: Secondary | ICD-10-CM

## 2017-02-16 DIAGNOSIS — K5190 Ulcerative colitis, unspecified, without complications: Secondary | ICD-10-CM | POA: Insufficient documentation

## 2017-02-16 MED ORDER — SODIUM CHLORIDE 0.9 % IV SOLN
Freq: Once | INTRAVENOUS | Status: AC
  Administered 2017-02-16: 09:00:00 via INTRAVENOUS
  Filled 2017-02-16: qty 1000

## 2017-02-16 NOTE — Patient Instructions (Signed)
Infliximab injection What is this medicine? INFLIXIMAB (in Lucien i mab) is used to treat Crohn's disease and ulcerative colitis. It is also used to treat ankylosing spondylitis, plaque psoriasis, and some forms of arthritis. This medicine may be used for other purposes; ask your health care provider or pharmacist if you have questions. COMMON BRAND NAME(S): INFLECTRA, Remicade, RENFLEXIS What should I tell my health care provider before I take this medicine? They need to know if you have any of these conditions: -cancer -current or past resident of [place] or [place] -diabetes -exposure to tuberculosis -Guillain-Barre syndrome -heart failure -hepatitis or liver disease -immune system problems -infection -lung or breathing disease, like COPD -multiple sclerosis -receiving phototherapy for the skin -seizure disorder -an unusual or allergic reaction to infliximab, mouse proteins, other medicines, foods, dyes, or preservatives -pregnant or trying to get pregnant -breast-feeding How should I use this medicine? This medicine is for injection into a vein. It is usually given by a health care professional in a hospital or clinic setting. A special MedGuide will be given to you by the pharmacist with each prescription and refill. Be sure to read this information carefully each time. Talk to your pediatrician regarding the use of this medicine in children. While this drug may be prescribed for children as young as 38 years of age for selected conditions, precautions do apply. Overdosage: If you think you have taken too much of this medicine contact a poison control center or emergency room at once. NOTE: This medicine is only for you. Do not share this medicine with others. What if I miss a dose? It is important not to miss your dose. Call your doctor or health care professional if you are unable to keep an appointment. What may interact with this medicine? Do not take this  medicine with any of the following medications: -biologic medicines such as abatacept, adalimumab, anakinra, certolizumab, etanercept, golimumab, rituximab, secukinumab, tocilizumab, tofactinib, ustekinumab -live vaccines This list may not describe all possible interactions. Give your health care provider a list of all the medicines, herbs, non-prescription drugs, or dietary supplements you use. Also tell them if you smoke, drink alcohol, or use illegal drugs. Some items may interact with your medicine. What should I watch for while using this medicine? Your condition will be monitored carefully while you are receiving this medicine. Visit your doctor or health care professional for regular checks on your progress. You may need blood work done while you are taking this medicine. Before beginning therapy, your doctor may do a test to see if you have been exposed to tuberculosis. Call your doctor or health care professional for advice if you get a fever, chills or sore throat, or other symptoms of a cold or flu. Do not treat yourself. This drug decreases your body's ability to fight infections. Try to avoid being around people who are sick. This medicine may make the symptoms of heart failure worse in some patients. If you notice symptoms such as increased shortness of breath or swelling of the ankles or legs, contact your health care provider right away. If you are going to have surgery or dental work, tell your health care professional or dentist that you have received this medicine. If you take this medicine for plaque psoriasis, stay out of the sun. If you cannot avoid being in the sun, wear protective clothing and use sunscreen. Do not use sun lamps or tanning beds/booths. Talk to your doctor about your risk of cancer. You may be  more at risk for certain types of cancers if you take this medicine. What side effects may I notice from receiving this medicine? Side effects that you should report to your  doctor or health care professional as soon as possible: -allergic reactions like skin rash, itching or hives, swelling of the face, lips, or tongue -breathing problems -changes in vision -chest pain -fever or chills, usually related to the infusion -joint pain -pain, tingling, numbness in the hands or feet -redness, blistering, peeling or loosening of the skin, including inside the mouth -seizures -signs of infection - fever or chills, cough, sore throat, flu-like symptoms, pain or difficulty passing urine -signs and symptoms of liver injury like dark yellow or brown urine; general ill feeling; light-colored stools; loss of appetite; nausea; right upper belly pain; unusually weak or tired; yellowing of the eyes or skin -signs and symptoms of a stroke like changes in vision; confusion; trouble speaking or understanding; severe headaches; sudden numbness or weakness of the face, arm or leg; trouble walking; dizziness; loss of balance or coordination -swelling of the ankles, feet, or hands -swollen lymph nodes in the neck, underarm, or groin areas -unusual bleeding or bruising -unusually weak or tired Side effects that usually do not require medical attention (report to your doctor or health care professional if they continue or are bothersome): -headache -nausea -stomach pain -upset stomach This list may not describe all possible side effects. Call your doctor for medical advice about side effects. You may report side effects to FDA at 1-800-FDA-1088. Where should I keep my medicine? This drug is given in a hospital or clinic and will not be stored at home. NOTE: This sheet is a summary. It may not cover all possible information. If you have questions about this medicine, talk to your doctor, pharmacist, or health care provider.  2018 Elsevier/Gold Standard (2016-10-28 13:45:32)

## 2017-02-24 ENCOUNTER — Ambulatory Visit (INDEPENDENT_AMBULATORY_CARE_PROVIDER_SITE_OTHER): Payer: Medicare Other | Admitting: Gastroenterology

## 2017-02-24 ENCOUNTER — Encounter: Payer: Self-pay | Admitting: Gastroenterology

## 2017-02-24 VITALS — BP 114/70 | HR 74 | Temp 98.0°F | Ht 72.0 in | Wt 188.2 lb

## 2017-02-24 DIAGNOSIS — K5100 Ulcerative (chronic) pancolitis without complications: Secondary | ICD-10-CM | POA: Diagnosis not present

## 2017-02-24 DIAGNOSIS — R5383 Other fatigue: Secondary | ICD-10-CM

## 2017-02-24 NOTE — Progress Notes (Signed)
Primary Care Physician: Madelyn Brunner, MD  Primary Gastroenterologist:  Dr. [name]   Chief Complaint  Patient presents with  . Ulcerative Colitis    follow up    HPI: David [name] is a 71 y.o. male  Summary of history :  Hehas had ulcerative colitis from 2007 . 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 Remicaid d/c in 2009 due to the malignant skin lesions. Since 2009 Not been on any medications. He was being followed by [facility] till 2015 when his doctor retired. Hospitalized in 2011 for flare of the colitis. His symptoms returned in early December when I took over his care . I performed a sigmoidoscopy on 09/25/16 and I noted moderate to severe left sided colitis. Biopsies Confirmed marked active colitis negative for HSV and CMV,Commenced him on mesalamine which he did not tolerate and was admitted to the hospital with symptoms. 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  Restarted on prednisone after a few days of antibiotics. Likely he had the clot secondary to colitis and subsequently the clot may have been infected. We wanted to transfer him to a tertiary center but he refused . He was seen at [place] Dr Daphane Shepherd who specializes in IBD for a second opinion as he has had prior skin cancer  with regards to options. She saw him on 12/01/16.Commenced on remicaid 01/05/17. 3 days after the first infusion had no blood or diarrhea and firming up of stools.   Interval history 01/2017-02/2017  Hb 12.3 on 02/22/17 INR 2.4   Last visit ordered Hep A serology /vitamin D which he didn't obtain  He stopped his prednisone last week. He had a dose of remicaid on may 8th and said he was "knocked out flat ", had joint pains, felt exhausted . This time he was also sick and says "unable to recover since", no blood in stools, "almost normal stools" 1 bowel movement a day , feels very tired and weak.    Current Outpatient Prescriptions  Medication Sig Dispense Refill  . ACCU-CHEK AVIVA PLUS test strip 1 each by Other route as needed for other.     Marland Kitchen aspirin EC 81 MG tablet Take 81 mg by mouth 2 (two) times daily.     Marland Kitchen atorvastatin (LIPITOR) 40 MG tablet Take 40 mg by mouth daily.     . carvedilol (COREG) 3.125 MG tablet Take 3.125 mg by mouth 2 (two) times daily with a meal.     . Cholecalciferol (VITAMIN D) 2000 units CAPS Take 1 capsule by mouth 2 (two) times daily.     Marland Kitchen glipiZIDE (GLUCOTROL) 10 MG tablet Take 10 mg by mouth 2 (two) times daily.     Marland Kitchen inFLIXimab (REMICADE) 100 MG injection 47m/kg starting week 0, 2, 6. Then every 8 weeks thereafter 1  each 11  . levothyroxine (SYNTHROID, LEVOTHROID) 50 MCG tablet Take 50 mcg by mouth daily before breakfast.     . Multiple Vitamins-Minerals (MULTIVITAMIN ADULT PO) Take 1 tablet by mouth daily.     . Omega-3 Fatty Acids (FISH OIL PO) Take 1 capsule by mouth 2 (two) times daily.     Marland Kitchen omeprazole (PRILOSEC) 20 MG capsule Take 20 mg by mouth daily.     . predniSONE (DELTASONE) 5 MG tablet 63m by mouth every other day 100 tablet 0  . ramipril (ALTACE) 5 MG capsule Take 5 mg by mouth daily.     .Marland Kitchenwarfarin (COUMADIN) 5 MG tablet TAKE 1 TABLET (5 MG TOTAL) BY MOUTH ONCE DAILY.  5  . Probiotic Product (PROBIOTIC DAILY PO) Take 1 capsule by mouth 2 (two)  times daily.    . Warfarin Sodium (COUMADIN PO) Take by mouth daily. Coumadin Schedule is Alternated  Daily  @@ 6 pm 2 mg one day Following day 4 mg then 2 mg next day 4 mg alternate daily     Current Facility-Administered Medications  Medication Dose Route Frequency Provider Last Rate Last Dose  . inFLIXimab (REMICADE) 5 mg/kg = 400 mg in sodium chloride 0.9 % 250 mL infusion  5 mg/kg (Order-Specific) Intravenous Q21 days AJonathon Bellows MD        Allergies as of 02/24/2017 - Review Complete 02/24/2017  Allergen Reaction Noted  . Codeine Nausea And Vomiting and Other (See Comments)   . Mesalamine Nausea Only and Nausea And Vomiting 12/01/2016    ROS:  General: Negative for anorexia, weight loss, fever, chills, fatigue, weakness. ENT: Negative for hoarseness, difficulty swallowing , nasal congestion. CV: Negative for chest pain, angina, palpitations, dyspnea on exertion, peripheral edema.  Respiratory: Negative for dyspnea at rest, dyspnea on exertion, cough, sputum, wheezing.  GI: See history of present illness. GU:  Negative for dysuria, hematuria, urinary incontinence, urinary frequency, nocturnal urination.  Endo: Negative for unusual weight change.    Physical Examination:   BP 114/70   Pulse 74   Temp 98 F (36.7 C) (Oral)   Ht 6' (1.829 m)   Wt 188 lb 3.2 oz (85.4 kg)   BMI 25.52 kg/m   General: Well-nourished, well-developed in no acute distress.  Eyes: No icterus. Conjunctivae pink. Mouth: Oropharyngeal mucosa moist and pink , no lesions erythema or exudate. Lungs: Clear to auscultation bilaterally. Non-labored. Heart: Regular rate and rhythm, no murmurs rubs or gallops.  Abdomen: Bowel sounds are normal, nontender, nondistended, no hepatosplenomegaly or masses, no abdominal bruits or hernia , no rebound or guarding.   Extremities: No lower extremity edema. No clubbing or deformities. Neuro: Alert and oriented x 3.  Grossly intact. Skin: Warm and dry, no jaundice.     Psych: Alert and cooperative, normal mood and affect.    Imaging Studies: No results found.  Assessment and Plan:   CTAUREN DELBUONOis a 71y.o. 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 [facility] and we discussed his case after. Options were colectomy which he was not keen, use of Vedolizumab which has least possibility of skin cancer risks but takes longer to  act and infliximab which works faster but has higher risk of skin cancer. Based on the options available. Commenced on infliximab on 01/05/17 .clinically he has no symotoms but has fatigue and lower quality of life at this time.    Plan  1. Hep A serology and Vitamin D levels with labs  2. Infliximab due for second dose, CRP is still elevated but no symptoms clinically  3. Levels to be checked at week 14 to determine dose escalation if needed on July 3rd 2018 before next infusion , if levels are in therapeutic range then will recheck crp and fecal lactoferrin followed by endoscopic evaluation to determine if he is in remission 4. Yearly skin exam    Dr [name]  MD Follow up in mid July

## 2017-03-25 ENCOUNTER — Other Ambulatory Visit: Payer: Self-pay

## 2017-03-25 DIAGNOSIS — R5383 Other fatigue: Secondary | ICD-10-CM

## 2017-03-25 DIAGNOSIS — K5190 Ulcerative colitis, unspecified, without complications: Secondary | ICD-10-CM

## 2017-04-01 ENCOUNTER — Other Ambulatory Visit
Admission: RE | Admit: 2017-04-01 | Discharge: 2017-04-01 | Disposition: A | Discharge status: Home / Self Care | Payer: Medicare Other | Source: Ambulatory Visit | Attending: Gastroenterology | Admitting: Gastroenterology

## 2017-04-01 DIAGNOSIS — K5190 Ulcerative colitis, unspecified, without complications: Secondary | ICD-10-CM | POA: Diagnosis present

## 2017-04-01 DIAGNOSIS — R5383 Other fatigue: Secondary | ICD-10-CM | POA: Diagnosis present

## 2017-04-01 LAB — COMPREHENSIVE METABOLIC PANEL
ALT: 17 U/L (ref 17–63)
ANION GAP: 6 (ref 5–15)
AST: 25 U/L (ref 15–41)
Albumin: 3.9 g/dL (ref 3.5–5.0)
Alkaline Phosphatase: 50 U/L (ref 38–126)
BUN: 13 mg/dL (ref 6–20)
CHLORIDE: 105 mmol/L (ref 101–111)
CO2: 26 mmol/L (ref 22–32)
CREATININE: 0.93 mg/dL (ref 0.61–1.24)
Calcium: 8.9 mg/dL (ref 8.9–10.3)
Glucose, Bld: 92 mg/dL (ref 65–99)
Potassium: 3.5 mmol/L (ref 3.5–5.1)
SODIUM: 137 mmol/L (ref 135–145)
Total Bilirubin: 1.3 mg/dL — ABNORMAL HIGH (ref 0.3–1.2)
Total Protein: 7.2 g/dL (ref 6.5–8.1)

## 2017-04-01 LAB — IRON AND TIBC
IRON: 104 ug/dL (ref 45–182)
SATURATION RATIOS: 29 % (ref 17.9–39.5)
TIBC: 356 ug/dL (ref 250–450)
UIBC: 252 ug/dL

## 2017-04-01 LAB — FERRITIN: FERRITIN: 20 ng/mL — AB (ref 24–336)

## 2017-04-01 LAB — C-REACTIVE PROTEIN: CRP: 0.9 mg/dL (ref <1.0)

## 2017-04-02 LAB — HEPATITIS A ANTIBODY, TOTAL: Hep A Total Ab: NEGATIVE

## 2017-04-04 LAB — VITAMIN D 1,25 DIHYDROXY
VITAMIN D 1, 25 (OH) TOTAL: 33 pg/mL
VITAMIN D3 1, 25 (OH): 33 pg/mL
Vitamin D2 1, 25 (OH)2: <10 pg/mL

## 2017-04-06 ENCOUNTER — Encounter: Payer: Self-pay | Admitting: Gastroenterology

## 2017-04-06 ENCOUNTER — Ambulatory Visit (INDEPENDENT_AMBULATORY_CARE_PROVIDER_SITE_OTHER): Payer: Medicare Other | Admitting: Gastroenterology

## 2017-04-06 VITALS — BP 123/67 | HR 72 | Temp 98.1°F | Ht 72.0 in | Wt 190.8 lb

## 2017-04-06 DIAGNOSIS — K5100 Ulcerative (chronic) pancolitis without complications: Secondary | ICD-10-CM | POA: Diagnosis not present

## 2017-04-06 NOTE — Progress Notes (Signed)
[name] MD, MRCP(U.K) [address]  [place]  [place], [place] [address]  Main: [phone]  Fax: [phone]   Primary Care Physician: Madelyn Brunner, MD  Primary Gastroenterologist:  Dr. [name]   Chief Complaint  Patient presents with  . Follow-up  . Fatigue  . Gait Problem    HPI: David [name] is a 71 y.o. male    Hehas had ulcerative colitis from 2007 . 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 Remicaid d/c in 2009 due to the malignant skin lesions. Since 2009 Not been on any medications. He was being followed by [facility] till 2015 when his doctor retired. Hospitalized in 2011 for flare of the colitis. His symptoms returned in early December when I took over his care . I performed a sigmoidoscopy on 09/25/16 and I noted moderate to severe left sided colitis. Biopsies Confirmed marked active colitis negative for HSV and CMV,Commenced him on mesalamine which he did not tolerate and was admitted to the hospital with symptoms. 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  Restarted on prednisone after a few days of antibiotics. Likely he had the clot secondary to colitis and subsequently the clot may have been infected. We wanted to transfer him to a tertiary center but he  refused . He was seen at [place] Dr Daphane Shepherd who specializes in IBD for a second opinion as he has had prior skin cancer with regards to options. She saw him on 12/01/16.Commenced on remicaid 01/05/17. 3 days after the first infusion had no blood or diarrhea and firming up of stools.   Interval history 02/2017 -03/2017  Labs 04/01/17- CRP 0.9, normal vitamin D. Not immune to hep A. CMP normal.   Says after last dose of remicaid has been feeling very tired, exhausted, weak , unable to stand up, hurting in his hands , some numbness after the infusion in his left arm  . Also had some night sweats at that time. Just has 1 or no bowel movements a day. No blood in his stool. Marland Kitchen He cant recall when his last colonoscopy was. He  Feels he cant go on with the infliximab as it wears him down and exhausts him to the point that he cant function. Current Outpatient Prescriptions  Medication Sig Dispense Refill  . ACCU-CHEK AVIVA PLUS test strip 1 each by Other route as needed for other.     Marland Kitchen aspirin EC 81 MG tablet Take 81 mg by mouth 2 (two) times daily.     Marland Kitchen atorvastatin (LIPITOR) 40 MG tablet Take 40 mg by mouth daily.     . carvedilol (COREG) 3.125 MG tablet Take 3.125 mg by mouth 2 (two) times daily with a meal.     . Cholecalciferol (VITAMIN D) 2000 units CAPS Take 1 capsule by mouth  2 (two) times daily.     Marland Kitchen glipiZIDE (GLUCOTROL) 10 MG tablet Take 10 mg by mouth 2 (two) times daily.     Marland Kitchen inFLIXimab (REMICADE) 100 MG injection 4m/kg starting week 0, 2, 6. Then every 8 weeks thereafter 1 each 11  . inFLIXimab (REMICADE) 100 MG injection 560mkg starting week 0, 2, 6. Then every 8 weeks thereafter    . levothyroxine (SYNTHROID, LEVOTHROID) 50 MCG tablet Take 50 mcg by mouth daily before breakfast.     . Multiple Vitamins-Minerals (MULTIVITAMIN ADULT PO) Take 1 tablet by mouth daily.     . Omega-3 Fatty Acids (FISH OIL PO) Take 1 capsule by mouth 2 (two) times daily.     . Marland Kitchenmeprazole (PRILOSEC) 20 MG capsule  Take 20 mg by mouth daily.     . predniSONE (DELTASONE) 5 MG tablet 19m22my mouth every other day 100 tablet 0  . Probiotic Product (PROBIOTIC DAILY PO) Take 1 capsule by mouth 2 (two) times daily.    . ramipril (ALTACE) 5 MG capsule Take 5 mg by mouth daily.     . wMarland Kitchenrfarin (COUMADIN) 5 MG tablet TAKE 1 TABLET (5 MG TOTAL) BY MOUTH ONCE DAILY.  5  . Warfarin Sodium (COUMADIN PO) Take by mouth daily. Coumadin Schedule is Alternated  Daily  @@ 6 pm 2 mg one day Following day 4 mg then 2 mg next day 4 mg alternate daily     Current Facility-Administered Medications  Medication Dose Route Frequency Provider Last Rate Last Dose  . inFLIXimab (REMICADE) 5 mg/kg = 400 mg in sodium chloride 0.9 % 250 mL infusion  5 mg/kg (Order-Specific) Intravenous Q21 days AnnJonathon BellowsD        Allergies as of 04/06/2017 - Review Complete 04/06/2017  Allergen Reaction Noted  . Codeine Nausea And Vomiting and Other (See Comments)   . Mesalamine Nausea Only and Nausea And Vomiting 12/01/2016    ROS:  General: Negative for anorexia, weight loss, fever, chills, fatigue, weakness. ENT: Negative for hoarseness, difficulty swallowing , nasal congestion. CV: Negative for chest pain, angina, palpitations, dyspnea on exertion, peripheral edema.  Respiratory: Negative for dyspnea at rest, dyspnea on exertion, cough, sputum, wheezing.  GI: See history of present illness. GU:  Negative for dysuria, hematuria, urinary incontinence, urinary frequency, nocturnal urination.  Endo: Negative for unusual weight change.    Physical Examination:   BP 123/67 (BP Location: Left Arm, Patient Position: Sitting, Cuff Size: Normal)   Pulse 72   Temp 98.1 F (36.7 C) (Oral)   Ht 6' (1.829 m)   Wt 190 lb 12.8 oz (86.5 kg)   BMI 25.88 kg/m   General: Well-nourished, well-developed in no acute distress.  Eyes: No icterus. Conjunctivae pink. Mouth: Oropharyngeal mucosa moist and pink , no lesions erythema or exudate. Lungs: Clear  to auscultation bilaterally. Non-labored. Heart: Regular rate and rhythm, no murmurs rubs or gallops.  Abdomen: Bowel sounds are normal, nontender, nondistended, no hepatosplenomegaly or masses, no abdominal bruits or hernia , no rebound or guarding.   Extremities: No lower extremity edema. No clubbing or deformities. Neuro: Alert and oriented x 3.  Grossly intact. Skin: Warm and dry, no jaundice.   Psych: Alert and cooperative, normal mood and affect.   Imaging Studies: No results found.  Assessment and Plan:   David [name] a [age]. y/o male a history of long standing ulcerative colitis- off treatment for many years and recent onset of symptoms while off all therapy .  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 He was seen by Dr Daphane Shepherd at [facility] who specializes in IBD for a second opinion and we discussed his case after. Options werecolectomy which he was notkeen, use of Vedolizumab which has least possibility of skin cancer risks but takes longer to act and infliximab which works faster but has higher risk of skin cancer. Based on the options available. Commenced on infliximab on 01/05/17 .clinically he has no symotoms but has fatigue and lower quality of life at this time which he attributes to the infliximab.Biochemically and clinically his colitis seems to be in remission .    Plan  1.He would like to switch to [facility] if the costs permit due to a lot of side effects such as fatigue, weakness which am not sure if related to the drug or extra intestinal manifestation of her ulcerative colitis.I will refer to a Rheumatologist in the meanwhile  2. I did discuss about the need for a colonoscopy to r/o any neoplasm as he has really never had one. He will think about it.   All prior evaluations have been sigmoidoscopes as he always had active colitis   Dr [name]  MD,MRCP  [facility]) Follow up in 4 weeks.

## 2017-04-12 ENCOUNTER — Telehealth: Payer: Self-pay | Admitting: Gastroenterology

## 2017-04-12 NOTE — Telephone Encounter (Signed)
Please call patient regarding his infusions.

## 2017-04-13 ENCOUNTER — Inpatient Hospital Stay: Payer: Medicare Other | Attending: Oncology

## 2017-04-13 VITALS — BP 128/71 | HR 65 | Temp 95.0°F | Resp 18

## 2017-04-13 DIAGNOSIS — Z79899 Other long term (current) drug therapy: Secondary | ICD-10-CM | POA: Insufficient documentation

## 2017-04-13 DIAGNOSIS — K51919 Ulcerative colitis, unspecified with unspecified complications: Secondary | ICD-10-CM

## 2017-04-13 DIAGNOSIS — K5190 Ulcerative colitis, unspecified, without complications: Secondary | ICD-10-CM | POA: Insufficient documentation

## 2017-04-13 MED ORDER — SODIUM CHLORIDE 0.9 % IV SOLN
Freq: Once | INTRAVENOUS | Status: AC
  Administered 2017-04-13: 09:00:00 via INTRAVENOUS
  Filled 2017-04-13: qty 1000

## 2017-04-13 MED ORDER — SODIUM CHLORIDE 0.9 % IV SOLN
5.0000 mg/kg | Freq: Once | INTRAVENOUS | Status: AC
  Administered 2017-04-13: 400 mg via INTRAVENOUS
  Filled 2017-04-13: qty 40

## 2017-04-13 MED ORDER — ACETAMINOPHEN 325 MG PO TABS
650.0000 mg | ORAL_TABLET | Freq: Once | ORAL | Status: AC
  Administered 2017-04-13: 650 mg via ORAL
  Filled 2017-04-13: qty 2

## 2017-04-16 ENCOUNTER — Telehealth: Payer: Self-pay

## 2017-04-16 NOTE — Telephone Encounter (Signed)
Called David [name] to check on status. Per spouse hurting all over. Back pain started during infusion. Had a headache, chills, sweats, and fever. Left hand numbness, swollen...infusion took place in the right hand.   Advised patient I would pass the information along to Dr. Vicente Males.   Waiting for [facility] patient assistance.

## 2017-04-20 ENCOUNTER — Telehealth: Payer: Self-pay

## 2017-04-20 NOTE — Telephone Encounter (Signed)
Inquired about patient's health..the patient states he's getting his strength back. He's able to walk. No fever. Still has numbness in left hand and some lower back pain.   Advised patient to contact PCP concerning hand and back pain.  David [name] needs proof of insurance. Patient to bring SS# income verification to the office tomorrow.

## 2017-04-20 NOTE — Telephone Encounter (Signed)
-----   Message from [name], MD sent at 04/16/2017 10:46 AM EDT ----- Regarding: RE: Remicade Infusion Side-Effects Tylenol 325 mg TID, if no better or worse to get back in touch with on call GI or call office on Monday   Check temperature to confirm having a fever and if persists may need further evaluation. Lets plan to stop further remicaid and pursue Tenna Delaine  ----- Message ----- From: Leontine Locket, CMA Sent: 04/16/2017  10:24 AM To: [name], MD Subject: Remicade Infusion Side-Effects                 Called Mr. Creedon to check on status. Per spouse hurting all over. Back pain started during infusion. Had a headache, chills, sweats, and fever. Left hand numbness, swollen...infusion took place in the right hand.   Please advise.  Thanks

## 2017-05-05 ENCOUNTER — Telehealth: Payer: Self-pay

## 2017-05-05 ENCOUNTER — Other Ambulatory Visit: Payer: Self-pay

## 2017-05-05 DIAGNOSIS — K51019 Ulcerative (chronic) pancolitis with unspecified complications: Secondary | ICD-10-CM

## 2017-05-05 NOTE — Telephone Encounter (Signed)
Faxed referral, [facility] information, notes, and labs to [phone]  Confirmed receipt.

## 2017-05-12 ENCOUNTER — Telehealth: Payer: Self-pay

## 2017-05-12 NOTE — Telephone Encounter (Signed)
LVM for patient callback to schedule Entyvio infusion @ [facility]. Per receptionist, they attempted contact but were unable to contact the patient for scheduling.   Per Renatta, referral coordinator, fee for infusion is $200 every 8 weeks. Patient only has $381 remaining to meet OOP maximum for 2018.

## 2017-05-17 ENCOUNTER — Telehealth: Payer: Self-pay

## 2017-05-17 NOTE — Telephone Encounter (Signed)
Spoke to patient concerning [facility] attempting to contact him for Entyvio infusions.   Patient states PCP sent him to Neurologist concerning hand numbness. He would like to wait for results from that testing before switching to [facility].  Waiting for callback from patient.

## 2017-05-21 ENCOUNTER — Other Ambulatory Visit: Payer: Self-pay

## 2017-05-21 DIAGNOSIS — K51019 Ulcerative (chronic) pancolitis with unspecified complications: Secondary | ICD-10-CM

## 2017-05-21 MED ORDER — VEDOLIZUMAB 300 MG IV SOLR: 300.0000 mg | INTRAVENOUS | 11 refills | Status: DC

## 2017-06-07 DIAGNOSIS — I10 Essential (primary) hypertension: Secondary | ICD-10-CM | POA: Insufficient documentation

## 2017-06-07 DIAGNOSIS — R0602 Shortness of breath: Secondary | ICD-10-CM | POA: Insufficient documentation

## 2017-06-08 ENCOUNTER — Inpatient Hospital Stay: Payer: Medicare Other | Attending: Oncology

## 2017-06-08 ENCOUNTER — Inpatient Hospital Stay (HOSPITAL_BASED_OUTPATIENT_CLINIC_OR_DEPARTMENT_OTHER): Payer: Medicare Other | Admitting: Internal Medicine

## 2017-06-08 VITALS — BP 121/75 | HR 65 | Temp 97.0°F | Resp 20

## 2017-06-08 VITALS — BP 154/76 | HR 84 | Temp 97.2°F | Resp 20 | Ht 72.0 in | Wt 194.0 lb

## 2017-06-08 DIAGNOSIS — Z7901 Long term (current) use of anticoagulants: Secondary | ICD-10-CM | POA: Insufficient documentation

## 2017-06-08 DIAGNOSIS — Z79899 Other long term (current) drug therapy: Secondary | ICD-10-CM

## 2017-06-08 DIAGNOSIS — E559 Vitamin D deficiency, unspecified: Secondary | ICD-10-CM

## 2017-06-08 DIAGNOSIS — K219 Gastro-esophageal reflux disease without esophagitis: Secondary | ICD-10-CM

## 2017-06-08 DIAGNOSIS — K5190 Ulcerative colitis, unspecified, without complications: Secondary | ICD-10-CM | POA: Diagnosis present

## 2017-06-08 DIAGNOSIS — E1165 Type 2 diabetes mellitus with hyperglycemia: Secondary | ICD-10-CM

## 2017-06-08 DIAGNOSIS — I252 Old myocardial infarction: Secondary | ICD-10-CM | POA: Diagnosis not present

## 2017-06-08 DIAGNOSIS — Z8619 Personal history of other infectious and parasitic diseases: Secondary | ICD-10-CM

## 2017-06-08 DIAGNOSIS — Z7982 Long term (current) use of aspirin: Secondary | ICD-10-CM | POA: Diagnosis not present

## 2017-06-08 DIAGNOSIS — I2510 Atherosclerotic heart disease of native coronary artery without angina pectoris: Secondary | ICD-10-CM | POA: Diagnosis not present

## 2017-06-08 DIAGNOSIS — E785 Hyperlipidemia, unspecified: Secondary | ICD-10-CM

## 2017-06-08 DIAGNOSIS — E039 Hypothyroidism, unspecified: Secondary | ICD-10-CM | POA: Diagnosis not present

## 2017-06-08 DIAGNOSIS — Z7984 Long term (current) use of oral hypoglycemic drugs: Secondary | ICD-10-CM | POA: Diagnosis not present

## 2017-06-08 DIAGNOSIS — K51511 Left sided colitis with rectal bleeding: Secondary | ICD-10-CM | POA: Insufficient documentation

## 2017-06-08 DIAGNOSIS — K51919 Ulcerative colitis, unspecified with unspecified complications: Secondary | ICD-10-CM

## 2017-06-08 MED ORDER — VEDOLIZUMAB 300 MG IV SOLR
300.0000 mg | Freq: Once | INTRAVENOUS | Status: AC
  Administered 2017-06-08: 300 mg via INTRAVENOUS
  Filled 2017-06-08: qty 5

## 2017-06-08 MED ORDER — SODIUM CHLORIDE 0.9 % IV SOLN
Freq: Once | INTRAVENOUS | Status: AC
  Administered 2017-06-08: 10:00:00 via INTRAVENOUS
  Filled 2017-06-08: qty 1000

## 2017-06-08 NOTE — Patient Instructions (Signed)
Vedolizumab injection solution What is this medicine? VEDOLIZUMAB (Ve doe LIZ you mab) is used to treat ulcerative colitis and Crohn's disease in adult patients. This medicine may be used for other purposes; ask your health care provider or pharmacist if you have questions. COMMON BRAND NAME(S): Entyvio What should I tell my health care provider before I take this medicine? They need to know if you have any of these conditions: -diabetes -hepatitis B or history of hepatitis B infection -HIV or AIDS -immune system problems -infection or history of infections -liver disease -recently received or scheduled to receive a vaccine -scheduled to have surgery -tuberculosis, a positive skin test for tuberculosis or have recently been in close contact with someone who has tuberculosis - an unusual or allergic reaction to vedolizumab, other medicines, foods, dyes, or preservatives -pregnant or trying to get pregnant -breast-feeding How should I use this medicine? This medicine is for infusion into a vein. It is given by a health care professional in a hospital or clinic setting. A special MedGuide will be given to you by the pharmacist with each prescription and refill. Be sure to read this information carefully each time. Talk to your pediatrician regarding the use of this medicine in children. This medicine is not approved for use in children. Overdosage: If you think you have taken too much of this medicine contact a poison control center or emergency room at once. NOTE: This medicine is only for you. Do not share this medicine with others. What if I miss a dose? It is important not to miss your dose. Call your doctor or health care professional if you are unable to keep an appointment. What may interact with this medicine? -steroid medicines like prednisone or cortisone -TNF-alpha inhibitors like natalizumab, adalimumab, and infliximab -vaccines This list may not describe all possible  interactions. Give your health care provider a list of all the medicines, herbs, non-prescription drugs, or dietary supplements you use. Also tell them if you smoke, drink alcohol, or use illegal drugs. Some items may interact with your medicine. What should I watch for while using this medicine? Your condition will be monitored carefully while you are receiving this medicine. Visit your doctor for regular check ups. Tell your doctor or healthcare professional if your symptoms do not start to get better or if they get worse. Stay away from people who are sick. Call your doctor or health care professional for advice if you get a fever, chills or sore throat, or other symptoms of a cold or flu. Do not treat yourself. In some patients, this medicine may cause a serious brain infection that may cause death. If you have any problems seeing, thinking, speaking, walking, or standing, tell your doctor right away. If you cannot reach your doctor, get urgent medical care. What side effects may I notice from receiving this medicine? Side effects that you should report to your doctor or health care professional as soon as possible: -allergic reactions like skin rash, itching or hives, swelling of the face, lips, or tongue -breathing problems -changes in vision -chest pain -dark urine -depression, feelings of sadness -dizziness -general ill feeling or flu-like symptoms -irregular, missed, or painful menstrual periods -light-colored stools -loss of appetite, nausea -muscle weakness -problems with balance, talking, or walking -right upper belly pain -unusually weak or tired -yellowing of the eyes or skin Side effects that usually do not require medical attention (report to your doctor or health care professional if they continue or are bothersome): -aches, pains -headache -  stomach upset -tiredness This list may not describe all possible side effects. Call your doctor for medical advice about side  effects. You may report side effects to FDA at 1-800-FDA-1088. Where should I keep my medicine? This drug is given in a hospital or clinic and will not be stored at home. NOTE: This sheet is a summary. It may not cover all possible information. If you have questions about this medicine, talk to your doctor, pharmacist, or health care provider.  2018 Elsevier/Gold Standard (2015-10-31 08:36:51)

## 2017-06-08 NOTE — Progress Notes (Signed)
Patient presents to the clinic today for h/o chron's disease and new Entivyo. Pt states that he "unware that I was was receiving this new drug today. I thought I would be still getting the remicade that I was not able to tolerate this drug. I had a lot of joint pain and I have neuropathy in my left wrist to my finger tips. 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 I think the Remicade infusions contributed to the neuropathic pain. I had such severe pain in my arms while receiving my last infusion." Pt stated that Dr. Gentry Roch office never told him when the new start of medication would be. "The last I heard was they were trying to get the infusion set up at [facility] because the cancer center here could not give the infusion, but [facility] was out of my network."  Dr. Gentry Roch office was contacted by Sharyn Lull, [place]. Sharyn Lull spoke to Ginger to confirm that patient would be receiving the [place] today.  On clinical note, patient also requested his cardiac stress test results today which was performed by Dr. Bethanne Ginger office yesterday. Pt states that he has not been notified about these test results. I asked the patient to call the cardiologist office to obtain these test results. I reassured him that these test often take a few days to obtain results and for the cardiologist to read the test results.

## 2017-06-08 NOTE — Assessment & Plan Note (Addendum)
#   Mod-Severe ulcerative colitis- with poor tolerance to Remicaide [infusion reaction-severe myalgias]. Patient is recommended to start on Entivyo- today. Patient understands that his treatments are going to be every 2 weeks for the first month; and then monthly 1; and then every 2 months thereafter.   # Discussed the potential side effects- headaches, arthralgias, naso-pharyngitis with pt in detail. Also discussed the potential immunosuppression from immunomodulating therapy. Patient has been worked up by GI previously- for infections/immunizations.   # Rheumatology evaluation- as per GI; patient had questions regarding referral. Asked patient to contact GI.   # Discussed with Dr.Anna; okay to proceed with Entivyo.   # Further follow-ups/labs- defer to Dr. Serena Croissant.

## 2017-06-08 NOTE — Progress Notes (Signed)
[place] OFFICE PROGRESS NOTE  Patient Care Team: Madelyn Brunner, MD as PCP - General (Internal Medicine) [name], MD as Consulting Physician (Surgery)  Cancer Staging No matching staging information was found for the patient.    No history exists.     Patient had been previously evaluated by Dr. Grayland Ormond prior to his Remicade; however is currently on my schedule because of scheduling error.   INTERVAL HISTORY:  David [name] 71 y.o.  male pleasant patient Long-standing history of ulcerative colitis- moderate to severe followed by Dr. Jerrilyn Cairo. Patient recently had been restarted on Remicade/infliximab- however have poor tolerance with significant joint pains muscle pain.   He has been recommended to switch to [facility]- as the next line of option.  Patient states his diarrhea/abdominal cramping from ulcerative colitis is overall improved. Patient denies any unusual weight loss or lumps or bumps. Denies any nausea vomiting. Appetite is fair. No weight loss.  Recently he had a workup with a stress test with his cardiologist-he is awaiting the results.  REVIEW OF SYSTEMS:  A complete 10 point review of system is done which is negative except mentioned above/history of present illness.   PAST MEDICAL HISTORY :  Past Medical History:  Diagnosis Date  . Anemia   . CAD (coronary artery disease) 07/08/2011   Overview:  a. 07/2006: Anterior ST elevation MI. b. 07/2006: PCI with BMS to LAD and RCA. c. 09/2006: Non ST elevation. d. LVEF 30%.  Discussed ICD, not currently interested.   . 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 ([place]) 5/36/1443  . Dyslipidemia 07/30/2011   Overview:  High triglycerides  . GERD (gastroesophageal reflux disease) 07/08/2011  . History of Clostridium difficile colitis   . History of myocardial infarction   . Hyperlipidemia, unspecified 07/08/2011  . Hypothyroidism   . Hypothyroidism due to acquired atrophy of thyroid  02/15/2015  . Type II diabetes mellitus, uncontrolled ([place]) 07/08/2011  . Vitamin D deficiency     PAST SURGICAL HISTORY :   Past Surgical History:  Procedure Laterality Date  . CARDIAC CATHETERIZATION    . CHOLECYSTECTOMY    . COLONOSCOPY  07/20/2006   Crohn's disease  . CORONARY STENT PLACEMENT    . FLEXIBLE SIGMOIDOSCOPY N/A 09/25/2016   Procedure: FLEXIBLE SIGMOIDOSCOPY;  Surgeon: [name], MD;  Location: [facility] ENDOSCOPY;  Service: Endoscopy;  Laterality: N/A;  . MELANOMA REMOVED    . parotid gland removal      FAMILY HISTORY :   Family History  Problem Relation Age of Onset  . Heart disease Mother   . Heart attack Father   . Heart disease Sister   . Heart disease Brother     SOCIAL HISTORY:   Social History  Substance Use Topics  . Smoking status: Never Smoker  . Smokeless tobacco: Never Used  . Alcohol use No    ALLERGIES:  is allergic to codeine and mesalamine.  MEDICATIONS:  Current Outpatient Prescriptions  Medication Sig Dispense Refill  . ACCU-CHEK AVIVA PLUS test strip 1 each by Other route as needed for other.     Marland Kitchen aspirin EC 81 MG tablet Take 81 mg by mouth daily.     Marland Kitchen atorvastatin (LIPITOR) 40 MG tablet Take 40 mg by mouth daily.     . carvedilol (COREG) 3.125 MG tablet Take 3.125 mg by mouth 2 (two) times daily with a meal.     . Cholecalciferol (VITAMIN D) 2000 units CAPS Take 1 capsule by mouth 2 (two)  times daily.     Marland Kitchen gabapentin (NEURONTIN) 100 MG capsule Take 1 capsule by mouth 3 (three) times daily.  11  . glipiZIDE (GLUCOTROL) 10 MG tablet Take 10 mg by mouth 2 (two) times daily.     Marland Kitchen levothyroxine (SYNTHROID, LEVOTHROID) 50 MCG tablet Take 50 mcg by mouth daily before breakfast.     . Multiple Vitamins-Minerals (MULTIVITAMIN ADULT PO) Take 1 tablet by mouth daily.     . Omega-3 Fatty Acids (FISH OIL PO) Take 1 capsule by mouth 2 (two) times daily.     Marland Kitchen omeprazole (PRILOSEC) 20 MG capsule Take 20 mg by mouth daily.     . Probiotic Product  (PROBIOTIC DAILY PO) Take 1 capsule by mouth 2 (two) times daily.    . ramipril (ALTACE) 5 MG capsule Take 5 mg by mouth daily.     Marland Kitchen warfarin (COUMADIN) 5 MG tablet TAKE 1 TABLET (5 MG TOTAL) BY MOUTH ONCE DAILY.  5  . predniSONE (DELTASONE) 5 MG tablet 43m by mouth every other day (Patient not taking: Reported on 06/08/2017) 100 tablet 0   Current Facility-Administered Medications  Medication Dose Route Frequency Provider Last Rate Last Dose  . inFLIXimab (REMICADE) 5 mg/kg = 400 mg in sodium chloride 0.9 % 250 mL infusion  5 mg/kg (Order-Specific) Intravenous Q21 days AJonathon Bellows MD        PHYSICAL EXAMINATION: ECOG PERFORMANCE STATUS: 0 - Asymptomatic  BP (!) 154/76 (BP Location: Left Arm, Patient Position: Sitting)   Pulse 84   Temp (!) 97.2 F (36.2 C) (Tympanic)   Resp 20   Ht 6' (1.829 m)   Wt 194 lb 0.1 oz (88 kg)   BMI 26.31 kg/m   Filed Weights   06/08/17 0920  Weight: 194 lb 0.1 oz (88 kg)    GENERAL: Well-nourished well-developed; Alert, no distress and comfortable.   Alone.  EYES: no pallor or icterus OROPHARYNX: no thrush or ulceration; good dentition  NECK: supple, no masses felt LYMPH:  no palpable lymphadenopathy in the cervical, axillary or inguinal regions LUNGS: clear to auscultation and  No wheeze or crackles HEART/CVS: regular rate & rhythm and no murmurs; No lower extremity edema ABDOMEN:abdomen soft, non-tender and normal bowel sounds Musculoskeletal:no cyanosis of digits and no clubbing  PSYCH: alert & oriented x 3 with fluent speech NEURO: no focal motor/sensory deficits SKIN:  no rashes or significant lesions  LABORATORY DATA:  I have reviewed the data as listed    Component Value Date/Time   NA 137 04/01/2017 0815   K 3.5 04/01/2017 0815   CL 105 04/01/2017 0815   CO2 26 04/01/2017 0815   GLUCOSE 92 04/01/2017 0815   BUN 13 04/01/2017 0815   CREATININE 0.93 04/01/2017 0815   CALCIUM 8.9 04/01/2017 0815   PROT 7.2 04/01/2017 0815    ALBUMIN 3.9 04/01/2017 0815   AST 25 04/01/2017 0815   ALT 17 04/01/2017 0815   ALKPHOS 50 04/01/2017 0815   BILITOT 1.3 (H) 04/01/2017 0815   GFRNONAA >60 04/01/2017 0815   GFRAA >60 04/01/2017 0815    No results found for: SPEP, UPEP  Lab Results  Component Value Date   WBC 7.6 11/26/2016   NEUTROABS 5.8 11/26/2016   HGB 12.1 (L) 11/26/2016   HCT 34.5 (L) 11/26/2016   MCV 90.1 11/26/2016   PLT 282 11/26/2016      Chemistry      Component Value Date/Time   NA 137 04/01/2017 0815   K 3.5  04/01/2017 0815   CL 105 04/01/2017 0815   CO2 26 04/01/2017 0815   BUN 13 04/01/2017 0815   CREATININE 0.93 04/01/2017 0815      Component Value Date/Time   CALCIUM 8.9 04/01/2017 0815   ALKPHOS 50 04/01/2017 0815   AST 25 04/01/2017 0815   ALT 17 04/01/2017 0815   BILITOT 1.3 (H) 04/01/2017 0815       RADIOGRAPHIC STUDIES: I have personally reviewed the radiological images as listed and agreed with the findings in the report. No results found.   ASSESSMENT & PLAN:  Left sided ulcerative colitis with rectal bleeding (HCC) # Mod-Severe ulcerative colitis- with poor tolerance to Remicaide [infusion reaction-severe myalgias]. Patient is recommended to start on Entivyo- today. Patient understands that his treatments are going to be every 2 weeks for the first month; and then monthly 1; and then every 2 months thereafter.   # Discussed the potential side effects- headaches, arthralgias, naso-pharyngitis with pt in detail. Also discussed the potential immunosuppression from immunomodulating therapy. Patient has been worked up by GI previously- for infections/immunizations.   # Rheumatology evaluation- as per GI; patient had questions regarding referral. Asked patient to contact GI.   # Discussed with Dr.Anna; okay to proceed with Entivyo.   # Further follow-ups/labs- defer to Dr. Serena Croissant.    No orders of the defined types were placed in this encounter.  All questions  were answered. The patient knows to call the clinic with any problems, questions or concerns.      Cammie Sickle, MD 06/08/2017 1:08 PM

## 2017-06-25 ENCOUNTER — Ambulatory Visit: Payer: Medicare Other | Admitting: Oncology

## 2017-06-25 ENCOUNTER — Inpatient Hospital Stay: Payer: Medicare Other | Attending: Internal Medicine

## 2017-06-25 VITALS — BP 125/73 | HR 60 | Temp 95.8°F | Resp 20

## 2017-06-25 DIAGNOSIS — K51919 Ulcerative colitis, unspecified with unspecified complications: Secondary | ICD-10-CM | POA: Diagnosis not present

## 2017-06-25 DIAGNOSIS — Z79899 Other long term (current) drug therapy: Secondary | ICD-10-CM | POA: Diagnosis not present

## 2017-06-25 MED ORDER — SODIUM CHLORIDE 0.9 % IV SOLN
Freq: Once | INTRAVENOUS | Status: AC
  Administered 2017-06-25: 10:00:00 via INTRAVENOUS
  Filled 2017-06-25: qty 1000

## 2017-06-25 MED ORDER — VEDOLIZUMAB 300 MG IV SOLR
300.0000 mg | Freq: Once | INTRAVENOUS | Status: AC
  Administered 2017-06-25: 300 mg via INTRAVENOUS
  Filled 2017-06-25: qty 5

## 2017-07-02 ENCOUNTER — Ambulatory Visit: Payer: Medicare Other | Admitting: Oncology

## 2017-07-07 ENCOUNTER — Other Ambulatory Visit: Payer: Self-pay | Admitting: Cardiology

## 2017-07-07 ENCOUNTER — Encounter
Admission: RE | Disposition: A | Discharge status: Home / Self Care | Payer: Self-pay | Source: Ambulatory Visit | Attending: Cardiology

## 2017-07-07 ENCOUNTER — Encounter: Payer: Self-pay | Admitting: *Deleted

## 2017-07-07 ENCOUNTER — Ambulatory Visit
Admission: RE | Admit: 2017-07-07 | Discharge: 2017-07-07 | Disposition: A | Discharge status: Home / Self Care | Payer: Medicare Other | Source: Ambulatory Visit | Attending: Cardiology | Admitting: Cardiology

## 2017-07-07 DIAGNOSIS — K5090 Crohn's disease, unspecified, without complications: Secondary | ICD-10-CM | POA: Diagnosis not present

## 2017-07-07 DIAGNOSIS — R0602 Shortness of breath: Secondary | ICD-10-CM | POA: Insufficient documentation

## 2017-07-07 DIAGNOSIS — Z955 Presence of coronary angioplasty implant and graft: Secondary | ICD-10-CM | POA: Diagnosis not present

## 2017-07-07 DIAGNOSIS — Z7901 Long term (current) use of anticoagulants: Secondary | ICD-10-CM | POA: Diagnosis not present

## 2017-07-07 DIAGNOSIS — I2510 Atherosclerotic heart disease of native coronary artery without angina pectoris: Secondary | ICD-10-CM | POA: Insufficient documentation

## 2017-07-07 DIAGNOSIS — Z7984 Long term (current) use of oral hypoglycemic drugs: Secondary | ICD-10-CM | POA: Diagnosis not present

## 2017-07-07 DIAGNOSIS — I252 Old myocardial infarction: Secondary | ICD-10-CM | POA: Diagnosis not present

## 2017-07-07 DIAGNOSIS — I255 Ischemic cardiomyopathy: Secondary | ICD-10-CM | POA: Diagnosis not present

## 2017-07-07 DIAGNOSIS — E559 Vitamin D deficiency, unspecified: Secondary | ICD-10-CM | POA: Insufficient documentation

## 2017-07-07 DIAGNOSIS — Z880 Allergy status to penicillin: Secondary | ICD-10-CM | POA: Diagnosis not present

## 2017-07-07 DIAGNOSIS — Z7982 Long term (current) use of aspirin: Secondary | ICD-10-CM | POA: Diagnosis not present

## 2017-07-07 DIAGNOSIS — E782 Mixed hyperlipidemia: Secondary | ICD-10-CM | POA: Insufficient documentation

## 2017-07-07 DIAGNOSIS — K219 Gastro-esophageal reflux disease without esophagitis: Secondary | ICD-10-CM | POA: Diagnosis not present

## 2017-07-07 DIAGNOSIS — I10 Essential (primary) hypertension: Secondary | ICD-10-CM | POA: Diagnosis not present

## 2017-07-07 DIAGNOSIS — Z8249 Family history of ischemic heart disease and other diseases of the circulatory system: Secondary | ICD-10-CM | POA: Diagnosis not present

## 2017-07-07 DIAGNOSIS — I2582 Chronic total occlusion of coronary artery: Secondary | ICD-10-CM | POA: Diagnosis not present

## 2017-07-07 DIAGNOSIS — E119 Type 2 diabetes mellitus without complications: Secondary | ICD-10-CM | POA: Diagnosis not present

## 2017-07-07 HISTORY — PX: LEFT HEART CATH: CATH118248

## 2017-07-07 HISTORY — PX: CORONARY ANGIOGRAPHY: CATH118303

## 2017-07-07 LAB — PROTIME-INR
INR: 1.02
Prothrombin Time: 13.3 s (ref 11.4–15.2)

## 2017-07-07 LAB — GLUCOSE, CAPILLARY: Glucose-Capillary: 89 mg/dL (ref 65–99)

## 2017-07-07 SURGERY — LEFT HEART CATH
Anesthesia: Moderate Sedation

## 2017-07-07 MED ORDER — SODIUM CHLORIDE 0.9 % IV SOLN: 250.0000 mL | INTRAVENOUS | Status: DC | PRN

## 2017-07-07 MED ORDER — SODIUM CHLORIDE 0.9 % IV SOLN: INTRAVENOUS | Status: DC

## 2017-07-07 MED ORDER — SODIUM CHLORIDE 0.9 % WEIGHT BASED INFUSION: 1.0000 mL/kg/h | INTRAVENOUS | Status: DC

## 2017-07-07 MED ORDER — ASPIRIN 81 MG PO CHEW
CHEWABLE_TABLET | ORAL | Status: AC
  Filled 2017-07-07: qty 1

## 2017-07-07 MED ORDER — ASPIRIN 81 MG PO CHEW
81.0000 mg | CHEWABLE_TABLET | ORAL | Status: AC
  Administered 2017-07-07: 81 mg via ORAL

## 2017-07-07 MED ORDER — SODIUM CHLORIDE 0.9% FLUSH: 3.0000 mL | Freq: Two times a day (BID) | INTRAVENOUS | Status: DC

## 2017-07-07 MED ORDER — FENTANYL CITRATE (PF) 100 MCG/2ML IJ SOLN
INTRAMUSCULAR | Status: DC | PRN
  Administered 2017-07-07: 25 ug via INTRAVENOUS

## 2017-07-07 MED ORDER — FENTANYL CITRATE (PF) 100 MCG/2ML IJ SOLN
INTRAMUSCULAR | Status: AC
  Filled 2017-07-07: qty 2

## 2017-07-07 MED ORDER — HEPARIN (PORCINE) IN NACL 2-0.9 UNIT/ML-% IJ SOLN
INTRAMUSCULAR | Status: AC
  Filled 2017-07-07: qty 500

## 2017-07-07 MED ORDER — SODIUM CHLORIDE 0.9 % WEIGHT BASED INFUSION
3.0000 mL/kg/h | INTRAVENOUS | Status: AC
  Administered 2017-07-07: 3 mL/kg/h via INTRAVENOUS

## 2017-07-07 MED ORDER — MIDAZOLAM HCL 2 MG/2ML IJ SOLN
INTRAMUSCULAR | Status: AC
  Filled 2017-07-07: qty 2

## 2017-07-07 MED ORDER — SODIUM CHLORIDE 0.9% FLUSH: 3.0000 mL | INTRAVENOUS | Status: DC | PRN

## 2017-07-07 MED ORDER — MIDAZOLAM HCL 2 MG/2ML IJ SOLN
INTRAMUSCULAR | Status: DC | PRN
  Administered 2017-07-07: 1 mg via INTRAVENOUS

## 2017-07-07 MED ORDER — IOPAMIDOL (ISOVUE-300) INJECTION 61%
INTRAVENOUS | Status: DC | PRN
  Administered 2017-07-07: 100 mL via INTRA_ARTERIAL

## 2017-07-07 SURGICAL SUPPLY — 9 items
CATH INFINITI 5FR ANG PIGTAIL (CATHETERS) ×4 IMPLANT
CATH INFINITI 5FR JL4 (CATHETERS) ×4 IMPLANT
CATH INFINITI JR4 5F (CATHETERS) ×4 IMPLANT
DEVICE CLOSURE MYNXGRIP 5F (Vascular Products) ×4 IMPLANT
KIT MANI 3VAL PERCEP (MISCELLANEOUS) ×4 IMPLANT
NEEDLE PERC 18GX7CM (NEEDLE) ×4 IMPLANT
PACK CARDIAC CATH (CUSTOM PROCEDURE TRAY) ×4 IMPLANT
SHEATH AVANTI 5FR X 11CM (SHEATH) ×4 IMPLANT
WIRE EMERALD 3MM-J .035X150CM (WIRE) ×4 IMPLANT

## 2017-07-07 NOTE — H&P (Signed)
Chief Complaint: Chief Complaint  Patient presents with  . discuss stress test  Date of Service: 06/11/2017 Date of Birth: 1946-07-27 PCP: Charlett Lango, MD  History of Present Illness: David [name] is a 71 y.o.male patient who is for a follow-up visit. He has a history of coronary artery disease status post PCI as well as a history of colitis and hyperlipidemia. He has been noting dyspnea on exertion when ambulating uphill. This is gradually increased in frequency and severity. Denies syncope or presyncope. Denies any rapid or irregular heartbeat. Has been compliant with his medications. He denies rest symptoms but does noted with activity. He has some generalized fatigue. He is being treated for his ulcerative colitis with success but does have some common side effects to current therapy. Patient continues have dyspnea on exertion. EF by stress test done last week showed an EF of 40% with evidence of probable inferior ischemia. We discussed consideration of left cardiac cath. Echo is pending. Patient would like to defer cardiac catheterization until he has completed more treatment of his ulcerative colitis.  Past Medical and Surgical History  Past Medical History Past Medical History:  Diagnosis Date  . Anemia, unspecified  . C. difficile colitis 07/08/2011  . CAD (coronary artery disease) 07/08/2011  . Crohn's disease (CMS-HCC)  . DIABETES MELLITUS 07/08/2011  . GERD (gastroesophageal reflux disease) 07/08/2011  . History of prostatitis  . Hyperlipidemia, unspecified, unspecified 07/08/2011  . 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 Myocardial infarction (CMS-HCC)  with ischemic cardiomyopathy  . ULCERATIVE COLITIS 07/08/2011  . Vitamin D deficiency, unspecified   Past Surgical History He has a past surgical history that includes Cardiac catheterization; Coronary stent placement; Melanoma; and Colonoscopy (07/20/06).   Medications and Allergies  Current  Medications  Current Outpatient Prescriptions  Medication Sig Dispense Refill  . ACCU-CHEK AVIVA PLUS METER Misc USE AS DIRECTED. 1 each 0  . ACCU-CHEK AVIVA PLUS TEST STRP test strip USE 2 (TWO) TIMES DAILY. USE AS INSTRUCTED. 100 each 2  . ACCU-CHEK SOFTCLIX LANCETS lancets USE 2 TIMES DAILY. USE AS INSTRUCTED. 100 each 2  . aspirin 81 mg tablet 1 tab by mouth daily  . atorvastatin (LIPITOR) 40 MG tablet TAKE 1 TABLET BY MOUTH ONCE DAILY 90 tablet 3  . carvedilol (COREG) 3.125 MG tablet TAKE 1 TABLET BY MOUTH TWO TIMES DAILY WITH MEALS 180 tablet 3  . cholecalciferol (VITAMIN D3) 2,000 unit capsule 1 tab by mouth daily  . gabapentin (NEURONTIN) 100 MG capsule Take 1 capsule (100 mg total) by mouth 3 (three) times daily. 90 capsule 11  . glipiZIDE (GLUCOTROL) 10 MG tablet TAKE 1 TABLET BY MOUTH TWO TIMES DAILY BEFORE MEALS 180 tablet 3  . inFLIXimab (REMICADE) 100 mg injection 65m/kg starting week 0, 2, 6. Then every 8 weeks thereafter  . levothyroxine (SYNTHROID, LEVOTHROID) 50 MCG tablet TAKE 1 TABLET BY MOUTH ONCE DAILY ON AN EMPTY STOMACH WITH A GLASS OF WATER 30-60 MINUTES BEFORE BREAKFAST 90 tablet 3  . multivitamin capsule 1 cap by mouth daily  . nystatin (MYCOSTATIN) 100,000 unit/gram cream Apply topically 2 (two) times daily. 30 g 0  . omega-3 fatty acids-fish oil 360-1,200 mg Cap 2 cap by mouth daily  . omeprazole (PRILOSEC) 20 MG DR capsule TAKE 1 CAPSULE BY MOUTH ONCE DAILY 90 capsule 3  . ramipril (ALTACE) 5 MG capsule TAKE 1 CAPSULE BY MOUTH ONCE DAILY 90 capsule 3  . vedolizumab (ENTYVIO) 300 mg injection Inject into the vein.  .Marland Kitchen  warfarin (COUMADIN) 5 MG tablet Take 1 tablet (5 mg total) by mouth once daily. 30 tablet 5   No current facility-administered medications for this visit.   Allergies: Amoxicillin; Codeine; and Mesalamine  Social and Family History  Social History reports that he has never smoked. He has never used smokeless tobacco. He reports that he does not  drink alcohol or use drugs.  Family History Family History  Problem Relation Age of Onset  . Myocardial Infarction (Heart attack) Father  . Heart disease Brother  . Heart disease Brother  . Heart disease Sister  . Heart disease Mother   Review of Systems  Review of Systems  Constitutional: Negative for chills, diaphoresis, fever, malaise/fatigue and weight loss.  HENT: Negative for congestion, ear discharge, hearing loss and tinnitus.  Eyes: Negative for blurred vision.  Respiratory: Positive for shortness of breath. Negative for cough, hemoptysis, sputum production and wheezing.  Cardiovascular: Positive for chest pain. Negative for palpitations, orthopnea, claudication, leg swelling and PND.  Gastrointestinal: Positive for diarrhea. Negative for abdominal pain, blood in stool, constipation, heartburn, melena, nausea and vomiting.  Genitourinary: Negative for dysuria, frequency, hematuria and urgency.  Musculoskeletal: Negative for back pain, falls, joint pain and myalgias.  Skin: Negative for itching and rash.  Neurological: Positive for tingling. Negative for dizziness, focal weakness, loss of consciousness, weakness and headaches.  Endo/Heme/Allergies: Negative for polydipsia. Does not bruise/bleed easily.  Psychiatric/Behavioral: Negative for depression, memory loss and substance abuse. The patient is not nervous/anxious.   Physical Examination   Vitals:BP 140/88  Pulse 80  Resp 12  Ht 177.8 cm (5' 10" )  Wt 85.7 kg (189 lb)  BMI 27.12 kg/m  Ht:177.8 cm (5' 10" ) Wt:85.7 kg (189 lb) XQJ:JHER surface area is 2.06 meters squared. Body mass index is 27.12 kg/m.  Wt Readings from Last 3 Encounters:  06/11/17 85.7 kg (189 lb)  06/04/17 84.4 kg (186 lb)  06/01/17 83.7 kg (184 lb 9.6 oz)   BP Readings from Last 3 Encounters:  06/11/17 140/88  06/04/17 120/64  06/01/17 140/82   General appearance appears in no acute distress  Head Mouth and Eye exam Normocephalic,  without obvious abnormality, atraumatic Dentition is good Eyes appear anicteric   Neck exam Thyroid: normal  Nodes: no obvious adenopathy  LUNGS Breath Sounds: Normal Percussion: Normal  CARDIOVASCULAR JVP CV wave: no HJR: no Elevation at 90 degrees: None Carotid Pulse: normal pulsation bilaterally Bruit: None Apex: apical impulse normal  Auscultation Rhythm: normal sinus rhythm S1: normal S2: normal Clicks: no Rub: no Murmurs: no murmurs  Gallop: None ABDOMEN Liver enlargement: no Pulsatile aorta: no Ascites: no Bruits: no  EXTREMITIES Clubbing: no Edema: trace to 1+ bilateral pedal edema Pulses: peripheral pulses symmetrical Femoral Bruits: no Amputation: no SKIN Rash: no Cyanosis: no Embolic phemonenon: no Bruising: no NEURO Alert and Oriented to person, place and time: yes Non focal: yes  PSYCH: Pt appears to have normal affect  LABS REVIEWED Last 3 CBC results: Lab Results  Component Value Date  WBC 5.9 08/19/2016  WBC 5.5 08/13/2015  WBC 6.7 08/07/2014   Lab Results  Component Value Date  HGB 12.3 (L) 02/22/2017  HGB 12.7 (L) 08/19/2016  HGB 13.1 (L) 08/13/2015   Lab Results  Component Value Date  HCT 36.1 (L) 08/19/2016  HCT 38.2 (L) 08/13/2015  HCT 39.6 (L) 08/07/2014   Lab Results  Component Value Date  PLT 237 08/19/2016  PLT 242 08/13/2015  PLT 266 08/07/2014   Lab Results  Component  Value Date  CREATININE 1.0 05/25/2017  BUN 18 05/25/2017  NA 138 05/25/2017  K 4.2 05/25/2017  CL 103 05/25/2017  CO2 27.4 05/25/2017   Lab Results  Component Value Date  HGBA1C 6.2 (H) 05/25/2017   Lab Results  Component Value Date  HDL 26.7 (L) 08/19/2016  HDL 28.0 (L) 08/13/2015  HDL 27.5 (L) 02/08/2015   Lab Results  Component Value Date  LDLCALC 55 08/19/2016  LDLCALC 58 08/13/2015  LDLCALC 51 02/08/2015   Lab Results  Component Value Date  TRIG 328 (H) 08/19/2016  TRIG 318 (H) 08/13/2015  TRIG 308 (H) 02/08/2015    Lab Results  Component Value Date  ALT 23 08/19/2016  AST 23 08/19/2016  ALKPHOS 59 08/19/2016   Lab Results  Component Value Date  TSH 4.483 08/19/2016   Diagnostic Studies Reviewed:  EKG EKG demonstrated normal sinus rhythm, nonspecific ST and T waves changes.  Assessment and Plan   71 y.o. male with  ICD-10-CM ICD-9-CM  1. Hypertension, essential-continue with carvedilol 3.125 mg twice daily, ramipril 5 mg daily. Low-sodium diet is recommended. Weight loss is recommended. Blood pressure appears stable at present so will follow I10 401.9  2. Coronary artery disease involving native heart without angina pectoris, unspecified vessel or lesion type-there is evidence of inferior ischemia. Will need left cardiac catheterization. Will need to review echo prior to this to guide further recommendations while doing the cardiac catheterization. Per his request, we will proceed with cardiac catheterization last week of September 2018. Should his symptoms worsen prior to that, would consider moving the case up. Will need to be off warfarin 5 days prior to the procedure. I25.10 414.01   3. SOB (shortness of breath)-echo to evaluate for structural valvular disease. Will make further recommendations after this is complete. R06.02 786.05  4. Mixed hyperlipidemia-continue with atorvastatin 40 mg daily. Low-fat diet is recommended. LDL goal of less than 100 is recommended. E78.2 272.2  5. LV thrombus-continue with warfarin chronically as he is doing. INR goal between 2 and 3.  Return in about 4 weeks (around 07/09/2017).  These notes generated with voice recognition software. I apologize for typographical errors.  David Levans, MD    Pt seen and examined. No change from above.

## 2017-07-08 ENCOUNTER — Encounter: Payer: Self-pay | Admitting: Gastroenterology

## 2017-07-09 ENCOUNTER — Other Ambulatory Visit: Payer: Self-pay

## 2017-07-09 DIAGNOSIS — K51011 Ulcerative (chronic) pancolitis with rectal bleeding: Secondary | ICD-10-CM

## 2017-07-13 DIAGNOSIS — I255 Ischemic cardiomyopathy: Secondary | ICD-10-CM | POA: Insufficient documentation

## 2017-07-22 MED ORDER — SODIUM CHLORIDE 0.9 % IV SOLN
300.0000 mg | Freq: Once | INTRAVENOUS | Status: AC
  Administered 2017-07-23: 300 mg via INTRAVENOUS
  Filled 2017-07-22: qty 5

## 2017-07-22 MED ORDER — SODIUM CHLORIDE 0.9 % IV SOLN
Freq: Once | INTRAVENOUS | Status: AC
  Administered 2017-07-23: 10:00:00 via INTRAVENOUS
  Filled 2017-07-22: qty 1000

## 2017-07-23 ENCOUNTER — Inpatient Hospital Stay: Payer: Medicare Other | Attending: Oncology

## 2017-07-23 VITALS — BP 136/74 | HR 65 | Temp 95.8°F | Resp 18

## 2017-07-23 DIAGNOSIS — K5190 Ulcerative colitis, unspecified, without complications: Secondary | ICD-10-CM | POA: Diagnosis not present

## 2017-07-23 DIAGNOSIS — Z79899 Other long term (current) drug therapy: Secondary | ICD-10-CM | POA: Diagnosis not present

## 2017-07-23 DIAGNOSIS — K51919 Ulcerative colitis, unspecified with unspecified complications: Secondary | ICD-10-CM

## 2017-08-10 ENCOUNTER — Other Ambulatory Visit: Payer: Self-pay | Admitting: Cardiology

## 2017-08-10 DIAGNOSIS — I255 Ischemic cardiomyopathy: Secondary | ICD-10-CM

## 2017-08-12 ENCOUNTER — Other Ambulatory Visit: Payer: Self-pay

## 2017-08-12 DIAGNOSIS — K51919 Ulcerative colitis, unspecified with unspecified complications: Secondary | ICD-10-CM

## 2017-08-25 ENCOUNTER — Ambulatory Visit (HOSPITAL_COMMUNITY)
Admission: RE | Admit: 2017-08-25 | Discharge: 2017-08-25 | Disposition: A | Discharge status: Home / Self Care | Payer: Medicare Other | Source: Ambulatory Visit | Attending: Cardiology | Admitting: Cardiology

## 2017-08-25 DIAGNOSIS — I255 Ischemic cardiomyopathy: Secondary | ICD-10-CM | POA: Diagnosis present

## 2017-08-25 DIAGNOSIS — I2510 Atherosclerotic heart disease of native coronary artery without angina pectoris: Secondary | ICD-10-CM | POA: Diagnosis not present

## 2017-08-25 DIAGNOSIS — I219 Acute myocardial infarction, unspecified: Secondary | ICD-10-CM | POA: Insufficient documentation

## 2017-08-25 DIAGNOSIS — I259 Chronic ischemic heart disease, unspecified: Secondary | ICD-10-CM | POA: Diagnosis not present

## 2017-08-25 DIAGNOSIS — R29898 Other symptoms and signs involving the musculoskeletal system: Secondary | ICD-10-CM | POA: Insufficient documentation

## 2017-08-25 DIAGNOSIS — I340 Nonrheumatic mitral (valve) insufficiency: Secondary | ICD-10-CM | POA: Insufficient documentation

## 2017-08-25 DIAGNOSIS — I517 Cardiomegaly: Secondary | ICD-10-CM | POA: Insufficient documentation

## 2017-08-25 LAB — POCT I-STAT CREATININE: Creatinine, Ser: 1.1 mg/dL (ref 0.61–1.24)

## 2017-08-25 MED ORDER — REGADENOSON 0.4 MG/5ML IV SOLN
INTRAVENOUS | Status: AC
  Filled 2017-08-25: qty 5

## 2017-08-25 MED ORDER — ATROPINE SULFATE 1 MG/10ML IJ SOSY
PREFILLED_SYRINGE | INTRAMUSCULAR | Status: AC
  Filled 2017-08-25: qty 10

## 2017-08-25 MED ORDER — GADOBENATE DIMEGLUMINE 529 MG/ML IV SOLN
28.0000 mL | Freq: Once | INTRAVENOUS | Status: AC | PRN
  Administered 2017-08-25: 28 mL via INTRAVENOUS

## 2017-08-27 DIAGNOSIS — G609 Hereditary and idiopathic neuropathy, unspecified: Secondary | ICD-10-CM | POA: Insufficient documentation

## 2017-09-14 ENCOUNTER — Telehealth: Payer: Self-pay | Admitting: Pharmacist

## 2017-09-14 ENCOUNTER — Telehealth: Payer: Self-pay | Admitting: Gastroenterology

## 2017-09-14 NOTE — Telephone Encounter (Signed)
Myra with [facility] called and stated that the patient is coming Friday for entivio treatment and the Specialty Pharmacy is out of refills. You will need to call the Pharmacy to expedite the Rx and NOT fax. If you have any questions please call Myra at [phone].

## 2017-09-14 NOTE — Telephone Encounter (Signed)
[place] [phone]) for a refill of Entyvio. Patient is scheduled for Friday 09/17/17. [facility] states there are no refills. Called [facility] to have them call [facility] to give verbal order so that medication can ship out to me before Friday. I left my cell # with [facility] because I had to leave a detailed message with their office.

## 2017-09-15 ENCOUNTER — Telehealth: Payer: Self-pay

## 2017-09-15 ENCOUNTER — Other Ambulatory Visit: Payer: Self-pay

## 2017-09-15 NOTE — Telephone Encounter (Signed)
Contacted [name] to add additional refills to patients Rx for [employer].   Returned David [name]'s call and advised on update.

## 2017-09-15 NOTE — Telephone Encounter (Signed)
Completed Entyvio re-enrollment PAP form and faxed to [facility].   Advised for patient to sign and bring documents to next office visit.

## 2017-09-16 ENCOUNTER — Encounter: Payer: Self-pay | Admitting: Gastroenterology

## 2017-09-16 ENCOUNTER — Other Ambulatory Visit: Payer: Self-pay

## 2017-09-16 DIAGNOSIS — K51011 Ulcerative (chronic) pancolitis with rectal bleeding: Secondary | ICD-10-CM

## 2017-09-16 NOTE — Progress Notes (Signed)
Changed patient's office visit due to Entyvio infusion being moved to 12/14.  Faxed lab orders to [facility].  Called to advise [name] of orders.   Patient to bring David [name] re-certification form with him to 12/18 appt.

## 2017-09-17 ENCOUNTER — Ambulatory Visit: Payer: Medicare Other

## 2017-09-21 ENCOUNTER — Ambulatory Visit: Payer: Medicare Other | Admitting: Gastroenterology

## 2017-09-23 ENCOUNTER — Telehealth: Payer: Self-pay

## 2017-09-23 NOTE — Telephone Encounter (Signed)
Received messages from [place] and [name] concerning Mr. Giacobbe's shipment of Entyvio.   Mr. Docken was scheduled to have infusion tomorrow, 12/14, which has been cancelled.   Messages were received from the [facility] after [place].   Patient's refill Rx was setup verbally 2 weeks prior but the pharmacist had not scheduled the delivery as promised.   Rx to be delivered to [facility] on Tuesday, 12/18.  Left Myra vm for callback.   [name] has left for the day. Will contact in the am on 12/14.

## 2017-09-24 ENCOUNTER — Other Ambulatory Visit
Admission: RE | Admit: 2017-09-24 | Discharge: 2017-09-24 | Disposition: A | Discharge status: Home / Self Care | Payer: Medicare Other | Source: Ambulatory Visit | Attending: Gastroenterology | Admitting: Gastroenterology

## 2017-09-24 ENCOUNTER — Ambulatory Visit: Payer: Medicare Other

## 2017-09-24 DIAGNOSIS — K51919 Ulcerative colitis, unspecified with unspecified complications: Secondary | ICD-10-CM | POA: Diagnosis present

## 2017-09-24 LAB — CBC WITH DIFFERENTIAL/PLATELET
BASOS ABS: 0 10*3/uL (ref 0–0.1)
BASOS PCT: 0 %
EOS ABS: 0.1 10*3/uL (ref 0–0.7)
EOS PCT: 2 %
HCT: 37.9 % — ABNORMAL LOW (ref 40.0–52.0)
Hemoglobin: 13.1 g/dL (ref 13.0–18.0)
Lymphocytes Relative: 33 %
Lymphs Abs: 2 10*3/uL (ref 1.0–3.6)
MCH: 30.8 pg (ref 26.0–34.0)
MCHC: 34.7 g/dL (ref 32.0–36.0)
MCV: 88.8 fL (ref 80.0–100.0)
MONO ABS: 0.7 10*3/uL (ref 0.2–1.0)
Monocytes Relative: 11 %
Neutro Abs: 3.2 10*3/uL (ref 1.4–6.5)
Neutrophils Relative %: 54 %
PLATELETS: 240 10*3/uL (ref 150–440)
RBC: 4.27 MIL/uL — ABNORMAL LOW (ref 4.40–5.90)
RDW: 13.7 % (ref 11.5–14.5)
WBC: 6 10*3/uL (ref 3.8–10.6)

## 2017-09-24 LAB — COMPREHENSIVE METABOLIC PANEL
ALBUMIN: 3.7 g/dL (ref 3.5–5.0)
ALT: 18 U/L (ref 17–63)
ANION GAP: 8 (ref 5–15)
AST: 25 U/L (ref 15–41)
Alkaline Phosphatase: 71 U/L (ref 38–126)
BUN: 8 mg/dL (ref 6–20)
CHLORIDE: 106 mmol/L (ref 101–111)
CO2: 25 mmol/L (ref 22–32)
Calcium: 8.9 mg/dL (ref 8.9–10.3)
Creatinine, Ser: 1 mg/dL (ref 0.61–1.24)
GFR calc Af Amer: >60 mL/min (ref >60)
GFR calc non Af Amer: >60 mL/min (ref >60)
GLUCOSE: 89 mg/dL (ref 65–99)
POTASSIUM: 3.9 mmol/L (ref 3.5–5.1)
Sodium: 139 mmol/L (ref 135–145)
Total Bilirubin: 0.8 mg/dL (ref 0.3–1.2)
Total Protein: 7.3 g/dL (ref 6.5–8.1)

## 2017-09-28 ENCOUNTER — Encounter: Payer: Self-pay | Admitting: Gastroenterology

## 2017-09-28 ENCOUNTER — Ambulatory Visit (INDEPENDENT_AMBULATORY_CARE_PROVIDER_SITE_OTHER): Payer: Medicare Other | Admitting: Gastroenterology

## 2017-09-28 VITALS — BP 116/73 | HR 73 | Temp 98.0°F | Ht 72.0 in | Wt 190.6 lb

## 2017-09-28 DIAGNOSIS — K51919 Ulcerative colitis, unspecified with unspecified complications: Secondary | ICD-10-CM | POA: Diagnosis not present

## 2017-09-28 NOTE — Progress Notes (Signed)
[name] MD, MRCP(U.K) [address]  [place]  [place], [place] [address]  Main: [phone]  Fax: [phone]   Primary Care Physician: Madelyn Brunner, MD  Primary Gastroenterologist:  Dr. [name]   Chief Complaint  Patient presents with  . Follow-up    HPI: David [name] is a 71 y.o. male   Summary of history :  Hehas had ulcerative colitis from 2007 . 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 Remicaid d/c in 2009 due to the malignant skin lesions. Since 2009 Not been on any medications. He was being followed by [facility] till 2015 when his doctor retired. Hospitalized in 2011 for flare of the colitis. His symptoms returned in early December when I took over his care . I performed a sigmoidoscopy on 09/25/16 and I noted moderate to severe left sided colitis. Biopsies Confirmed marked active colitis negative for HSV and CMV,Commenced him on mesalamine which he did not tolerate and was admitted to the hospital with symptoms. 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 Restarted on prednisone after a few days of antibiotics. Likely he had the clot secondary to colitis and subsequently the clot may have been infected. We wanted to transfer him to a tertiary center but he refused . He  was seen at [facility] Dr David [name] who specializes in IBD for a second opinion as he has had prior skin cancer with regards to options. She saw him on 12/01/16.Commenced on remicaid 01/05/17.3 days after the first infusion had no blood or diarrhea and firming up of stools. Labs 04/01/17- CRP 0.9, normal vitamin D. Not immune to hep A. CMP normal.   Interval history   03/2017-09/2017   Labs 09/2017 : CMP-normal ,Hb 13.1 .Commenced on Entyvio in 05/2017 .  1 bowel movement a day , some days none. No abdominal pain.   Recently found out on cardiac cath had a blocked blood vessel and commenced on plavix+entresto.  Weight stable.   BP 116/73 (BP Location: Right Arm, Patient Position: Sitting, Cuff Size: Large)   Pulse 73   Temp 98 F (36.7 C) (Oral)   Ht 6' (1.829 m)   Wt 190 lb 9.6 oz (86.5 kg)   BMI 25.85 kg/m    Current Outpatient Medications  Medication Sig Dispense Refill  . ACCU-CHEK AVIVA PLUS test strip 1 each by Other route as needed for other.     Marland Kitchen ACCU-CHEK SOFTCLIX LANCETS lancets USE 2 TIMES DAILY. USE AS INSTRUCTED.    Marland Kitchen aspirin EC 81 MG tablet Take 81 mg by mouth daily.     Marland Kitchen atorvastatin (LIPITOR) 40 MG tablet Take 40 mg by mouth every morning.     . carvedilol (COREG) 3.125 MG tablet Take 3.125 mg by mouth 2 (two) times daily with a meal.     . Cholecalciferol (  VITAMIN D) 2000 units CAPS Take 1 capsule by mouth 2 (two) times daily.     . clopidogrel (PLAVIX) 75 MG tablet Take by mouth.    . gabapentin (NEURONTIN) 100 MG capsule Take 1 capsule by mouth 2 (two) times daily.   11  . gabapentin (NEURONTIN) 300 MG capsule Take 300 mg by mouth at bedtime.    Marland Kitchen glipiZIDE (GLUCOTROL) 10 MG tablet Take 10 mg by mouth 2 (two) times daily.     Marland Kitchen levothyroxine (SYNTHROID, LEVOTHROID) 50 MCG tablet Take 50 mcg by mouth daily before breakfast.     . Multiple Vitamins-Minerals (MULTIVITAMIN ADULT PO) Take 1 tablet by mouth daily.     . Omega-3 Fatty Acids (FISH OIL PO) Take 1 capsule by mouth  2 (two) times daily.     Marland Kitchen omeprazole (PRILOSEC) 20 MG capsule Take 20 mg by mouth daily.     . Probiotic Product (PROBIOTIC DAILY PO) Take 1 capsule by mouth daily.     . sacubitril-valsartan (ENTRESTO) 24-26 MG Take by mouth.    . vedolizumab (ENTYVIO) 300 MG injection Inject into the vein. Every 4 weeks    . vitamin B-12 (CYANOCOBALAMIN) 1000 MCG tablet Take 1,000 mcg by mouth daily.    . calcium carbonate (TUMS EX) 750 MG chewable tablet Chew 1 tablet by mouth daily as needed for heartburn.    . nystatin cream (MYCOSTATIN) Apply topically.     Current Facility-Administered Medications  Medication Dose Route Frequency Provider Last Rate Last Dose  . inFLIXimab (REMICADE) 5 mg/kg = 400 mg in sodium chloride 0.9 % 250 mL infusion  5 mg/kg (Order-Specific) Intravenous Q21 days [name], MD        Allergies as of 09/28/2017 - Review Complete 09/28/2017  Allergen Reaction Noted  . Mesalamine Nausea Only and Nausea And Vomiting 12/01/2016    ROS:  General: Negative for anorexia, weight loss, fever, chills, fatigue, weakness. ENT: Negative for hoarseness, difficulty swallowing , nasal congestion. CV: Negative for chest pain, angina, palpitations, dyspnea on exertion, peripheral edema.  Respiratory: Negative for dyspnea at rest, dyspnea on exertion, cough, sputum, wheezing.  GI: See history of present illness. GU:  Negative for dysuria, hematuria, urinary incontinence, urinary frequency, nocturnal urination.  Endo: Negative for unusual weight change.    Physical Examination:   BP 116/73 (BP Location: Right Arm, Patient Position: Sitting, Cuff Size: Large)   Pulse 73   Temp 98 F (36.7 C) (Oral)   Ht 6' (1.829 m)   Wt 190 lb 9.6 oz (86.5 kg)   BMI 25.85 kg/m   General: Well-nourished, well-developed in no acute distress.  Eyes: No icterus. Conjunctivae pink. Mouth: Oropharyngeal mucosa moist and pink , no lesions erythema or exudate. Lungs: Clear to auscultation bilaterally.  Non-labored. Heart: Regular rate and rhythm, no murmurs rubs or gallops.  Abdomen: Bowel sounds are normal, nontender, nondistended, no hepatosplenomegaly or masses, no abdominal bruits or hernia , no rebound or guarding.   Extremities: No lower extremity edema. No clubbing or deformities. Neuro: Alert and oriented x 3.  Grossly intact. Skin: Warm and dry, no jaundice.   Psych: Alert and cooperative, normal mood and affect.   Imaging Studies: No results found.  Assessment and Plan:   David [name] is a 71 y.o. 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 He was seen by Dr David [name] at [facility] who specializes in IBD for a second opinion and we discussed his case after. Options werecolectomy which he was notkeen, use of Vedolizumab which has least possibility of skin cancer . Commenced on infliximab on 01/05/17 . Had severe reactions after the infusions and decided to change to [facility] in 05/2017 . Presently has no symptoms , doing better, biochemically in remission too.    Plan  1. Vitamin D levels, HepA/B serology to determine immunity , Tb quantiferon,CRP  2. We will get in touch with his cardiologist to find out if its safe to do a colonoscopy for dysplasia surveillace , if not the next best alternative would be a CT colonography .     Dr [name]  MD,MRCP [facility]) Follow up in 6 months

## 2017-09-29 ENCOUNTER — Inpatient Hospital Stay: Payer: Medicare Other | Attending: Internal Medicine

## 2017-09-29 VITALS — BP 120/68 | HR 66 | Temp 94.8°F | Resp 18

## 2017-09-29 DIAGNOSIS — K5190 Ulcerative colitis, unspecified, without complications: Secondary | ICD-10-CM | POA: Diagnosis not present

## 2017-09-29 DIAGNOSIS — Z79899 Other long term (current) drug therapy: Secondary | ICD-10-CM | POA: Diagnosis not present

## 2017-09-29 DIAGNOSIS — K51919 Ulcerative colitis, unspecified with unspecified complications: Secondary | ICD-10-CM

## 2017-09-29 MED ORDER — SODIUM CHLORIDE 0.9 % IV SOLN
Freq: Once | INTRAVENOUS | Status: AC
  Administered 2017-09-29: 15:00:00 via INTRAVENOUS
  Filled 2017-09-29: qty 1000

## 2017-09-29 MED ORDER — SODIUM CHLORIDE 0.9 % IV SOLN
300.0000 mg | Freq: Once | INTRAVENOUS | Status: AC
  Administered 2017-09-29: 300 mg via INTRAVENOUS
  Filled 2017-09-29 (×2): qty 5

## 2017-09-29 NOTE — Patient Instructions (Signed)
Vedolizumab injection solution What is this medicine? VEDOLIZUMAB (Ve doe LIZ you mab) is used to treat ulcerative colitis and Crohn's disease in adult patients. This medicine may be used for other purposes; ask your health care provider or pharmacist if you have questions. COMMON BRAND NAME(S): Entyvio What should I tell my health care provider before I take this medicine? They need to know if you have any of these conditions: -diabetes -hepatitis B or history of hepatitis B infection -HIV or AIDS -immune system problems -infection or history of infections -liver disease -recently received or scheduled to receive a vaccine -scheduled to have surgery -tuberculosis, a positive skin test for tuberculosis or have recently been in close contact with someone who has tuberculosis - an unusual or allergic reaction to vedolizumab, other medicines, foods, dyes, or preservatives -pregnant or trying to get pregnant -breast-feeding How should I use this medicine? This medicine is for infusion into a vein. It is given by a health care professional in a hospital or clinic setting. A special MedGuide will be given to you by the pharmacist with each prescription and refill. Be sure to read this information carefully each time. Talk to your pediatrician regarding the use of this medicine in children. This medicine is not approved for use in children. Overdosage: If you think you have taken too much of this medicine contact a poison control center or emergency room at once. NOTE: This medicine is only for you. Do not share this medicine with others. What if I miss a dose? It is important not to miss your dose. Call your doctor or health care professional if you are unable to keep an appointment. What may interact with this medicine? -steroid medicines like prednisone or cortisone -TNF-alpha inhibitors like natalizumab, adalimumab, and infliximab -vaccines This list may not describe all possible  interactions. Give your health care provider a list of all the medicines, herbs, non-prescription drugs, or dietary supplements you use. Also tell them if you smoke, drink alcohol, or use illegal drugs. Some items may interact with your medicine. What should I watch for while using this medicine? Your condition will be monitored carefully while you are receiving this medicine. Visit your doctor for regular check ups. Tell your doctor or healthcare professional if your symptoms do not start to get better or if they get worse. Stay away from people who are sick. Call your doctor or health care professional for advice if you get a fever, chills or sore throat, or other symptoms of a cold or flu. Do not treat yourself. In some patients, this medicine may cause a serious brain infection that may cause death. If you have any problems seeing, thinking, speaking, walking, or standing, tell your doctor right away. If you cannot reach your doctor, get urgent medical care. What side effects may I notice from receiving this medicine? Side effects that you should report to your doctor or health care professional as soon as possible: -allergic reactions like skin rash, itching or hives, swelling of the face, lips, or tongue -breathing problems -changes in vision -chest pain -dark urine -depression, feelings of sadness -dizziness -general ill feeling or flu-like symptoms -irregular, missed, or painful menstrual periods -light-colored stools -loss of appetite, nausea -muscle weakness -problems with balance, talking, or walking -right upper belly pain -unusually weak or tired -yellowing of the eyes or skin Side effects that usually do not require medical attention (report to your doctor or health care professional if they continue or are bothersome): -aches, pains -headache -  stomach upset -tiredness This list may not describe all possible side effects. Call your doctor for medical advice about side  effects. You may report side effects to FDA at 1-800-FDA-1088. Where should I keep my medicine? This drug is given in a hospital or clinic and will not be stored at home. NOTE: This sheet is a summary. It may not cover all possible information. If you have questions about this medicine, talk to your doctor, pharmacist, or health care provider.  2018 Elsevier/Gold Standard (2015-10-31 08:36:51)

## 2017-10-01 ENCOUNTER — Ambulatory Visit: Payer: Medicare Other

## 2017-10-11 ENCOUNTER — Other Ambulatory Visit
Admission: RE | Admit: 2017-10-11 | Discharge: 2017-10-11 | Disposition: A | Discharge status: Home / Self Care | Payer: Medicare Other | Source: Ambulatory Visit | Attending: Gastroenterology | Admitting: Gastroenterology

## 2017-10-11 DIAGNOSIS — K51919 Ulcerative colitis, unspecified with unspecified complications: Secondary | ICD-10-CM | POA: Diagnosis present

## 2017-10-11 LAB — C-REACTIVE PROTEIN: CRP: <0.8 mg/dL (ref <1.0)

## 2017-10-13 LAB — QUANTIFERON-TB GOLD PLUS: QUANTIFERON-TB GOLD PLUS: NEGATIVE

## 2017-10-13 LAB — QUANTIFERON-TB GOLD PLUS (RQFGPL)
QUANTIFERON MITOGEN VALUE: 7.65 [IU]/mL
QUANTIFERON NIL VALUE: 0.03 [IU]/mL
QUANTIFERON TB1 AG VALUE: 0.02 [IU]/mL
QuantiFERON TB2 Ag Value: 0.02 IU/mL

## 2017-10-14 LAB — HEPATITIS B SURFACE ANTIBODY,QUALITATIVE: Hep B S Ab: NONREACTIVE

## 2017-10-14 LAB — HEPATITIS A ANTIBODY, TOTAL: Hep A Total Ab: NEGATIVE

## 2017-10-19 LAB — VITAMIN D 1,25 DIHYDROXY
VITAMIN D3 1, 25 (OH): 33 pg/mL
Vitamin D 1, 25 (OH)2 Total: 35 pg/mL
Vitamin D2 1, 25 (OH)2: <10 pg/mL

## 2017-11-16 ENCOUNTER — Ambulatory Visit: Payer: Medicare Other | Admitting: Internal Medicine

## 2017-11-16 ENCOUNTER — Ambulatory Visit: Payer: Medicare Other

## 2017-11-23 ENCOUNTER — Inpatient Hospital Stay: Payer: Medicare Other | Admitting: Internal Medicine

## 2017-11-23 ENCOUNTER — Inpatient Hospital Stay: Payer: Medicare Other

## 2017-11-23 NOTE — Assessment & Plan Note (Deleted)
#   Mod-Severe ulcerative colitis- with poor tolerance to Remicaide [infusion reaction-severe myalgias]. Patient is recommended to start on Entivyo- today. Patient understands that his treatments are going to be every 2 weeks for the first month; and then monthly 1; and then every 2 months thereafter.   # Discussed the potential side effects- headaches, arthralgias, naso-pharyngitis with pt in detail. Also discussed the potential immunosuppression from immunomodulating therapy. Patient has been worked up by GI previously- for infections/immunizations.   # Rheumatology evaluation- as per GI; patient had questions regarding referral. Asked patient to contact GI.   # Discussed with Dr.Anna; okay to proceed with Entivyo.   # Further follow-ups/labs- defer to Dr. Serena Croissant.

## 2017-11-23 NOTE — Progress Notes (Deleted)
[facility] OFFICE PROGRESS NOTE  Patient Care Team: Kirk Ruths, MD as PCP - General (Internal Medicine) [name], MD as Consulting Physician (Surgery)  Cancer Staging No matching staging information was found for the patient.    No history exists.     Patient had been previously evaluated by Dr. Grayland Ormond prior to his Remicade; however is currently on my schedule because of scheduling error.   INTERVAL HISTORY:  David [name] 72 y.o.  male pleasant patient Long-standing history of ulcerative colitis- moderate to severe followed by Dr. Jerrilyn Cairo. Patient recently had been restarted on Remicade/infliximab- however have poor tolerance with significant joint pains muscle pain.   He has been recommended to switch to [facility]- as the next line of option.  Patient states his diarrhea/abdominal cramping from ulcerative colitis is overall improved. Patient denies any unusual weight loss or lumps or bumps. Denies any nausea vomiting. Appetite is fair. No weight loss.  Recently he had a workup with a stress test with his cardiologist-he is awaiting the results.  REVIEW OF SYSTEMS:  A complete 10 point review of system is done which is negative except mentioned above/history of present illness.   PAST MEDICAL HISTORY :  Past Medical History:  Diagnosis Date  . Anemia   . CAD (coronary artery disease) 07/08/2011   Overview:  a. 07/2006: Anterior ST elevation MI. b. 07/2006: PCI with BMS to LAD and RCA. c. 09/2006: Non ST elevation. d. LVEF 30%.  Discussed ICD, not currently interested.   . 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 ([place]) 4/33/2951  . Dyslipidemia 07/30/2011   Overview:  High triglycerides  . GERD (gastroesophageal reflux disease) 07/08/2011  . History of Clostridium difficile colitis   . History of myocardial infarction   . Hyperlipidemia, unspecified 07/08/2011  . Hypothyroidism   . Hypothyroidism due to acquired atrophy of thyroid  02/15/2015  . Type II diabetes mellitus, uncontrolled ([place]) 07/08/2011  . Vitamin D deficiency     PAST SURGICAL HISTORY :   Past Surgical History:  Procedure Laterality Date  . CARDIAC CATHETERIZATION    . CHOLECYSTECTOMY    . COLONOSCOPY  07/20/2006   Crohn's disease  . CORONARY ANGIOGRAPHY N/A 07/07/2017   Procedure: CORONARY ANGIOGRAPHY;  Surgeon: Teodoro Spray, MD;  Location: [facility];  Service: Cardiovascular;  Laterality: N/A;  . CORONARY STENT PLACEMENT    . FLEXIBLE SIGMOIDOSCOPY N/A 09/25/2016   Procedure: FLEXIBLE SIGMOIDOSCOPY;  Surgeon: [name], MD;  Location: [facility] ENDOSCOPY;  Service: Endoscopy;  Laterality: N/A;  . LEFT HEART CATH Left 07/07/2017   Procedure: Left Heart Cath;  Surgeon: Teodoro Spray, MD;  Location: [facility];  Service: Cardiovascular;  Laterality: Left;  Marland Kitchen MELANOMA REMOVED    . parotid gland removal      FAMILY HISTORY :   Family History  Problem Relation Age of Onset  . Heart disease Mother   . Heart attack Father   . Heart disease Sister   . Heart disease Brother     SOCIAL HISTORY:   Social History   Tobacco Use  . Smoking status: Never Smoker  . Smokeless tobacco: Never Used  Substance Use Topics  . Alcohol use: No  . Drug use: No    ALLERGIES:  is allergic to mesalamine.  MEDICATIONS:  Current Outpatient Medications  Medication Sig Dispense Refill  . ACCU-CHEK AVIVA PLUS test strip 1 each by Other route as needed for other.     Marland Kitchen ACCU-CHEK  SOFTCLIX LANCETS lancets USE 2 TIMES DAILY. USE AS INSTRUCTED.    Marland Kitchen aspirin EC 81 MG tablet Take 81 mg by mouth daily.     Marland Kitchen atorvastatin (LIPITOR) 40 MG tablet Take 40 mg by mouth every morning.     . calcium carbonate (TUMS EX) 750 MG chewable tablet Chew 1 tablet by mouth daily as needed for heartburn.    . carvedilol (COREG) 3.125 MG tablet Take 3.125 mg by mouth 2 (two) times daily with a meal.     . Cholecalciferol (VITAMIN D) 2000 units CAPS Take 1 capsule by  mouth 2 (two) times daily.     . clopidogrel (PLAVIX) 75 MG tablet Take by mouth.    . gabapentin (NEURONTIN) 100 MG capsule Take 1 capsule by mouth 2 (two) times daily.   11  . gabapentin (NEURONTIN) 300 MG capsule Take 300 mg by mouth at bedtime.    Marland Kitchen glipiZIDE (GLUCOTROL) 10 MG tablet Take 10 mg by mouth 2 (two) times daily.     Marland Kitchen levothyroxine (SYNTHROID, LEVOTHROID) 50 MCG tablet Take 50 mcg by mouth daily before breakfast.     . Multiple Vitamins-Minerals (MULTIVITAMIN ADULT PO) Take 1 tablet by mouth daily.     Marland Kitchen nystatin cream (MYCOSTATIN) Apply topically.    . Omega-3 Fatty Acids (FISH OIL PO) Take 1 capsule by mouth 2 (two) times daily.     Marland Kitchen omeprazole (PRILOSEC) 20 MG capsule Take 20 mg by mouth daily.     . Probiotic Product (PROBIOTIC DAILY PO) Take 1 capsule by mouth daily.     . sacubitril-valsartan (ENTRESTO) 24-26 MG Take by mouth.    . vedolizumab (ENTYVIO) 300 MG injection Inject into the vein. Every 4 weeks    . vitamin B-12 (CYANOCOBALAMIN) 1000 MCG tablet Take 1,000 mcg by mouth daily.     Current Facility-Administered Medications  Medication Dose Route Frequency Provider Last Rate Last Dose  . inFLIXimab (REMICADE) 5 mg/kg = 400 mg in sodium chloride 0.9 % 250 mL infusion  5 mg/kg (Order-Specific) Intravenous Q21 days [name], MD        PHYSICAL EXAMINATION: ECOG PERFORMANCE STATUS: 0 - Asymptomatic  There were no vitals taken for this visit.  There were no vitals filed for this visit.  GENERAL: Well-nourished well-developed; Alert, no distress and comfortable.   Alone.  EYES: no pallor or icterus OROPHARYNX: no thrush or ulceration; good dentition  NECK: supple, no masses felt LYMPH:  no palpable lymphadenopathy in the cervical, axillary or inguinal regions LUNGS: clear to auscultation and  No wheeze or crackles HEART/CVS: regular rate & rhythm and no murmurs; No lower extremity edema ABDOMEN:abdomen soft, non-tender and normal bowel  sounds Musculoskeletal:no cyanosis of digits and no clubbing  PSYCH: alert & oriented x 3 with fluent speech NEURO: no focal motor/sensory deficits SKIN:  no rashes or significant lesions  LABORATORY DATA:  I have reviewed the data as listed    Component Value Date/Time   NA 139 09/24/2017 0957   K 3.9 09/24/2017 0957   CL 106 09/24/2017 0957   CO2 25 09/24/2017 0957   GLUCOSE 89 09/24/2017 0957   BUN 8 09/24/2017 0957   CREATININE 1.00 09/24/2017 0957   CALCIUM 8.9 09/24/2017 0957   PROT 7.3 09/24/2017 0957   ALBUMIN 3.7 09/24/2017 0957   AST 25 09/24/2017 0957   ALT 18 09/24/2017 0957   ALKPHOS 71 09/24/2017 0957   BILITOT 0.8 09/24/2017 0957   GFRNONAA >60 09/24/2017 0957  GFRAA >60 09/24/2017 0957    No results found for: SPEP, UPEP  Lab Results  Component Value Date   WBC 6.0 09/24/2017   NEUTROABS 3.2 09/24/2017   HGB 13.1 09/24/2017   HCT 37.9 (L) 09/24/2017   MCV 88.8 09/24/2017   PLT 240 09/24/2017      Chemistry      Component Value Date/Time   NA 139 09/24/2017 0957   K 3.9 09/24/2017 0957   CL 106 09/24/2017 0957   CO2 25 09/24/2017 0957   BUN 8 09/24/2017 0957   CREATININE 1.00 09/24/2017 0957      Component Value Date/Time   CALCIUM 8.9 09/24/2017 0957   ALKPHOS 71 09/24/2017 0957   AST 25 09/24/2017 0957   ALT 18 09/24/2017 0957   BILITOT 0.8 09/24/2017 0957       RADIOGRAPHIC STUDIES: I have personally reviewed the radiological images as listed and agreed with the findings in the report. No results found.   ASSESSMENT & PLAN:  No problem-specific Assessment & Plan notes found for this encounter.   No orders of the defined types were placed in this encounter.  All questions were answered. The patient knows to call the clinic with any problems, questions or concerns.      Cammie Sickle, MD 11/23/2017 8:36 AM

## 2017-11-24 ENCOUNTER — Encounter: Payer: Self-pay | Admitting: Gastroenterology

## 2017-11-30 ENCOUNTER — Ambulatory Visit: Payer: Medicare Other

## 2017-11-30 ENCOUNTER — Ambulatory Visit: Payer: Medicare Other | Admitting: Internal Medicine

## 2017-12-07 ENCOUNTER — Encounter: Payer: Self-pay | Admitting: Internal Medicine

## 2017-12-07 ENCOUNTER — Other Ambulatory Visit: Payer: Self-pay

## 2017-12-07 ENCOUNTER — Inpatient Hospital Stay: Payer: Medicare Other

## 2017-12-07 ENCOUNTER — Inpatient Hospital Stay: Payer: Medicare Other | Attending: Internal Medicine | Admitting: Internal Medicine

## 2017-12-07 VITALS — BP 146/79 | HR 69 | Temp 97.0°F | Resp 20 | Ht 72.0 in | Wt 191.0 lb

## 2017-12-07 VITALS — BP 125/77 | HR 66 | Temp 96.1°F | Resp 20

## 2017-12-07 DIAGNOSIS — Z7982 Long term (current) use of aspirin: Secondary | ICD-10-CM | POA: Insufficient documentation

## 2017-12-07 DIAGNOSIS — K51511 Left sided colitis with rectal bleeding: Secondary | ICD-10-CM | POA: Diagnosis not present

## 2017-12-07 DIAGNOSIS — Z79899 Other long term (current) drug therapy: Secondary | ICD-10-CM | POA: Insufficient documentation

## 2017-12-07 DIAGNOSIS — Z7984 Long term (current) use of oral hypoglycemic drugs: Secondary | ICD-10-CM | POA: Insufficient documentation

## 2017-12-07 DIAGNOSIS — E119 Type 2 diabetes mellitus without complications: Secondary | ICD-10-CM | POA: Insufficient documentation

## 2017-12-07 DIAGNOSIS — K51919 Ulcerative colitis, unspecified with unspecified complications: Secondary | ICD-10-CM

## 2017-12-07 DIAGNOSIS — Z85820 Personal history of malignant melanoma of skin: Secondary | ICD-10-CM | POA: Insufficient documentation

## 2017-12-07 DIAGNOSIS — Z5112 Encounter for antineoplastic immunotherapy: Secondary | ICD-10-CM | POA: Insufficient documentation

## 2017-12-07 MED ORDER — VEDOLIZUMAB 300 MG IV SOLR
300.0000 mg | Freq: Once | INTRAVENOUS | Status: AC
  Administered 2017-12-07: 300 mg via INTRAVENOUS
  Filled 2017-12-07: qty 5

## 2017-12-07 MED ORDER — SODIUM CHLORIDE 0.9 % IV SOLN
Freq: Once | INTRAVENOUS | Status: AC
  Administered 2017-12-07: 10:00:00 via INTRAVENOUS
  Filled 2017-12-07: qty 1000

## 2017-12-07 NOTE — Assessment & Plan Note (Addendum)
#   Mod-Severe ulcerative colitis-  on Entivyo currently on every 8-week schedule.  Tolerating well.  Ulcerative colitis seems to be under control.  Encouraged to keep the appointment with his GI doctor.  # Peripheral Neuropathy/CTS? Defer to Dr.Shah/PCP.    #Infusion every 8 weeks; follow-up with me in 6 months labs.

## 2017-12-07 NOTE — Progress Notes (Signed)
[facility] OFFICE PROGRESS NOTE  Patient Care Team: Kirk Ruths, MD as PCP - General (Internal Medicine) [name], MD as Consulting Physician (Surgery)  Cancer Staging No matching staging information was found for the patient.    No history exists.   # Ulcerative Colitis [chronic]- infliximab [poor tol- Arthritis-stopped 7741]; Aug 2018- Entiviyo  # CAD [Dr.Fath; conservative measures]  INTERVAL HISTORY:  David [name] 72 y.o.  male pleasant patient Long-standing history of ulcerative colitis- moderate to severe followed by Dr. Jerrilyn Cairo.  In the interim he has been eval by cardiology-recommended conservative measures for his CAD.  Patient is currently on Entiviyo-tolerating well.  Patient denies any significant body aches or joint pains post infusion.  However he does complain of tingling and numbness of his hands which is exacerbated by movement of the wrist.  He has been evaluated by neurology/PCP for possible carpal tunnel.  Patient states his diarrhea/abdominal cramping from ulcerative colitis is overall improved. Patient denies any unusual weight loss or lumps or bumps.    REVIEW OF SYSTEMS:  A complete 10 point review of system is done which is negative except mentioned above/history of present illness.   PAST MEDICAL HISTORY :  Past Medical History:  Diagnosis Date  . Anemia   . CAD (coronary artery disease) 07/08/2011   Overview:  a. 07/2006: Anterior ST elevation MI. b. 07/2006: PCI with BMS to LAD and RCA. c. 09/2006: Non ST elevation. d. LVEF 30%.  Discussed ICD, not currently interested.   . 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 ([place]) 2/87/8676  . Dyslipidemia 07/30/2011   Overview:  High triglycerides  . GERD (gastroesophageal reflux disease) 07/08/2011  . History of Clostridium difficile colitis   . History of myocardial infarction   . Hyperlipidemia, unspecified 07/08/2011  . Hypothyroidism   . Hypothyroidism due  to acquired atrophy of thyroid 02/15/2015  . Type II diabetes mellitus, uncontrolled ([place]) 07/08/2011  . Vitamin D deficiency     PAST SURGICAL HISTORY :   Past Surgical History:  Procedure Laterality Date  . CARDIAC CATHETERIZATION    . CHOLECYSTECTOMY    . COLONOSCOPY  07/20/2006   Crohn's disease  . CORONARY ANGIOGRAPHY N/A 07/07/2017   Procedure: CORONARY ANGIOGRAPHY;  Surgeon: Teodoro Spray, MD;  Location: [facility];  Service: Cardiovascular;  Laterality: N/A;  . CORONARY STENT PLACEMENT    . FLEXIBLE SIGMOIDOSCOPY N/A 09/25/2016   Procedure: FLEXIBLE SIGMOIDOSCOPY;  Surgeon: [name], MD;  Location: [facility];  Service: Endoscopy;  Laterality: N/A;  . LEFT HEART CATH Left 07/07/2017   Procedure: Left Heart Cath;  Surgeon: Teodoro Spray, MD;  Location: [facility];  Service: Cardiovascular;  Laterality: Left;  Marland Kitchen MELANOMA REMOVED    . parotid gland removal      FAMILY HISTORY :   Family History  Problem Relation Age of Onset  . Heart disease Mother   . Heart attack Father   . Heart disease Sister   . Heart disease Brother     SOCIAL HISTORY:   Social History   Tobacco Use  . Smoking status: Never Smoker  . Smokeless tobacco: Never Used  Substance Use Topics  . Alcohol use: No  . Drug use: No    ALLERGIES:  is allergic to mesalamine.  MEDICATIONS:  Current Outpatient Medications  Medication Sig Dispense Refill  . ACCU-CHEK AVIVA PLUS test strip 1 each by Other route as needed for other.     Marland Kitchen ACCU-CHEK  SOFTCLIX LANCETS lancets USE 2 TIMES DAILY. USE AS INSTRUCTED.    Marland Kitchen aspirin EC 81 MG tablet Take 81 mg by mouth daily.     Marland Kitchen atorvastatin (LIPITOR) 40 MG tablet Take 40 mg by mouth every morning.     . calcium carbonate (TUMS EX) 750 MG chewable tablet Chew 1 tablet by mouth daily as needed for heartburn.    . carvedilol (COREG) 3.125 MG tablet Take 3.125 mg by mouth 2 (two) times daily with a meal.     . Cholecalciferol (VITAMIN D)  2000 units CAPS Take 1 capsule by mouth 2 (two) times daily.     . clopidogrel (PLAVIX) 75 MG tablet Take by mouth.    . gabapentin (NEURONTIN) 100 MG capsule Take 1 capsule by mouth 2 (two) times daily.   11  . gabapentin (NEURONTIN) 300 MG capsule Take 300 mg by mouth at bedtime.    Marland Kitchen glipiZIDE (GLUCOTROL) 10 MG tablet Take 10 mg by mouth 2 (two) times daily.     Marland Kitchen levothyroxine (SYNTHROID, LEVOTHROID) 50 MCG tablet Take 50 mcg by mouth daily before breakfast.     . Multiple Vitamins-Minerals (MULTIVITAMIN ADULT PO) Take 1 tablet by mouth daily.     Marland Kitchen nystatin cream (MYCOSTATIN) Apply topically.    . Omega-3 Fatty Acids (FISH OIL PO) Take 1 capsule by mouth 2 (two) times daily.     . Probiotic Product (PROBIOTIC DAILY PO) Take 1 capsule by mouth daily.     Marland Kitchen pyridOXINE (VITAMIN B-6) 100 MG tablet Take 100 mg by mouth daily.    . sacubitril-valsartan (ENTRESTO) 24-26 MG Take 1 tablet by mouth 2 (two) times daily.     . vedolizumab (ENTYVIO) 300 MG injection Inject into the vein. Every 4 weeks    . vitamin B-12 (CYANOCOBALAMIN) 1000 MCG tablet Take 1,000 mcg by mouth daily.    . pantoprazole (PROTONIX) 40 MG tablet Take 1 tablet by mouth daily.     Current Facility-Administered Medications  Medication Dose Route Frequency Provider Last Rate Last Dose  . inFLIXimab (REMICADE) 5 mg/kg = 400 mg in sodium chloride 0.9 % 250 mL infusion  5 mg/kg (Order-Specific) Intravenous Q21 days [name], MD       Facility-Administered Medications Ordered in Other Visits  Medication Dose Route Frequency Provider Last Rate Last Dose  . 0.9 %  sodium chloride infusion   Intravenous Once Charlaine Dalton R, MD      . vedolizumab (ENTYVIO) 300 mg in sodium chloride 0.9 % 250 mL infusion  300 mg Intravenous Once Cammie Sickle, MD        PHYSICAL EXAMINATION: ECOG PERFORMANCE STATUS: 0 - Asymptomatic  BP (!) 146/79 (BP Location: Left Arm, Patient Position: Sitting)   Pulse 69   Temp (!) 97 F  (36.1 C) (Tympanic)   Resp 20   Ht 6' (1.829 m)   Wt 191 lb (86.6 kg)   BMI 25.90 kg/m   Filed Weights   12/07/17 0938  Weight: 191 lb (86.6 kg)    GENERAL: Well-nourished well-developed; Alert, no distress and comfortable.   Alone.  EYES: no pallor or icterus OROPHARYNX: no thrush or ulceration; good dentition  NECK: supple, no masses felt LYMPH:  no palpable lymphadenopathy in the cervical, axillary or inguinal regions LUNGS: clear to auscultation and  No wheeze or crackles HEART/CVS: regular rate & rhythm and no murmurs; No lower extremity edema ABDOMEN:abdomen soft, non-tender and normal bowel sounds Musculoskeletal:no cyanosis of digits and no  clubbing  PSYCH: alert & oriented x 3 with fluent speech NEURO: no focal motor/sensory deficits SKIN:  no rashes or significant lesions  LABORATORY DATA:  I have reviewed the data as listed    Component Value Date/Time   NA 139 09/24/2017 0957   K 3.9 09/24/2017 0957   CL 106 09/24/2017 0957   CO2 25 09/24/2017 0957   GLUCOSE 89 09/24/2017 0957   BUN 8 09/24/2017 0957   CREATININE 1.00 09/24/2017 0957   CALCIUM 8.9 09/24/2017 0957   PROT 7.3 09/24/2017 0957   ALBUMIN 3.7 09/24/2017 0957   AST 25 09/24/2017 0957   ALT 18 09/24/2017 0957   ALKPHOS 71 09/24/2017 0957   BILITOT 0.8 09/24/2017 0957   GFRNONAA >60 09/24/2017 0957   GFRAA >60 09/24/2017 0957    No results found for: SPEP, UPEP  Lab Results  Component Value Date   WBC 6.0 09/24/2017   NEUTROABS 3.2 09/24/2017   HGB 13.1 09/24/2017   HCT 37.9 (L) 09/24/2017   MCV 88.8 09/24/2017   PLT 240 09/24/2017      Chemistry      Component Value Date/Time   NA 139 09/24/2017 0957   K 3.9 09/24/2017 0957   CL 106 09/24/2017 0957   CO2 25 09/24/2017 0957   BUN 8 09/24/2017 0957   CREATININE 1.00 09/24/2017 0957      Component Value Date/Time   CALCIUM 8.9 09/24/2017 0957   ALKPHOS 71 09/24/2017 0957   AST 25 09/24/2017 0957   ALT 18 09/24/2017 0957    BILITOT 0.8 09/24/2017 0957       RADIOGRAPHIC STUDIES: I have personally reviewed the radiological images as listed and agreed with the findings in the report. No results found.   ASSESSMENT & PLAN:  Left sided ulcerative colitis with rectal bleeding ([place]) # Mod-Severe ulcerative colitis-  on Entivyo currently on every 8-week schedule.  Tolerating well.  Ulcerative colitis seems to be under control.  Encouraged to keep the appointment with his GI doctor.  # Peripheral Neuropathy/CTS? Defer to Dr.Shah/PCP.    #Infusion every 8 weeks; follow-up with me in 6 months labs.    Orders Placed This Encounter  Procedures  . CBC with Differential    Standing Status:   Future    Standing Expiration Date:   12/07/2018  . Basic metabolic panel    Standing Status:   Future    Standing Expiration Date:   12/07/2018   All questions were answered. The patient knows to call the clinic with any problems, questions or concerns.      Cammie Sickle, MD 12/07/2017 10:08 AM

## 2018-01-13 IMAGING — CT CT ABD-PELV W/ CM
2 of 5 series · 15 of 46 positions shown, 17 images · IV contrast (APPLIED)
Comparison: 10/07/2011

CLINICAL DATA: Left lower quadrant pain with recent bloody
diarrhea, history of ulcerative colitis

EXAM:
CT ABDOMEN AND PELVIS WITH CONTRAST
TECHNIQUE: Multidetector CT imaging of the abdomen and pelvis was performed
using the standard protocol following bolus administration of
intravenous contrast.
CONTRAST:  100mL 9CE4AD-2FF IOPAMIDOL (9CE4AD-2FF) INJECTION 61%

[Series 2: routine abd/pel with · axial · 0.78mm/px · z∈[-456,-16]mm · 12 of 104 slices shown, 14 images]
[im 8/104  soft-tissue]
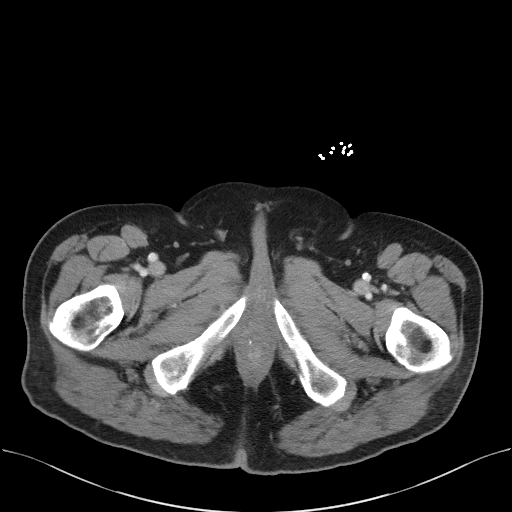
[im 8/104  bone]
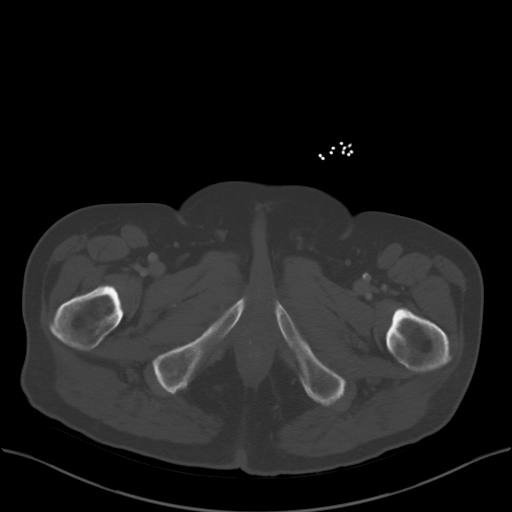
[im 15/104  soft-tissue]
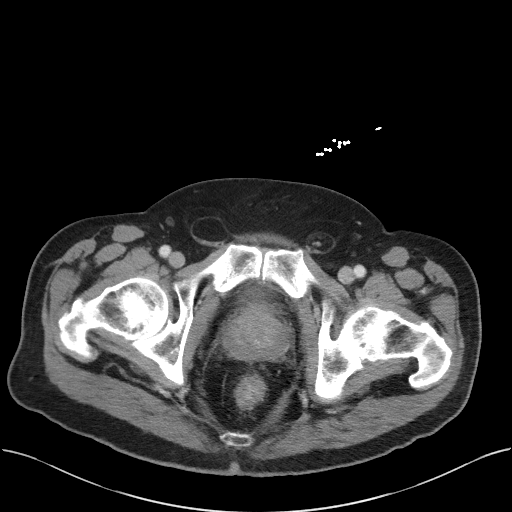
[im 23/104  soft-tissue]
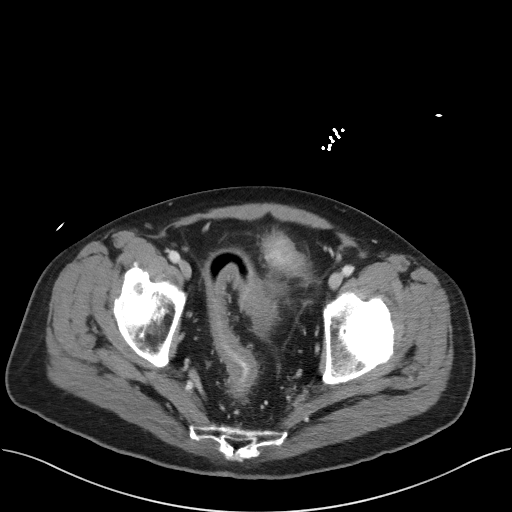
[im 30/104  soft-tissue]
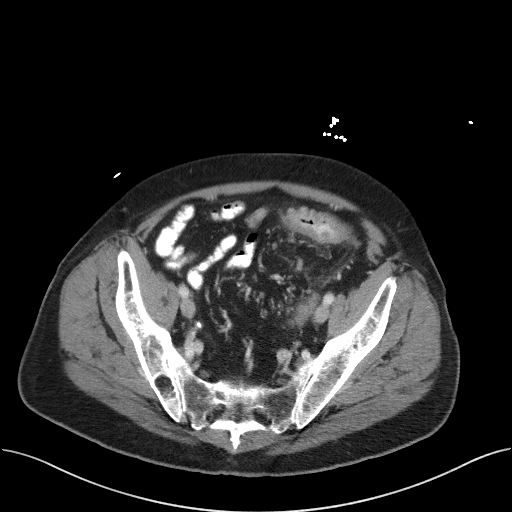
[im 37/104  soft-tissue]
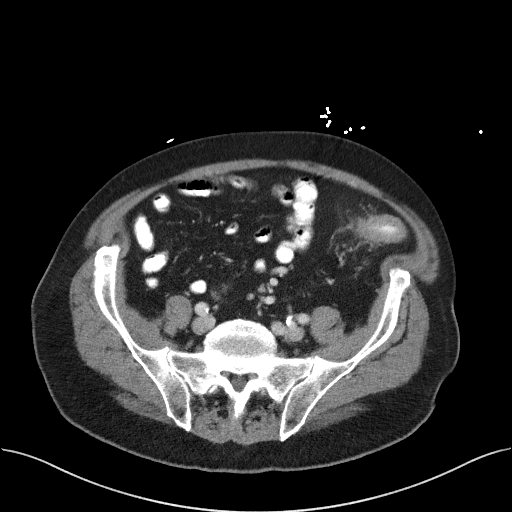
[im 45/104  soft-tissue]
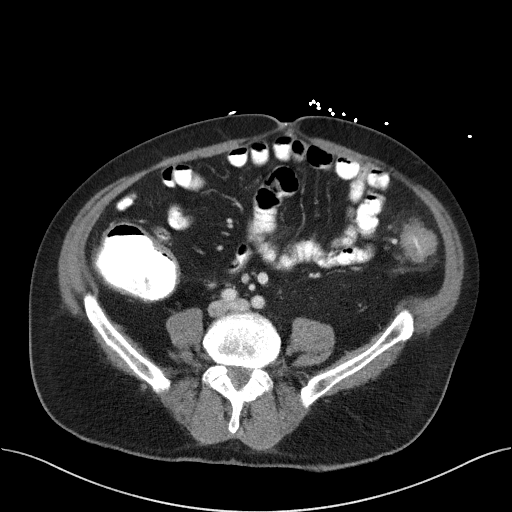
[im 59/104  soft-tissue]
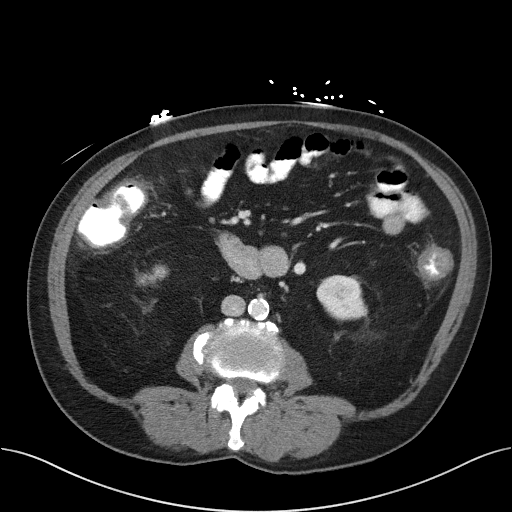
[im 67/104  soft-tissue]
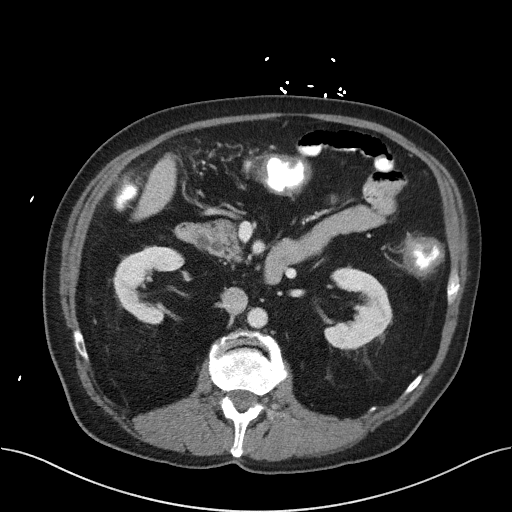
[im 74/104  soft-tissue]
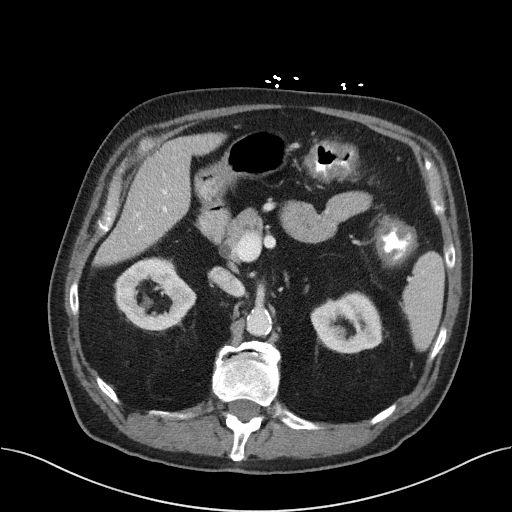
[im 74/104  bone]
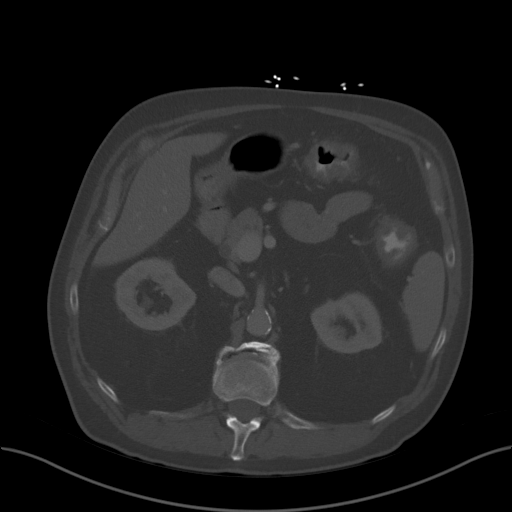
[im 81/104  soft-tissue]
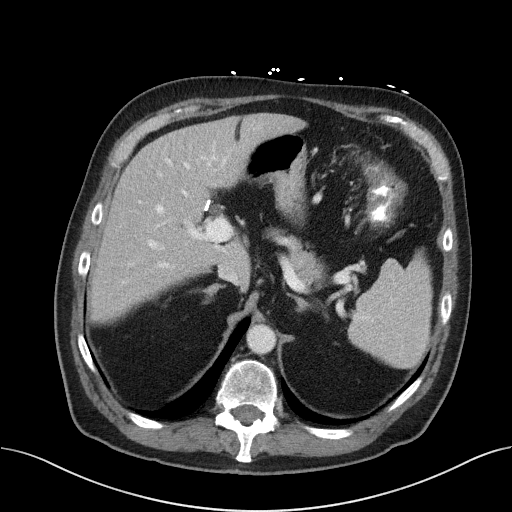
[im 89/104  soft-tissue]
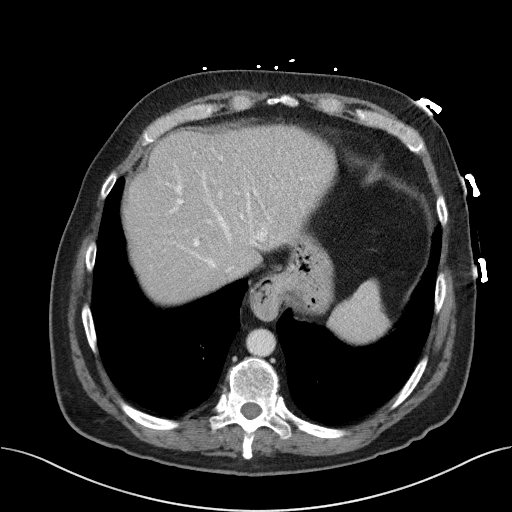
[im 96/104  soft-tissue]
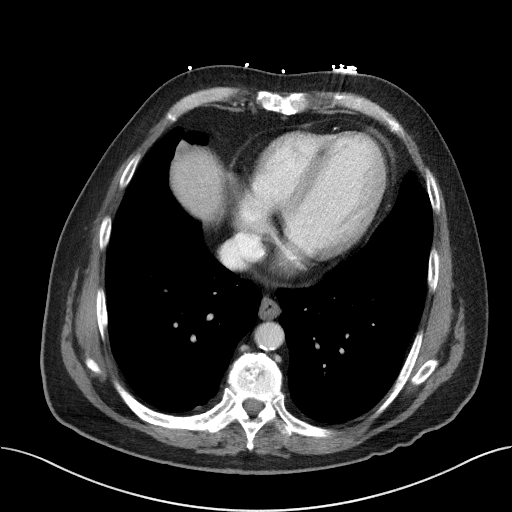

[Series 5: coronal st · coronal · 0.75mm/px · 3 of 102 slices shown]
[im 34/102  soft-tissue]
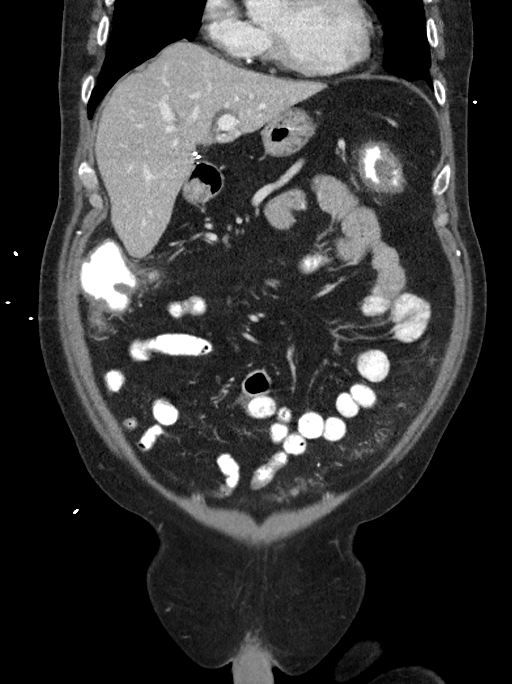
[im 45/102  soft-tissue]
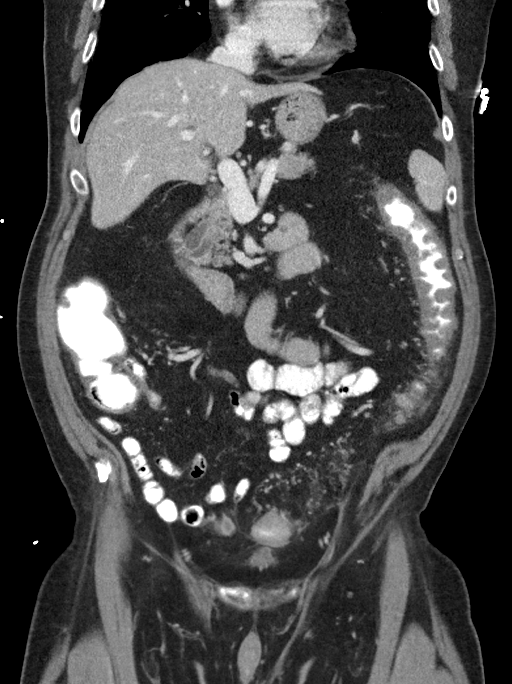
[im 57/102  soft-tissue]
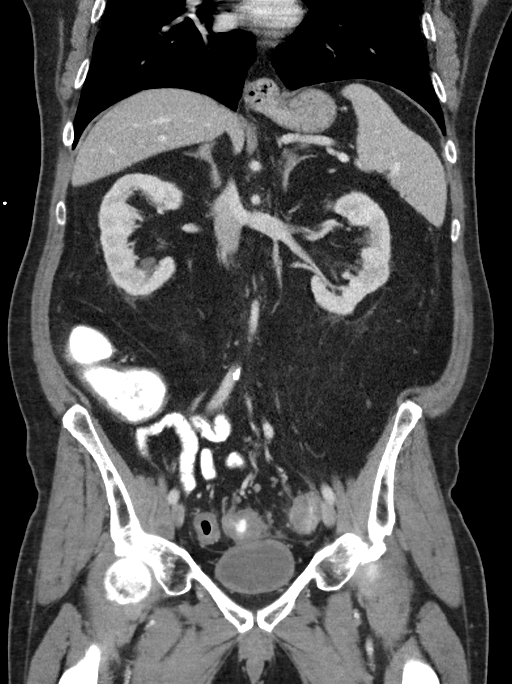

[15 of 46 positions shown; findings below may reference images not displayed]

FINDINGS: Lower chest: Lung bases are well visualized. 7 mm stable nodule is
noted in the right lower lobe laterally. Minimal scarring is noted
in the right middle lobe stable from the prior exam. These changes
are benign given their chronicity.

Hepatobiliary: The liver is diffusely decreased in attenuation
consistent with fatty infiltration. The gallbladder has been
surgically removed.

Pancreas: Unremarkable. No pancreatic ductal dilatation or
surrounding inflammatory changes.

Spleen: Normal in size without focal abnormality.

Adrenals/Urinary Tract: The adrenal glands are within normal limits.
The kidneys demonstrate a normal enhancement pattern. No abnormal
mass is identified. Delayed images demonstrate normal excretion.
Bladder is well distended.

Stomach/Bowel: Stomach is within normal limits. Appendix appears
normal. Small bowel is within normal limits. There is diffuse wall
thickening of the colon from approximately the proximal transverse
colon extending distally to the rectum. Diverticular change is seen.
Only mild very colonic inflammatory change is seen. Changes may be
related to the patient's underlying ulcerative colitis. The
possibility of an infectious etiology would deserve consideration as
well. Scattered associated mesenteric lymph nodes are noted.

Vascular/Lymphatic: Scattered reactive mesenteric lymph nodes. No
other significant lymphadenopathy is noted. Aortic atherosclerotic
change is seen without aneurysmal dilatation.

Reproductive: Prostate is unremarkable.

Other: No abdominal wall hernia or abnormality. No abdominopelvic
ascites.

Musculoskeletal: Degenerative change of the lumbar spine is noted.
IMPRESSION: Diffuse colonic wall thickening from the proximal transverse colon
to the rectum. Differential includes patient's known ulcerative
colitis are possible infectious or ischemic etiologies.

Chronic changes as described above.

## 2018-01-14 IMAGING — CR DG ABDOMEN ACUTE W/ 1V CHEST
1 series · 3 of 3 positions shown · non-contrast
Comparison: 09/28/2016

CLINICAL DATA: Increasing abdominal pain today.

EXAM:
DG ABDOMEN ACUTE W/ 1V CHEST

[Series 1: dg abd acute w/chest · 0.14mm/px · 3 of 3 slices shown]
[im 1/3]
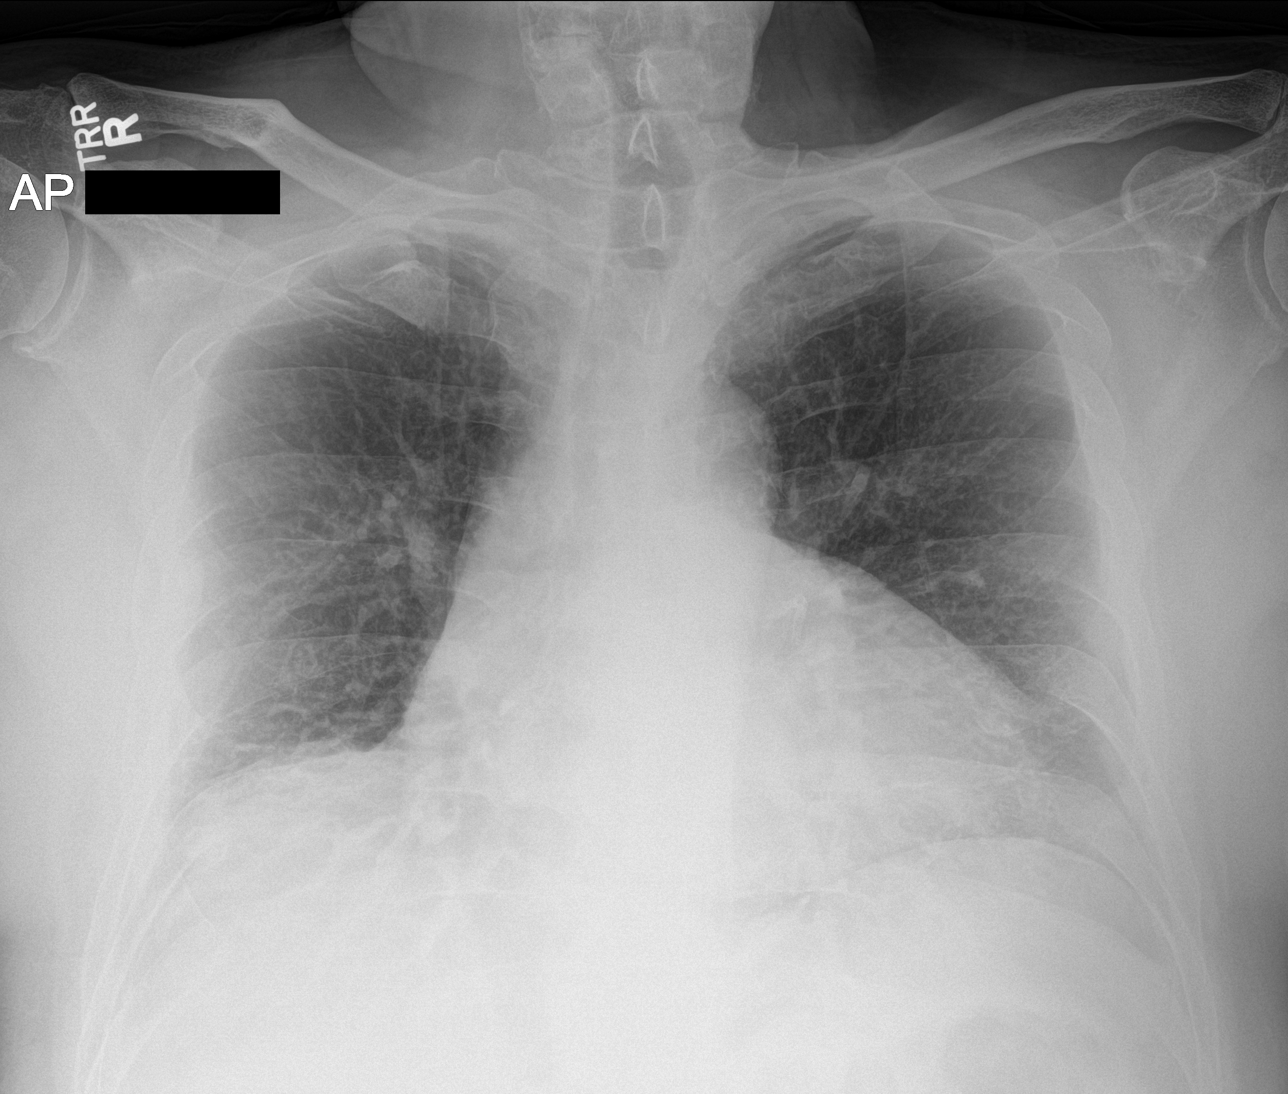
[im 2/3]
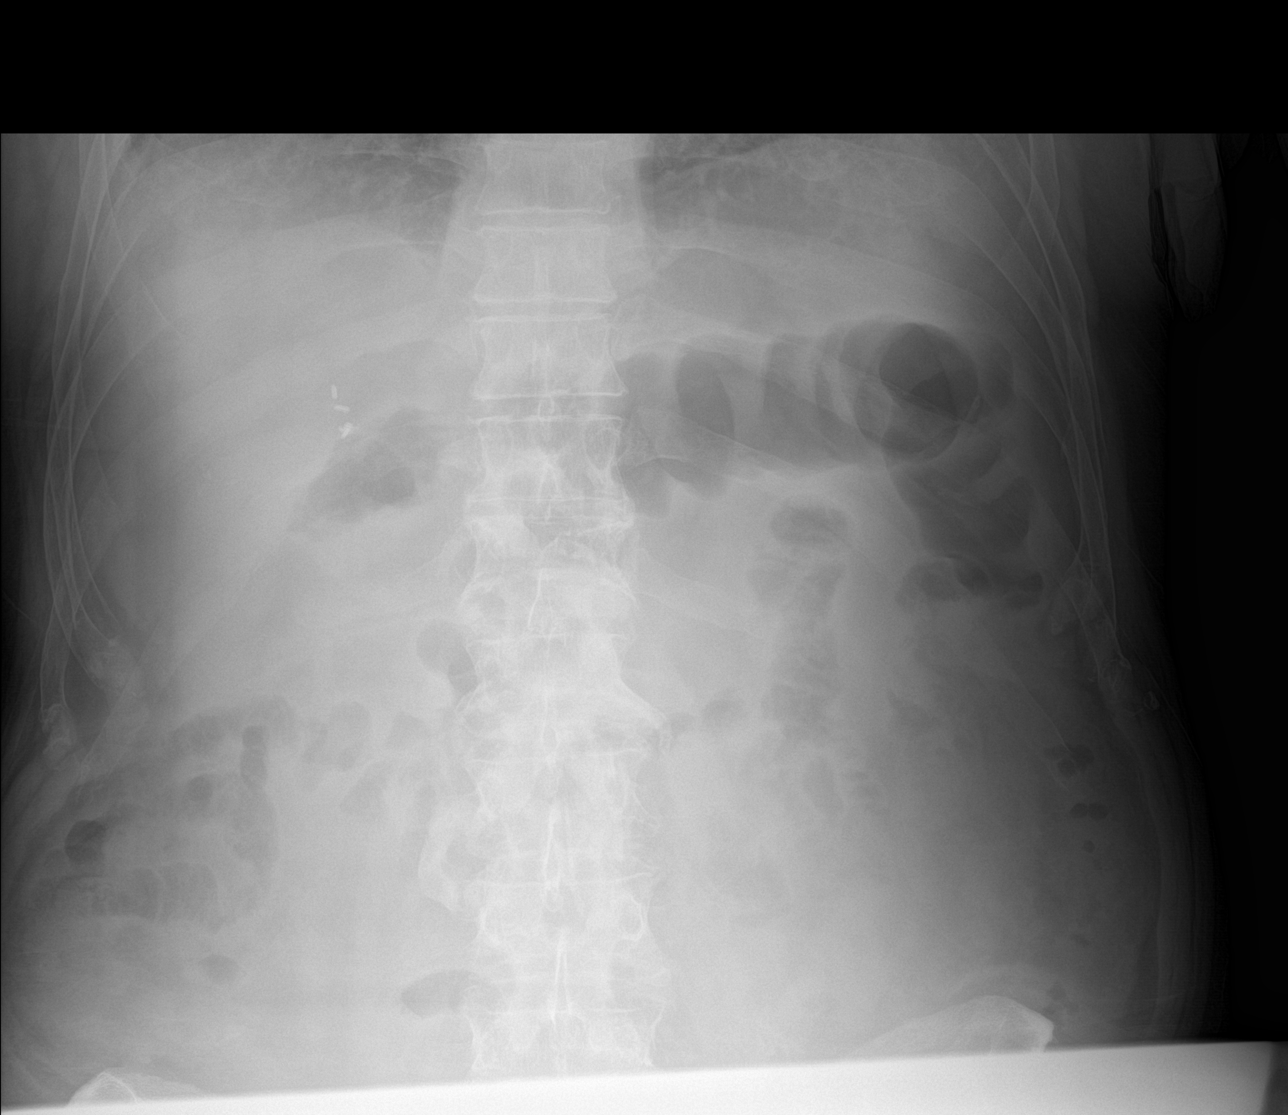
[im 3/3]
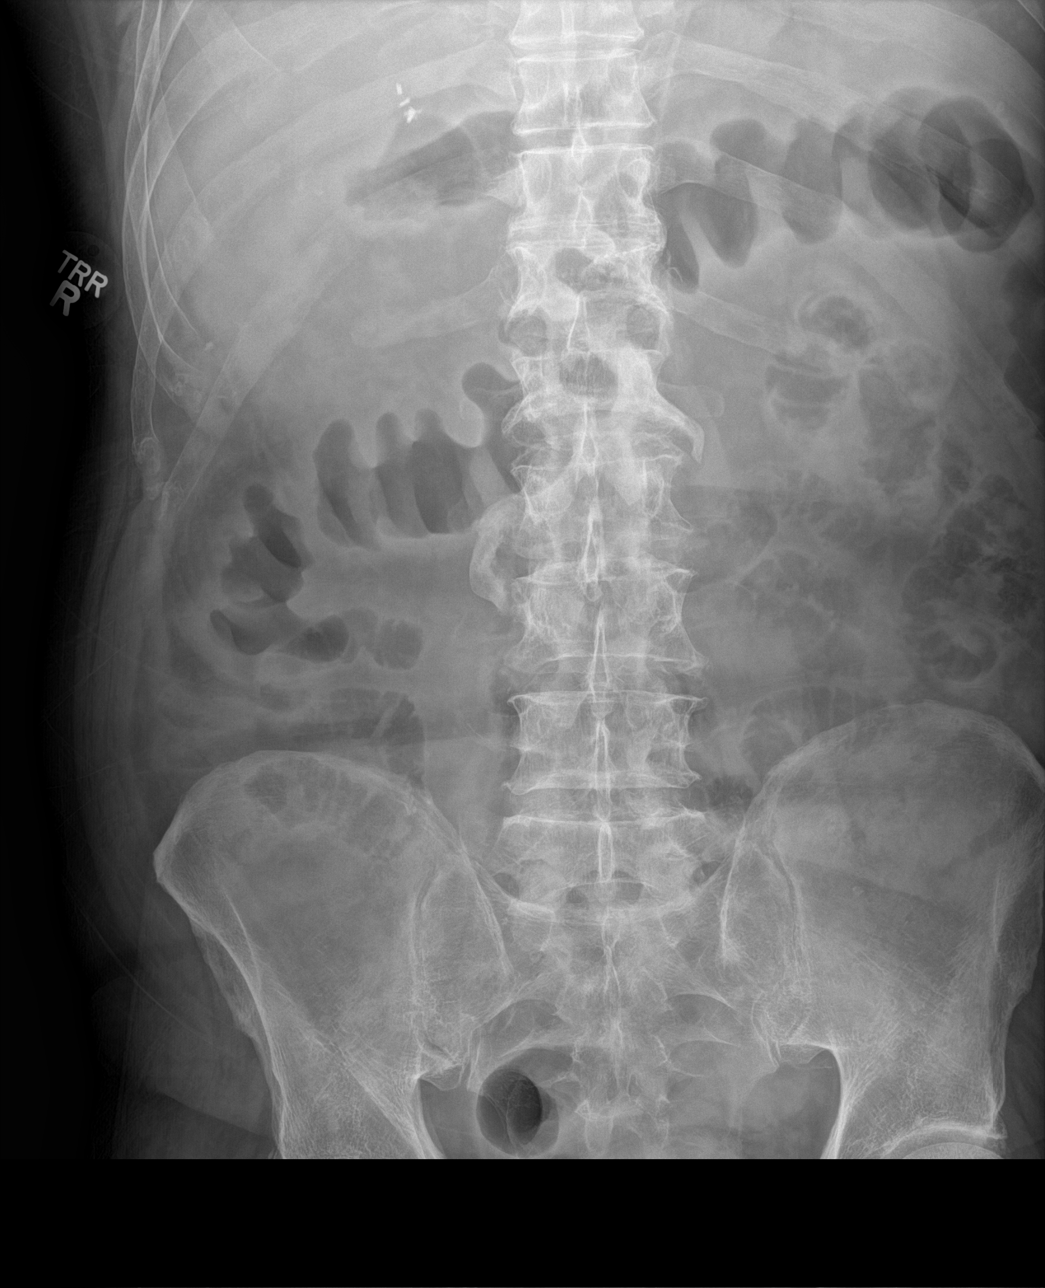

[3 of 3 positions shown; findings below may reference images not displayed]

FINDINGS: Colonic palm prints Oxendine is present, typical of mural edema
associated with colitis. No evidence of bowel obstruction. No
extraluminal air. The upright view of the chest is negative for
acute cardiopulmonary abnormality.
IMPRESSION: Findings consistent with colitis. No evidence of obstruction or
perforation.

## 2018-01-24 ENCOUNTER — Ambulatory Visit: Payer: Medicare Other | Admitting: Gastroenterology

## 2018-01-24 ENCOUNTER — Encounter: Payer: Self-pay | Admitting: Gastroenterology

## 2018-01-24 VITALS — BP 100/66 | HR 74 | Ht 72.0 in | Wt 192.4 lb

## 2018-01-24 DIAGNOSIS — K51919 Ulcerative colitis, unspecified with unspecified complications: Secondary | ICD-10-CM

## 2018-01-24 NOTE — Progress Notes (Signed)
[name] MD, MRCP(U.K) [address]  [place]  [place], [place] [address]  Main: [phone]  Fax: [phone]   Primary Care Physician: Kirk Ruths, MD  Primary Gastroenterologist:  Dr. [name]   Chief Complaint  Patient presents with  . Follow-up    HPI: David [name] is a 71 y.o. male   Summary of history :  Hehas had ulcerative colitis from 2007 . 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 Remicaid d/c in 2009 due to the malignant skin lesions. Since 2009 Not been on any medications. He was being followed by [facility] till 2015 when his doctor retired. Hospitalized in 2011 for flare of the colitis. His symptoms returned in early December 2017  when I took over his care . I performed a sigmoidoscopy on 09/25/16 and I noted moderate to severe left sided colitis. Biopsies Confirmed marked active colitis negative for HSV and CMV,Commenced him on mesalamine which he did not tolerate and was admitted to the hospital with symptoms. 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 Restarted on prednisone after a few days of antibiotics. Likely he had the clot secondary to colitis and subsequently the clot may have been infected. We wanted to transfer him to a tertiary center but he  refused . He was seen at [facility] Dr Daphane Shepherd who specializes in IBD for a second opinion as he has had prior skin cancer with regards to options. She saw him on 12/01/16.Commenced on remicaid 01/05/17.3 days after the first infusion had no blood or diarrhea and firming up of stools.Commenced on Entyvio in 05/2017 .    Interval history   09/2017 -01/24/18   Not immune to Hep A/B , Tb test negative,CRP <0.8, vitamin D normal  in 09/2017 Weight been stable. 1 bowel movement a day , no blood . No abdominal pain.   Low energy levels for a year .  Did not have his hepatitis Vaccine.   Presently on Plavix and asprin    Current Outpatient Medications  Medication Sig Dispense Refill  . ACCU-CHEK AVIVA PLUS test strip 1 each by Other route as needed for other.     Marland Kitchen ACCU-CHEK SOFTCLIX LANCETS lancets USE 2 TIMES DAILY. USE AS INSTRUCTED.    Marland Kitchen aspirin EC 81 MG tablet Take 81 mg by mouth daily.     Marland Kitchen atorvastatin (LIPITOR) 40 MG tablet Take 40 mg by mouth every morning.     . calcium carbonate (TUMS EX) 750 MG chewable tablet Chew 1 tablet by mouth daily as needed for heartburn.    . carvedilol (COREG) 3.125 MG tablet Take 3.125 mg by mouth 2 (two) times daily with a meal.     . Cholecalciferol (VITAMIN D) 2000 units CAPS Take 1 capsule by mouth 2 (two) times daily.     . clopidogrel (PLAVIX)  75 MG tablet Take by mouth.    . gabapentin (NEURONTIN) 100 MG capsule Take 1 capsule by mouth 2 (two) times daily.   11  . gabapentin (NEURONTIN) 300 MG capsule Take 300 mg by mouth at bedtime.    Marland Kitchen glipiZIDE (GLUCOTROL) 10 MG tablet Take 10 mg by mouth 2 (two) times daily.     Marland Kitchen levothyroxine (SYNTHROID, LEVOTHROID) 50 MCG tablet Take 50 mcg by mouth daily before breakfast.     . Multiple Vitamins-Minerals (MULTIVITAMIN ADULT PO) Take 1 tablet by mouth daily.     Marland Kitchen nystatin cream (MYCOSTATIN) Apply topically.    . Omega-3 Fatty Acids (FISH OIL PO) Take 1 capsule by mouth 2 (two) times daily.     . pantoprazole  (PROTONIX) 40 MG tablet Take 1 tablet by mouth daily.    . Probiotic Product (PROBIOTIC DAILY PO) Take 1 capsule by mouth daily.     Marland Kitchen pyridOXINE (VITAMIN B-6) 100 MG tablet Take 100 mg by mouth daily.    . sacubitril-valsartan (ENTRESTO) 24-26 MG Take 1 tablet by mouth 2 (two) times daily.     . vedolizumab (ENTYVIO) 300 MG injection Inject into the vein. Every 4 weeks    . vitamin B-12 (CYANOCOBALAMIN) 1000 MCG tablet Take 1,000 mcg by mouth daily.     Current Facility-Administered Medications  Medication Dose Route Frequency Provider Last Rate Last Dose  . inFLIXimab (REMICADE) 5 mg/kg = 400 mg in sodium chloride 0.9 % 250 mL infusion  5 mg/kg (Order-Specific) Intravenous Q21 days [name], MD        Allergies as of 01/24/2018 - Review Complete 12/07/2017  Allergen Reaction Noted  . Mesalamine Nausea Only and Nausea And Vomiting 12/01/2016    ROS:  General: Negative for anorexia, weight loss, fever, chills, fatigue, weakness. ENT: Negative for hoarseness, difficulty swallowing , nasal congestion. CV: Negative for chest pain, angina, palpitations, dyspnea on exertion, peripheral edema.  Respiratory: Negative for dyspnea at rest, dyspnea on exertion, cough, sputum, wheezing.  GI: See history of present illness. GU:  Negative for dysuria, hematuria, urinary incontinence, urinary frequency, nocturnal urination.  Endo: Negative for unusual weight change.    Physical Examination:   BP 100/66 (BP Location: Left Arm, Patient Position: Sitting, Cuff Size: Normal)   Pulse 74   Ht 6' (1.829 m)   Wt 192 lb 6.4 oz (87.3 kg)   BMI 26.09 kg/m   General: Well-nourished, well-developed in no acute distress.  Eyes: No icterus. Conjunctivae pink. Mouth: Oropharyngeal mucosa moist and pink , no lesions erythema or exudate. Lungs: Clear to auscultation bilaterally. Non-labored. Heart: Regular rate and rhythm, no murmurs rubs or gallops.  Abdomen: Bowel sounds are normal, nontender,  nondistended, no hepatosplenomegaly or masses, no abdominal bruits or hernia , no rebound or guarding.   Extremities: No lower extremity edema. No clubbing or deformities. Neuro: Alert and oriented x 3.  Grossly intact. Skin: Warm and dry, no jaundice.   Psych: Alert and cooperative, normal mood and affect.   Imaging Studies: No results found.  Assessment and Plan:   LYAN [name] is a 72 y.o. 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 He was seen by Dr Daphane Shepherd at [facility] who specializes in IBD for a  second opinion and we discussed his case after. Options werecolectomy which he was notkeen, use of Vedolizumab which has least possibility of skin cancer . Commenced on infliximab on 01/05/17 . Had severe reactions after the infusions and decided to change to [facility] in 05/2017 . Presently has no symptoms , doing better, biochemically in remission too.He is over due for a colonoscopy for dysplasia surveillance. I stressed the importance of proceeding with a colonoscopy . He will think about it , aware of the risk of colon cancer going undiagnosed which I am very concerned about. Stressed need for health maintanance.    Plan  1. Annual skin exam  2. Hep A/B vaccine 3. DEXA bone scan  4. CBC,CMP,CRP   Dr [name]  MD,MRCP [facility]) Follow up in 6-8 weeks

## 2018-01-24 NOTE — Progress Notes (Signed)
David [name] states that he has increasing weakness and lethargy. Also noted back pain. He has soft stools and some diarrhea but states colitis seems to be under control.  Cardiologists told him he has a blocked artery that is too dangerous for surgery. States [name] knocks him down for 3 - 4 days but its not as bad as the Remicade.

## 2018-02-01 ENCOUNTER — Inpatient Hospital Stay: Payer: Medicare Other | Attending: Internal Medicine

## 2018-02-01 VITALS — BP 134/77 | HR 57 | Temp 96.2°F | Resp 20

## 2018-02-01 DIAGNOSIS — K51919 Ulcerative colitis, unspecified with unspecified complications: Secondary | ICD-10-CM | POA: Insufficient documentation

## 2018-02-01 MED ORDER — SODIUM CHLORIDE 0.9 % IV SOLN
Freq: Once | INTRAVENOUS | Status: AC
Start: 2018-02-01 — End: 2018-02-01
  Administered 2018-02-01: 10:00:00 via INTRAVENOUS
  Filled 2018-02-01: qty 1000

## 2018-02-01 MED ORDER — VEDOLIZUMAB 300 MG IV SOLR
300.0000 mg | Freq: Once | INTRAVENOUS | Status: AC
  Administered 2018-02-01: 300 mg via INTRAVENOUS
  Filled 2018-02-01 (×2): qty 5

## 2018-02-01 NOTE — Patient Instructions (Signed)
Vedolizumab injection solution What is this medicine? VEDOLIZUMAB (Ve doe LIZ you mab) is used to treat ulcerative colitis and Crohn's disease in adult patients. This medicine may be used for other purposes; ask your health care provider or pharmacist if you have questions. COMMON BRAND NAME(S): Entyvio What should I tell my health care provider before I take this medicine? They need to know if you have any of these conditions: -diabetes -hepatitis B or history of hepatitis B infection -HIV or AIDS -immune system problems -infection or history of infections -liver disease -recently received or scheduled to receive a vaccine -scheduled to have surgery -tuberculosis, a positive skin test for tuberculosis or have recently been in close contact with someone who has tuberculosis - an unusual or allergic reaction to vedolizumab, other medicines, foods, dyes, or preservatives -pregnant or trying to get pregnant -breast-feeding How should I use this medicine? This medicine is for infusion into a vein. It is given by a health care professional in a hospital or clinic setting. A special MedGuide will be given to you by the pharmacist with each prescription and refill. Be sure to read this information carefully each time. Talk to your pediatrician regarding the use of this medicine in children. This medicine is not approved for use in children. Overdosage: If you think you have taken too much of this medicine contact a poison control center or emergency room at once. NOTE: This medicine is only for you. Do not share this medicine with others. What if I miss a dose? It is important not to miss your dose. Call your doctor or health care professional if you are unable to keep an appointment. What may interact with this medicine? -steroid medicines like prednisone or cortisone -TNF-alpha inhibitors like natalizumab, adalimumab, and infliximab -vaccines This list may not describe all possible  interactions. Give your health care provider a list of all the medicines, herbs, non-prescription drugs, or dietary supplements you use. Also tell them if you smoke, drink alcohol, or use illegal drugs. Some items may interact with your medicine. What should I watch for while using this medicine? Your condition will be monitored carefully while you are receiving this medicine. Visit your doctor for regular check ups. Tell your doctor or healthcare professional if your symptoms do not start to get better or if they get worse. Stay away from people who are sick. Call your doctor or health care professional for advice if you get a fever, chills or sore throat, or other symptoms of a cold or flu. Do not treat yourself. In some patients, this medicine may cause a serious brain infection that may cause death. If you have any problems seeing, thinking, speaking, walking, or standing, tell your doctor right away. If you cannot reach your doctor, get urgent medical care. What side effects may I notice from receiving this medicine? Side effects that you should report to your doctor or health care professional as soon as possible: -allergic reactions like skin rash, itching or hives, swelling of the face, lips, or tongue -breathing problems -changes in vision -chest pain -dark urine -depression, feelings of sadness -dizziness -general ill feeling or flu-like symptoms -irregular, missed, or painful menstrual periods -light-colored stools -loss of appetite, nausea -muscle weakness -problems with balance, talking, or walking -right upper belly pain -unusually weak or tired -yellowing of the eyes or skin Side effects that usually do not require medical attention (report to your doctor or health care professional if they continue or are bothersome): -aches, pains -headache -  stomach upset -tiredness This list may not describe all possible side effects. Call your doctor for medical advice about side  effects. You may report side effects to FDA at 1-800-FDA-1088. Where should I keep my medicine? This drug is given in a hospital or clinic and will not be stored at home. NOTE: This sheet is a summary. It may not cover all possible information. If you have questions about this medicine, talk to your doctor, pharmacist, or health care provider.  2018 Elsevier/Gold Standard (2015-10-31 08:36:51)

## 2018-02-10 IMAGING — CR DG CHEST 2V
2 series · 2 of 2 positions shown · non-contrast
Comparison: Acute abdominal series done 09/29/2016.

CLINICAL DATA: Cough for 2 weeks with fever today. History of
ulcerative colitis.

EXAM:
CHEST  2 VIEW

[chest lat]
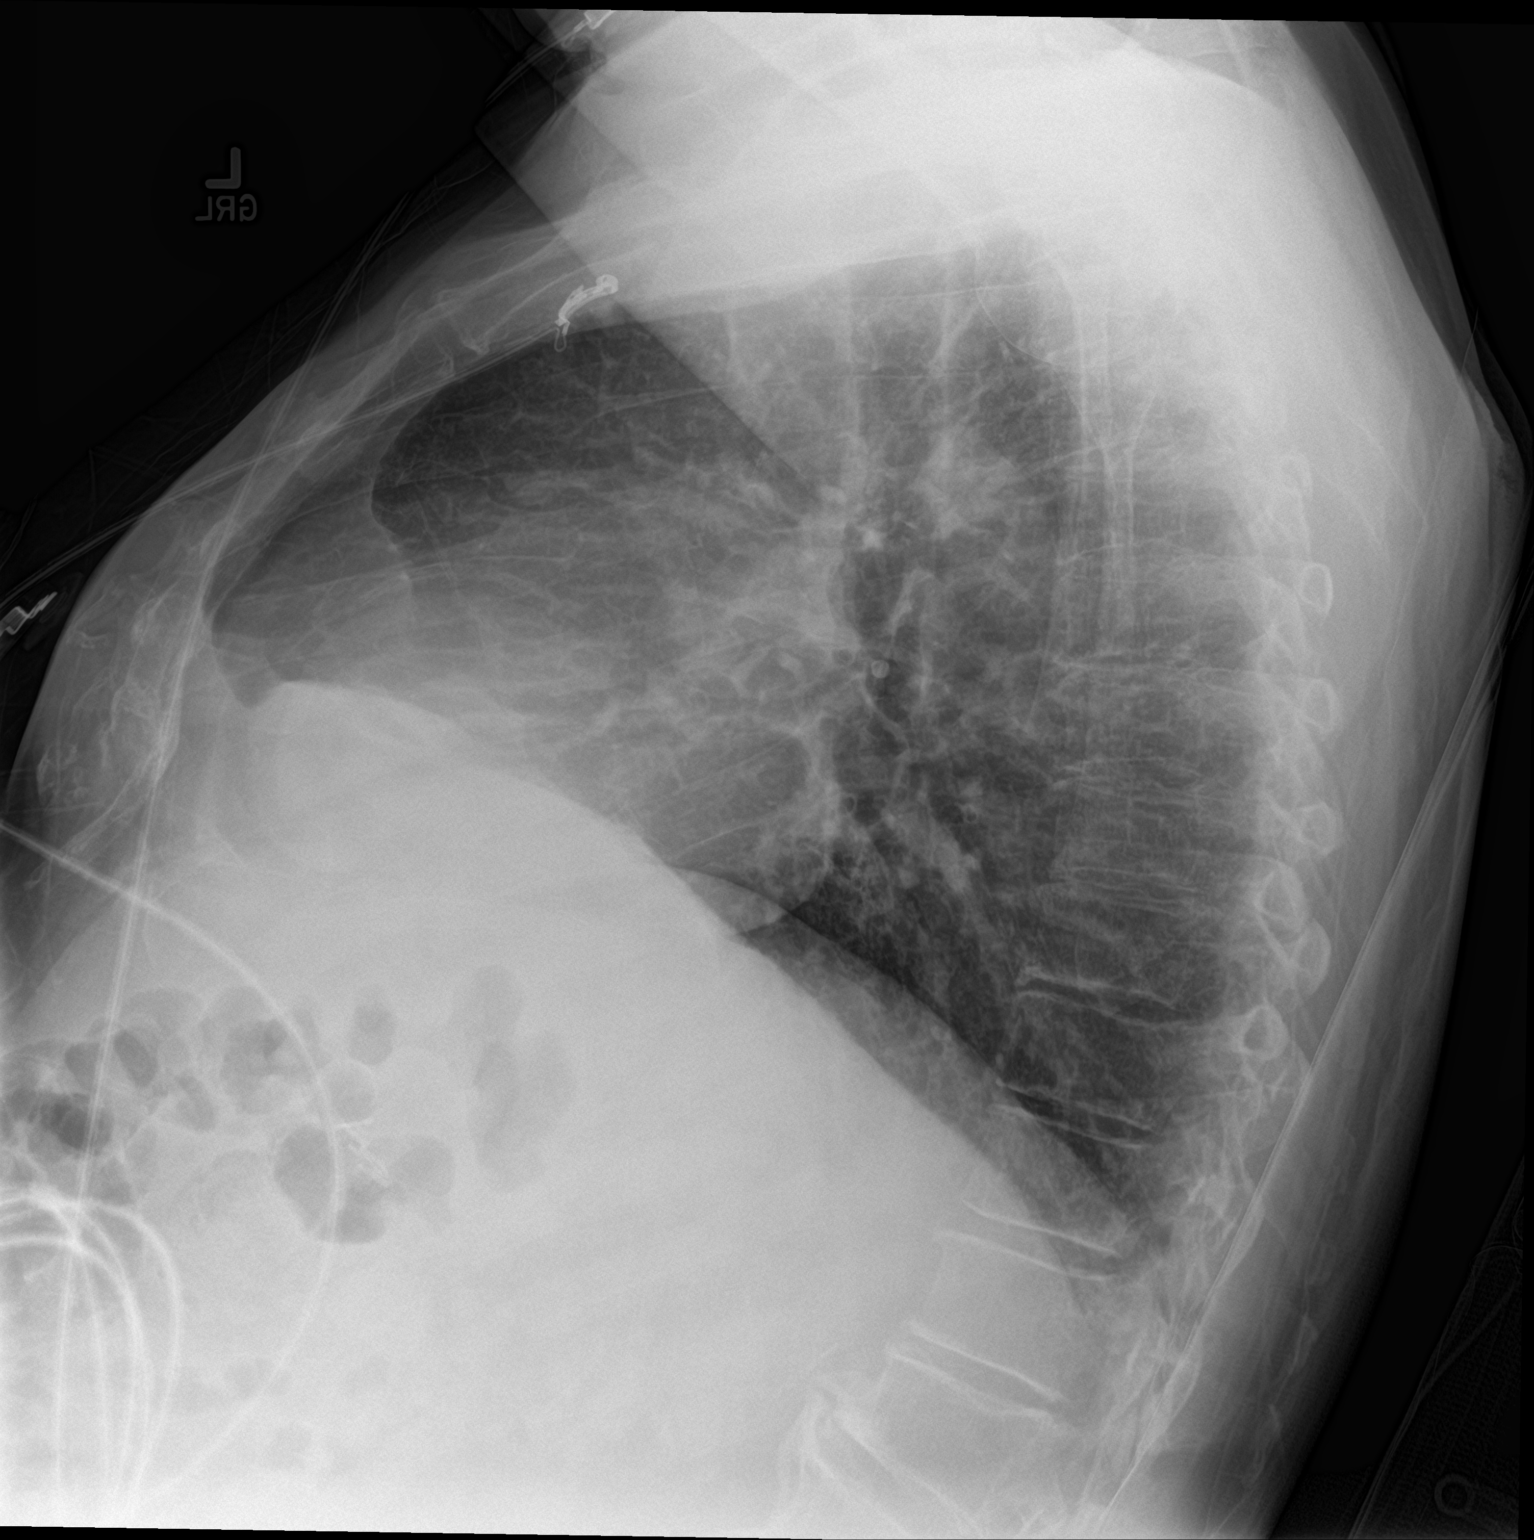

[chest ap]
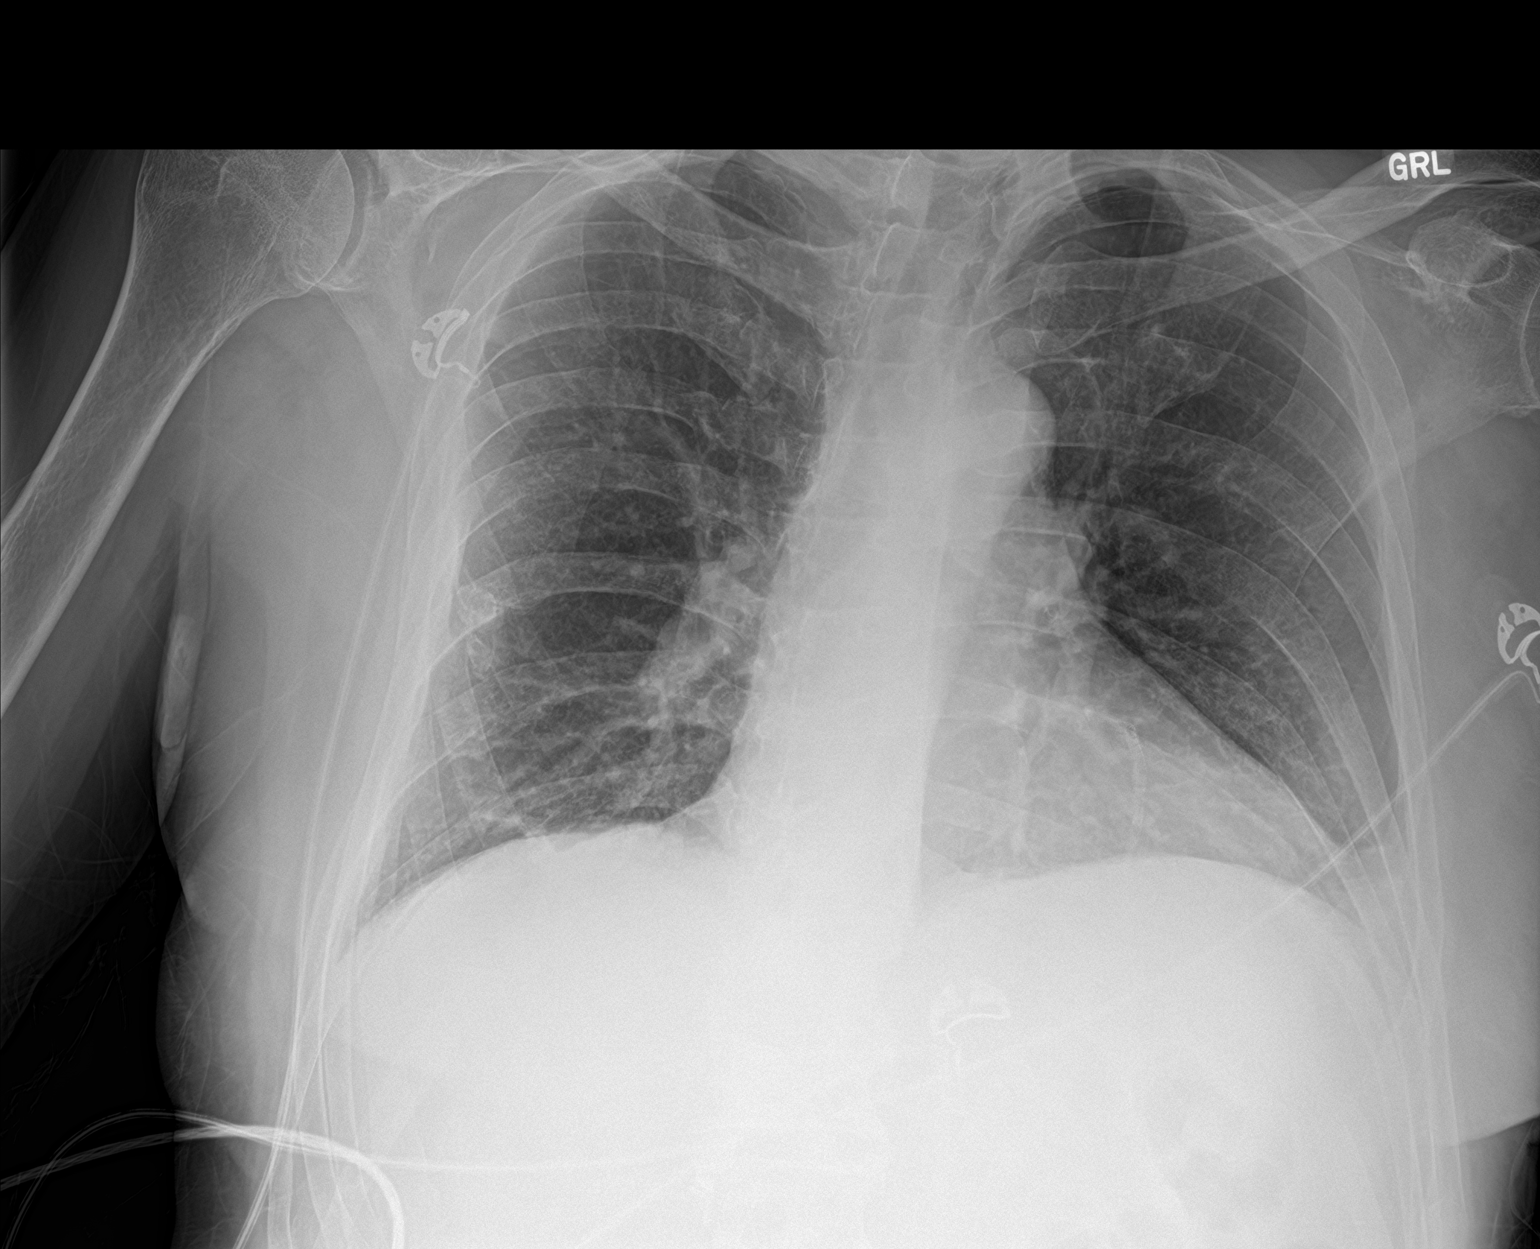

[2 of 2 positions shown; findings below may reference images not displayed]

FINDINGS: The heart size and mediastinal contours are stable. There is a
probable coronary artery stent. The lungs are clear. There is no
pleural effusion or pneumothorax. Probable old rib fracture
laterally on the right. No acute osseous findings are seen.
IMPRESSION: No active cardiopulmonary process.

## 2018-03-15 ENCOUNTER — Ambulatory Visit: Payer: Medicare Other | Admitting: Gastroenterology

## 2018-03-22 ENCOUNTER — Encounter: Payer: Self-pay | Admitting: Gastroenterology

## 2018-03-22 ENCOUNTER — Ambulatory Visit (INDEPENDENT_AMBULATORY_CARE_PROVIDER_SITE_OTHER): Payer: Medicare Other | Admitting: Gastroenterology

## 2018-03-22 VITALS — BP 145/83 | HR 71 | Ht 72.0 in | Wt 192.2 lb

## 2018-03-22 DIAGNOSIS — K5100 Ulcerative (chronic) pancolitis without complications: Secondary | ICD-10-CM

## 2018-03-22 NOTE — Progress Notes (Signed)
David [name] states that he is reluctant to have another colonoscopy due to bleeding after procedures.   We offered to have his GI, Cardiologist, and PCP to coordinate a possible treatment plan that would enable him to have a colonoscopy as risk free as possible.   He is concerned about extensive bleeding due to Plavix and Entresto usage. Also, he has a poor reaction to Propofol. He states that he is very confused and unable to formulate coherent thoughts after being under Propofol anesthesia.

## 2018-03-22 NOTE — Progress Notes (Signed)
[name] MD, MRCP(U.K) [address]  [place]  [place], [place] [address]  Main: [phone]  Fax: [phone]   Primary Care Physician: Kirk Ruths, MD  Primary Gastroenterologist:  Dr. [name]   No chief complaint on file.   HPI: David [name] is a 72 y.o. male    Summary of history :  Hehas had ulcerative colitis from 2007 . 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 Remicaid d/c in 2009 due to the malignant skin lesions. Since 2009 Not been on any medications. He was being followed by [facility] till 2015 when his doctor retired. Hospitalized in 2011 for flare of the colitis. His symptoms returned in early December 2017  when I took over his care . I performed a sigmoidoscopy on 09/25/16 and I noted moderate to severe left sided colitis. Biopsies Confirmed marked active colitis negative for HSV and CMV,Commenced him on mesalamine which he did not tolerate and was admitted to the hospital with symptoms. 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 Restarted on prednisone after a few days of antibiotics. Likely he had the clot secondary to colitis and subsequently the clot may have been infected. We wanted to transfer him to a tertiary center but he refused . He was seen at  [facility] Dr Daphane Shepherd who specializes in IBD for a second opinion as he has had prior skin cancer with regards to options. She saw him on 12/01/16.Commenced on remicaid 01/05/17.3 days after the first infusion had no blood or diarrhea and firming up of stools.Commenced on Entyvio in 05/2017 .    Interval history4/15/19 -03/22/2018   Did not have DEXA bone scan    On Entyvio. Labs 03/18/18 CRP- normal Hb 12.5 grams  . Albumin 4.5 grams . Hba1c 6.2 . TSH normal .   Not immune to Hep A/B , Tb test negative,CRP <0.8, vitamin D normal  in 09/2017 Weight been stable. 1 bowel movement a day , no blood . No abdominal pain.   Low energy levels for a year .  Did not have his hepatitis Vaccine.   Presently 1 or none bowel movemnets, soft , occasional diarrhea. No blood.   He is adamamt that he does not want a colonocopy Current Outpatient Medications  Medication Sig Dispense Refill  . ACCU-CHEK AVIVA PLUS test strip 1 each by Other route as needed for other.     Marland Kitchen ACCU-CHEK SOFTCLIX LANCETS lancets USE 2 TIMES DAILY. USE AS INSTRUCTED.    Marland Kitchen aspirin EC 81 MG tablet Take 81 mg by mouth daily.     Marland Kitchen atorvastatin (LIPITOR) 40 MG tablet Take 40 mg by mouth every morning.     . calcium carbonate (TUMS EX) 750 MG chewable tablet Chew 1 tablet by mouth daily as needed for heartburn.    . carvedilol (COREG) 3.125  MG tablet Take 3.125 mg by mouth 2 (two) times daily with a meal.     . Cholecalciferol (VITAMIN D) 2000 units CAPS Take 1 capsule by mouth 2 (two) times daily.     . clopidogrel (PLAVIX) 75 MG tablet Take by mouth.    . gabapentin (NEURONTIN) 100 MG capsule Take 1 capsule by mouth 2 (two) times daily.   11  . gabapentin (NEURONTIN) 300 MG capsule Take 300 mg by mouth at bedtime.    Marland Kitchen glipiZIDE (GLUCOTROL) 10 MG tablet Take 10 mg by mouth 2 (two) times daily.     Marland Kitchen levothyroxine (SYNTHROID, LEVOTHROID) 50 MCG tablet Take 50 mcg by mouth daily before breakfast.     . Multiple Vitamins-Minerals  (MULTIVITAMIN ADULT PO) Take 1 tablet by mouth daily.     Marland Kitchen nystatin cream (MYCOSTATIN) Apply topically.    . Omega-3 Fatty Acids (FISH OIL PO) Take 1 capsule by mouth 2 (two) times daily.     . pantoprazole (PROTONIX) 40 MG tablet Take 1 tablet by mouth daily.    . Probiotic Product (PROBIOTIC DAILY PO) Take 1 capsule by mouth daily.     Marland Kitchen pyridOXINE (VITAMIN B-6) 100 MG tablet Take 100 mg by mouth daily.    . sacubitril-valsartan (ENTRESTO) 24-26 MG Take 1 tablet by mouth 2 (two) times daily.     . vedolizumab (ENTYVIO) 300 MG injection Inject into the vein. Every 4 weeks    . vitamin B-12 (CYANOCOBALAMIN) 1000 MCG tablet Take 1,000 mcg by mouth daily.     Current Facility-Administered Medications  Medication Dose Route Frequency Provider Last Rate Last Dose  . inFLIXimab (REMICADE) 5 mg/kg = 400 mg in sodium chloride 0.9 % 250 mL infusion  5 mg/kg (Order-Specific) Intravenous Q21 days [name], MD        Allergies as of 03/22/2018 - Review Complete 12/07/2017  Allergen Reaction Noted  . Mesalamine Nausea Only and Nausea And Vomiting 12/01/2016    ROS:  General: Negative for anorexia, weight loss, fever, chills, fatigue, weakness. ENT: Negative for hoarseness, difficulty swallowing , nasal congestion. CV: Negative for chest pain, angina, palpitations, dyspnea on exertion, peripheral edema.  Respiratory: Negative for dyspnea at rest, dyspnea on exertion, cough, sputum, wheezing.  GI: See history of present illness. GU:  Negative for dysuria, hematuria, urinary incontinence, urinary frequency, nocturnal urination.  Endo: Negative for unusual weight change.    Physical Examination:   There were no vitals taken for this visit.  General: Well-nourished, well-developed in no acute distress.  Eyes: No icterus. Conjunctivae pink. Mouth: Oropharyngeal mucosa moist and pink , no lesions erythema or exudate. Lungs: Clear to auscultation bilaterally. Non-labored. Heart: Regular rate  and rhythm, no murmurs rubs or gallops.  Abdomen: Bowel sounds are normal, nontender, nondistended, no hepatosplenomegaly or masses, no abdominal bruits or hernia , no rebound or guarding.   Extremities: No lower extremity edema. No clubbing or deformities. Neuro: Alert and oriented x 3.  Grossly intact. Skin: Warm and dry, no jaundice.   Psych: Alert and cooperative, normal mood and affect.   Imaging Studies: No results found.  Assessment and Plan:   David [name] is a 72 y.o. y/o male  with ahistory of long standing ulcerative colitis- off treatment for many years and recent onset of symptoms while off all therapy .Prior history of skin cancer (melanoma and he recalls basal cell ca) in the past On anticoagulation for the SMV clot that occurred during a flare . He was  seen by Dr Daphane Shepherd at [facility] who specializes in IBD for a second opinion and we discussed his case after. Options werecolectomy which he was notkeen, use of Vedolizumab which has least possibility of skin cancer. Commenced on infliximab on 01/05/17 . Hadsevere reactions after the infusionsand [facility] . Presently has no symptoms except long standing left upper quadrant mild discomfort  , doing better, biochemically in remission too.He is over due for a colonoscopy for dysplasia surveillance. I stressed the importance of proceeding with a colonoscopy .I clearly stated that he is at a markedly increased risk for colon cancer and strongly suggest colonoscopy. He said he absolutely did not want one and asked me if I could guarantee him that he wont bleed after one. I said I could not provide any such assurances although I will be extremely careful, I explained the risks of a colonoscopy which I would for any patient and includes risk of bleeding, perforation and anesthesia related complications .   Plan  1. Annual skin exam  2. Hep A/B vaccine with Kirk Ruths, MD  3. DEXA bone scan (ordered  last visit not donw) 4. Call my office if he decides to proceed with colonoscopy for colon cancer screening and dysplasia screening    Dr [name]  MD,MRCP [facility]) Follow up in 4-6 months

## 2018-03-22 NOTE — Patient Instructions (Signed)
1. Call Central Scheduling at [phone] to schedule DEXA scan.  2. Contact PCP concerning having Hep A & B vaccines completed.

## 2018-03-25 DIAGNOSIS — I5022 Chronic systolic (congestive) heart failure: Secondary | ICD-10-CM | POA: Insufficient documentation

## 2018-03-28 ENCOUNTER — Other Ambulatory Visit: Payer: Self-pay | Admitting: Internal Medicine

## 2018-03-28 DIAGNOSIS — S22080A Wedge compression fracture of T11-T12 vertebra, initial encounter for closed fracture: Secondary | ICD-10-CM

## 2018-03-28 DIAGNOSIS — R937 Abnormal findings on diagnostic imaging of other parts of musculoskeletal system: Secondary | ICD-10-CM

## 2018-03-29 ENCOUNTER — Inpatient Hospital Stay: Payer: Medicare Other | Attending: Internal Medicine

## 2018-03-29 VITALS — BP 104/65 | HR 59 | Temp 96.5°F | Resp 18

## 2018-03-29 DIAGNOSIS — K51811 Other ulcerative colitis with rectal bleeding: Secondary | ICD-10-CM | POA: Insufficient documentation

## 2018-03-29 DIAGNOSIS — K51919 Ulcerative colitis, unspecified with unspecified complications: Secondary | ICD-10-CM

## 2018-03-29 MED ORDER — VEDOLIZUMAB 300 MG IV SOLR
300.0000 mg | Freq: Once | INTRAVENOUS | Status: AC
  Administered 2018-03-29: 300 mg via INTRAVENOUS
  Filled 2018-03-29 (×2): qty 5

## 2018-03-29 MED ORDER — SODIUM CHLORIDE 0.9 % IV SOLN
Freq: Once | INTRAVENOUS | Status: AC
  Administered 2018-03-29: 09:00:00 via INTRAVENOUS
  Filled 2018-03-29: qty 1000

## 2018-04-11 ENCOUNTER — Ambulatory Visit
Admission: RE | Admit: 2018-04-11 | Discharge: 2018-04-11 | Disposition: A | Discharge status: Home / Self Care | Payer: Medicare Other | Source: Ambulatory Visit | Attending: Internal Medicine | Admitting: Internal Medicine

## 2018-04-11 DIAGNOSIS — S22080A Wedge compression fracture of T11-T12 vertebra, initial encounter for closed fracture: Secondary | ICD-10-CM | POA: Diagnosis not present

## 2018-04-11 DIAGNOSIS — X58XXXA Exposure to other specified factors, initial encounter: Secondary | ICD-10-CM | POA: Insufficient documentation

## 2018-04-11 DIAGNOSIS — R937 Abnormal findings on diagnostic imaging of other parts of musculoskeletal system: Secondary | ICD-10-CM | POA: Insufficient documentation

## 2018-05-11 ENCOUNTER — Other Ambulatory Visit: Payer: Self-pay | Admitting: Physical Medicine and Rehabilitation

## 2018-05-11 DIAGNOSIS — M545 Low back pain: Principal | ICD-10-CM

## 2018-05-11 DIAGNOSIS — G8929 Other chronic pain: Secondary | ICD-10-CM

## 2018-05-15 ENCOUNTER — Ambulatory Visit
Admission: RE | Admit: 2018-05-15 | Discharge: 2018-05-15 | Disposition: A | Discharge status: Home / Self Care | Payer: Medicare Other | Source: Ambulatory Visit | Attending: Physical Medicine and Rehabilitation | Admitting: Physical Medicine and Rehabilitation

## 2018-05-15 DIAGNOSIS — M5126 Other intervertebral disc displacement, lumbar region: Secondary | ICD-10-CM | POA: Insufficient documentation

## 2018-05-15 DIAGNOSIS — M48061 Spinal stenosis, lumbar region without neurogenic claudication: Secondary | ICD-10-CM | POA: Insufficient documentation

## 2018-05-15 DIAGNOSIS — G8929 Other chronic pain: Secondary | ICD-10-CM

## 2018-05-15 DIAGNOSIS — M545 Low back pain, unspecified: Secondary | ICD-10-CM

## 2018-05-15 DIAGNOSIS — M5136 Other intervertebral disc degeneration, lumbar region: Secondary | ICD-10-CM | POA: Insufficient documentation

## 2018-05-19 ENCOUNTER — Other Ambulatory Visit: Payer: Self-pay

## 2018-05-19 MED ORDER — VEDOLIZUMAB 300 MG IV SOLR: INTRAVENOUS | 3 refills | Status: DC

## 2018-05-20 ENCOUNTER — Telehealth: Payer: Self-pay | Admitting: Gastroenterology

## 2018-05-20 NOTE — Telephone Encounter (Signed)
Lattie Haw with [facility] left vm regarding  Pt perscription they need to know the frequency of rx entivio so they can schedule the shiptment please call [phone]

## 2018-05-20 NOTE — Telephone Encounter (Signed)
Spoke with pharmacy and medication has been clarified that 300 mg Injection every 8 weeks per pt history

## 2018-05-24 ENCOUNTER — Inpatient Hospital Stay: Payer: Medicare Other

## 2018-05-24 ENCOUNTER — Encounter: Payer: Self-pay | Admitting: Internal Medicine

## 2018-05-24 ENCOUNTER — Inpatient Hospital Stay: Payer: Medicare Other | Attending: Internal Medicine | Admitting: Internal Medicine

## 2018-05-24 VITALS — BP 105/66 | HR 58 | Temp 96.0°F | Resp 16

## 2018-05-24 VITALS — BP 130/74 | HR 69 | Temp 97.1°F | Resp 16 | Wt 186.3 lb

## 2018-05-24 DIAGNOSIS — Z7982 Long term (current) use of aspirin: Secondary | ICD-10-CM | POA: Diagnosis not present

## 2018-05-24 DIAGNOSIS — Z85820 Personal history of malignant melanoma of skin: Secondary | ICD-10-CM | POA: Insufficient documentation

## 2018-05-24 DIAGNOSIS — Z5112 Encounter for antineoplastic immunotherapy: Secondary | ICD-10-CM | POA: Diagnosis not present

## 2018-05-24 DIAGNOSIS — E119 Type 2 diabetes mellitus without complications: Secondary | ICD-10-CM | POA: Insufficient documentation

## 2018-05-24 DIAGNOSIS — K51919 Ulcerative colitis, unspecified with unspecified complications: Secondary | ICD-10-CM

## 2018-05-24 DIAGNOSIS — I252 Old myocardial infarction: Secondary | ICD-10-CM | POA: Insufficient documentation

## 2018-05-24 DIAGNOSIS — I2510 Atherosclerotic heart disease of native coronary artery without angina pectoris: Secondary | ICD-10-CM | POA: Diagnosis not present

## 2018-05-24 DIAGNOSIS — Z7984 Long term (current) use of oral hypoglycemic drugs: Secondary | ICD-10-CM | POA: Insufficient documentation

## 2018-05-24 DIAGNOSIS — Z79899 Other long term (current) drug therapy: Secondary | ICD-10-CM | POA: Insufficient documentation

## 2018-05-24 DIAGNOSIS — K51511 Left sided colitis with rectal bleeding: Secondary | ICD-10-CM | POA: Diagnosis present

## 2018-05-24 MED ORDER — VEDOLIZUMAB 300 MG IV SOLR
300.0000 mg | Freq: Once | INTRAVENOUS | Status: AC
  Administered 2018-05-24: 300 mg via INTRAVENOUS
  Filled 2018-05-24 (×2): qty 5

## 2018-05-24 MED ORDER — SODIUM CHLORIDE 0.9 % IV SOLN
Freq: Once | INTRAVENOUS | Status: AC
  Administered 2018-05-24: 10:00:00 via INTRAVENOUS
  Filled 2018-05-24: qty 1000

## 2018-05-24 NOTE — Progress Notes (Signed)
[facility] OFFICE PROGRESS NOTE  Patient Care Team: Kirk Ruths, MD as PCP - General (Internal Medicine) [name], MD as Consulting Physician (Surgery)  Cancer Staging No matching staging information was found for the patient.    No history exists.   # Ulcerative Colitis [chronic]- infliximab [poor tol- Arthritis-stopped 1610]; Aug 2018- Entiviyo  # CAD [Dr.Fath; conservative measures]  INTERVAL HISTORY:  David [name] 72 y.o.  male pleasant patient Long-standing history of ulcerative colitis- moderate to severe followed by Dr. Jerrilyn Cairo.   Patient is currently on Entiviyo-tolerating well.  Patient denies any significant body aches or joint pains post infusion.Patient denies any unusual weight loss or lumps or bumps.    REVIEW OF SYSTEMS:  A complete 10 point review of system is done which is negative except mentioned above/history of present illness.   PAST MEDICAL HISTORY :  Past Medical History:  Diagnosis Date  . Anemia   . CAD (coronary artery disease) 07/08/2011   Overview:  a. 07/2006: Anterior ST elevation MI. b. 07/2006: PCI with BMS to LAD and RCA. c. 09/2006: Non ST elevation. d. LVEF 30%.  Discussed ICD, not currently interested.   . 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 ([place]) 9/60/4540  . Dyslipidemia 07/30/2011   Overview:  High triglycerides  . GERD (gastroesophageal reflux disease) 07/08/2011  . History of Clostridium difficile colitis   . History of myocardial infarction   . Hyperlipidemia, unspecified 07/08/2011  . Hypothyroidism   . 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 Type II diabetes mellitus, uncontrolled ([place]) 07/08/2011  . Vitamin D deficiency     PAST SURGICAL HISTORY :   Past Surgical History:  Procedure Laterality Date  . CARDIAC CATHETERIZATION    . CHOLECYSTECTOMY    . COLONOSCOPY  07/20/2006   Crohn's disease  . CORONARY ANGIOGRAPHY N/A 07/07/2017   Procedure: CORONARY  ANGIOGRAPHY;  Surgeon: Teodoro Spray, MD;  Location: [facility];  Service: Cardiovascular;  Laterality: N/A;  . CORONARY STENT PLACEMENT    . FLEXIBLE SIGMOIDOSCOPY N/A 09/25/2016   Procedure: FLEXIBLE SIGMOIDOSCOPY;  Surgeon: [name], MD;  Location: [facility] ENDOSCOPY;  Service: Endoscopy;  Laterality: N/A;  . LEFT HEART CATH Left 07/07/2017   Procedure: Left Heart Cath;  Surgeon: Teodoro Spray, MD;  Location: [facility];  Service: Cardiovascular;  Laterality: Left;  Marland Kitchen MELANOMA REMOVED    . parotid gland removal      FAMILY HISTORY :   Family History  Problem Relation Age of Onset  . Heart disease Mother   . Heart attack Father   . Heart disease Sister   . Heart disease Brother     SOCIAL HISTORY:   Social History   Tobacco Use  . Smoking status: Never Smoker  . Smokeless tobacco: Never Used  Substance Use Topics  . Alcohol use: No  . Drug use: No    ALLERGIES:  is allergic to mesalamine.  MEDICATIONS:  Current Outpatient Medications  Medication Sig Dispense Refill  . ACCU-CHEK AVIVA PLUS test strip 1 each by Other route as needed for other.     Marland Kitchen ACCU-CHEK SOFTCLIX LANCETS lancets USE 2 TIMES DAILY. USE AS INSTRUCTED.    Marland Kitchen aspirin EC 81 MG tablet Take 81 mg by mouth daily.     Marland Kitchen atorvastatin (LIPITOR) 40 MG tablet Take 40 mg by mouth every morning.     . calcium carbonate (TUMS EX) 750 MG chewable tablet Chew 1 tablet  by mouth daily as needed for heartburn.    . carvedilol (COREG) 3.125 MG tablet Take 3.125 mg by mouth 2 (two) times daily with a meal.     . Cholecalciferol (VITAMIN D) 2000 units CAPS Take 1 capsule by mouth 2 (two) times daily.     . clopidogrel (PLAVIX) 75 MG tablet Take by mouth.    . gabapentin (NEURONTIN) 100 MG capsule Take 1 capsule by mouth 2 (two) times daily.   11  . gabapentin (NEURONTIN) 300 MG capsule Take 300 mg by mouth at bedtime.    Marland Kitchen glipiZIDE (GLUCOTROL) 10 MG tablet Take 10 mg by mouth 2 (two) times daily.     Marland Kitchen  levothyroxine (SYNTHROID, LEVOTHROID) 50 MCG tablet Take 50 mcg by mouth daily before breakfast.     . Multiple Vitamins-Minerals (MULTIVITAMIN ADULT PO) Take 1 tablet by mouth daily.     Marland Kitchen nystatin cream (MYCOSTATIN) Apply topically.    . Omega-3 Fatty Acids (FISH OIL PO) Take 1 capsule by mouth 2 (two) times daily.     . pantoprazole (PROTONIX) 40 MG tablet Take 1 tablet by mouth daily.    . Probiotic Product (PROBIOTIC DAILY PO) Take 1 capsule by mouth daily.     Marland Kitchen pyridOXINE (VITAMIN B-6) 100 MG tablet Take 100 mg by mouth daily.    . sacubitril-valsartan (ENTRESTO) 24-26 MG Take 1 tablet by mouth 2 (two) times daily.     . vedolizumab (ENTYVIO) 300 MG injection Inject into the vein. Every 4 weeks 1 vial 3  . vitamin B-12 (CYANOCOBALAMIN) 1000 MCG tablet Take 1,000 mcg by mouth daily.     Current Facility-Administered Medications  Medication Dose Route Frequency Provider Last Rate Last Dose  . inFLIXimab (REMICADE) 5 mg/kg = 400 mg in sodium chloride 0.9 % 250 mL infusion  5 mg/kg (Order-Specific) Intravenous Q21 days [name], MD        PHYSICAL EXAMINATION: ECOG PERFORMANCE STATUS: 0 - Asymptomatic  BP 130/74 (BP Location: Left Arm, Patient Position: Sitting)   Pulse 69   Temp (!) 97.1 F (36.2 C) (Tympanic)   Resp 16   Wt 186 lb 4.6 oz (84.5 kg)   BMI 25.27 kg/m   Filed Weights   05/24/18 0932  Weight: 186 lb 4.6 oz (84.5 kg)    GENERAL: Well-nourished well-developed; Alert, no distress and comfortable.   Alone.  EYES: no pallor or icterus OROPHARYNX: no thrush or ulceration; good dentition  NECK: supple, no masses felt LYMPH:  no palpable lymphadenopathy in the cervical, axillary or inguinal regions LUNGS: clear to auscultation and  No wheeze or crackles HEART/CVS: regular rate & rhythm and no murmurs; No lower extremity edema ABDOMEN:abdomen soft, non-tender and normal bowel sounds Musculoskeletal:no cyanosis of digits and no clubbing  PSYCH: alert & oriented x 3  with fluent speech NEURO: no focal motor/sensory deficits SKIN:  no rashes or significant lesions  LABORATORY DATA:  I have reviewed the data as listed    Component Value Date/Time   NA 139 09/24/2017 0957   K 3.9 09/24/2017 0957   CL 106 09/24/2017 0957   CO2 25 09/24/2017 0957   GLUCOSE 89 09/24/2017 0957   BUN 8 09/24/2017 0957   CREATININE 1.00 09/24/2017 0957   CALCIUM 8.9 09/24/2017 0957   PROT 7.3 09/24/2017 0957   ALBUMIN 3.7 09/24/2017 0957   AST 25 09/24/2017 0957   ALT 18 09/24/2017 0957   ALKPHOS 71 09/24/2017 0957   BILITOT 0.8 09/24/2017 0957  GFRNONAA >60 09/24/2017 0957   GFRAA >60 09/24/2017 0957    No results found for: SPEP, UPEP  Lab Results  Component Value Date   WBC 6.0 09/24/2017   NEUTROABS 3.2 09/24/2017   HGB 13.1 09/24/2017   HCT 37.9 (L) 09/24/2017   MCV 88.8 09/24/2017   PLT 240 09/24/2017      Chemistry      Component Value Date/Time   NA 139 09/24/2017 0957   K 3.9 09/24/2017 0957   CL 106 09/24/2017 0957   CO2 25 09/24/2017 0957   BUN 8 09/24/2017 0957   CREATININE 1.00 09/24/2017 0957      Component Value Date/Time   CALCIUM 8.9 09/24/2017 0957   ALKPHOS 71 09/24/2017 0957   AST 25 09/24/2017 0957   ALT 18 09/24/2017 0957   BILITOT 0.8 09/24/2017 0957       RADIOGRAPHIC STUDIES: I have personally reviewed the radiological images as listed and agreed with the findings in the report. No results found.   ASSESSMENT & PLAN:  Left sided ulcerative colitis with rectal bleeding ([place]) # Mod-Severe ulcerative colitis-  on Entivyo currently on every 8-week schedule.  Tolerating well.  Ulcerative colitis seems to be under control.   STABLE; reviwed the blood work with PCP.  # back pain- s/p MRI reviewed ; Dr.Chasnis.   # Infusion every 8 weeks; follow-up with me in 6 months labs.   Cc; Dr.Anna.    No orders of the defined types were placed in this encounter.  All questions were answered. The patient knows to call the  clinic with any problems, questions or concerns.      Cammie Sickle, MD 05/31/2018 5:24 PM

## 2018-05-24 NOTE — Assessment & Plan Note (Addendum)
#   Mod-Severe ulcerative colitis-  on Entivyo currently on every 8-week schedule.  Tolerating well.  Ulcerative colitis seems to be under control.   STABLE; reviwed the blood work with PCP.  # back pain- s/p MRI reviewed ; Dr.Chasnis.   # Infusion every 8 weeks; follow-up with me in 6 months labs.   Cc; Dr.Anna.

## 2018-05-31 ENCOUNTER — Telehealth: Payer: Self-pay | Admitting: Internal Medicine

## 2018-05-31 NOTE — Telephone Encounter (Signed)
Please schedule the following in David [name] every 8 weeks; follow-up with MD 6 months CBC CMP/infusion. -Dr.B

## 2018-07-19 ENCOUNTER — Inpatient Hospital Stay: Payer: Medicare Other | Attending: Internal Medicine

## 2018-07-19 VITALS — BP 88/54 | HR 67 | Temp 96.0°F | Resp 18

## 2018-07-19 DIAGNOSIS — Z5112 Encounter for antineoplastic immunotherapy: Secondary | ICD-10-CM | POA: Insufficient documentation

## 2018-07-19 DIAGNOSIS — K51919 Ulcerative colitis, unspecified with unspecified complications: Secondary | ICD-10-CM | POA: Diagnosis present

## 2018-07-19 MED ORDER — VEDOLIZUMAB 300 MG IV SOLR
300.0000 mg | Freq: Once | INTRAVENOUS | Status: AC
  Administered 2018-07-19: 300 mg via INTRAVENOUS
  Filled 2018-07-19: qty 5

## 2018-07-19 MED ORDER — SODIUM CHLORIDE 0.9 % IV SOLN
Freq: Once | INTRAVENOUS | Status: AC
  Administered 2018-07-19: 10:00:00 via INTRAVENOUS
  Filled 2018-07-19: qty 250

## 2018-07-19 NOTE — Patient Instructions (Signed)
Vedolizumab injection solution What is this medicine? VEDOLIZUMAB (Ve doe LIZ you mab) is used to treat ulcerative colitis and Crohn's disease in adult patients. This medicine may be used for other purposes; ask your health care provider or pharmacist if you have questions. COMMON BRAND NAME(S): Entyvio What should I tell my health care provider before I take this medicine? They need to know if you have any of these conditions: -diabetes -hepatitis B or history of hepatitis B infection -HIV or AIDS -immune system problems -infection or history of infections -liver disease -recently received or scheduled to receive a vaccine -scheduled to have surgery -tuberculosis, a positive skin test for tuberculosis or have recently been in close contact with someone who has tuberculosis - an unusual or allergic reaction to vedolizumab, other medicines, foods, dyes, or preservatives -pregnant or trying to get pregnant -breast-feeding How should I use this medicine? This medicine is for infusion into a vein. It is given by a health care professional in a hospital or clinic setting. A special MedGuide will be given to you by the pharmacist with each prescription and refill. Be sure to read this information carefully each time. Talk to your pediatrician regarding the use of this medicine in children. This medicine is not approved for use in children. Overdosage: If you think you have taken too much of this medicine contact a poison control center or emergency room at once. NOTE: This medicine is only for you. Do not share this medicine with others. What if I miss a dose? It is important not to miss your dose. Call your doctor or health care professional if you are unable to keep an appointment. What may interact with this medicine? -steroid medicines like prednisone or cortisone -TNF-alpha inhibitors like natalizumab, adalimumab, and infliximab -vaccines This list may not describe all possible  interactions. Give your health care provider a list of all the medicines, herbs, non-prescription drugs, or dietary supplements you use. Also tell them if you smoke, drink alcohol, or use illegal drugs. Some items may interact with your medicine. What should I watch for while using this medicine? Your condition will be monitored carefully while you are receiving this medicine. Visit your doctor for regular check ups. Tell your doctor or healthcare professional if your symptoms do not start to get better or if they get worse. Stay away from people who are sick. Call your doctor or health care professional for advice if you get a fever, chills or sore throat, or other symptoms of a cold or flu. Do not treat yourself. In some patients, this medicine may cause a serious brain infection that may cause death. If you have any problems seeing, thinking, speaking, walking, or standing, tell your doctor right away. If you cannot reach your doctor, get urgent medical care. What side effects may I notice from receiving this medicine? Side effects that you should report to your doctor or health care professional as soon as possible: -allergic reactions like skin rash, itching or hives, swelling of the face, lips, or tongue -breathing problems -changes in vision -chest pain -dark urine -depression, feelings of sadness -dizziness -general ill feeling or flu-like symptoms -irregular, missed, or painful menstrual periods -light-colored stools -loss of appetite, nausea -muscle weakness -problems with balance, talking, or walking -right upper belly pain -unusually weak or tired -yellowing of the eyes or skin Side effects that usually do not require medical attention (report to your doctor or health care professional if they continue or are bothersome): -aches, pains -headache -  stomach upset -tiredness This list may not describe all possible side effects. Call your doctor for medical advice about side  effects. You may report side effects to FDA at 1-800-FDA-1088. Where should I keep my medicine? This drug is given in a hospital or clinic and will not be stored at home. NOTE: This sheet is a summary. It may not cover all possible information. If you have questions about this medicine, talk to your doctor, pharmacist, or health care provider.  2018 Elsevier/Gold Standard (2015-10-31 08:36:51)

## 2018-08-11 ENCOUNTER — Telehealth: Payer: Self-pay | Admitting: Gastroenterology

## 2018-08-11 NOTE — Telephone Encounter (Signed)
Jasmine from [employer] is calling to inform that 2nd and 3rd pages are missing on patients application as well as prove of income  cb [phone]

## 2018-08-12 NOTE — Telephone Encounter (Signed)
Pt completed application has been faxed.

## 2018-09-13 ENCOUNTER — Inpatient Hospital Stay: Payer: Medicare Other | Attending: Internal Medicine

## 2018-09-13 VITALS — BP 118/60 | HR 61 | Temp 96.0°F | Resp 18

## 2018-09-13 DIAGNOSIS — K51919 Ulcerative colitis, unspecified with unspecified complications: Secondary | ICD-10-CM

## 2018-09-13 DIAGNOSIS — K51511 Left sided colitis with rectal bleeding: Secondary | ICD-10-CM | POA: Diagnosis present

## 2018-09-13 DIAGNOSIS — Z5112 Encounter for antineoplastic immunotherapy: Secondary | ICD-10-CM | POA: Insufficient documentation

## 2018-09-13 MED ORDER — VEDOLIZUMAB 300 MG IV SOLR
300.0000 mg | Freq: Once | INTRAVENOUS | Status: AC
  Administered 2018-09-13: 300 mg via INTRAVENOUS
  Filled 2018-09-13: qty 5

## 2018-09-13 MED ORDER — SODIUM CHLORIDE 0.9 % IV SOLN
Freq: Once | INTRAVENOUS | Status: AC
  Administered 2018-09-13: 10:00:00 via INTRAVENOUS
  Filled 2018-09-13: qty 250

## 2018-09-20 DIAGNOSIS — M19049 Primary osteoarthritis, unspecified hand: Secondary | ICD-10-CM | POA: Insufficient documentation

## 2018-09-20 DIAGNOSIS — M47816 Spondylosis without myelopathy or radiculopathy, lumbar region: Secondary | ICD-10-CM | POA: Insufficient documentation

## 2018-09-22 ENCOUNTER — Other Ambulatory Visit: Payer: Self-pay | Admitting: Gastroenterology

## 2018-09-22 ENCOUNTER — Encounter: Payer: Self-pay | Admitting: Gastroenterology

## 2018-09-22 ENCOUNTER — Ambulatory Visit (INDEPENDENT_AMBULATORY_CARE_PROVIDER_SITE_OTHER): Payer: Medicare Other | Admitting: Gastroenterology

## 2018-09-22 VITALS — BP 110/65 | HR 65 | Ht 72.0 in | Wt 193.2 lb

## 2018-09-22 DIAGNOSIS — K5100 Ulcerative (chronic) pancolitis without complications: Secondary | ICD-10-CM

## 2018-09-22 NOTE — Progress Notes (Signed)
[name] MD, MRCP(U.K) [address]  [place]  [place], [place] [address]  Main: [phone]  Fax: [phone]   Primary Care Physician: Kirk Ruths, MD  Primary Gastroenterologist:  Dr. [name]   No chief complaint on file.   HPI: TRESON LAURA is a 72 y.o. male   Summary of history :  Hehas had ulcerative colitis from 2007 . 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 Remicaid d/c in 2009 due to the malignant skin lesions. Since 2009 Not been on any medications. He was being followed by [facility] till 2015 when his doctor retired. Hospitalized in 2011 for flare of the colitis. His symptoms returned in early December 2017when I took over his care . I performed a sigmoidoscopy on 09/25/16 and I noted moderate to severe left sided colitis. Biopsies Confirmed marked active colitis negative for HSV and CMV,Commenced him on mesalamine which he did not tolerate and was admitted to the hospital with symptoms. 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 Restarted on prednisone after a few days of antibiotics. Likely he had the clot secondary to colitis and subsequently the clot may have been infected. We wanted to transfer him to a tertiary center but he refused . He was seen at [facility] Dr Daphane Shepherd who specializes in IBD for a second opinion as he has had prior skin cancer with regards to options. She saw him on 12/01/16.Commenced on remicaid 01/05/17.3 days after the first infusion had no blood or diarrhea and firming up of stools.Commenced on Entyvio in 05/2017 .    Interval history6/08/2018-09/22/18  Did not have DEXA bone scan  . He says that he is seeing a Rheumatologist.  No new CBC last 6 months Weight stable.Not immune to Hep A/B , Tb test negative in 2018 , CRP <0.8,  Did not get vaccination for hepatitis A/ B He does not want a colonocopy  No issues with his bowels. No bleeding , 1 bowel movement a day , sometimes every other day .   Current Outpatient Medications  Medication Sig Dispense Refill  . ACCU-CHEK AVIVA PLUS test strip 1 each by Other route as needed for other.     Marland Kitchen ACCU-CHEK SOFTCLIX LANCETS lancets USE 2 TIMES DAILY. USE AS INSTRUCTED.    Marland Kitchen aspirin EC 81 MG tablet Take 81 mg by mouth daily.     Marland Kitchen atorvastatin (LIPITOR) 40 MG tablet Take 40 mg by mouth every morning.     . calcium carbonate (TUMS EX) 750 MG chewable tablet Chew 1 tablet by mouth daily as needed for heartburn.    . carvedilol (COREG) 3.125 MG tablet Take 3.125 mg by mouth 2 (two) times daily with a meal.     . Cholecalciferol (VITAMIN D) 2000 units CAPS Take 1 capsule by mouth 2 (two) times daily.     Marland Kitchen  gabapentin (NEURONTIN) 100 MG capsule Take 1 capsule by mouth 2 (two) times daily.   11  . gabapentin (NEURONTIN) 300 MG capsule Take 300 mg by mouth at bedtime.    Marland Kitchen glipiZIDE (GLUCOTROL) 10 MG tablet Take 10 mg by mouth 2 (two) times daily.     Marland Kitchen levothyroxine (SYNTHROID, LEVOTHROID) 50 MCG tablet Take 50 mcg by mouth daily before breakfast.     . Multiple Vitamins-Minerals (MULTIVITAMIN ADULT PO) Take 1 tablet by mouth daily.     . Omega-3 Fatty Acids (FISH OIL PO) Take 1 capsule by mouth 2 (two) times daily.     . pantoprazole (PROTONIX) 40 MG tablet Take 1 tablet by mouth  daily.    . Probiotic Product (PROBIOTIC DAILY PO) Take 1 capsule by mouth daily.     Marland Kitchen pyridOXINE (VITAMIN B-6) 100 MG tablet Take 100 mg by mouth daily.    . sacubitril-valsartan (ENTRESTO) 24-26 MG Take 1 tablet by mouth 2 (two) times daily.     . vedolizumab (ENTYVIO) 300 MG injection Inject into the vein. Every 4 weeks 1 vial 3  . vitamin B-12 (CYANOCOBALAMIN) 1000 MCG tablet Take 1,000 mcg by mouth daily.     Current Facility-Administered Medications  Medication Dose Route Frequency Provider Last Rate Last Dose  . inFLIXimab (REMICADE) 5 mg/kg = 400 mg in sodium chloride 0.9 % 250 mL infusion  5 mg/kg (Order-Specific) Intravenous Q21 days [name], MD        Allergies as of 09/22/2018 - Review Complete 05/24/2018  Allergen Reaction Noted  . Mesalamine Nausea Only and Nausea And Vomiting 12/01/2016    ROS:  General: Negative for anorexia, weight loss, fever, chills, fatigue, weakness. ENT: Negative for hoarseness, difficulty swallowing , nasal congestion. CV: Negative for chest pain, angina, palpitations, dyspnea on exertion, peripheral edema.  Respiratory: Negative for dyspnea at rest, dyspnea on exertion, cough, sputum, wheezing.  GI: See history of present illness. GU:  Negative for dysuria, hematuria, urinary incontinence, urinary frequency, nocturnal urination.  Endo: Negative for unusual weight change.    Physical Examination:   There were no vitals taken for this visit.  General: Well-nourished, well-developed in no acute distress.  Eyes: No icterus. Conjunctivae pink. Mouth: Oropharyngeal mucosa moist and pink , no lesions erythema or exudate. Lungs: Clear to auscultation bilaterally. Non-labored. Heart: Regular rate and rhythm, no murmurs rubs or gallops.  Abdomen: Bowel sounds are normal, nontender, nondistended, no hepatosplenomegaly or masses, no abdominal bruits or hernia , no rebound or guarding.   Extremities: No lower extremity edema. No clubbing or  deformities. Neuro: Alert and oriented x 3.  Grossly intact. Skin: Warm and dry, no jaundice.   Psych: Alert and cooperative, normal mood and affect.   Imaging Studies: No results found.  Assessment and Plan:   STEPHFON BOVEY is a 23 y.o. y/o male here to follow up . He has h ahistory of long standing ulcerative colitis- off treatment for many years and recent onset of symptoms while off all therapy .Prior history of skin cancer (melanoma and he recalls basal cell ca) in the past On anticoagulation for the SMV clot that occurred during a flare . He was seen by Dr Daphane Shepherd at [facility] who specializes in IBD for a second opinion and we discussed his case after. Options werecolectomy which he was notkeen, use of Vedolizumab which has least possibility of skin cancer. Commenced on infliximab on 01/05/17 . Hadsevere reactions after the infusionsand decided tochange to [facility] in [facility]/2017 .  Clinically in remission, he refuses DEXA bone scan, annual skin exam , immunizations and a colonoscopy . Discussed extensively over prior visits, he is aware of the risk of colon cancer.    Plan  1. CBC,CRP,CMP,vitamin D levels  2.  Call my office if he decides to proceed with colonoscopy for colon cancer screening and dysplasia screening    Dr [name]  MD,MRCP [facility]) Follow up in 6 months

## 2018-09-27 ENCOUNTER — Encounter: Payer: Self-pay | Admitting: Gastroenterology

## 2018-09-27 LAB — CBC WITH DIFFERENTIAL/PLATELET
Basophils Absolute: 0 10*3/uL (ref 0.0–0.2)
Basos: 1 %
EOS (ABSOLUTE): 0.1 10*3/uL (ref 0.0–0.4)
Eos: 2 %
Hematocrit: 35.1 % — ABNORMAL LOW (ref 37.5–51.0)
Hemoglobin: 12.3 g/dL — ABNORMAL LOW (ref 13.0–17.7)
Immature Grans (Abs): 0 10*3/uL (ref 0.0–0.1)
Immature Granulocytes: 0 %
Lymphocytes Absolute: 1.6 10*3/uL (ref 0.7–3.1)
Lymphs: 27 %
MCH: 31.5 pg (ref 26.6–33.0)
MCHC: 35 g/dL (ref 31.5–35.7)
MCV: 90 fL (ref 79–97)
Monocytes Absolute: 0.5 10*3/uL (ref 0.1–0.9)
Monocytes: 9 %
Neutrophils Absolute: 3.7 10*3/uL (ref 1.4–7.0)
Neutrophils: 61 %
PLATELETS: 276 10*3/uL (ref 150–450)
RBC: 3.91 x10E6/uL — ABNORMAL LOW (ref 4.14–5.80)
RDW: 12.9 % (ref 12.3–15.4)
WBC: 6 10*3/uL (ref 3.4–10.8)

## 2018-09-27 LAB — COMPREHENSIVE METABOLIC PANEL
ALT: 26 IU/L (ref 0–44)
AST: 32 IU/L (ref 0–40)
Albumin/Globulin Ratio: 2 (ref 1.2–2.2)
Albumin: 4.3 g/dL (ref 3.5–4.8)
Alkaline Phosphatase: 81 IU/L (ref 39–117)
BUN/Creatinine Ratio: 11 (ref 10–24)
BUN: 10 mg/dL (ref 8–27)
Bilirubin Total: 0.7 mg/dL (ref 0.0–1.2)
CO2: 21 mmol/L (ref 20–29)
Calcium: 9.2 mg/dL (ref 8.6–10.2)
Chloride: 103 mmol/L (ref 96–106)
Creatinine, Ser: 0.94 mg/dL (ref 0.76–1.27)
GFR calc Af Amer: 93 mL/min/{1.73_m2} (ref >59)
GFR calc non Af Amer: 81 mL/min/{1.73_m2} (ref >59)
GLUCOSE: 153 mg/dL — AB (ref 65–99)
Globulin, Total: 2.2 g/dL (ref 1.5–4.5)
Potassium: 4.4 mmol/L (ref 3.5–5.2)
Sodium: 146 mmol/L — ABNORMAL HIGH (ref 134–144)
Total Protein: 6.5 g/dL (ref 6.0–8.5)

## 2018-09-27 LAB — VITAMIN D 1,25 DIHYDROXY
Vitamin D 1, 25 (OH)2 Total: 44 pg/mL
Vitamin D2 1, 25 (OH)2: <10 pg/mL
Vitamin D3 1, 25 (OH)2: 44 pg/mL

## 2018-09-27 LAB — C-REACTIVE PROTEIN: CRP: 1 mg/L (ref 0–10)

## 2018-09-30 ENCOUNTER — Telehealth: Payer: Self-pay

## 2018-09-30 ENCOUNTER — Other Ambulatory Visit: Payer: Self-pay

## 2018-09-30 NOTE — Telephone Encounter (Signed)
Spoke with pt and informed him of lab results and that a result letter was mailed to him on 09-27-18. Also explained to pt that we are still waiting for the results of the tuberculosis lab test and will contact pt with the result once it is reviewed.

## 2018-10-01 LAB — QUANTIFERON-TB GOLD PLUS
QuantiFERON Nil Value: 0.03 IU/mL
QuantiFERON TB1 Ag Value: 0.03 IU/mL
QuantiFERON TB2 Ag Value: 0.01 IU/mL
QuantiFERON-TB Gold Plus: NEGATIVE

## 2018-10-02 ENCOUNTER — Encounter: Payer: Self-pay | Admitting: Gastroenterology

## 2018-10-07 ENCOUNTER — Telehealth: Payer: Self-pay | Admitting: Gastroenterology

## 2018-10-07 NOTE — Telephone Encounter (Signed)
Matt with [facility] called & request a new prescription for Entyvio 360m. [phone]

## 2018-10-11 ENCOUNTER — Other Ambulatory Visit: Payer: Self-pay

## 2018-10-11 MED ORDER — VEDOLIZUMAB 300 MG IV SOLR: INTRAVENOUS | 6 refills | Status: DC

## 2018-10-11 NOTE — Telephone Encounter (Signed)
Pt new Entyvio prescription has been faxed.

## 2018-10-13 ENCOUNTER — Telehealth: Payer: Self-pay | Admitting: Gastroenterology

## 2018-10-13 NOTE — Telephone Encounter (Signed)
Spoke with Catalina Antigua, [facility] pharmacy, and explained that we have made several attempts to send pt prescription via fax as requested but the fax is failing to communicate. I was able to give prescription directions verbally.

## 2018-10-13 NOTE — Telephone Encounter (Signed)
Matt a pharmacyst from the [place] left vm to request rx Entivio 300 mg  His fax # [phone] and his cb [phone]

## 2018-10-17 ENCOUNTER — Encounter: Payer: Self-pay | Admitting: Pharmacy Technician

## 2018-10-17 NOTE — Progress Notes (Signed)
Patient has been approved for drug assistance by [employer] for [employer]. The enrollment period is from 10/12/2018-10/12/2019 based on manufacturer approval. First DOS covered is 11/08/2018.

## 2018-10-24 ENCOUNTER — Telehealth: Payer: Self-pay | Admitting: Gastroenterology

## 2018-10-24 NOTE — Telephone Encounter (Signed)
The dentist office called to see if the could prescribe Augmentin to patient because he has a Fistula draining from his tooth. 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

## 2018-10-24 NOTE — Telephone Encounter (Signed)
Yes augmentin is ok

## 2018-10-25 NOTE — Telephone Encounter (Signed)
Contacted pt dental office an informed that pt is okay to take the Augmentin.

## 2018-11-08 ENCOUNTER — Inpatient Hospital Stay: Payer: Medicare Other

## 2018-11-08 ENCOUNTER — Inpatient Hospital Stay: Payer: Medicare Other | Attending: Internal Medicine | Admitting: Oncology

## 2018-11-08 ENCOUNTER — Encounter: Payer: Self-pay | Admitting: Oncology

## 2018-11-08 VITALS — BP 103/66 | HR 61 | Temp 96.6°F | Resp 16

## 2018-11-08 DIAGNOSIS — K219 Gastro-esophageal reflux disease without esophagitis: Secondary | ICD-10-CM | POA: Diagnosis not present

## 2018-11-08 DIAGNOSIS — I252 Old myocardial infarction: Secondary | ICD-10-CM | POA: Insufficient documentation

## 2018-11-08 DIAGNOSIS — Z7984 Long term (current) use of oral hypoglycemic drugs: Secondary | ICD-10-CM | POA: Diagnosis not present

## 2018-11-08 DIAGNOSIS — G8929 Other chronic pain: Secondary | ICD-10-CM | POA: Diagnosis not present

## 2018-11-08 DIAGNOSIS — K51919 Ulcerative colitis, unspecified with unspecified complications: Secondary | ICD-10-CM

## 2018-11-08 DIAGNOSIS — E119 Type 2 diabetes mellitus without complications: Secondary | ICD-10-CM | POA: Diagnosis not present

## 2018-11-08 DIAGNOSIS — Z79899 Other long term (current) drug therapy: Secondary | ICD-10-CM

## 2018-11-08 DIAGNOSIS — I2510 Atherosclerotic heart disease of native coronary artery without angina pectoris: Secondary | ICD-10-CM | POA: Insufficient documentation

## 2018-11-08 DIAGNOSIS — Z7982 Long term (current) use of aspirin: Secondary | ICD-10-CM | POA: Insufficient documentation

## 2018-11-08 DIAGNOSIS — Z85820 Personal history of malignant melanoma of skin: Secondary | ICD-10-CM | POA: Diagnosis not present

## 2018-11-08 DIAGNOSIS — R5383 Other fatigue: Secondary | ICD-10-CM | POA: Diagnosis not present

## 2018-11-08 DIAGNOSIS — M47816 Spondylosis without myelopathy or radiculopathy, lumbar region: Secondary | ICD-10-CM | POA: Insufficient documentation

## 2018-11-08 DIAGNOSIS — K51511 Left sided colitis with rectal bleeding: Secondary | ICD-10-CM

## 2018-11-08 DIAGNOSIS — Z8249 Family history of ischemic heart disease and other diseases of the circulatory system: Secondary | ICD-10-CM | POA: Insufficient documentation

## 2018-11-08 MED ORDER — SODIUM CHLORIDE 0.9 % IV SOLN
Freq: Once | INTRAVENOUS | Status: AC
  Administered 2018-11-08: 10:00:00 via INTRAVENOUS
  Filled 2018-11-08: qty 250

## 2018-11-08 MED ORDER — VEDOLIZUMAB 300 MG IV SOLR
300.0000 mg | Freq: Once | INTRAVENOUS | Status: AC
  Administered 2018-11-08: 300 mg via INTRAVENOUS
  Filled 2018-11-08: qty 5

## 2018-11-08 NOTE — Progress Notes (Signed)
[place] OFFICE PROGRESS NOTE  Patient Care Team: Kirk Ruths, MD as PCP - General (Internal Medicine) [name], MD as Consulting Physician (Surgery)  Cancer Staging No matching staging information was found for the patient.    No history exists.   # Ulcerative Colitis [chronic]- infliximab [poor tol- Arthritis-stopped 1194]; Aug 2018- Entiviyo  # CAD [Dr.Fath; conservative measures]  INTERVAL HISTORY:  David [name] 73 y.o.  male pleasant patient Long-standing history of ulcerative colitis- moderate to severe followed by Dr. Jerrilyn Cairo.  He is currently receiving Entyvio every 8 weeks and is tolerating well.  Patient prescribed Augmentin 2 weeks ago for dental infection/fistula.  Unfortunately, experienced significant diarrhea.  Stopped Augmentin after 5 days given side effects.  Diarrhea has essentially resolved.  Continues to feel fatigued.  Used Imodium sparingly.  Patient also complains of left hand neuropathy, chronic low back pain and right hip pain.   He refused labs today.  Denies any new lumps or bumps.  REVIEW OF SYSTEMS:  Review of Systems  Constitutional: Positive for malaise/fatigue. Negative for chills, fever and weight loss.  HENT: Negative for congestion, ear pain and tinnitus.   Eyes: Negative.  Negative for blurred vision and double vision.  Respiratory: Negative.  Negative for cough, sputum production and shortness of breath.   Cardiovascular: Negative.  Negative for chest pain, palpitations and leg swelling.  Gastrointestinal: Positive for abdominal pain and diarrhea. Negative for constipation, nausea and vomiting.       With the use of Augmentin.  Improved since stopping.  Genitourinary: Negative for dysuria, frequency and urgency.  Musculoskeletal: Positive for back pain, joint pain and myalgias. Negative for falls.  Skin: Negative.  Negative for rash.  Neurological: Positive for sensory change (Left hand). Negative for  speech change, weakness and headaches.  Endo/Heme/Allergies: Negative.  Does not bruise/bleed easily.  Psychiatric/Behavioral: Negative.  Negative for depression. The patient is not nervous/anxious and does not have insomnia.     PAST MEDICAL HISTORY :  Past Medical History:  Diagnosis Date  . Anemia   . CAD (coronary artery disease) 07/08/2011   Overview:  a. 07/2006: Anterior ST elevation MI. b. 07/2006: PCI with BMS to LAD and RCA. c. 09/2006: Non ST elevation. d. LVEF 30%.  Discussed ICD, not currently interested.   . 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 ([place]) 1/74/0814  . Dyslipidemia 07/30/2011   Overview:  High triglycerides  . GERD (gastroesophageal reflux disease) 07/08/2011  . History of Clostridium difficile colitis   . History of myocardial infarction   . Hyperlipidemia, unspecified 07/08/2011  . Hypothyroidism   . 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 Type II diabetes mellitus, uncontrolled ([place]) 07/08/2011  . Vitamin D deficiency     PAST SURGICAL HISTORY :   Past Surgical History:  Procedure Laterality Date  . CARDIAC CATHETERIZATION    . CHOLECYSTECTOMY    . COLONOSCOPY  07/20/2006   Crohn's disease  . CORONARY ANGIOGRAPHY N/A 07/07/2017   Procedure: CORONARY ANGIOGRAPHY;  Surgeon: Teodoro Spray, MD;  Location: [facility];  Service: Cardiovascular;  Laterality: N/A;  . CORONARY STENT PLACEMENT    . FLEXIBLE SIGMOIDOSCOPY N/A 09/25/2016   Procedure: FLEXIBLE SIGMOIDOSCOPY;  Surgeon: [name], MD;  Location: [facility] ENDOS[facility];  Service: Endoscopy;  Laterality: N/A;  . LEFT HEART CATH Left 07/07/2017   Procedure: Left Heart Cath;  Surgeon: Teodoro Spray, MD;  Location: [facility];  Service: Cardiovascular;  Laterality: Left;  .  MELANOMA REMOVED    . parotid gland removal      FAMILY HISTORY :   Family History  Problem Relation Age of Onset  . Heart disease Mother   . Heart attack Father   . Heart  disease Sister   . Heart disease Brother     SOCIAL HISTORY:   Social History   Tobacco Use  . Smoking status: Never Smoker  . Smokeless tobacco: Never Used  Substance Use Topics  . Alcohol use: No  . Drug use: No    ALLERGIES:  is allergic to mesalamine.  MEDICATIONS:  Current Outpatient Medications  Medication Sig Dispense Refill  . ACCU-CHEK AVIVA PLUS test strip 1 each by Other route as needed for other.     Marland Kitchen ACCU-CHEK SOFTCLIX LANCETS lancets USE 2 TIMES DAILY. USE AS INSTRUCTED.    Marland Kitchen aspirin EC 81 MG tablet Take 81 mg by mouth daily.     Marland Kitchen atorvastatin (LIPITOR) 40 MG tablet Take 40 mg by mouth every morning.     . calcium carbonate (TUMS EX) 750 MG chewable tablet Chew 1 tablet by mouth daily as needed for heartburn.    . carvedilol (COREG) 3.125 MG tablet Take 3.125 mg by mouth 2 (two) times daily with a meal.     . Cholecalciferol (VITAMIN D) 2000 units CAPS Take 1 capsule by mouth 2 (two) times daily.     . clopidogrel (PLAVIX) 75 MG tablet Take by mouth.    . gabapentin (NEURONTIN) 100 MG capsule Take 1 capsule by mouth 2 (two) times daily.   11  . gabapentin (NEURONTIN) 300 MG capsule Take 300 mg by mouth at bedtime.    Marland Kitchen glipiZIDE (GLUCOTROL) 10 MG tablet Take 10 mg by mouth 2 (two) times daily.     Marland Kitchen HYDROcodone-acetaminophen (NORCO/VICODIN) 5-325 MG tablet May take  1 tablet  every12 hours , if more pain control is needed, 30 tablets    . levothyroxine (SYNTHROID, LEVOTHROID) 50 MCG tablet Take 50 mcg by mouth daily before breakfast.     . Multiple Vitamins-Minerals (MULTIVITAMIN ADULT PO) Take 1 tablet by mouth daily.     . Omega-3 Fatty Acids (FISH OIL PO) Take 1 capsule by mouth 2 (two) times daily.     Marland Kitchen omeprazole (PRILOSEC) 20 MG capsule Take by mouth.    . pantoprazole (PROTONIX) 40 MG tablet Take 1 tablet by mouth daily.    . Probiotic Product (PROBIOTIC DAILY PO) Take 1 capsule by mouth daily.     Marland Kitchen pyridOXINE (VITAMIN B-6) 100 MG tablet Take 100 mg by  mouth daily.    . sacubitril-valsartan (ENTRESTO) 24-26 MG Take 1 tablet by mouth 2 (two) times daily.     . vedolizumab (ENTYVIO) 300 MG injection Inject into the vein. Every 8 weeks 1 vial 6  . vitamin B-12 (CYANOCOBALAMIN) 1000 MCG tablet Take 1,000 mcg by mouth daily.     Current Facility-Administered Medications  Medication Dose Route Frequency Provider Last Rate Last Dose  . inFLIXimab (REMICADE) 5 mg/kg = 400 mg in sodium chloride 0.9 % 250 mL infusion  5 mg/kg (Order-Specific) Intravenous Q21 days [name], MD       Facility-Administered Medications Ordered in Other Visits  Medication Dose Route Frequency Provider Last Rate Last Dose  . vedolizumab (ENTYVIO) 300 mg in sodium chloride 0.9 % 250 mL infusion  300 mg Intravenous Once Charlaine Dalton R, MD 500 mL/hr at 11/08/18 1029 300 mg at 11/08/18 1029  PHYSICAL EXAMINATION: ECOG PERFORMANCE STATUS: 0 - Asymptomatic  BP 127/79 (BP Location: Left Arm, Patient Position: Sitting, Cuff Size: Normal)   Pulse 75   Temp 98.3 F (36.8 C) (Tympanic)   Resp 16   Wt 196 lb 3.4 oz (89 kg)   BMI 26.61 kg/m   Filed Weights   11/08/18 0947  Weight: 196 lb 3.4 oz (89 kg)    Physical Exam Constitutional:      Appearance: Normal appearance.  HENT:     Head: Normocephalic and atraumatic.  Eyes:     Pupils: Pupils are equal, round, and reactive to light.  Neck:     Musculoskeletal: Normal range of motion.  Cardiovascular:     Rate and Rhythm: Normal rate and regular rhythm.     Heart sounds: Normal heart sounds. No murmur.  Pulmonary:     Effort: Pulmonary effort is normal.     Breath sounds: Normal breath sounds. No wheezing.  Abdominal:     General: Bowel sounds are normal. There is no distension.     Palpations: Abdomen is soft.     Tenderness: There is no abdominal tenderness.  Musculoskeletal: Normal range of motion.  Skin:    General: Skin is warm and dry.     Findings: No rash.  Neurological:     Mental  Status: He is alert and oriented to person, place, and time.  Psychiatric:        Judgment: Judgment normal.      LABORATORY DATA:  I have reviewed the data as listed    Component Value Date/Time   NA 146 (H) 09/22/2018 1409   K 4.4 09/22/2018 1409   CL 103 09/22/2018 1409   CO2 21 09/22/2018 1409   GLUCOSE 153 (H) 09/22/2018 1409   GLUCOSE 89 09/24/2017 0957   BUN 10 09/22/2018 1409   CREATININE 0.94 09/22/2018 1409   CALCIUM 9.2 09/22/2018 1409   PROT 6.5 09/22/2018 1409   ALBUMIN 4.3 09/22/2018 1409   AST 32 09/22/2018 1409   ALT 26 09/22/2018 1409   ALKPHOS 81 09/22/2018 1409   BILITOT 0.7 09/22/2018 1409   GFRNONAA 81 09/22/2018 1409   GFRAA 93 09/22/2018 1409    No results found for: SPEP, UPEP  Lab Results  Component Value Date   WBC 6.0 09/22/2018   NEUTROABS 3.7 09/22/2018   HGB 12.3 (L) 09/22/2018   HCT 35.1 (L) 09/22/2018   MCV 90 09/22/2018   PLT 276 09/22/2018      Chemistry      Component Value Date/Time   NA 146 (H) 09/22/2018 1409   K 4.4 09/22/2018 1409   CL 103 09/22/2018 1409   CO2 21 09/22/2018 1409   BUN 10 09/22/2018 1409   CREATININE 0.94 09/22/2018 1409      Component Value Date/Time   CALCIUM 9.2 09/22/2018 1409   ALKPHOS 81 09/22/2018 1409   AST 32 09/22/2018 1409   ALT 26 09/22/2018 1409   BILITOT 0.7 09/22/2018 1409       RADIOGRAPHIC STUDIES: I have personally reviewed the radiological images as listed and agreed with the findings in the report. No results found.   ASSESSMENT & PLAN:  Ulcerative colitis with complication ([place]) # Mod-Severe ulcerative colitis-on Entyvio currently on every 8 week schedule.  Appears to be tolerating well.  Stable; refused blood work today.  Blood work from 09/22/2018 from Dr. Georgeann Oppenheim office was normal. Refused DEXA bone scan, refused vaccination for hepatitis A/B and refused colonoscopy.    #  Lumbar spondylosis: Followed by Dr. Lubertha Sayres.  Previously had MRI revealing generative disc disease,  mild foraminal narrowing L3-L4, history of left hemilaminectomy L4-L5 and a disc bulge at L5-S1.   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 Augmentin unfortunately caused significant diarrhea.  Diarrhea has resolved since stopping antibiotic.  Scheduled to have root canal next week.  Patient concerned additional antibiotics will be needed post root canal and he is not keen on trialing additional antibiotics.  States his dentist will be in touch with Dr. Vicente Males  Plan RTC every 8 weeks for Entyvio.  RTC in 6 months for Weyman Rodney, MD assessment with labs.        No orders of the defined types were placed in this encounter.  All questions were answered. The patient knows to call the clinic with any problems, questions or concerns.   Greater than 50% was spent in counseling and coordination of care with this patient including but not limited to discussion of the relevant topics above (See A&P) including, but not limited to diagnosis and management of acute and chronic medical conditions.      Jacquelin Hawking, NP 11/08/2018 10:54 AM

## 2018-11-08 NOTE — Assessment & Plan Note (Addendum)
#   Mod-Severe ulcerative colitis-on Entyvio currently on every 8 week schedule.  Appears to be tolerating well.  Stable; refused blood work today.  Blood work from 09/22/2018 from Dr. Georgeann Oppenheim office was normal. Refused DEXA bone scan, refused vaccination for hepatitis A/B and refused colonoscopy.    #Lumbar spondylosis: Followed by Dr. Lubertha Sayres.  Previously had MRI revealing generative disc disease, mild foraminal narrowing L3-L4, history of left hemilaminectomy L4-L5 and a disc bulge at L5-S1.   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 Augmentin unfortunately caused significant diarrhea.  Diarrhea has resolved since stopping antibiotic.  Scheduled to have root canal next week.  Patient concerned additional antibiotics will be needed post root canal and he is not keen on trialing additional antibiotics.  States his dentist will be in touch with Dr. Vicente Males  Plan RTC every 8 weeks for Entyvio.  RTC in 6 months for Weyman Rodney, MD assessment with labs.

## 2018-11-08 NOTE — Patient Instructions (Signed)
Vedolizumab injection solution What is this medicine? VEDOLIZUMAB (Ve doe LIZ you mab) is used to treat ulcerative colitis and Crohn's disease in adult patients. This medicine may be used for other purposes; ask your health care provider or pharmacist if you have questions. COMMON BRAND NAME(S): Entyvio What should I tell my health care provider before I take this medicine? They need to know if you have any of these conditions: -diabetes -hepatitis B or history of hepatitis B infection -HIV or AIDS -immune system problems -infection or history of infections -liver disease -recently received or scheduled to receive a vaccine -scheduled to have surgery -tuberculosis, a positive skin test for tuberculosis or have recently been in close contact with someone who has tuberculosis - an unusual or allergic reaction to vedolizumab, other medicines, foods, dyes, or preservatives -pregnant or trying to get pregnant -breast-feeding How should I use this medicine? This medicine is for infusion into a vein. It is given by a health care professional in a hospital or clinic setting. A special MedGuide will be given to you by the pharmacist with each prescription and refill. Be sure to read this information carefully each time. Talk to your pediatrician regarding the use of this medicine in children. This medicine is not approved for use in children. Overdosage: If you think you have taken too much of this medicine contact a poison control center or emergency room at once. NOTE: This medicine is only for you. Do not share this medicine with others. What if I miss a dose? It is important not to miss your dose. Call your doctor or health care professional if you are unable to keep an appointment. What may interact with this medicine? -steroid medicines like prednisone or cortisone -TNF-alpha inhibitors like natalizumab, adalimumab, and infliximab -vaccines This list may not describe all possible  interactions. Give your health care provider a list of all the medicines, herbs, non-prescription drugs, or dietary supplements you use. Also tell them if you smoke, drink alcohol, or use illegal drugs. Some items may interact with your medicine. What should I watch for while using this medicine? Your condition will be monitored carefully while you are receiving this medicine. Visit your doctor for regular check ups. Tell your doctor or healthcare professional if your symptoms do not start to get better or if they get worse. Stay away from people who are sick. Call your doctor or health care professional for advice if you get a fever, chills or sore throat, or other symptoms of a cold or flu. Do not treat yourself. In some patients, this medicine may cause a serious brain infection that may cause death. If you have any problems seeing, thinking, speaking, walking, or standing, tell your doctor right away. If you cannot reach your doctor, get urgent medical care. What side effects may I notice from receiving this medicine? Side effects that you should report to your doctor or health care professional as soon as possible: -allergic reactions like skin rash, itching or hives, swelling of the face, lips, or tongue -breathing problems -changes in vision -chest pain -dark urine -depression, feelings of sadness -dizziness -general ill feeling or flu-like symptoms -irregular, missed, or painful menstrual periods -light-colored stools -loss of appetite, nausea -muscle weakness -problems with balance, talking, or walking -right upper belly pain -unusually weak or tired -yellowing of the eyes or skin Side effects that usually do not require medical attention (report to your doctor or health care professional if they continue or are bothersome): -aches, pains -headache -  stomach upset -tiredness This list may not describe all possible side effects. Call your doctor for medical advice about side  effects. You may report side effects to FDA at 1-800-FDA-1088. Where should I keep my medicine? This drug is given in a hospital or clinic and will not be stored at home. NOTE: This sheet is a summary. It may not cover all possible information. If you have questions about this medicine, talk to your doctor, pharmacist, or health care provider.  2019 Elsevier/Gold Standard (2015-10-31 08:36:51)

## 2019-01-02 ENCOUNTER — Other Ambulatory Visit: Payer: Self-pay

## 2019-01-03 ENCOUNTER — Inpatient Hospital Stay: Payer: Medicare Other | Attending: Hematology and Oncology

## 2019-01-03 VITALS — BP 93/56 | HR 65 | Temp 95.0°F | Resp 18

## 2019-01-03 DIAGNOSIS — K51919 Ulcerative colitis, unspecified with unspecified complications: Secondary | ICD-10-CM

## 2019-01-03 MED ORDER — VEDOLIZUMAB 300 MG IV SOLR
300.0000 mg | Freq: Once | INTRAVENOUS | Status: AC
  Administered 2019-01-03: 300 mg via INTRAVENOUS
  Filled 2019-01-03: qty 5

## 2019-01-03 MED ORDER — SODIUM CHLORIDE 0.9 % IV SOLN
Freq: Once | INTRAVENOUS | Status: AC
  Administered 2019-01-03: 11:00:00 via INTRAVENOUS
  Filled 2019-01-03: qty 250

## 2019-02-27 ENCOUNTER — Other Ambulatory Visit: Payer: Self-pay

## 2019-02-28 ENCOUNTER — Inpatient Hospital Stay: Payer: Medicare Other | Attending: Hematology and Oncology

## 2019-02-28 VITALS — BP 108/67 | HR 64 | Temp 95.5°F | Resp 18

## 2019-02-28 DIAGNOSIS — K51919 Ulcerative colitis, unspecified with unspecified complications: Secondary | ICD-10-CM | POA: Diagnosis present

## 2019-02-28 MED ORDER — VEDOLIZUMAB 300 MG IV SOLR
300.0000 mg | Freq: Once | INTRAVENOUS | Status: AC
  Administered 2019-02-28: 300 mg via INTRAVENOUS
  Filled 2019-02-28: qty 5

## 2019-02-28 MED ORDER — SODIUM CHLORIDE 0.9 % IV SOLN
Freq: Once | INTRAVENOUS | Status: AC
  Administered 2019-02-28: 11:00:00 via INTRAVENOUS
  Filled 2019-02-28: qty 250

## 2019-04-19 ENCOUNTER — Other Ambulatory Visit: Payer: Self-pay

## 2019-04-19 DIAGNOSIS — K51919 Ulcerative colitis, unspecified with unspecified complications: Secondary | ICD-10-CM

## 2019-04-20 NOTE — Progress Notes (Signed)
[facility]  [address], [address] [place], [place] [address] Phone: [phone]  Fax: [phone]   Clinic Day:  04/25/2019  Referring physician: Kirk Ruths, MD  Chief Complaint: David [name] is a 73 y.o. 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  HPI: The patient was diagnosed with ulcerative colitis in 07/2006. 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  He has been followed by Dr Vicente Males in gastroenterology since 09/2016.  Sigmoidoscopy in 09/25/2016 revealed moderate to severe left-sided colitis.  He did not tolerate mesalamine.  He was treated with steroids.  Abdomen and pelvic CT on 09/28/2016 revealed diffuse colonic wall thickening from the proximal transverse colon to the rectum.  He was admitted on 10/27/2016 with E. coli sepsis.  He restarted Remicade on 01/05/2017.  He tolerated Remicade (infliximab) poorly secondary to joint and muscle pain.  He was initially seen by Dr. Rogue Bussing on 06/08/2017.  Notes reviewed.  Weyman Rodney was initiated (every 2 weeks x 1 month, monthly x 1 then every 2 months).  The patient has received Entyvio on 06/08/2017, 06/25/2017, 07/23/2017, 09/29/2017, 12/07/2017, 02/01/2018, 03/29/2018, 05/24/2018, 07/19/2018, 09/13/2018, 11/08/2018, 01/03/2019, 02/28/2019.   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   The patient has a history of coronary artery disease s/p LAD stent, mid RCA occlusion and left ventricular thrombus. Cardiac MRI on 08/25/2017 revealed moderately dilated left ventricle with normal wall thickness and severely decreased systolic function (LVEF = 39%). There was  akinesis of the mid inferior, inferolateral, anterior, anteroseptal and apical inferior, anterior and septal walls.   Thoracic spine MRI on 04/11/2018 revealed chronic T12 vertebral body compression fracture. Lumbar spine MRI on 05/15/2018 revealed degenerative disc disease and degenerative facet disease throughout the lumbar region without advanced finding.  There was mild foraminal narrowing L3-L4, history of left hemilaminectomy L4-L5 and a disc bulge at L5-S1.  The patient was last seen in the medical oncology clinic by Faythe Casa, NP on 11/08/2018. The patient stopped Augmentin for dental infection after significant diarrhea. He felt fatigued. He was using Imodium sparingly. He also noted left hand neuropathy, chronic low back pain and right hip pain.  Outside labs drawn on 02/23/2019: Hemoglobin 12.5, hematocrit 36.0, MCV 91.8, platelets 248,000, WBC 8,200. Neutrophils 5.31. Protein 6.5. Albumin 4.1.   Symptomatically, the patient reports feeling okay. He reports normal bowel movements. He denies any blood in his stool, and melena. He denies any fever, and sweats. He denies any dental problems.   He denies having any chest pains or concerns for his CAD. The patient is not on any anticoagulation. His blood pressure is 168/95 in the clinic today. His weight is down 6 lbs.   He refused blood work today in the clinic after Dr. Vicente Males requested it.  He refused to have his mouth checked during the physical exam. The patient reports not understanding the reason for this visit.  He notes not seeing a reason to answer any questions.    Past Medical History:  Diagnosis Date  . Anemia   . CAD (coronary artery disease) 07/08/2011   Overview:  a. 07/2006: Anterior ST elevation MI. b. 07/2006: PCI with BMS to LAD and RCA. c. 09/2006: Non ST elevation. d. LVEF 30%.  Discussed  ICD, not currently interested.   . 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 ([place]) 2/29/7989  . Dyslipidemia  07/30/2011   Overview:  High triglycerides  . GERD (gastroesophageal reflux disease) 07/08/2011  . History of Clostridium difficile colitis   . History of myocardial infarction   . Hyperlipidemia, unspecified 07/08/2011  . Hypothyroidism   . 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 Type II diabetes mellitus, uncontrolled ([place]) 07/08/2011  . Vitamin D deficiency     Past Surgical History:  Procedure Laterality Date  . CARDIAC CATHETERIZATION    . CHOLECYSTECTOMY    . COLONOSCOPY  07/20/2006   Crohn's disease  . CORONARY ANGIOGRAPHY N/A 07/07/2017   Procedure: CORONARY ANGIOGRAPHY;  Surgeon: Teodoro Spray, MD;  Location: [facility];  Service: Cardiovascular;  Laterality: N/A;  . CORONARY STENT PLACEMENT    . FLEXIBLE SIGMOIDOSCOPY N/A 09/25/2016   Procedure: FLEXIBLE SIGMOIDOSCOPY;  Surgeon: [name], MD;  Location: [facility];  Service: Endoscopy;  Laterality: N/A;  . LEFT HEART CATH Left 07/07/2017   Procedure: Left Heart Cath;  Surgeon: Teodoro Spray, MD;  Location: [facility];  Service: Cardiovascular;  Laterality: Left;  Marland Kitchen MELANOMA REMOVED    . parotid gland removal      Family History  Problem Relation Age of Onset  . Heart disease Mother   . Heart attack Father   . Heart disease Sister   . Heart disease Brother     Social History:  reports that he has never smoked. He has never used smokeless tobacco. He reports that he does not drink alcohol or use drugs.  He lives in [place].  The patient is alone today.  Allergies:  Allergies  Allergen Reactions  . Mesalamine Nausea Only and Nausea And Vomiting    Current Medications: Current Outpatient Medications  Medication Sig Dispense Refill  . ACCU-CHEK AVIVA PLUS test strip 1 each by Other route as needed for other.     Marland Kitchen ACCU-CHEK SOFTCLIX LANCETS lancets USE 2 TIMES DAILY. USE AS INSTRUCTED.    Marland Kitchen aspirin EC 81 MG tablet Take 81 mg by mouth daily.     Marland Kitchen atorvastatin  (LIPITOR) 40 MG tablet Take 40 mg by mouth 2 (two) times a day.     . carvedilol (COREG) 3.125 MG tablet Take 3.125 mg by mouth 2 (two) times daily with a meal.     . Cholecalciferol (D 5000) 125 MCG (5000 UT) capsule Take 5,000 Units by mouth daily.     . clopidogrel (PLAVIX) 75 MG tablet TAKE 1 TABLET BY MOUTH ONCE DAILY    . gabapentin (NEURONTIN) 300 MG capsule Take 300 mg by mouth 3 (three) times daily.     Marland Kitchen glipiZIDE (GLUCOTROL) 10 MG tablet Take 10 mg by mouth 2 (two) times daily.     Marland Kitchen levothyroxine (SYNTHROID, LEVOTHROID) 50 MCG tablet Take 50 mcg by mouth daily before breakfast.     . Multiple Vitamins-Minerals (CENTRUM SILVER 50+MEN PO) Take by mouth.    . Omega-3 Fatty Acids (FISH OIL PO) Take 1,200 mg by mouth 2 (two) times daily.     Marland Kitchen omeprazole (PRILOSEC) 20 MG capsule Take 20 mg by mouth daily.     . Probiotic Product (PROBIOTIC DAILY PO) Take 1 capsule by mouth daily.     Marland Kitchen pyridOXINE (VITAMIN B-6) 100 MG tablet Take 100 mg by mouth daily.    . vedolizumab (ENTYVIO) 300 MG injection Inject into the vein. Every 8  weeks 1 vial 6  . vitamin B-12 (CYANOCOBALAMIN) 1000 MCG tablet Take 1,000 mcg by mouth daily.     Current Facility-Administered Medications  Medication Dose Route Frequency Provider Last Rate Last Dose  . inFLIXimab (REMICADE) 5 mg/kg = 400 mg in sodium chloride 0.9 % 250 mL infusion  5 mg/kg (Order-Specific) Intravenous Q21 days [name], MD        Review of Systems  Constitutional: Positive for weight loss (down 6 lbs.). Negative for chills, fever and malaise/fatigue.       Feels okay.  HENT: Negative for congestion, ear pain, hearing loss, sore throat and tinnitus.   Eyes: Negative.  Negative for blurred vision and double vision.  Respiratory: Negative.  Negative for cough, hemoptysis, sputum production and shortness of breath.   Cardiovascular: Negative.  Negative for chest pain, palpitations and leg swelling.  Gastrointestinal: Negative for abdominal  pain, blood in stool, constipation, diarrhea, heartburn, nausea and vomiting.  Genitourinary: Negative for dysuria, frequency, hematuria and urgency.  Musculoskeletal: Negative for back pain, falls, joint pain and myalgias.  Skin: Negative.  Negative for rash.  Neurological: Negative for dizziness, tingling, sensory change, speech change, weakness and headaches.  Endo/Heme/Allergies: Negative.  Does not bruise/bleed easily.  Psychiatric/Behavioral: Negative.  Negative for depression and memory loss. The patient is not nervous/anxious and does not have insomnia.   All other systems reviewed and are negative.  Performance status (ECOG): 0  Vital Signs Blood pressure (!) 168/95, pulse 73, temperature 98 F (36.7 C), temperature source Oral, resp. rate 18, weight 190 lb 7.6 oz (86.4 kg), SpO2 100 %.  Physical Exam  Constitutional: He is oriented to person, place, and time. He appears well-developed and well-nourished. No distress.  HENT:  Head: Normocephalic and atraumatic.  Short gray hair.   Eyes: Pupils are equal, round, and reactive to light. Conjunctivae and EOM are normal. No scleral icterus.  Glasses. Mask.  Neurological: He is alert and oriented to person, place, and time.  Skin: He is not diaphoretic.  Psychiatric: Judgment and thought content normal.  He does not see the reason for an evaluation.  Nursing note and vitals reviewed.   No visits with results within 3 Day(s) from this visit.  Latest known visit with results is:  Orders Only on 09/22/2018  Component Date Value Ref Range Status  . QuantiFERON Incubation 09/22/2018 Incubation performed.   Final  . QuantiFERON Criteria 09/22/2018 Comment   Final   Comment: The QuantiFERON-TB Gold Plus result is determined by subtracting the Nil value from either TB antigen (Ag) tube. The mitogen tube serves as a control for the test.   . QuantiFERON TB1 Ag Value 09/22/2018 0.03  IU/mL Final  . QuantiFERON TB2 Ag Value 09/22/2018  0.01  IU/mL Final  . QuantiFERON Nil Value 09/22/2018 0.03  IU/mL Final  . QuantiFERON Mitogen Value 09/22/2018 >10.00  IU/mL Final  . QuantiFERON-TB Gold Plus 09/22/2018 Negative  Negative Final    Assessment:  David [name] is a 73 y.o. 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  He has been treated with cyclosporine (09/2006), Remicade (09/2006 - 2009; 01/05/2017), steroids, and Entyvio.    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  He did not tolerate mesalamine.  He developed joint and muscle pain on Remicade.  The patient has received Entyvio on 06/08/2017, 06/25/2017, 07/23/2017, 09/29/2017, 12/07/2017, 02/01/2018, 03/29/2018, 05/24/2018, 07/19/2018, 09/13/2018, 11/08/2018, 01/03/2019, 02/28/2019.  Sigmoidoscopy in 09/25/2016 revealed moderate to severe  left-sided colitis.    Abdomen and pelvic CT on 09/28/2016 revealed diffuse colonic wall thickening from the proximal transverse colon to the rectum.  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   The patient has coronary artery disease s/p LAD stent, mid RCA occlusion and left ventricular thrombus. Cardiac MRI on 08/25/2017 revealed moderately dilated left ventricle with normal wall thickness and severely decreased systolic function (LVEF = 39%). There was akinesis of the mid inferior, inferolateral, anterior, anteroseptal and apical inferior, anterior and septal walls.   Symptomatically, he denies any complaints.  Plan: 1.   Labs today:  CBC with diff, CMP. 2.   Moderate to severe ulcerative colitis  Review entire medical history, diagnosis and management of ulcerative colitis.   Review treatment with Entyvio every 8 weeks.  Review role of the infusion center.  Discuss treatment directed by Dr Vicente Males. 3.   Patient  declined labs today. 4.   Patient to schedule follow-up for [facility] after he sees Dr Vicente Males.   I discussed the assessment and treatment plan with the patient.  The patient was provided an opportunity to ask questions and all were answered.  The patient agreed with the plan and demonstrated an understanding of the instructions.  The patient was advised to call back if the symptoms worsen or if the condition fails to improve as anticipated.  I provided 15 minutes of face-to-face time during this this encounter and > 50% was spent counseling as documented under my assessment and plan.    Melissa C. Mike Gip, MD, PhD    04/25/2019, 10:29 AM  I, Selena Batten, am acting as scribe for [place]. Mike Gip, MD, PhD.  {I, Melissa C. Mike Gip, MD, have reviewed the above documentation for accuracy and completeness, and I agree with the above.

## 2019-04-25 ENCOUNTER — Inpatient Hospital Stay: Payer: Medicare Other | Attending: Hematology and Oncology | Admitting: Hematology and Oncology

## 2019-04-25 ENCOUNTER — Inpatient Hospital Stay: Payer: Medicare Other

## 2019-04-25 ENCOUNTER — Encounter: Payer: Self-pay | Admitting: Hematology and Oncology

## 2019-04-25 ENCOUNTER — Other Ambulatory Visit: Payer: Self-pay

## 2019-04-25 VITALS — BP 168/95 | HR 73 | Temp 98.0°F | Resp 18 | Wt 190.5 lb

## 2019-04-25 DIAGNOSIS — I2510 Atherosclerotic heart disease of native coronary artery without angina pectoris: Secondary | ICD-10-CM | POA: Diagnosis not present

## 2019-04-25 DIAGNOSIS — G8929 Other chronic pain: Secondary | ICD-10-CM | POA: Diagnosis not present

## 2019-04-25 DIAGNOSIS — Z7982 Long term (current) use of aspirin: Secondary | ICD-10-CM | POA: Diagnosis not present

## 2019-04-25 DIAGNOSIS — Z7984 Long term (current) use of oral hypoglycemic drugs: Secondary | ICD-10-CM | POA: Insufficient documentation

## 2019-04-25 DIAGNOSIS — I252 Old myocardial infarction: Secondary | ICD-10-CM | POA: Diagnosis not present

## 2019-04-25 DIAGNOSIS — Z85820 Personal history of malignant melanoma of skin: Secondary | ICD-10-CM | POA: Diagnosis not present

## 2019-04-25 DIAGNOSIS — E1142 Type 2 diabetes mellitus with diabetic polyneuropathy: Secondary | ICD-10-CM | POA: Diagnosis not present

## 2019-04-25 DIAGNOSIS — K219 Gastro-esophageal reflux disease without esophagitis: Secondary | ICD-10-CM | POA: Diagnosis not present

## 2019-04-25 DIAGNOSIS — R634 Abnormal weight loss: Secondary | ICD-10-CM

## 2019-04-25 DIAGNOSIS — Z79899 Other long term (current) drug therapy: Secondary | ICD-10-CM | POA: Insufficient documentation

## 2019-04-25 DIAGNOSIS — Z955 Presence of coronary angioplasty implant and graft: Secondary | ICD-10-CM

## 2019-04-25 DIAGNOSIS — K51919 Ulcerative colitis, unspecified with unspecified complications: Secondary | ICD-10-CM | POA: Insufficient documentation

## 2019-04-25 DIAGNOSIS — E785 Hyperlipidemia, unspecified: Secondary | ICD-10-CM | POA: Diagnosis not present

## 2019-04-25 NOTE — Progress Notes (Signed)
Pt here for follow up. Denies any concerns.  

## 2019-04-26 ENCOUNTER — Telehealth: Payer: Self-pay | Admitting: Gastroenterology

## 2019-04-26 NOTE — Telephone Encounter (Signed)
Pt is calling he was informed  By his cancer doctor  he had to do blood work before receiving his Entivio treatment he needs to know  What he is supposed to do we scheduled him an apt for 05/09/19 due to Dr. Vicente Males being on call next week please cal pt for advise

## 2019-04-27 ENCOUNTER — Other Ambulatory Visit: Payer: Self-pay

## 2019-04-27 ENCOUNTER — Telehealth: Payer: Self-pay | Admitting: Gastroenterology

## 2019-04-27 DIAGNOSIS — K5100 Ulcerative (chronic) pancolitis without complications: Secondary | ICD-10-CM

## 2019-04-27 DIAGNOSIS — K51919 Ulcerative colitis, unspecified with unspecified complications: Secondary | ICD-10-CM

## 2019-04-27 NOTE — Telephone Encounter (Signed)
Pt is calling for [place] regarding Lab work he wanted to have all Labs done in our office Dr. Mike Gip order Labs as well he wanted them done all in one place please call pt

## 2019-04-27 NOTE — Telephone Encounter (Signed)
David [name] he can see me on 7/28- can we find out if he has labs when he sees me when he can get the infusion with the cancer center. Do let David [name] know that - delaying infusion can increase risk of flares   Dr [name] MD,MRCP [facility]) Gastroenterology/Hepatology Pager: [phone]

## 2019-04-27 NOTE — Telephone Encounter (Signed)
  As long as we have his labs, we can schedule him for treatment.  [name], can you schedule?  M

## 2019-04-28 ENCOUNTER — Other Ambulatory Visit
Admission: RE | Admit: 2019-04-28 | Discharge: 2019-04-28 | Disposition: A | Discharge status: Home / Self Care | Payer: Medicare Other | Source: Home / Self Care | Attending: Hematology and Oncology | Admitting: Hematology and Oncology

## 2019-04-28 ENCOUNTER — Other Ambulatory Visit
Admission: RE | Admit: 2019-04-28 | Discharge: 2019-04-28 | Disposition: A | Discharge status: Home / Self Care | Payer: Medicare Other | Source: Ambulatory Visit | Attending: Gastroenterology | Admitting: Gastroenterology

## 2019-04-28 DIAGNOSIS — K51511 Left sided colitis with rectal bleeding: Secondary | ICD-10-CM | POA: Diagnosis present

## 2019-04-28 LAB — CBC WITH DIFFERENTIAL/PLATELET
Abs Immature Granulocytes: 0.02 10*3/uL (ref 0.00–0.07)
Basophils Absolute: 0 10*3/uL (ref 0.0–0.1)
Basophils Relative: 1 %
Eosinophils Absolute: 0.2 10*3/uL (ref 0.0–0.5)
Eosinophils Relative: 3 %
HCT: 38.7 % — ABNORMAL LOW (ref 39.0–52.0)
Hemoglobin: 13.4 g/dL (ref 13.0–17.0)
Immature Granulocytes: 0 %
Lymphocytes Relative: 27 %
Lymphs Abs: 2 10*3/uL (ref 0.7–4.0)
MCH: 31.2 pg (ref 26.0–34.0)
MCHC: 34.6 g/dL (ref 30.0–36.0)
MCV: 90.2 fL (ref 80.0–100.0)
Monocytes Absolute: 0.7 10*3/uL (ref 0.1–1.0)
Monocytes Relative: 9 %
Neutro Abs: 4.6 10*3/uL (ref 1.7–7.7)
Neutrophils Relative %: 60 %
Platelets: 255 10*3/uL (ref 150–400)
RBC: 4.29 MIL/uL (ref 4.22–5.81)
RDW: 12.9 % (ref 11.5–15.5)
WBC: 7.5 10*3/uL (ref 4.0–10.5)
nRBC: 0 % (ref 0.0–0.2)

## 2019-04-28 LAB — COMPREHENSIVE METABOLIC PANEL
ALT: 28 U/L (ref 0–44)
AST: 28 U/L (ref 15–41)
Albumin: 4.2 g/dL (ref 3.5–5.0)
Alkaline Phosphatase: 75 U/L (ref 38–126)
Anion gap: 10 (ref 5–15)
BUN: 15 mg/dL (ref 8–23)
CO2: 24 mmol/L (ref 22–32)
Calcium: 9.3 mg/dL (ref 8.9–10.3)
Chloride: 105 mmol/L (ref 98–111)
Creatinine, Ser: 1 mg/dL (ref 0.61–1.24)
GFR calc Af Amer: >60 mL/min (ref >60)
GFR calc non Af Amer: >60 mL/min (ref >60)
Glucose, Bld: 106 mg/dL — ABNORMAL HIGH (ref 70–99)
Potassium: 4.1 mmol/L (ref 3.5–5.1)
Sodium: 139 mmol/L (ref 135–145)
Total Bilirubin: 1.4 mg/dL — ABNORMAL HIGH (ref 0.3–1.2)
Total Protein: 7.3 g/dL (ref 6.5–8.1)

## 2019-04-28 LAB — C-REACTIVE PROTEIN: CRP: <0.8 mg/dL (ref <1.0)

## 2019-04-28 NOTE — Progress Notes (Signed)
Jadijah inform his HB and CMP are stable

## 2019-04-29 LAB — VITAMIN D 25 HYDROXY (VIT D DEFICIENCY, FRACTURES): Vit D, 25-Hydroxy: 73.4 ng/mL (ref 30.0–100.0)

## 2019-05-01 ENCOUNTER — Telehealth: Payer: Self-pay

## 2019-05-01 NOTE — Progress Notes (Signed)
Inform vitamin D , CRP normal

## 2019-05-01 NOTE — Telephone Encounter (Signed)
Spoke with pt last week and informed him that Dr. Vicente Males has agreed to order his lab test prior to his follow up appointment so that pt is able to Dr. Georgeann Oppenheim and Dr. Kem Parkinson lab test drawn during the same lab visit. Pt plans to visit the hospital lab in the [place].

## 2019-05-01 NOTE — Telephone Encounter (Signed)
Spoke with pt and informed him of lab results.

## 2019-05-01 NOTE — Telephone Encounter (Signed)
-----   Message from [name], MD sent at 05/01/2019  9:21 AM EDT ----- Inform vitamin D , CRP normal

## 2019-05-02 ENCOUNTER — Telehealth: Payer: Self-pay | Admitting: Gastroenterology

## 2019-05-02 NOTE — Telephone Encounter (Signed)
Pt is calling upset and confused about his Entivio Infusion he needs Dr. Vicente Males to approve it and he does not see Dr. Vicente Males until 05/09/19 he needs his Infusion this week please call pt

## 2019-05-03 NOTE — Telephone Encounter (Signed)
Spoke with pt regarding his Entyvio infusion. Pt states when he called [facility] to schedule his infusion appointment for this week but he was told he would need to wait until they've received his lab results. I spoke with Dr. Kem Parkinson nurse who explained that they would need the results faxed and then they will contact the pt to get him scheduled this week. I have faxed over the results.

## 2019-05-05 ENCOUNTER — Other Ambulatory Visit: Payer: Self-pay

## 2019-05-05 ENCOUNTER — Inpatient Hospital Stay: Payer: Medicare Other

## 2019-05-05 VITALS — BP 118/70 | HR 63 | Temp 97.0°F | Resp 18

## 2019-05-05 DIAGNOSIS — K51919 Ulcerative colitis, unspecified with unspecified complications: Secondary | ICD-10-CM

## 2019-05-05 MED ORDER — VEDOLIZUMAB 300 MG IV SOLR
300.0000 mg | Freq: Once | INTRAVENOUS | Status: AC
  Administered 2019-05-05: 09:00:00 300 mg via INTRAVENOUS
  Filled 2019-05-05: qty 5

## 2019-05-05 MED ORDER — SODIUM CHLORIDE 0.9 % IV SOLN
Freq: Once | INTRAVENOUS | Status: AC
  Administered 2019-05-05: 09:00:00 via INTRAVENOUS
  Filled 2019-05-05: qty 250

## 2019-05-09 ENCOUNTER — Ambulatory Visit (INDEPENDENT_AMBULATORY_CARE_PROVIDER_SITE_OTHER): Payer: Medicare Other | Admitting: Gastroenterology

## 2019-05-09 ENCOUNTER — Other Ambulatory Visit: Payer: Self-pay

## 2019-05-09 ENCOUNTER — Encounter: Payer: Self-pay | Admitting: Gastroenterology

## 2019-05-09 VITALS — BP 148/80 | HR 73 | Temp 98.4°F | Ht 72.0 in | Wt 187.2 lb

## 2019-05-09 DIAGNOSIS — K5100 Ulcerative (chronic) pancolitis without complications: Secondary | ICD-10-CM

## 2019-05-09 NOTE — Addendum Note (Signed)
Addended byGlennie Isle E on: 05/09/2019 01:35 PM   Modules accepted: Orders

## 2019-05-09 NOTE — Progress Notes (Signed)
[name] MD, MRCP(U.K) [address]  [place]  [place], [place] [address]  Main: [phone]  Fax: [phone]   Primary Care Physician: Kirk Ruths, MD  Primary Gastroenterologist:  Dr. [name]  Follow up for ulcerative pan colitis  HPI: David [name] is a 73 y.o. male  Summary of history :  Hehas had ulcerative colitis from 2007 . 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 Remicaid d/c in 2009 due to the malignant skin lesions. Since 2009 Not been on any medications. He was being followed by [facility] till 2015 when his doctor retired. Hospitalized in 2011 for flare of the colitis. His symptoms returned in early December 2017when I took over his care . I performed a sigmoidoscopy on 09/25/16 and I noted moderate to severe left sided colitis. Biopsies Confirmed marked active colitis negative for HSV and CMV,Commenced him on mesalamine which he did not tolerate and was admitted to the hospital with symptoms. 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 Restarted on prednisone after a few days of antibiotics. Likely he had the clot secondary to colitis and subsequently the clot may have been infected. We wanted to transfer him to a tertiary center but he refused . He was seen at [facility] Dr Daphane Shepherd who specializes in IBD for a second opinion as he has had prior skin cancer with regards to  options. She saw him on 12/01/16.Commenced on remicaid 01/05/17.3 days after the first infusion had no blood or diarrhea and firming up of stools.Commenced on Entyvio in 05/2017 due to severe reaction   Interval history12/12/19-7/28/2020   He is doing very well he says that he is feeling the best he has ever felt historically.  He says he probably has 1 bowel movement a day some days none absolutely denies any blood in his stool.  Did not have DEXA bone scan( ordered previously ) Labs 04/28/2019: normal vitamin D, cRP CBC,CMP  .Not immune to Hep A/B , Tb test negative in 09/2018 not keen on vaccinations at this point of time He does not want a colonoscopy, well aware discussed at length that he is very high risk for colon cancer.   Was unhappy with recent visit to get his Entyvio infusion.  He would like to avoid coming into the hospital to get his infusion as he is worried about COVID-19 Current Outpatient Medications  Medication Sig Dispense Refill   ACCU-CHEK AVIVA PLUS test strip 1 each by Other route as needed for other.      ACCU-CHEK SOFTCLIX LANCETS lancets USE 2 TIMES DAILY. USE AS INSTRUCTED.     aspirin EC 81 MG tablet Take 81 mg by mouth daily.      atorvastatin (LIPITOR) 40 MG tablet Take 40 mg by mouth 2 (two) times a day.      carvedilol (COREG) 3.125 MG tablet Take 3.125 mg by mouth 2 (two) times daily with a  meal.      Cholecalciferol (D 5000) 125 MCG (5000 UT) capsule Take 5,000 Units by mouth daily.      clopidogrel (PLAVIX) 75 MG tablet TAKE 1 TABLET BY MOUTH ONCE DAILY     gabapentin (NEURONTIN) 300 MG capsule Take 300 mg by mouth 3 (three) times daily.      glipiZIDE (GLUCOTROL) 10 MG tablet Take 10 mg by mouth 2 (two) times daily.      levothyroxine (SYNTHROID, LEVOTHROID) 50 MCG tablet Take 50 mcg by mouth daily before breakfast.      Multiple Vitamins-Minerals (CENTRUM SILVER 50+MEN PO) Take by mouth.     Omega-3 Fatty Acids (FISH OIL PO) Take 1,200  mg by mouth 2 (two) times daily.      omeprazole (PRILOSEC) 20 MG capsule Take 20 mg by mouth daily.      Probiotic Product (PROBIOTIC DAILY PO) Take 1 capsule by mouth daily.      pyridOXINE (VITAMIN B-6) 100 MG tablet Take 100 mg by mouth daily.     vedolizumab (ENTYVIO) 300 MG injection Inject into the vein. Every 8 weeks 1 vial 6   vitamin B-12 (CYANOCOBALAMIN) 1000 MCG tablet Take 1,000 mcg by mouth daily.     Current Facility-Administered Medications  Medication Dose Route Frequency Provider Last Rate Last Dose   inFLIXimab (REMICADE) 5 mg/kg = 400 mg in sodium chloride 0.9 % 250 mL infusion  5 mg/kg (Order-Specific) Intravenous Q21 days [name], MD        Allergies as of 05/09/2019 - Review Complete 04/25/2019  Allergen Reaction Noted   Mesalamine Nausea Only and Nausea And Vomiting 12/01/2016    ROS:  General: Negative for anorexia, weight loss, fever, chills, fatigue, weakness. ENT: Negative for hoarseness, difficulty swallowing , nasal congestion. CV: Negative for chest pain, angina, palpitations, dyspnea on exertion, peripheral edema.  Respiratory: Negative for dyspnea at rest, dyspnea on exertion, cough, sputum, wheezing.  GI: See history of present illness. GU:  Negative for dysuria, hematuria, urinary incontinence, urinary frequency, nocturnal urination.  Endo: Negative for unusual weight change.    Physical Examination:   There were no vitals taken for this visit.  General: Well-nourished, well-developed in no acute distress.  Eyes: No icterus. Conjunctivae pink. Mouth: Oropharyngeal mucosa moist and pink , no lesions erythema or exudate. Prefer no examination as he is worried about COVID  Imaging Studies: No results found.  Assessment and Plan:   David [name] is a 73 y.o. y/o male here to follow up . 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 He was seen by Dr Daphane Shepherd at [facility] who specializes in IBD for a second opinion and we discussed his case after. Options werecolectomy which he was notkeen, use of Vedolizumab which has least possibility of skin cancer. Commenced on infliximab on 01/05/17 . Hadsevere reactions after the infusionsand [facility] .   Clinically in remission, he refuses DEXA bone scan, annual skin exam , immunizations and a colonoscopy . Discussed extensively over prior visits, he is aware of the risk of colon cancer.    Plan  1. CBC,CRP,CMP,vitamin D levels , TB qunt, Hbsag in 09/2019 2. Call my office if he decides to proceed withcolonoscopy for colon cancer  screening and dysplasia screening  3.  Attempt to set up home Entyvio infusions  Dr [name]  MD,MRCP [facility]) Follow up in 6 months

## 2019-05-10 ENCOUNTER — Ambulatory Visit: Payer: Medicare Other

## 2019-06-15 ENCOUNTER — Telehealth: Payer: Self-pay | Admitting: Gastroenterology

## 2019-06-15 NOTE — Telephone Encounter (Signed)
An automated vm system from [employer] left vm to get a call cb [phone]

## 2019-07-07 ENCOUNTER — Inpatient Hospital Stay: Payer: Medicare Other | Attending: Hematology and Oncology

## 2019-07-07 ENCOUNTER — Other Ambulatory Visit: Payer: Self-pay

## 2019-07-07 VITALS — BP 117/70 | HR 66 | Temp 96.1°F | Resp 18

## 2019-07-07 DIAGNOSIS — R634 Abnormal weight loss: Secondary | ICD-10-CM | POA: Diagnosis not present

## 2019-07-07 DIAGNOSIS — Z79899 Other long term (current) drug therapy: Secondary | ICD-10-CM | POA: Diagnosis not present

## 2019-07-07 DIAGNOSIS — Z8249 Family history of ischemic heart disease and other diseases of the circulatory system: Secondary | ICD-10-CM | POA: Diagnosis not present

## 2019-07-07 DIAGNOSIS — K51919 Ulcerative colitis, unspecified with unspecified complications: Secondary | ICD-10-CM

## 2019-07-07 DIAGNOSIS — K5190 Ulcerative colitis, unspecified, without complications: Secondary | ICD-10-CM | POA: Insufficient documentation

## 2019-07-07 DIAGNOSIS — I2510 Atherosclerotic heart disease of native coronary artery without angina pectoris: Secondary | ICD-10-CM | POA: Insufficient documentation

## 2019-07-07 MED ORDER — VEDOLIZUMAB 300 MG IV SOLR
300.0000 mg | Freq: Once | INTRAVENOUS | Status: AC
  Administered 2019-07-07: 300 mg via INTRAVENOUS
  Filled 2019-07-07: qty 5

## 2019-07-07 MED ORDER — SODIUM CHLORIDE 0.9 % IV SOLN
Freq: Once | INTRAVENOUS | Status: AC
  Administered 2019-07-07: 10:00:00 via INTRAVENOUS
  Filled 2019-07-07: qty 250

## 2019-07-07 NOTE — Patient Instructions (Signed)
Vedolizumab injection solution What is this medicine? VEDOLIZUMAB (Ve doe LIZ you mab) is used to treat ulcerative colitis and Crohn's disease in adult patients. This medicine may be used for other purposes; ask your health care provider or pharmacist if you have questions. COMMON BRAND NAME(S): Entyvio What should I tell my health care provider before I take this medicine? They need to know if you have any of these conditions:  diabetes  hepatitis B or history of hepatitis B infection  HIV or AIDS  immune system problems  infection or history of infections  liver disease  recently received or scheduled to receive a vaccine  scheduled to have surgery  tuberculosis, a positive skin test for tuberculosis or have recently been in close contact with someone who has tuberculosis   an unusual or allergic reaction to vedolizumab, other medicines, foods, dyes, or preservatives  pregnant or trying to get pregnant  breast-feeding How should I use this medicine? This medicine is for infusion into a vein. It is given by a health care professional in a hospital or clinic setting. A special MedGuide will be given to you by the pharmacist with each prescription and refill. Be sure to read this information carefully each time. Talk to your pediatrician regarding the use of this medicine in children. This medicine is not approved for use in children. Overdosage: If you think you have taken too much of this medicine contact a poison control center or emergency room at once. NOTE: This medicine is only for you. Do not share this medicine with others. What if I miss a dose? It is important not to miss your dose. Call your doctor or health care professional if you are unable to keep an appointment. What may interact with this medicine?  steroid medicines like prednisone or cortisone  TNF-alpha inhibitors like natalizumab, adalimumab, and infliximab  vaccines This list may not describe all  possible interactions. Give your health care provider a list of all the medicines, herbs, non-prescription drugs, or dietary supplements you use. Also tell them if you smoke, drink alcohol, or use illegal drugs. Some items may interact with your medicine. What should I watch for while using this medicine? Your condition will be monitored carefully while you are receiving this medicine. Visit your doctor for regular check ups. Tell your doctor or healthcare professional if your symptoms do not start to get better or if they get worse. Stay away from people who are sick. Call your doctor or health care professional for advice if you get a fever, chills or sore throat, or other symptoms of a cold or flu. Do not treat yourself. In some patients, this medicine may cause a serious brain infection that may cause death. If you have any problems seeing, thinking, speaking, walking, or standing, tell your doctor right away. If you cannot reach your doctor, get urgent medical care. What side effects may I notice from receiving this medicine? Side effects that you should report to your doctor or health care professional as soon as possible:  allergic reactions like skin rash, itching or hives, swelling of the face, lips, or tongue  breathing problems  changes in vision  chest pain  dark urine  depression, feelings of sadness  dizziness  general ill feeling or flu-like symptoms  irregular, missed, or painful menstrual periods  light-colored stools  loss of appetite, nausea  muscle weakness  problems with balance, talking, or walking  right upper belly pain  unusually weak or tired  yellowing  of the eyes or skin Side effects that usually do not require medical attention (report to your doctor or health care professional if they continue or are bothersome):  aches, pains  headache  stomach upset  tiredness This list may not describe all possible side effects. Call your doctor for  medical advice about side effects. You may report side effects to FDA at 1-800-FDA-1088. Where should I keep my medicine? This drug is given in a hospital or clinic and will not be stored at home. NOTE: This sheet is a summary. It may not cover all possible information. If you have questions about this medicine, talk to your doctor, pharmacist, or health care provider.  2020 Elsevier/Gold Standard (2015-10-31 08:36:51)

## 2019-08-11 ENCOUNTER — Telehealth: Payer: Self-pay | Admitting: Gastroenterology

## 2019-08-11 NOTE — Telephone Encounter (Signed)
April from [employer] left vm regarding pt application for assistance for next year they only received the social secury benefit from pt not spouse they are requesting 2020 awards letters she left pt a vm as well if any questions please call   cb [phone]

## 2019-08-14 ENCOUNTER — Telehealth: Payer: Self-pay

## 2019-08-14 NOTE — Telephone Encounter (Signed)
-----   Message from [name], MD sent at 08/10/2019 10:24 AM EDT ----- Regarding: FW: Appointment Yameli Delamater   Can you please clarify: When did he get his last dose of Entyvio and where was it given at home or in the cancer center.  Also informed the patient that I suggest that he has it exactly at the 8-week mark from the last infusion, delaying the infusion may lead to the medication not working the way it should and puts him at higher risk for developing acute colitis.  Please document that in the chart when you speak to him  Kiran  ----- Message ----- From: Lequita Asal, MD Sent: 08/10/2019  10:22 AM EDT To: Domingo Pulse, MD Subject: RE: Appointment                                 We have to make sure that this is Dr Georgeann Oppenheim plan.  I have cc'd this message to Dr Vicente Males.  M ----- Message ----- From: Secundino Ginger Sent: 08/10/2019   9:18 AM EDT To: Lequita Asal, MD Subject: RE: Appointment                                I talked to him yesterday, he called here and said he is coming 12/4. That's 10 weeks from last tx. He said he don't want to come during Thanksgiving then he is supposed to see you and have Entyvio in Jan. ----- Message ----- From: Lequita Asal, MD Sent: 08/09/2019   5:18 PM EDT To: Secundino Ginger Subject: RE: Appointment                                 When he was last here, I thought he said he was not coming back.  He was also getting Entyvio via Dr Vicente Males.  Let me know.  M ----- Message ----- From: Secundino Ginger Sent: 08/09/2019   8:53 AM EDT To: Lequita Asal, MD Subject: Appointment                                    //You saw this patient in July and he refuses his labs that day and your wrap up said he had to see  Dr Vicente Males before infusion. His last infusion was 9/25. You didn't say when to see you again. I assuming it is everysix months and will he need labs also?

## 2019-08-14 NOTE — Telephone Encounter (Signed)
Spoke with pt regarding Entyvio infusion appointment. Pt confirmed that his last infusion was on 07-07-19. I informed pt of Dr. Georgeann Oppenheim concerns and recommendation.  I explained to pt that his next infusion would actually fall on Friday the week before Thanksgiving, pt then stated that he normally feels bad for several days after his infusions so he doesn't want to take that chance he'd rather keep it on the week after Thanksgiving. Pt states he did fine when his last infusion was delayed a whole week. Pt states if he's not able to avoid the infusion the week of Thanksgiving he'd rather stop the infusions completely.

## 2019-08-14 NOTE — Telephone Encounter (Signed)
Spoke with pt and informed him that [facility] connect is requesting additional financial information and explained that he'll need to contact them regarding the additional information. Pt agrees to contact.

## 2019-08-14 NOTE — Telephone Encounter (Signed)
Inform patient , our duty is to advise what is best for him and he should make his decision on what he wishes .  Please schedule infusion when patient is willing to have it

## 2019-08-21 ENCOUNTER — Telehealth: Payer: Self-pay | Admitting: Gastroenterology

## 2019-08-21 NOTE — Telephone Encounter (Signed)
April from [employer] left vm regarding pt was accepted for re- enrolledment for pt assistant program   cb [phone]

## 2019-08-31 ENCOUNTER — Telehealth: Payer: Self-pay | Admitting: Gastroenterology

## 2019-08-31 NOTE — Telephone Encounter (Signed)
Matt with [facility] called and ask if you will fax a prescription to him @ [phone] for Entyvio & to the patient assistance program.

## 2019-09-01 ENCOUNTER — Other Ambulatory Visit: Payer: Self-pay

## 2019-09-01 MED ORDER — VEDOLIZUMAB 300 MG IV SOLR: INTRAVENOUS | 6 refills | Status: DC

## 2019-09-01 NOTE — Telephone Encounter (Signed)
Pt Entyvio prescription has been faxed to both [facility] and [facility] pt assistance program.

## 2019-09-14 ENCOUNTER — Other Ambulatory Visit: Payer: Self-pay

## 2019-09-15 ENCOUNTER — Inpatient Hospital Stay: Payer: Medicare Other | Attending: Hematology and Oncology

## 2019-09-15 ENCOUNTER — Ambulatory Visit: Payer: Medicare Other | Admitting: Hematology and Oncology

## 2019-09-15 VITALS — BP 133/76 | HR 71 | Temp 96.0°F | Resp 16

## 2019-09-15 DIAGNOSIS — K5180 Other ulcerative colitis without complications: Secondary | ICD-10-CM | POA: Diagnosis present

## 2019-09-15 DIAGNOSIS — I2510 Atherosclerotic heart disease of native coronary artery without angina pectoris: Secondary | ICD-10-CM | POA: Insufficient documentation

## 2019-09-15 DIAGNOSIS — Z79899 Other long term (current) drug therapy: Secondary | ICD-10-CM | POA: Diagnosis not present

## 2019-09-15 DIAGNOSIS — Z8249 Family history of ischemic heart disease and other diseases of the circulatory system: Secondary | ICD-10-CM | POA: Insufficient documentation

## 2019-09-15 DIAGNOSIS — K51919 Ulcerative colitis, unspecified with unspecified complications: Secondary | ICD-10-CM

## 2019-09-15 MED ORDER — VEDOLIZUMAB 300 MG IV SOLR
300.0000 mg | Freq: Once | INTRAVENOUS | Status: AC
  Administered 2019-09-15: 300 mg via INTRAVENOUS
  Filled 2019-09-15: qty 5

## 2019-09-15 MED ORDER — SODIUM CHLORIDE 0.9 % IV SOLN
Freq: Once | INTRAVENOUS | Status: AC
  Administered 2019-09-15: 10:00:00 via INTRAVENOUS
  Filled 2019-09-15: qty 250

## 2019-09-15 NOTE — Patient Instructions (Signed)
Vedolizumab injection solution What is this medicine? VEDOLIZUMAB (Ve doe LIZ you mab) is used to treat ulcerative colitis and Crohn's disease in adult patients. This medicine may be used for other purposes; ask your health care provider or pharmacist if you have questions. COMMON BRAND NAME(S): Entyvio What should I tell my health care provider before I take this medicine? They need to know if you have any of these conditions:  diabetes  hepatitis B or history of hepatitis B infection  HIV or AIDS  immune system problems  infection or history of infections  liver disease  recently received or scheduled to receive a vaccine  scheduled to have surgery  tuberculosis, a positive skin test for tuberculosis or have recently been in close contact with someone who has tuberculosis   an unusual or allergic reaction to vedolizumab, other medicines, foods, dyes, or preservatives  pregnant or trying to get pregnant  breast-feeding How should I use this medicine? This medicine is for infusion into a vein. It is given by a health care professional in a hospital or clinic setting. A special MedGuide will be given to you by the pharmacist with each prescription and refill. Be sure to read this information carefully each time. Talk to your pediatrician regarding the use of this medicine in children. This medicine is not approved for use in children. Overdosage: If you think you have taken too much of this medicine contact a poison control center or emergency room at once. NOTE: This medicine is only for you. Do not share this medicine with others. What if I miss a dose? It is important not to miss your dose. Call your doctor or health care professional if you are unable to keep an appointment. What may interact with this medicine?  steroid medicines like prednisone or cortisone  TNF-alpha inhibitors like natalizumab, adalimumab, and infliximab  vaccines This list may not describe all  possible interactions. Give your health care provider a list of all the medicines, herbs, non-prescription drugs, or dietary supplements you use. Also tell them if you smoke, drink alcohol, or use illegal drugs. Some items may interact with your medicine. What should I watch for while using this medicine? Your condition will be monitored carefully while you are receiving this medicine. Visit your doctor for regular check ups. Tell your doctor or healthcare professional if your symptoms do not start to get better or if they get worse. Stay away from people who are sick. Call your doctor or health care professional for advice if you get a fever, chills or sore throat, or other symptoms of a cold or flu. Do not treat yourself. In some patients, this medicine may cause a serious brain infection that may cause death. If you have any problems seeing, thinking, speaking, walking, or standing, tell your doctor right away. If you cannot reach your doctor, get urgent medical care. What side effects may I notice from receiving this medicine? Side effects that you should report to your doctor or health care professional as soon as possible:  allergic reactions like skin rash, itching or hives, swelling of the face, lips, or tongue  breathing problems  changes in vision  chest pain  dark urine  depression, feelings of sadness  dizziness  general ill feeling or flu-like symptoms  irregular, missed, or painful menstrual periods  light-colored stools  loss of appetite, nausea  muscle weakness  problems with balance, talking, or walking  right upper belly pain  unusually weak or tired  yellowing  of the eyes or skin Side effects that usually do not require medical attention (report to your doctor or health care professional if they continue or are bothersome):  aches, pains  headache  stomach upset  tiredness This list may not describe all possible side effects. Call your doctor for  medical advice about side effects. You may report side effects to FDA at 1-800-FDA-1088. Where should I keep my medicine? This drug is given in a hospital or clinic and will not be stored at home. NOTE: This sheet is a summary. It may not cover all possible information. If you have questions about this medicine, talk to your doctor, pharmacist, or health care provider.  2020 Elsevier/Gold Standard (2015-10-31 08:36:51)

## 2019-10-31 ENCOUNTER — Other Ambulatory Visit: Payer: Self-pay

## 2019-10-31 DIAGNOSIS — K5100 Ulcerative (chronic) pancolitis without complications: Secondary | ICD-10-CM

## 2019-11-01 NOTE — Telephone Encounter (Signed)
Spoke with pt and informed him that I contacted [facility] regarding pt's lab orders and found out his printed lab requisition was sent along with another pt's specimen. Apparently our office lab tech unknowingly included David [name] printed requisition along with another pt's requisition who was being seen in the office at the time. However, the lab specimen was entered under David [name] instead of the rightful patient, a TSH lab test was also added to David [name]'s lab order although this test was not ordered by David [name]. The [employer] customer service rep states she will send a request to their Name Discrepancy dept, who will then fax our office forms to make the correction. The results will be removed from David [name] account and pt will not be billed.

## 2019-11-02 NOTE — Telephone Encounter (Signed)
Noted  

## 2019-11-04 NOTE — Progress Notes (Signed)
[facility]  [address] [place], [place] [address] Phone: [phone]  Fax: [phone]   Clinic Day:  11/10/2019  Referring physician: Kirk Ruths, MD  Chief Complaint: David [name] is a 74 y.o. 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    HPI: The patient was last seen in the medical oncology clinic on 04/25/2019.  At that time, he denied any complaints. Hematocrit 38.7, hemoglobin 13.4, MCV 90.2, platelets 255,000, WBC 7500, ANC 4600. CRP was <0.8, vitamin D was 73.4.  He received 300 mg Entyvio on 05/05/2019, 07/07/2019 and 09/15/2019.  He was seen by Dr. Vicente Males on 05/09/2019.  He declined a colonoscopy and refused DEXA scan.  Attempt was made to set up home infusion of Entyvio.  He was to follow up in 6 months.    Labs from the [facility] on 10/26/2019 revealed an AST 26, ALT 26, alkaline phosphatase 77, and bilirubin 0.9.  CBC results in Epic on 10/31/2019 are for another patient.  During the interim, he was been fine and unchanged.  He denies any melena or hematochezia.  He denies any nausea, vomiting or diarrhea.  Bowel movements are normal every 1-2 days.   Past Medical History:  Diagnosis Date  . Anemia   . CAD (coronary artery disease) 07/08/2011   Overview:  a. 07/2006: Anterior ST elevation MI. b. 07/2006: PCI with BMS to LAD and RCA. c. 09/2006: Non ST elevation. d. LVEF 30%.  Discussed ICD, not currently interested.   . 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 ([place]) 07/05/2682  . Dyslipidemia 07/30/2011   Overview:  High triglycerides  . GERD (gastroesophageal reflux disease) 07/08/2011  . History of Clostridium difficile colitis   . History of myocardial infarction   . Hyperlipidemia, unspecified 07/08/2011  . Hypothyroidism   . 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 Type II diabetes mellitus, uncontrolled  ([place]) 07/08/2011  . Vitamin D deficiency     Past Surgical History:  Procedure Laterality Date  . CARDIAC CATHETERIZATION    . CHOLECYSTECTOMY    . COLONOSCOPY  07/20/2006   Crohn's disease  . CORONARY ANGIOGRAPHY N/A 07/07/2017   Procedure: CORONARY ANGIOGRAPHY;  Surgeon: Teodoro Spray, MD;  Location: [facility];  Service: Cardiovascular;  Laterality: N/A;  . CORONARY STENT PLACEMENT    . FLEXIBLE SIGMOIDOSCOPY N/A 09/25/2016   Procedure: FLEXIBLE SIGMOIDOSCOPY;  Surgeon: [name], MD;  Location: [facility];  Service: Endoscopy;  Laterality: N/A;  . LEFT HEART CATH Left 07/07/2017   Procedure: Left Heart Cath;  Surgeon: Teodoro Spray, MD;  Location: [facility];  Service: Cardiovascular;  Laterality: Left;  Marland Kitchen MELANOMA REMOVED    . parotid gland removal      Family History  Problem Relation Age of Onset  . Heart disease Mother   . Heart attack Father   . Heart disease Sister   . Heart disease Brother     Social History:  reports that he has never smoked. He has never used smokeless tobacco. He reports that he does not drink alcohol or use drugs.  He lives in [place]. The patient is alone today.  Allergies:  Allergies  Allergen Reactions  . Mesalamine Nausea Only and Nausea And Vomiting    Current Medications: Current Outpatient Medications  Medication Sig Dispense Refill  . ACCU-CHEK AVIVA PLUS test strip 1 each by Other route as needed  for other.     Marland Kitchen ACCU-CHEK SOFTCLIX LANCETS lancets USE 2 TIMES DAILY. USE AS INSTRUCTED.    Marland Kitchen aspirin EC 81 MG tablet Take 81 mg by mouth daily.     Marland Kitchen atorvastatin (LIPITOR) 40 MG tablet Take 40 mg by mouth 2 (two) times a day.     . carvedilol (COREG) 3.125 MG tablet Take 3.125 mg by mouth 2 (two) times daily with a meal.     . Cholecalciferol (D 5000) 125 MCG (5000 UT) capsule Take 5,000 Units by mouth daily.     . clopidogrel (PLAVIX) 75 MG tablet TAKE 1 TABLET BY MOUTH ONCE DAILY    . gabapentin (NEURONTIN)  300 MG capsule Take 300 mg by mouth 3 (three) times daily.     Marland Kitchen glipiZIDE (GLUCOTROL) 10 MG tablet Take 10 mg by mouth 2 (two) times daily.     Marland Kitchen levothyroxine (SYNTHROID) 75 MCG tablet Take 75 mcg by mouth daily before breakfast.    . Multiple Vitamins-Minerals (CENTRUM SILVER 50+MEN PO) Take by mouth.    . Omega-3 Fatty Acids (FISH OIL PO) Take 1,200 mg by mouth 2 (two) times daily.     Marland Kitchen omeprazole (PRILOSEC) 20 MG capsule TAKE 1 CAPSULE BY MOUTH  ONCE DAILY.  MAKE SURE TO  TAKE 12 HOURS APART FROM  PLAVIX.    Marland Kitchen Probiotic Product (PROBIOTIC DAILY PO) Take 1 capsule by mouth daily.     Marland Kitchen pyridOXINE (VITAMIN B-6) 100 MG tablet Take 100 mg by mouth daily.    . vedolizumab (ENTYVIO) 300 MG injection Inject into the vein. Every 8 weeks 1 each 6  . vitamin B-12 (CYANOCOBALAMIN) 1000 MCG tablet Take 1,000 mcg by mouth daily.     No current facility-administered medications for this visit.    Review of Systems  Constitutional: Negative for chills, fever, malaise/fatigue and weight loss (up 5 lbs).       Feels "ok".  HENT: Negative.  Negative for congestion, ear pain, hearing loss, sore throat and tinnitus.   Eyes: Negative.  Negative for blurred vision and double vision.  Respiratory: Negative.  Negative for cough, hemoptysis and sputum production.   Cardiovascular: Negative.  Negative for chest pain, palpitations and leg swelling.  Gastrointestinal: Negative.  Negative for abdominal pain, blood in stool, constipation, diarrhea, heartburn, nausea and vomiting.  Genitourinary: Negative.  Negative for dysuria, frequency, hematuria and urgency.  Musculoskeletal: Negative.  Negative for back pain, falls, myalgias and neck pain.  Skin: Negative.  Negative for rash.  Neurological: Negative.  Negative for dizziness, tingling, sensory change, speech change, weakness and headaches.  Endo/Heme/Allergies: Negative.  Does not bruise/bleed easily.  Psychiatric/Behavioral: Negative.  Negative for depression  and memory loss. The patient is not nervous/anxious and does not have insomnia.   All other systems reviewed and are negative.  Performance status (ECOG): 0 - Asymptomatic  Vitals Blood pressure (!) 160/83, pulse 74, temperature (!) 95 F (35 C), temperature source Tympanic, resp. rate 18, weight 192 lb 12.7 oz (87.5 kg), SpO2 100 %.   Physical Exam  Constitutional: He is oriented to person, place, and time. He appears well-developed and well-nourished. No distress.  HENT:  Head: Normocephalic and atraumatic.  Short gray hair.  Male pattern baldness.  Mask.  Eyes: Pupils are equal, round, and reactive to light. Conjunctivae and EOM are normal. No scleral icterus.  Glasses. Blue eyes.  Neck: No JVD present.  Cardiovascular: Normal rate, regular rhythm and normal heart sounds.  Pulmonary/Chest: Effort normal  and breath sounds normal. No respiratory distress. He has no wheezes. He has no rales.  Abdominal: Soft. Bowel sounds are normal. He exhibits no distension and no mass. There is no abdominal tenderness. There is no rebound and no guarding.  Musculoskeletal:        General: No edema.     Cervical back: Normal range of motion and neck supple.  Lymphadenopathy:       Head (right side): No preauricular, no posterior auricular and no occipital adenopathy present.       Head (left side): No preauricular, no posterior auricular and no occipital adenopathy present.    He has no cervical adenopathy.    He has no axillary adenopathy.       Right: No inguinal and no supraclavicular adenopathy present.       Left: No inguinal and no supraclavicular adenopathy present.  Neurological: He is alert and oriented to person, place, and time.  Skin: Skin is warm and dry. No rash noted. He is not diaphoretic. No erythema. No pallor.  Psychiatric: Judgment and thought content normal.  Nursing note and vitals reviewed.   No visits with results within 3 Day(s) from this visit.  Latest known visit with  results is:  Orders Only on 10/31/2019  Component Date Value Ref Range Status  . WBC 10/31/2019 9.8  3.4 - 10.8 x10E3/uL Final  . RBC 10/31/2019 4.25  4.14 - 5.80 x10E6/uL Final  . Hemoglobin 10/31/2019 14.1  13.0 - 17.7 g/dL Final  . Hematocrit 10/31/2019 40.0  37.5 - 51.0 % Final  . MCV 10/31/2019 94  79 - 97 fL Final  . MCH 10/31/2019 33.2* 26.6 - 33.0 pg Final  . MCHC 10/31/2019 35.3  31.5 - 35.7 g/dL Final  . RDW 10/31/2019 12.0  11.6 - 15.4 % Final  . Platelets 10/31/2019 417  150 - 450 x10E3/uL Final  . Glucose 10/31/2019 92  65 - 99 mg/dL Final  . BUN 10/31/2019 14  8 - 27 mg/dL Final  . Creatinine, Ser 10/31/2019 1.48* 0.76 - 1.27 mg/dL Final  . GFR calc non Af Amer 10/31/2019 46* >59 mL/min/1.73 Final  . GFR calc Af Amer 10/31/2019 53* >59 mL/min/1.73 Final  . BUN/Creatinine Ratio 10/31/2019 9* 10 - 24 Final  . Sodium 10/31/2019 140  134 - 144 mmol/L Final  . Potassium 10/31/2019 4.5  3.5 - 5.2 mmol/L Final  . Chloride 10/31/2019 102  96 - 106 mmol/L Final  . CO2 10/31/2019 19* 20 - 29 mmol/L Final  . Calcium 10/31/2019 10.1  8.6 - 10.2 mg/dL Final  . Total Protein 10/31/2019 7.4  6.0 - 8.5 g/dL Final  . Albumin 10/31/2019 4.6  3.7 - 4.7 g/dL Final  . Globulin, Total 10/31/2019 2.8  1.5 - 4.5 g/dL Final  . Albumin/Globulin Ratio 10/31/2019 1.6  1.2 - 2.2 Final  . Bilirubin Total 10/31/2019 0.4  0.0 - 1.2 mg/dL Final  . Alkaline Phosphatase 10/31/2019 101  39 - 117 IU/L Final  . AST 10/31/2019 41* 0 - 40 IU/L Final  . ALT 10/31/2019 80* 0 - 44 IU/L Final  . CRP 10/31/2019 3  0 - 10 mg/L Final  . Vitamin D 1, 25 (OH)2 Total 10/31/2019 WILL FOLLOW   Preliminary  . Vitamin D2 1, 25 (OH)2 10/31/2019 WILL FOLLOW   Preliminary  . Vitamin D3 1, 25 (OH)2 10/31/2019 WILL FOLLOW   Preliminary  . TSH 10/31/2019 0.883  0.450 - 4.500 uIU/mL Final    Assessment:  David [name] is a 74 y.o. 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  He has been treated  with cyclosporine (09/2006), Remicade (09/2006 - 2009; 01/05/2017), steroids, and Entyvio.    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  He did not tolerate mesalamine.  He developed joint and muscle pain on Remicade.  The patient has received Entyvio on 06/08/2017, 06/25/2017, 07/23/2017, 09/29/2017, 12/07/2017, 02/01/2018, 03/29/2018, 05/24/2018, 07/19/2018, 09/13/2018, 11/08/2018, 01/03/2019, 02/28/2019, 05/05/2019, 07/07/2019 and 09/15/2019.  Sigmoidoscopy in 09/25/2016 revealed moderate to severe left-sided colitis.    Abdomen and pelvic CT on 09/28/2016 revealed diffuse colonic wall thickening from the proximal transverse colon to the rectum.  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   The patient has coronary artery disease s/p LAD stent, mid RCA occlusion and left ventricular thrombus. Cardiac MRI on 08/25/2017 revealed moderately dilated left ventricle with normal wall thickness and severely decreased systolic function (LVEF = 39%). There was akinesis of the mid inferior, inferolateral, anterior, anteroseptal and apical inferior, anterior and septal walls.   Symptomatically, he denies any abdominal symptoms.  Exam is unremarkable.  Plan: 1.   Review labs from 10/26/2019. 2.   Moderate to severe ulcerative colitis             Clinically, he is doing well.  Entyvio today.  Continue Entyvio every 8 weeks. 3.   Liver function tests  AST 32 and ALT 26.  Bilirubin 0.7.  Alaline phosphatase 81 on 09/22/2018.  AST 28 and ALT 28.  Bilirubin 1.4.  Alaline phosphatase 75 on 04/27/2020.  AST 26 and ALT 26.  Bilirubin 0.9.  Alaline phosphatase 77 on 10/26/2019.  Elevations of transaminase and/or bilirubin have been reported with Entyvio.   Plan to discontinue Entyvio with jaundice or other evidence  of significant liver injury (fatigue, anorexia, right upper abdominal discomfort, or dark urine).  Spoke with Dr Vicente Males twice today regarding his labs and treatment.  Plan to follow LFTs prior to each cycle of treatment. 4.   RTC in 2 months for Entyvio. 5.   RTC in 4 months for MD assess and Entyvio. 6.   Nursing to confirm that labs drawn by GI before each infusion.  I discussed the assessment and treatment plan with the patient.  The patient was provided an opportunity to ask questions and all were answered.  The patient agreed with the plan and demonstrated an understanding of the instructions.  The patient was advised to call back if the symptoms worsen or if the condition fails to improve as anticipated.  I provided 13 minutes (10:33 AM - 10:46 AM) of face-to-face time during this this encounter and > 50% was spent counseling as documented under my assessment and plan.  A total of 30 minutes were spent today reviewing labs (and issues with Epic labs) and 2 conversations with Dr Vicente Males.   Lequita Asal, MD, PhD    11/10/2019, 10:46 AM  I, Samul Dada, am acting as a scribe for Lequita Asal, MD.  I, Triplett Mike Gip, MD, have reviewed the above documentation for accuracy and completeness, and I agree with the above.

## 2019-11-09 NOTE — Progress Notes (Signed)
Pt called DOB and name verified, reminded of appt for tomorrow at 54 with MD. Pt verbalized understanding.  Pt denies any concerns states saw PCP and had physical 2 weeks ago.

## 2019-11-10 ENCOUNTER — Other Ambulatory Visit: Payer: Self-pay

## 2019-11-10 ENCOUNTER — Encounter: Payer: Self-pay | Admitting: Hematology and Oncology

## 2019-11-10 ENCOUNTER — Inpatient Hospital Stay: Payer: Medicare Other | Attending: Hematology and Oncology | Admitting: Hematology and Oncology

## 2019-11-10 ENCOUNTER — Inpatient Hospital Stay: Payer: Medicare Other

## 2019-11-10 VITALS — BP 138/77 | HR 65 | Temp 98.0°F | Resp 16

## 2019-11-10 VITALS — BP 160/83 | HR 74 | Temp 95.0°F | Resp 18 | Wt 192.8 lb

## 2019-11-10 DIAGNOSIS — Z85820 Personal history of malignant melanoma of skin: Secondary | ICD-10-CM | POA: Diagnosis not present

## 2019-11-10 DIAGNOSIS — Z8619 Personal history of other infectious and parasitic diseases: Secondary | ICD-10-CM | POA: Insufficient documentation

## 2019-11-10 DIAGNOSIS — M791 Myalgia, unspecified site: Secondary | ICD-10-CM | POA: Insufficient documentation

## 2019-11-10 DIAGNOSIS — K5190 Ulcerative colitis, unspecified, without complications: Secondary | ICD-10-CM | POA: Diagnosis not present

## 2019-11-10 DIAGNOSIS — Z8249 Family history of ischemic heart disease and other diseases of the circulatory system: Secondary | ICD-10-CM | POA: Insufficient documentation

## 2019-11-10 DIAGNOSIS — K51919 Ulcerative colitis, unspecified with unspecified complications: Secondary | ICD-10-CM

## 2019-11-10 DIAGNOSIS — Z79899 Other long term (current) drug therapy: Secondary | ICD-10-CM | POA: Diagnosis not present

## 2019-11-10 DIAGNOSIS — I252 Old myocardial infarction: Secondary | ICD-10-CM | POA: Insufficient documentation

## 2019-11-10 MED ORDER — SODIUM CHLORIDE 0.9 % IV SOLN
Freq: Once | INTRAVENOUS | Status: AC
  Filled 2019-11-10: qty 250

## 2019-11-10 MED ORDER — VEDOLIZUMAB 300 MG IV SOLR
300.0000 mg | Freq: Once | INTRAVENOUS | Status: AC
  Administered 2019-11-10: 300 mg via INTRAVENOUS
  Filled 2019-11-10: qty 5

## 2019-11-10 NOTE — Patient Instructions (Signed)
Vedolizumab injection solution What is this medicine? VEDOLIZUMAB (Ve doe LIZ you mab) is used to treat ulcerative colitis and Crohn's disease in adult patients. This medicine may be used for other purposes; ask your health care provider or pharmacist if you have questions. COMMON BRAND NAME(S): Entyvio What should I tell my health care provider before I take this medicine? They need to know if you have any of these conditions:  diabetes  hepatitis B or history of hepatitis B infection  HIV or AIDS  immune system problems  infection or history of infections  liver disease  recently received or scheduled to receive a vaccine  scheduled to have surgery  tuberculosis, a positive skin test for tuberculosis or have recently been in close contact with someone who has tuberculosis   an unusual or allergic reaction to vedolizumab, other medicines, foods, dyes, or preservatives  pregnant or trying to get pregnant  breast-feeding How should I use this medicine? This medicine is for infusion into a vein. It is given by a health care professional in a hospital or clinic setting. A special MedGuide will be given to you by the pharmacist with each prescription and refill. Be sure to read this information carefully each time. Talk to your pediatrician regarding the use of this medicine in children. This medicine is not approved for use in children. Overdosage: If you think you have taken too much of this medicine contact a poison control center or emergency room at once. NOTE: This medicine is only for you. Do not share this medicine with others. What if I miss a dose? It is important not to miss your dose. Call your doctor or health care professional if you are unable to keep an appointment. What may interact with this medicine?  steroid medicines like prednisone or cortisone  TNF-alpha inhibitors like natalizumab, adalimumab, and infliximab  vaccines This list may not describe all  possible interactions. Give your health care provider a list of all the medicines, herbs, non-prescription drugs, or dietary supplements you use. Also tell them if you smoke, drink alcohol, or use illegal drugs. Some items may interact with your medicine. What should I watch for while using this medicine? Your condition will be monitored carefully while you are receiving this medicine. Visit your doctor for regular check ups. Tell your doctor or healthcare professional if your symptoms do not start to get better or if they get worse. Stay away from people who are sick. Call your doctor or health care professional for advice if you get a fever, chills or sore throat, or other symptoms of a cold or flu. Do not treat yourself. In some patients, this medicine may cause a serious brain infection that may cause death. If you have any problems seeing, thinking, speaking, walking, or standing, tell your doctor right away. If you cannot reach your doctor, get urgent medical care. What side effects may I notice from receiving this medicine? Side effects that you should report to your doctor or health care professional as soon as possible:  allergic reactions like skin rash, itching or hives, swelling of the face, lips, or tongue  breathing problems  changes in vision  chest pain  dark urine  depression, feelings of sadness  dizziness  general ill feeling or flu-like symptoms  irregular, missed, or painful menstrual periods  light-colored stools  loss of appetite, nausea  muscle weakness  problems with balance, talking, or walking  right upper belly pain  unusually weak or tired  yellowing  of the eyes or skin Side effects that usually do not require medical attention (report to your doctor or health care professional if they continue or are bothersome):  aches, pains  headache  stomach upset  tiredness This list may not describe all possible side effects. Call your doctor for  medical advice about side effects. You may report side effects to FDA at 1-800-FDA-1088. Where should I keep my medicine? This drug is given in a hospital or clinic and will not be stored at home. NOTE: This sheet is a summary. It may not cover all possible information. If you have questions about this medicine, talk to your doctor, pharmacist, or health care provider.  2020 Elsevier/Gold Standard (2015-10-31 08:36:51)

## 2019-11-13 ENCOUNTER — Encounter: Payer: Self-pay | Admitting: Hematology and Oncology

## 2019-11-14 NOTE — Progress Notes (Signed)
Order(s) created erroneously. Erroneous order ID: [id number]  Order moved by: Pete Pelt  Order move date/time: 11/14/2019 2:58 PM  Source Patient: [id number]  Source Contact: 10/31/2019  Destination Patient: [id number]  Destination Contact: 05/01/2019  Erroneous order ID: [id number]  Order moved by: Pete Pelt  Order move date/time: 11/14/2019 2:58 PM  Source Patient: [id number]  Source Contact: 10/31/2019  Destination Patient: [id number]  Destination Contact: 05/01/2019  Erroneous order ID: [id number]  Order moved by: Pete Pelt  Order move date/time: 11/14/2019 2:58 PM  Source Patient: [id number]  Source Contact: 10/31/2019  Destination Patient: [id number]  Destination Contact: 05/01/2019  Erroneous order ID: [id number]  Order moved by: Pete Pelt  Order move date/time: 11/14/2019 2:58 PM  Source Patient: [id number]  Source Contact: 10/31/2019  Destination Patient: [id number]  Destination Contact: 05/01/2019  Erroneous order ID: [id number]  Order moved by: Pete Pelt  Order move date/time: 11/14/2019 2:58 PM  Source Patient: [id number]  Source Contact: 10/31/2019  Destination Patient: [id number]  Destination Contact: 05/01/2019

## 2019-11-15 ENCOUNTER — Other Ambulatory Visit: Payer: Self-pay

## 2019-11-15 ENCOUNTER — Telehealth: Payer: Self-pay

## 2019-11-15 DIAGNOSIS — K5100 Ulcerative (chronic) pancolitis without complications: Secondary | ICD-10-CM

## 2019-11-15 NOTE — Telephone Encounter (Signed)
Spoke with pt and informed him that the erroneous lab results have been removed from his chart and we can now proceed with reordering the labs. Pt agrees and plans to visit the lab early next week.

## 2019-11-16 ENCOUNTER — Other Ambulatory Visit
Admission: RE | Admit: 2019-11-16 | Discharge: 2019-11-16 | Disposition: A | Discharge status: Home / Self Care | Payer: Medicare Other | Attending: Gastroenterology | Admitting: Gastroenterology

## 2019-11-16 ENCOUNTER — Encounter: Payer: Self-pay | Admitting: Gastroenterology

## 2019-11-16 DIAGNOSIS — K5100 Ulcerative (chronic) pancolitis without complications: Secondary | ICD-10-CM | POA: Insufficient documentation

## 2019-11-16 LAB — CBC
HCT: 37.5 % — ABNORMAL LOW (ref 39.0–52.0)
Hemoglobin: 12.8 g/dL — ABNORMAL LOW (ref 13.0–17.0)
MCH: 30.7 pg (ref 26.0–34.0)
MCHC: 34.1 g/dL (ref 30.0–36.0)
MCV: 89.9 fL (ref 80.0–100.0)
Platelets: 229 10*3/uL (ref 150–400)
RBC: 4.17 MIL/uL — ABNORMAL LOW (ref 4.22–5.81)
RDW: 12.9 % (ref 11.5–15.5)
WBC: 6.5 10*3/uL (ref 4.0–10.5)
nRBC: 0 % (ref 0.0–0.2)

## 2019-11-16 LAB — COMPREHENSIVE METABOLIC PANEL
ALT: 31 U/L (ref 0–44)
AST: 31 U/L (ref 15–41)
Albumin: 4.3 g/dL (ref 3.5–5.0)
Alkaline Phosphatase: 78 U/L (ref 38–126)
Anion gap: 11 (ref 5–15)
BUN: 17 mg/dL (ref 8–23)
CO2: 25 mmol/L (ref 22–32)
Calcium: 9.3 mg/dL (ref 8.9–10.3)
Chloride: 103 mmol/L (ref 98–111)
Creatinine, Ser: 1.05 mg/dL (ref 0.61–1.24)
GFR calc Af Amer: >60 mL/min (ref >60)
GFR calc non Af Amer: >60 mL/min (ref >60)
Glucose, Bld: 98 mg/dL (ref 70–99)
Potassium: 3.9 mmol/L (ref 3.5–5.1)
Sodium: 139 mmol/L (ref 135–145)
Total Bilirubin: 1.3 mg/dL — ABNORMAL HIGH (ref 0.3–1.2)
Total Protein: 7.6 g/dL (ref 6.5–8.1)

## 2019-11-16 LAB — C-REACTIVE PROTEIN: CRP: 0.6 mg/dL (ref <1.0)

## 2019-11-16 LAB — VITAMIN D 25 HYDROXY (VIT D DEFICIENCY, FRACTURES): Vit D, 25-Hydroxy: 105.48 ng/mL — ABNORMAL HIGH (ref 30–100)

## 2019-11-20 ENCOUNTER — Telehealth: Payer: Self-pay

## 2019-11-20 NOTE — Telephone Encounter (Signed)
-----   Message from [name], MD sent at 11/16/2019  2:37 PM EST ----- Results within acceptable  range

## 2019-11-20 NOTE — Telephone Encounter (Signed)
Patient verbalized understanding  

## 2019-11-22 ENCOUNTER — Ambulatory Visit: Payer: Medicare Other | Admitting: Gastroenterology

## 2019-11-22 ENCOUNTER — Other Ambulatory Visit: Payer: Self-pay

## 2019-11-22 ENCOUNTER — Encounter: Payer: Self-pay | Admitting: Gastroenterology

## 2019-11-22 VITALS — BP 147/79 | HR 74 | Temp 98.2°F | Ht 72.0 in | Wt 192.0 lb

## 2019-11-22 DIAGNOSIS — K5100 Ulcerative (chronic) pancolitis without complications: Secondary | ICD-10-CM

## 2019-11-22 NOTE — Progress Notes (Signed)
[name] MD, MRCP(U.K) [address]  [place], [place] [address]  Main: [phone]  Fax: [phone]   Primary Care Physician: Kirk Ruths, MD  Primary Gastroenterologist:  Dr. [name]   Follow-up for ulcerative pancolitis  HPI: EULAN HEYWARD is a 74 y.o. male   Summary of history :  Hehas had ulcerative colitis from 2007 . 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 Remicaid d/c in 2009 due to the malignant skin lesions. Since 2009 Not been on any medications. He was being followed by [facility] till 2015 when his doctor retired. Hospitalized in 2011 for flare of the colitis. His symptoms returned in early December 2017when I took over his care .  Sigmoidoscopy demonstrated severe left-sided colitis.  Commenced him on mesalamine which he did not tolerate and was admitted to the hospital with symptoms. 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  Thrombosis of the inferior mesenteric vein.  We wanted to transfer him to a tertiary center but he refused . He was seen at [facility] Dr Daphane Shepherd who specializes in IBD for a second opinion as he has had prior skin cancer with regards to options. She saw him on 12/01/16.Commenced on remicaid 01/05/17.3 days after the first infusion had no blood or diarrhea and firming up of stools.Commenced on Entyvio in 05/2017 due to severe reaction to Remicade   Interval history7/28/2020 -11/22/2019  11/16/2019: Hemoglobin 12.8 g with an MCV of 89.9, CMP normal, CRP normal.  Vitamin D normal. Denies any diarrhea rectal bleeding or abdominal pain.  Receiving his Entyvio  infusions.  Did not have DEXA bone scan( ordered previously).Not immune to Hep A/B , Tb test negativein 09/2018 not keen on vaccinations at this point of time.  However willing to take Covid vaccine.He does not want a colonoscopy, well aware discussed at length that he is very high risk for colon cancer.      Current Outpatient Medications  Medication Sig Dispense Refill  . ACCU-CHEK AVIVA PLUS test strip 1 each by Other route as needed for other.     Marland Kitchen ACCU-CHEK SOFTCLIX LANCETS lancets USE 2 TIMES DAILY. USE AS INSTRUCTED.    Marland Kitchen aspirin EC 81 MG tablet Take 81 mg by mouth daily.     Marland Kitchen atorvastatin (LIPITOR) 40 MG tablet Take 40 mg by mouth 2 (two) times a day.     . carvedilol (COREG) 3.125 MG tablet Take 3.125 mg by mouth 2 (two) times daily with a meal.     . Cholecalciferol (D 5000) 125 MCG (5000 UT) capsule Take 5,000 Units by mouth daily.     . clopidogrel (PLAVIX) 75 MG tablet TAKE 1 TABLET BY MOUTH ONCE DAILY    . gabapentin (NEURONTIN) 300 MG capsule Take 300 mg by mouth 3 (three) times daily.     Marland Kitchen glipiZIDE (GLUCOTROL) 10 MG tablet Take 10 mg by mouth 2 (two) times daily.     Marland Kitchen levothyroxine (SYNTHROID) 75 MCG tablet Take 75 mcg by mouth daily before breakfast.    . Multiple Vitamins-Minerals (CENTRUM SILVER 50+MEN PO) Take by mouth.    . Omega-3 Fatty Acids (FISH OIL  PO) Take 1,200 mg by mouth 2 (two) times daily.     Marland Kitchen omeprazole (PRILOSEC) 20 MG capsule TAKE 1 CAPSULE BY MOUTH  ONCE DAILY.  MAKE SURE TO  TAKE 12 HOURS APART FROM  PLAVIX.    Marland Kitchen Probiotic Product (PROBIOTIC DAILY PO) Take 1 capsule by mouth daily.     Marland Kitchen pyridOXINE (VITAMIN B-6) 100 MG tablet Take 100 mg by mouth daily.    . vedolizumab (ENTYVIO) 300 MG injection Inject into the vein. Every 8 weeks 1 each 6  . vitamin B-12 (CYANOCOBALAMIN) 1000 MCG tablet Take 1,000 mcg by mouth daily.     No current facility-administered medications for this visit.    Allergies as of 11/22/2019 - Review Complete 11/10/2019    Allergen Reaction Noted  . Mesalamine Nausea Only and Nausea And Vomiting 12/01/2016    ROS:  General: Negative for anorexia, weight loss, fever, chills, fatigue, weakness. ENT: Negative for hoarseness, difficulty swallowing , nasal congestion. CV: Negative for chest pain, angina, palpitations, dyspnea on exertion, peripheral edema.  Respiratory: Negative for dyspnea at rest, dyspnea on exertion, cough, sputum, wheezing.  GI: See history of present illness. GU:  Negative for dysuria, hematuria, urinary incontinence, urinary frequency, nocturnal urination.  Endo: Negative for unusual weight change.    Physical Examination:   There were no vitals taken for this visit.  General: Well-nourished, well-developed in no acute distress.  Eyes: No icterus. Conjunctivae pink. Mouth: Oropharyngeal mucosa moist and pink , no lesions erythema or exudate. Lungs: Clear to auscultation bilaterally. Non-labored. Heart: Regular rate and rhythm, no murmurs rubs or gallops.  Abdomen: Bowel sounds are normal, nontender, nondistended, no hepatosplenomegaly or masses, no abdominal bruits or hernia , no rebound or guarding.   Extremities: No lower extremity edema. No clubbing or deformities. Neuro: Alert and oriented x 3.  Grossly intact. Skin: Warm and dry, no jaundice.   Psych: Alert and cooperative, normal mood and affect.   Imaging Studies: No results found.  Assessment and Plan:   SERGEI DELO is a 74 y.o. y/o male here to follow up . 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 He was seen by Dr Daphane Shepherd at [facility] who specializes in IBD for a second opinion and we discussed his case after. Options werecolectomy which he was notkeen, use of Vedolizumab which has  least possibility of skin cancer. Commenced on infliximab on 01/05/17 . Hadsevere reactions after the infusionsand decided tochange [facility]2017 .  Clinically and biochemically in remission.He refuses DEXA bone scan, annual skin exam , immunizations and a colonoscopy . Discussed extensively over prior visits, he is aware of the risk of colon cancer from longstanding ulcerative colitis.  He is however willing to think about a DEXA scan after he speaks to Dr. Ouida Sills.   Plan  1.CBC,CRP,CMP,vitamin D levels , TB quant, Hbsag in 04/30/2020 2.   Attempt to set up home Entyvio infusions, will call up the company and also can order clinic if he can have something at [facility] which he prefers   Dr [name]  MD,MRCP [facility]) Follow up in 6 to 7 months

## 2020-01-02 DIAGNOSIS — B0229 Other postherpetic nervous system involvement: Secondary | ICD-10-CM | POA: Insufficient documentation

## 2020-01-05 ENCOUNTER — Inpatient Hospital Stay: Payer: Medicare Other | Attending: Hematology and Oncology

## 2020-01-05 VITALS — BP 146/75 | HR 73 | Resp 18

## 2020-01-05 DIAGNOSIS — K5190 Ulcerative colitis, unspecified, without complications: Secondary | ICD-10-CM | POA: Diagnosis not present

## 2020-01-05 DIAGNOSIS — I2510 Atherosclerotic heart disease of native coronary artery without angina pectoris: Secondary | ICD-10-CM | POA: Diagnosis not present

## 2020-01-05 DIAGNOSIS — Z8249 Family history of ischemic heart disease and other diseases of the circulatory system: Secondary | ICD-10-CM | POA: Insufficient documentation

## 2020-01-05 DIAGNOSIS — K51919 Ulcerative colitis, unspecified with unspecified complications: Secondary | ICD-10-CM

## 2020-01-05 DIAGNOSIS — Z79899 Other long term (current) drug therapy: Secondary | ICD-10-CM | POA: Insufficient documentation

## 2020-01-05 MED ORDER — SODIUM CHLORIDE 0.9 % IV SOLN
Freq: Once | INTRAVENOUS | Status: AC
  Filled 2020-01-05: qty 250

## 2020-01-05 MED ORDER — VEDOLIZUMAB 300 MG IV SOLR
300.0000 mg | Freq: Once | INTRAVENOUS | Status: AC
  Administered 2020-01-05: 300 mg via INTRAVENOUS
  Filled 2020-01-05: qty 5

## 2020-01-05 NOTE — Patient Instructions (Signed)
Vedolizumab injection solution What is this medicine? VEDOLIZUMAB (Ve doe LIZ you mab) is used to treat ulcerative colitis and Crohn's disease in adult patients. This medicine may be used for other purposes; ask your health care provider or pharmacist if you have questions. COMMON BRAND NAME(S): Entyvio What should I tell my health care provider before I take this medicine? They need to know if you have any of these conditions:  diabetes  hepatitis B or history of hepatitis B infection  HIV or AIDS  immune system problems  infection or history of infections  liver disease  recently received or scheduled to receive a vaccine  scheduled to have surgery  tuberculosis, a positive skin test for tuberculosis or have recently been in close contact with someone who has tuberculosis   an unusual or allergic reaction to vedolizumab, other medicines, foods, dyes, or preservatives  pregnant or trying to get pregnant  breast-feeding How should I use this medicine? This medicine is for infusion into a vein. It is given by a health care professional in a hospital or clinic setting. A special MedGuide will be given to you by the pharmacist with each prescription and refill. Be sure to read this information carefully each time. Talk to your pediatrician regarding the use of this medicine in children. This medicine is not approved for use in children. Overdosage: If you think you have taken too much of this medicine contact a poison control center or emergency room at once. NOTE: This medicine is only for you. Do not share this medicine with others. What if I miss a dose? It is important not to miss your dose. Call your doctor or health care professional if you are unable to keep an appointment. What may interact with this medicine?  steroid medicines like prednisone or cortisone  TNF-alpha inhibitors like natalizumab, adalimumab, and infliximab  vaccines This list may not describe all  possible interactions. Give your health care provider a list of all the medicines, herbs, non-prescription drugs, or dietary supplements you use. Also tell them if you smoke, drink alcohol, or use illegal drugs. Some items may interact with your medicine. What should I watch for while using this medicine? Your condition will be monitored carefully while you are receiving this medicine. Visit your doctor for regular check ups. Tell your doctor or healthcare professional if your symptoms do not start to get better or if they get worse. Stay away from people who are sick. Call your doctor or health care professional for advice if you get a fever, chills or sore throat, or other symptoms of a cold or flu. Do not treat yourself. In some patients, this medicine may cause a serious brain infection that may cause death. If you have any problems seeing, thinking, speaking, walking, or standing, tell your doctor right away. If you cannot reach your doctor, get urgent medical care. What side effects may I notice from receiving this medicine? Side effects that you should report to your doctor or health care professional as soon as possible:  allergic reactions like skin rash, itching or hives, swelling of the face, lips, or tongue  breathing problems  changes in vision  chest pain  dark urine  depression, feelings of sadness  dizziness  general ill feeling or flu-like symptoms  irregular, missed, or painful menstrual periods  light-colored stools  loss of appetite, nausea  muscle weakness  problems with balance, talking, or walking  right upper belly pain  unusually weak or tired  yellowing  of the eyes or skin Side effects that usually do not require medical attention (report to your doctor or health care professional if they continue or are bothersome):  aches, pains  headache  stomach upset  tiredness This list may not describe all possible side effects. Call your doctor for  medical advice about side effects. You may report side effects to FDA at 1-800-FDA-1088. Where should I keep my medicine? This drug is given in a hospital or clinic and will not be stored at home. NOTE: This sheet is a summary. It may not cover all possible information. If you have questions about this medicine, talk to your doctor, pharmacist, or health care provider.  2020 Elsevier/Gold Standard (2015-10-31 08:36:51)

## 2020-01-08 ENCOUNTER — Ambulatory Visit: Payer: Medicare Other

## 2020-02-14 ENCOUNTER — Telehealth: Payer: Self-pay

## 2020-02-14 NOTE — Telephone Encounter (Signed)
Called and left a detail message for patient dentist office with these recommendations.

## 2020-02-14 NOTE — Telephone Encounter (Signed)
Patient dentist office is calling because they removed a tooth today and patient is on Entyvio and they do not know what antibiotic or pain medication they can prescribed since patient is on this medication. Please advised what you would recommend

## 2020-02-14 NOTE — Telephone Encounter (Signed)
Would avoid clindamycin , Others should be ok - which antibiotic would they be planning to give ? If Augmentin should be fine as long as he has no penicillin allergy. Pain meds - tylenol is ok, no to NSAID's, Percocet is fine or tramadol is also fine

## 2020-02-29 NOTE — Progress Notes (Signed)
[facility]  [address] [place], [place] [address] Phone: [phone]  Fax: [phone]   Clinic Day:  03/01/2020  Referring physician: Kirk Ruths, MD  Chief Complaint: David [name] is a 74 y.o. 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    HPI: The patient was last seen in the medical oncology clinic on 11/10/2019. At that time, he denied any abdominal symptoms. Exam was unremarkable.   He received Entyvio.   He was seen by Dr. Vicente Males on 11/22/2019 for follow-up on his ulcerative colitis. He denied any abdominal pain. Attempt was made to set up home infusion of Entyvio. He was to follow up for labs and assessment in 5 months.   Labs on 11/16/2019 revealed a hematocrit of 37.5, hemoglobin 12.8, MCV 89.9, platelets 229,000, WBC 6500. CRP was 0.6. He received Entyvio on 01/05/2020.  Labs from 02/23/2020 ([facility]) revealed a hematocrit of 36.0, hemoglobin 12.5, MCV 91.8, platelets 248,000, WBC 8200.   During the interim, he says he has 'been better'. He reports worsening back pain due to degenerative disc disease. He was seen by his primary care MD, Dr. Ouida Sills yesterday for follow up. Dr. Ouida Sills referred him to [facility] orthopedics, but his appointment isn't until July. He was not prescribed any pain medicine, but was told to take Tylenol, which doesn't help at all. His pain is a 10 with any movement. Patient appears to be extremely depressed today. He says he sees Dr. Vicente Males every 4 months. His bowels are doing good and he denies any abdominal pain.    Past Medical History:  Diagnosis Date  . Anemia   . CAD (coronary artery disease) 07/08/2011   Overview:  a. 07/2006: Anterior ST elevation MI. b. 07/2006: PCI with BMS to LAD and RCA. c. 09/2006: Non ST elevation. d. LVEF 30%.  Discussed ICD, not currently interested.   . Chronic ulcerative enterocolitis without  complication ([place]) 4/49/6759  . Dyslipidemia 07/30/2011   Overview:  High triglycerides  . GERD (gastroesophageal reflux disease) 07/08/2011  . History of Clostridium difficile colitis   . History of myocardial infarction   . Hyperlipidemia, unspecified 07/08/2011  . Hypothyroidism   . 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 Type II diabetes mellitus, uncontrolled ([place]) 07/08/2011  . Vitamin D deficiency     Past Surgical History:  Procedure Laterality Date  . CARDIAC CATHETERIZATION    . CHOLECYSTECTOMY    . COLONOSCOPY  07/20/2006   Crohn's disease  . CORONARY ANGIOGRAPHY N/A 07/07/2017   Procedure: CORONARY ANGIOGRAPHY;  Surgeon: Teodoro Spray, MD;  Location: [facility];  Service: Cardiovascular;  Laterality: N/A;  . CORONARY STENT PLACEMENT    . FLEXIBLE SIGMOIDOSCOPY N/A 09/25/2016   Procedure: FLEXIBLE SIGMOIDOSCOPY;  Surgeon: [name], MD;  Location: [facility] ENDOS[facility];  Service: Endoscopy;  Laterality: N/A;  . LEFT HEART CATH Left 07/07/2017   Procedure: Left Heart Cath;  Surgeon: Teodoro Spray, MD;  Location: [facility];  Service: Cardiovascular;  Laterality: Left;  Marland Kitchen MELANOMA REMOVED    . parotid gland removal      Family History  Problem Relation Age of Onset  . Heart disease Mother   . Heart attack Father   . Heart disease Sister   . Heart disease Brother     Social History:  reports that he has never smoked. He has never used smokeless tobacco.  He reports that he does not drink alcohol or use drugs.  He lives in [place]. The patient is alone today.  Allergies:  Allergies  Allergen Reactions  . Mesalamine Nausea Only and Nausea And Vomiting    Current Medications: Current Outpatient Medications  Medication Sig Dispense Refill  . ACCU-CHEK AVIVA PLUS test strip 1 each by Other route as needed for other.     Marland Kitchen ACCU-CHEK SOFTCLIX LANCETS lancets USE 2 TIMES DAILY. USE AS INSTRUCTED.    Marland Kitchen aspirin EC 81 MG tablet Take 81  mg by mouth daily.     Marland Kitchen atorvastatin (LIPITOR) 40 MG tablet Take 40 mg by mouth 2 (two) times a day.     . carvedilol (COREG) 3.125 MG tablet Take 3.125 mg by mouth 2 (two) times daily with a meal.     . Cholecalciferol (D 5000) 125 MCG (5000 UT) capsule Take 5,000 Units by mouth daily.     . clopidogrel (PLAVIX) 75 MG tablet TAKE 1 TABLET BY MOUTH ONCE DAILY    . gabapentin (NEURONTIN) 300 MG capsule Take 300 mg by mouth 3 (three) times daily.     Marland Kitchen glipiZIDE (GLUCOTROL) 10 MG tablet Take 10 mg by mouth 2 (two) times daily.     Marland Kitchen levothyroxine (SYNTHROID) 75 MCG tablet Take 75 mcg by mouth daily before breakfast.    . Omega-3 Fatty Acids (FISH OIL PO) Take 1,200 mg by mouth 2 (two) times daily.     Marland Kitchen omeprazole (PRILOSEC) 20 MG capsule TAKE 1 CAPSULE BY MOUTH  ONCE DAILY.  MAKE SURE TO  TAKE 12 HOURS APART FROM  PLAVIX.    Marland Kitchen Probiotic Product (PROBIOTIC DAILY PO) Take 1 capsule by mouth daily.     Marland Kitchen pyridOXINE (VITAMIN B-6) 100 MG tablet Take 100 mg by mouth daily.    . vedolizumab (ENTYVIO) 300 MG injection Inject into the vein. Every 8 weeks 1 each 6  . vitamin B-12 (CYANOCOBALAMIN) 1000 MCG tablet Take 1,000 mcg by mouth daily.    . Multiple Vitamins-Minerals (CENTRUM SILVER 50+MEN PO) Take by mouth.     No current facility-administered medications for this visit.    Review of Systems  Constitutional: Positive for weight loss (9 lb). Negative for chills, fever and malaise/fatigue.       Feels "ok".  HENT: Negative.  Negative for congestion, ear pain, hearing loss, sore throat and tinnitus.   Eyes: Negative.  Negative for blurred vision and double vision.  Respiratory: Negative.  Negative for cough, hemoptysis and sputum production.   Cardiovascular: Negative.  Negative for chest pain, palpitations and leg swelling.  Gastrointestinal: Negative.  Negative for abdominal pain, blood in stool, constipation, diarrhea, heartburn, melena, nausea and vomiting.       Bowels are good.    Genitourinary: Negative.  Negative for dysuria, frequency, hematuria and urgency.  Musculoskeletal: Positive for back pain. Negative for falls, joint pain, myalgias and neck pain.  Skin: Negative.  Negative for rash.  Neurological: Negative.  Negative for dizziness, tingling, sensory change, speech change, weakness and headaches.  Endo/Heme/Allergies: Negative.  Does not bruise/bleed easily.  Psychiatric/Behavioral: Negative.  Negative for depression and memory loss. The patient is not nervous/anxious and does not have insomnia.   All other systems reviewed and are negative.  Performance status (ECOG): 0 - Asymptomatic  Vitals Blood pressure 134/73, pulse 82, temperature (!) 95.7 F (35.4 C), temperature source Tympanic, resp. rate 16, weight 183 lb 12.1 oz (83.3 kg), SpO2 99 %.  Physical Exam  Constitutional: He is oriented to person, place, and time. He appears well-developed and well-nourished. No distress.  HENT:  Head: Normocephalic and atraumatic.  Mouth/Throat: Oropharynx is clear and moist.  Short gray hair.  Male pattern baldness.  Mask.  Eyes: Pupils are equal, round, and reactive to light. Conjunctivae and EOM are normal. Right eye exhibits no discharge. Left eye exhibits no discharge. No scleral icterus.  Glasses. Blue eyes.  Neck: No JVD present.  Cardiovascular: Normal rate, regular rhythm, normal heart sounds and intact distal pulses. Exam reveals no gallop and no friction rub.  No murmur heard. Pulmonary/Chest: Breath sounds normal. No respiratory distress. He has no wheezes. He has no rales. He exhibits no tenderness.  Abdominal: Soft. Bowel sounds are normal. He exhibits no distension and no mass. There is no abdominal tenderness. There is no rebound and no guarding.  Musculoskeletal:        General: No tenderness or edema.     Cervical back: Normal range of motion and neck supple.  Lymphadenopathy:       Head (right side): No preauricular, no posterior auricular  and no occipital adenopathy present.       Head (left side): No preauricular, no posterior auricular and no occipital adenopathy present.    He has no cervical adenopathy.    He has no axillary adenopathy.       Right: No supraclavicular adenopathy present.       Left: No supraclavicular adenopathy present.  Neurological: He is alert and oriented to person, place, and time.  Skin: Skin is warm and dry. No rash noted. He is not diaphoretic. No erythema. No pallor.  Psychiatric: He has a normal mood and affect. His behavior is normal. Judgment and thought content normal.  Nursing note and vitals reviewed.   No visits with results within 3 Day(s) from this visit.  Latest known visit with results is:  Hospital Outpatient Visit on 11/16/2019  Component Date Value Ref Range Status  . Vit D, 25-Hydroxy 11/16/2019 105.48* 30 - 100 ng/mL Final   Comment: (NOTE) Vitamin D deficiency has been defined by the [place] practice guideline as a level of serum 25-OH  vitamin D less than 20 ng/mL (1,2). The Endocrine Society went on to  further define vitamin D insufficiency as a level between 21 and 29  ng/mL (2). 1. IOM (Institute of Medicine). 2010. Dietary reference intakes for  calcium and D. [place]: The [employer]. 2. Holick MF, Binkley [place], Bischoff-Ferrari HA, et al. Evaluation,  treatment, and prevention of vitamin D deficiency: an Endocrine  Society clinical practice guideline, JCEM. 2011 Jul; 96(7): 1911-30. Performed at [facility], [place] [address], [place], [place] [address]   . CRP 11/16/2019 0.6  <1.0 mg/dL Final   Performed at [facility] [address] [place], [place] [address]  . Sodium 11/16/2019 139  135 - 145 mmol/L Final  . Potassium 11/16/2019 3.9  3.5 - 5.1 mmol/L Final  . Chloride 11/16/2019 103  98 - 111 mmol/L Final  . CO2 11/16/2019 25  22 - 32 mmol/L Final  . Glucose, Bld 11/16/2019 98  70 - 99  mg/dL Final  . BUN 11/16/2019 17  8 - 23 mg/dL Final  . Creatinine, Ser 11/16/2019 1.05  0.61 - 1.24 mg/dL Final  . Calcium 11/16/2019 9.3  8.9 - 10.3 mg/dL Final  . Total Protein 11/16/2019 7.6  6.5 -  8.1 g/dL Final  . Albumin 11/16/2019 4.3  3.5 - 5.0 g/dL Final  . AST 11/16/2019 31  15 - 41 U/L Final  . ALT 11/16/2019 31  0 - 44 U/L Final  . Alkaline Phosphatase 11/16/2019 78  38 - 126 U/L Final  . Total Bilirubin 11/16/2019 1.3* 0.3 - 1.2 mg/dL Final  . GFR calc non Af Amer 11/16/2019 >60  >60 mL/min Final  . GFR calc Af Amer 11/16/2019 >60  >60 mL/min Final  . Anion gap 11/16/2019 11  5 - 15 Final   Performed at [facility], [address], [place], [place] [address]  . WBC 11/16/2019 6.5  4.0 - 10.5 K/uL Final  . RBC 11/16/2019 4.17* 4.22 - 5.81 MIL/uL Final  . Hemoglobin 11/16/2019 12.8* 13.0 - 17.0 g/dL Final  . HCT 11/16/2019 37.5* 39.0 - 52.0 % Final  . MCV 11/16/2019 89.9  80.0 - 100.0 fL Final  . MCH 11/16/2019 30.7  26.0 - 34.0 pg Final  . MCHC 11/16/2019 34.1  30.0 - 36.0 g/dL Final  . RDW 11/16/2019 12.9  11.5 - 15.5 % Final  . Platelets 11/16/2019 229  150 - 400 K/uL Final  . nRBC 11/16/2019 0.0  0.0 - 0.2 % Final   Performed at [facility], [address] [place], [place] [address]    Assessment:  David [name] is a 74 y.o. 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  He has been treated with cyclosporine (09/2006), Remicade (09/2006 - 2009; 01/05/2017), steroids, and Entyvio.    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  He did not tolerate mesalamine.  He developed joint and muscle pain on Remicade.  The patient has received Entyvio on 06/08/2017, 06/25/2017, 07/23/2017, 09/29/2017, 12/07/2017, 02/01/2018, 03/29/2018, 05/24/2018, 07/19/2018, 09/13/2018, 11/08/2018, 01/03/2019, 02/28/2019, 05/05/2019, 07/07/2019 and 09/15/2019.  Sigmoidoscopy in 09/25/2016 revealed moderate to  severe left-sided colitis.    Abdomen and pelvic CT on 09/28/2016 revealed diffuse colonic wall thickening from the proximal transverse colon to the rectum.  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   The patient has coronary artery disease s/p LAD stent, mid RCA occlusion and left ventricular thrombus. Cardiac MRI on 08/25/2017 revealed moderately dilated left ventricle with normal wall thickness and severely decreased systolic function (LVEF = 39%). There was akinesis of the mid inferior, inferolateral, anterior, anteroseptal and apical inferior, anterior and septal walls.   Symptomatically, he denies any GI symptoms. He notes back pain. Exam is stable.  Plan: 1.   Labs:  CBC with diff, CMP, CRP.  2.   Moderate to severe ulcerative colitis             Clinically, he is doing well  Entyvio today.  Continue Entyvio every 2 months. 3.   Liver function tests  AST 32 and ALT 26.  Bilirubin 0.7.  Alaline phosphatase 81 on 09/22/2018.  AST 28 and ALT 28.  Bilirubin 1.4.  Alaline phosphatase 75 on 04/27/2020.  AST 26 and ALT 26.  Bilirubin 0.9.  Alaline phosphatase 77 on 10/26/2019.  AST 32 and ALT 54. Bilirubin 0.9.   Alkaline phosphatase 102 on 02/22/2020.  Elevations of transaminase and/or bilirubin have been reported with Entyvio.   Plan to discontinue Entyvio with jaundice or other evidence of significant liver injury (fatigue, anorexia, right upper abdominal discomfort, or  dark urine).  Continue to monitor LFTs prior to each cycle. 4.   Entyvio today. 5.   RTC in 2 months for Entyvio. 6.   RTC in 4 months for MD assessment and Entyvio.  I discussed the assessment and treatment plan with the patient.  The patient was provided an opportunity to ask questions and all were answered.  The patient agreed with the plan and demonstrated  an understanding of the instructions.  The patient was advised to call back if the symptoms worsen or if the condition fails to improve as anticipated.    Lequita Asal, MD, PhD    03/01/2020, 9:45 AM  I, Heywood Footman, am acting as a Education administrator for Lequita Asal, MD.  I, Kendall Mike Gip, MD, have reviewed the above documentation for accuracy and completeness, and I agree with the above.

## 2020-03-01 ENCOUNTER — Inpatient Hospital Stay: Payer: Medicare Other

## 2020-03-01 ENCOUNTER — Other Ambulatory Visit: Payer: Self-pay | Admitting: *Deleted

## 2020-03-01 ENCOUNTER — Inpatient Hospital Stay: Payer: Medicare Other | Attending: Hematology and Oncology | Admitting: Hematology and Oncology

## 2020-03-01 ENCOUNTER — Other Ambulatory Visit: Payer: Self-pay

## 2020-03-01 VITALS — BP 134/73 | HR 82 | Temp 95.7°F | Resp 16 | Wt 183.8 lb

## 2020-03-01 DIAGNOSIS — K5190 Ulcerative colitis, unspecified, without complications: Secondary | ICD-10-CM | POA: Diagnosis present

## 2020-03-01 DIAGNOSIS — Z8249 Family history of ischemic heart disease and other diseases of the circulatory system: Secondary | ICD-10-CM | POA: Insufficient documentation

## 2020-03-01 DIAGNOSIS — K51919 Ulcerative colitis, unspecified with unspecified complications: Secondary | ICD-10-CM | POA: Diagnosis not present

## 2020-03-01 DIAGNOSIS — Z79899 Other long term (current) drug therapy: Secondary | ICD-10-CM | POA: Insufficient documentation

## 2020-03-01 MED ORDER — SODIUM CHLORIDE 0.9 % IV SOLN
Freq: Once | INTRAVENOUS | Status: AC
  Filled 2020-03-01: qty 250

## 2020-03-01 MED ORDER — VEDOLIZUMAB 300 MG IV SOLR
300.0000 mg | Freq: Once | INTRAVENOUS | Status: AC
  Administered 2020-03-01: 300 mg via INTRAVENOUS
  Filled 2020-03-01: qty 5

## 2020-03-01 NOTE — Progress Notes (Signed)
Patient is here for follow up of ulcerative colitis. He reports worsening back pain due to degenerative disc disease. He was seen by his primary care MD yesterday for follow up. His primary care MD referred him to [facility], but his appointment isn't until July. He was not prescribed any pain medicine, but was told to take Tylenol, which doesn't help at all. His pain is a 10 with any movement. Patient appears to be extremely depressed today.

## 2020-03-04 ENCOUNTER — Ambulatory Visit: Payer: Medicare Other | Admitting: Hematology and Oncology

## 2020-03-04 ENCOUNTER — Ambulatory Visit: Payer: Medicare Other

## 2020-03-13 ENCOUNTER — Ambulatory Visit: Payer: Medicare Other

## 2020-03-13 ENCOUNTER — Ambulatory Visit: Payer: Medicare Other | Admitting: Hematology and Oncology

## 2020-04-01 ENCOUNTER — Other Ambulatory Visit: Payer: Self-pay | Admitting: Physical Medicine and Rehabilitation

## 2020-04-01 DIAGNOSIS — M5416 Radiculopathy, lumbar region: Secondary | ICD-10-CM

## 2020-04-13 ENCOUNTER — Other Ambulatory Visit: Payer: Self-pay

## 2020-04-13 ENCOUNTER — Ambulatory Visit
Admission: RE | Admit: 2020-04-13 | Discharge: 2020-04-13 | Disposition: A | Discharge status: Home / Self Care | Payer: Medicare Other | Source: Ambulatory Visit | Attending: Physical Medicine and Rehabilitation | Admitting: Physical Medicine and Rehabilitation

## 2020-04-13 DIAGNOSIS — M5416 Radiculopathy, lumbar region: Secondary | ICD-10-CM | POA: Diagnosis present

## 2020-05-01 ENCOUNTER — Other Ambulatory Visit: Payer: Self-pay

## 2020-05-01 ENCOUNTER — Inpatient Hospital Stay: Payer: Medicare Other | Attending: Hematology and Oncology

## 2020-05-01 VITALS — BP 115/67 | HR 72 | Temp 97.0°F | Resp 18

## 2020-05-01 DIAGNOSIS — Z79899 Other long term (current) drug therapy: Secondary | ICD-10-CM | POA: Insufficient documentation

## 2020-05-01 DIAGNOSIS — R945 Abnormal results of liver function studies: Secondary | ICD-10-CM | POA: Diagnosis not present

## 2020-05-01 DIAGNOSIS — K5190 Ulcerative colitis, unspecified, without complications: Secondary | ICD-10-CM | POA: Insufficient documentation

## 2020-05-01 DIAGNOSIS — K51919 Ulcerative colitis, unspecified with unspecified complications: Secondary | ICD-10-CM

## 2020-05-01 MED ORDER — SODIUM CHLORIDE 0.9 % IV SOLN
Freq: Once | INTRAVENOUS | Status: AC
  Filled 2020-05-01: qty 250

## 2020-05-01 MED ORDER — VEDOLIZUMAB 300 MG IV SOLR
300.0000 mg | Freq: Once | INTRAVENOUS | Status: AC
  Administered 2020-05-01: 300 mg via INTRAVENOUS
  Filled 2020-05-01: qty 5

## 2020-05-08 ENCOUNTER — Telehealth: Payer: Self-pay

## 2020-05-08 DIAGNOSIS — K5100 Ulcerative (chronic) pancolitis without complications: Secondary | ICD-10-CM

## 2020-05-08 NOTE — Telephone Encounter (Signed)
Order labs. Called and left a detail message to inform patient he is due for lab work and go to any [facility] facility to have this done.

## 2020-05-08 NOTE — Telephone Encounter (Signed)
-----   Message from Rushie Chestnut, [place] sent at 11/22/2019 11:27 AM EST ----- Regarding: Pt is due for labs Pt is due for labs this month. Per 11-22-19 progress note, Order CBC, CMP, CRP, and TB Quantiferon

## 2020-06-26 ENCOUNTER — Other Ambulatory Visit: Payer: Self-pay

## 2020-06-26 ENCOUNTER — Ambulatory Visit: Payer: Medicare Other | Admitting: Gastroenterology

## 2020-06-26 VITALS — BP 156/88 | HR 71 | Temp 97.8°F | Ht 72.0 in | Wt 182.0 lb

## 2020-06-26 DIAGNOSIS — K5100 Ulcerative (chronic) pancolitis without complications: Secondary | ICD-10-CM

## 2020-06-26 NOTE — Progress Notes (Signed)
[name] MD, MRCP(U.K) [address]  [place]  [place], [place] [address]  Main: [phone]  Fax: [phone]   Primary Care Physician: Kirk Ruths, MD  Primary Gastroenterologist:  Dr. [name]   Follow-up for ulcerative pancolitis   HPI: David [name] is a 74 y.o. male   Summary of history :  Hehas had ulcerative colitis from 2007 . 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 Remicaid d/c in 2009 due to the malignant skin lesions. Since 2009 Not been on any medications. He was being followed by [facility] till 2015 when his doctor retired. Hospitalized in 2011 for flare of the colitis. His symptoms returned in early December 2017when I took over his care .  Sigmoidoscopy demonstrated severe left-sided colitis.  Commenced him on mesalamine which he did not tolerate and was admitted to the hospital with symptoms. 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  Thrombosis of the inferior mesenteric vein.  We wanted to transfer him to a tertiary center but he refused . He was seen at [facility] Dr Daphane Shepherd who specializes in IBD for a second opinion as he has had prior skin cancer with regards to options. She saw him on 12/01/16.Commenced on remicaid 01/05/17.3 days after the first infusion had no blood or diarrhea and firming up of stools.Commenced on Entyvio in 8/2018due to severe reaction to Remicade  11/16/2019: Hemoglobin 12.8 g with an MCV of 89.9, CMP normal, CRP normal.  Vitamin D normal.  Interval history2/07/2020-06/26/2020   Denies any diarrhea rectal bleeding or abdominal pain.  Receiving his Entyvio  infusions.  Brought his recent lab work with him performed at [facility] clinic which showed normal CMP as well as TSH.  No recent CBC performed  Did not have DEXA bone scan( ordered previously).Not immune to Hep A/B ,.previously reminded that he needs vaccinations for hepatitis A B, flu, pneumococcal pneumonia which she has refused including zoster vaccine.  Recently took his Covid shot and subsequently developed zoster on the right side possibly involving his eye.  He does not want acolonoscopy, well aware discussed at length that he is very high risk for colon cancer.       Current Outpatient Medications  Medication Sig Dispense Refill  . ACCU-CHEK AVIVA PLUS test strip 1 each by Other route as needed for other.     Marland Kitchen ACCU-CHEK SOFTCLIX LANCETS lancets USE 2 TIMES DAILY. USE AS INSTRUCTED.    Marland Kitchen aspirin EC 81 MG tablet Take 81 mg by mouth daily.     Marland Kitchen atorvastatin (LIPITOR) 40 MG tablet Take 40 mg by mouth 2 (two) times a day.     . carvedilol (COREG) 3.125 MG tablet Take 3.125 mg by mouth 2 (two) times daily with a meal.     . Cholecalciferol (D 5000) 125 MCG (5000 UT) capsule Take 5,000 Units by mouth daily.     . clopidogrel (PLAVIX) 75 MG tablet TAKE 1 TABLET BY MOUTH ONCE DAILY    . gabapentin (NEURONTIN) 300 MG capsule Take 300 mg by mouth 3 (three) times daily.     Marland Kitchen glipiZIDE (GLUCOTROL) 10 MG tablet Take 10 mg by mouth  2 (two) times daily.     Marland Kitchen levothyroxine (SYNTHROID) 75 MCG tablet Take 75 mcg by mouth daily before breakfast.    . Multiple Vitamins-Minerals (CENTRUM SILVER 50+MEN PO) Take by mouth.    . Omega-3 Fatty Acids (FISH OIL PO) Take 1,200 mg by mouth 2 (two) times daily.     Marland Kitchen omeprazole (PRILOSEC) 20 MG capsule TAKE 1 CAPSULE BY MOUTH  ONCE DAILY.  MAKE SURE TO  TAKE 12 HOURS APART FROM  PLAVIX.    Marland Kitchen Probiotic Product (PROBIOTIC DAILY PO) Take 1 capsule by mouth daily.     Marland Kitchen pyridOXINE (VITAMIN B-6) 100 MG tablet Take 100 mg by mouth daily.    . vedolizumab  (ENTYVIO) 300 MG injection Inject into the vein. Every 8 weeks 1 each 6  . vitamin B-12 (CYANOCOBALAMIN) 1000 MCG tablet Take 1,000 mcg by mouth daily.     No current facility-administered medications for this visit.    Allergies as of 06/26/2020 - Review Complete 11/22/2019  Allergen Reaction Noted  . Mesalamine Nausea Only and Nausea And Vomiting 12/01/2016    ROS:  General: Negative for anorexia, weight loss, fever, chills, fatigue, weakness. ENT: Negative for hoarseness, difficulty swallowing , nasal congestion. CV: Negative for chest pain, angina, palpitations, dyspnea on exertion, peripheral edema.  Respiratory: Negative for dyspnea at rest, dyspnea on exertion, cough, sputum, wheezing.  GI: See history of present illness. GU:  Negative for dysuria, hematuria, urinary incontinence, urinary frequency, nocturnal urination.  Endo: Negative for unusual weight change.    Physical Examination:   There were no vitals taken for this visit.  General: Well-nourished, well-developed in no acute distress.  Eyes: No icterus. Conjunctivae pink. Neuro: Alert and oriented x 3.  Grossly intact. Skin: Warm and dry, no jaundice.   Psych: Alert and cooperative, normal mood and affect.   Imaging Studies: No results found.  Assessment and Plan:   David [name] is a 74 y.o. y/o male here to follow up . He hasahistory of long standing ulcerative colitis- off treatment for many years and recent onset of symptoms in 2017 while off all therapy .Prior history of skin cancer (melanoma and he recalls basal cell ca) in the past Previously colectomy was discussed but he was not keen.  Commenced on infliximab on 01/05/17 . Hadsevere reactions after the infusionsand [facility] .  Clinically and biochemically in remission.He refuses DEXA bone scan, annual skin exam , immunizations and a colonoscopy . Discussed extensively over prior visits, he is aware of the high risk of  colon cancer from longstanding ulcerative colitis.  Not keen on colonoscopy again today    Reminded him that he also needs a DEXA scan which he told me he will think about the last time.   Plan  1.CBC, CRP 2.  Continue with Entyvio infusions   Dr [name]  MD,MRCP [facility]) Follow up in 6 months

## 2020-06-27 ENCOUNTER — Telehealth: Payer: Self-pay

## 2020-06-27 LAB — CBC WITH DIFFERENTIAL/PLATELET
Basophils Absolute: 0 10*3/uL (ref 0.0–0.2)
Basos: 1 %
EOS (ABSOLUTE): 0.1 10*3/uL (ref 0.0–0.4)
Eos: 2 %
Hematocrit: 38.3 % (ref 37.5–51.0)
Hemoglobin: 12.9 g/dL — ABNORMAL LOW (ref 13.0–17.7)
Immature Grans (Abs): 0 10*3/uL (ref 0.0–0.1)
Immature Granulocytes: 0 %
Lymphocytes Absolute: 1.7 10*3/uL (ref 0.7–3.1)
Lymphs: 29 %
MCH: 30.9 pg (ref 26.6–33.0)
MCHC: 33.7 g/dL (ref 31.5–35.7)
MCV: 92 fL (ref 79–97)
Monocytes Absolute: 0.7 10*3/uL (ref 0.1–0.9)
Monocytes: 11 %
Neutrophils Absolute: 3.4 10*3/uL (ref 1.4–7.0)
Neutrophils: 57 %
Platelets: 269 10*3/uL (ref 150–450)
RBC: 4.17 x10E6/uL (ref 4.14–5.80)
RDW: 14.1 % (ref 11.6–15.4)
WBC: 5.9 10*3/uL (ref 3.4–10.8)

## 2020-06-27 LAB — C-REACTIVE PROTEIN: CRP: <1 mg/L (ref 0–10)

## 2020-06-27 NOTE — Progress Notes (Signed)
Jadijah inform labs look stable

## 2020-06-27 NOTE — Telephone Encounter (Signed)
-----   Message from [name], MD sent at 06/27/2020  8:04 AM EDT ----- Sherald Hess inform labs look stable

## 2020-07-01 NOTE — Progress Notes (Signed)
[facility]  [address] [place], [place] [address] Phone: [phone]  Fax: [phone]   Clinic Day:  07/02/2020  Referring physician: Kirk Ruths, MD  Chief Complaint: David [name] is a 74 y.o. 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    HPI: The patient was last seen in the medical oncology clinic on 03/01/2020. At that time, he denied any GI symptoms. He noted back pain. Exam was stable.   He received Entyvio on 03/01/2020 and 05/01/2020.   LFTs at the [facility] on 06/20/2020 included an AST of 28, ALT 27, alkaline phosphatase 90, and bilirubin 1.2.  Albumin was 4.3.    He was seen by Dr. Vicente Males on 06/26/2020.  He denied any diarrhea, rectal bleeding or abdominal pain.  He refused bone scan, annual skin exam, immunizations and colonoscopy.  CBC revealed a hematocrit of 38.3, hemoglobin 12.9, MCV 92, platelets 269,000, WBC 5900 with an ANC of 3400.  CRP was < 1. Plan was to continue Entyvio. The plan is for a 6 month follow up.   During the interim, he has been "ok". He appears frustrated today. He feels as if he shouldn't have to get a physical workup done because he had two prior recent workups by doctors, including one the day prior by his primary care. He is restitant about getting a physical today and stated that if he fell getting up onto the exam table he would sue. He notes that if there was a different location to get his infusions, he would go there instead.    Past Medical History:  Diagnosis Date  . Anemia   . CAD (coronary artery disease) 07/08/2011   Overview:  a. 07/2006: Anterior ST elevation MI. b. 07/2006: PCI with BMS to LAD and RCA. c. 09/2006: Non ST elevation. d. LVEF 30%.  Discussed ICD, not currently interested.   . 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 ([place]) 01/02/4009  . Dyslipidemia 07/30/2011   Overview:  High  triglycerides  . GERD (gastroesophageal reflux disease) 07/08/2011  . History of Clostridium difficile colitis   . History of myocardial infarction   . Hyperlipidemia, unspecified 07/08/2011  . Hypothyroidism   . 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 Type II diabetes mellitus, uncontrolled ([place]) 07/08/2011  . Vitamin D deficiency     Past Surgical History:  Procedure Laterality Date  . CARDIAC CATHETERIZATION    . CHOLECYSTECTOMY    . COLONOSCOPY  07/20/2006   Crohn's disease  . CORONARY ANGIOGRAPHY N/A 07/07/2017   Procedure: CORONARY ANGIOGRAPHY;  Surgeon: Teodoro Spray, MD;  Location: [place] [facility];  Service: Cardiovascular;  Laterality: N/A;  . CORONARY STENT PLACEMENT    . FLEXIBLE SIGMOIDOSCOPY N/A 09/25/2016   Procedure: FLEXIBLE SIGMOIDOSCOPY;  Surgeon: [name], MD;  Location: [facility] ENDOSCOPY;  Service: Endoscopy;  Laterality: N/A;  . LEFT HEART CATH Left 07/07/2017   Procedure: Left Heart Cath;  Surgeon: Teodoro Spray, MD;  Location: [place] [facility];  Service: Cardiovascular;  Laterality: Left;  Marland Kitchen MELANOMA REMOVED    . parotid gland removal      Family History  Problem Relation Age of Onset  . Heart disease Mother   . Heart attack Father   . Heart disease Sister   . Heart disease Brother     Social History:  reports that he has never smoked. He has never used smokeless  tobacco. He reports that he does not drink alcohol and does not use drugs.  He lives in [place]. The patient is alone today.   Allergies:  Allergies  Allergen Reactions  . Mesalamine Nausea Only and Nausea And Vomiting    Current Medications: Current Outpatient Medications  Medication Sig Dispense Refill  . ACCU-CHEK AVIVA PLUS test strip 1 each by Other route as needed for other.     Marland Kitchen ACCU-CHEK SOFTCLIX LANCETS lancets USE 2 TIMES DAILY. USE AS INSTRUCTED.    Marland Kitchen aspirin EC 81 MG tablet Take 81 mg by mouth daily.     Marland Kitchen atorvastatin (LIPITOR) 40 MG tablet  Take 40 mg by mouth 2 (two) times a day.     . carvedilol (COREG) 3.125 MG tablet Take 3.125 mg by mouth 2 (two) times daily with a meal.     . Cholecalciferol (D 5000) 125 MCG (5000 UT) capsule Take 5,000 Units by mouth daily.     . clopidogrel (PLAVIX) 75 MG tablet TAKE 1 TABLET BY MOUTH ONCE DAILY    . gabapentin (NEURONTIN) 400 MG capsule Take 400 mg by mouth 3 (three) times daily.    Marland Kitchen glipiZIDE (GLUCOTROL) 10 MG tablet Take 10 mg by mouth 2 (two) times daily.     Marland Kitchen levothyroxine (SYNTHROID) 75 MCG tablet Take 75 mcg by mouth daily before breakfast.    . Multiple Vitamins-Minerals (CENTRUM SILVER 50+MEN PO) Take by mouth.    . Omega-3 Fatty Acids (FISH OIL PO) Take 1,200 mg by mouth 2 (two) times daily.     Marland Kitchen omeprazole (PRILOSEC) 20 MG capsule TAKE 1 CAPSULE BY MOUTH  ONCE DAILY.  MAKE SURE TO  TAKE 12 HOURS APART FROM  PLAVIX.    Marland Kitchen Probiotic Product (PROBIOTIC DAILY PO) Take 1 capsule by mouth daily.     . vedolizumab (ENTYVIO) 300 MG injection Inject into the vein. Every 8 weeks 1 each 6  . gabapentin (NEURONTIN) 300 MG capsule Take 300 mg by mouth 3 (three) times daily.  (Patient not taking: Reported on 07/02/2020)    . pyridOXINE (VITAMIN B-6) 100 MG tablet Take 100 mg by mouth daily. (Patient not taking: Reported on 07/02/2020)    . vitamin B-12 (CYANOCOBALAMIN) 1000 MCG tablet Take 1,000 mcg by mouth daily. (Patient not taking: Reported on 07/02/2020)     No current facility-administered medications for this visit.    Review of Systems  Constitutional: Positive for weight loss (1 lb). Negative for chills, fever and malaise/fatigue.       Feels "ok".  HENT: Negative.  Negative for congestion, ear pain, hearing loss, sore throat and tinnitus.   Eyes: Negative.  Negative for blurred vision and double vision.  Respiratory: Negative.  Negative for cough, hemoptysis and sputum production.   Cardiovascular: Negative.  Negative for chest pain, palpitations and leg swelling.   Gastrointestinal: Negative.  Negative for abdominal pain, blood in stool, constipation, diarrhea, heartburn, melena, nausea and vomiting.       Bowels are good.  Genitourinary: Negative.  Negative for dysuria, frequency, hematuria and urgency.  Musculoskeletal: Positive for back pain. Negative for falls, joint pain, myalgias and neck pain.  Skin: Negative.  Negative for rash.  Neurological: Negative.  Negative for dizziness, tingling, sensory change, speech change, weakness and headaches.  Endo/Heme/Allergies: Negative.  Does not bruise/bleed easily.  Psychiatric/Behavioral: Negative.  Negative for depression and memory loss. The patient is not nervous/anxious and does not have insomnia.   All other systems reviewed and are  negative.  Performance status (ECOG): 0  Vitals Blood pressure (!) 161/77, pulse 69, temperature (!) 95.3 F (35.2 C), temperature source Tympanic, resp. rate 18, weight 182 lb 15.7 oz (83 kg), SpO2 99 %.   Physical Exam Vitals and nursing note reviewed.  Constitutional:      General: He is not in acute distress.    Appearance: He is well-developed. He is not diaphoretic.  HENT:     Head: Normocephalic and atraumatic.     Comments: White hair.  Male pattern baldness.    Mouth/Throat:     Mouth: Mucous membranes are moist.     Pharynx: Oropharynx is clear. No oropharyngeal exudate or posterior oropharyngeal erythema.  Eyes:     General: No scleral icterus.    Conjunctiva/sclera: Conjunctivae normal.     Pupils: Pupils are equal, round, and reactive to light.     Comments: Gold rimmed glasses. Blue eyes.  Neck:     Vascular: No JVD.  Cardiovascular:     Rate and Rhythm: Normal rate and regular rhythm.     Heart sounds: Normal heart sounds. No murmur heard.  No friction rub. No gallop.   Pulmonary:     Effort: No respiratory distress.     Breath sounds: Normal breath sounds. No wheezing or rales.  Chest:     Chest wall: No tenderness.  Abdominal:      General: Bowel sounds are normal. There is no distension.     Palpations: Abdomen is soft. There is no hepatomegaly, splenomegaly or mass.     Tenderness: There is no abdominal tenderness. There is no guarding or rebound.  Musculoskeletal:        General: No tenderness.     Cervical back: Normal range of motion and neck supple.  Lymphadenopathy:     Head:     Right side of head: No preauricular, posterior auricular or occipital adenopathy.     Left side of head: No preauricular, posterior auricular or occipital adenopathy.     Cervical: No cervical adenopathy.     Upper Body:     Right upper body: No supraclavicular or axillary adenopathy.     Left upper body: No supraclavicular or axillary adenopathy.     Lower Body: No right inguinal adenopathy. No left inguinal adenopathy.  Skin:    General: Skin is warm and dry.     Coloration: Skin is not pale.     Findings: No erythema or rash.  Neurological:     Mental Status: He is alert and oriented to person, place, and time.  Psychiatric:        Mood and Affect: Mood normal.        Thought Content: Thought content normal.        Judgment: Judgment normal.    No visits with results within 3 Day(s) from this visit.  Latest known visit with results is:  Office Visit on 06/26/2020  Component Date Value Ref Range Status  . WBC 06/26/2020 5.9  3.4 - 10.8 x10E3/uL Final  . RBC 06/26/2020 4.17  4.14 - 5.80 x10E6/uL Final  . Hemoglobin 06/26/2020 12.9* 13.0 - 17.7 g/dL Final  . Hematocrit 06/26/2020 38.3  37.5 - 51.0 % Final  . MCV 06/26/2020 92  79 - 97 fL Final  . MCH 06/26/2020 30.9  26.6 - 33.0 pg Final  . MCHC 06/26/2020 33.7  31 - 35 g/dL Final  . RDW 06/26/2020 14.1  11.6 - 15.4 % Final  . Platelets 06/26/2020  269  150 - 450 x10E3/uL Final  . Neutrophils 06/26/2020 57  Not Estab. % Final  . Lymphs 06/26/2020 29  Not Estab. % Final  . Monocytes 06/26/2020 11  Not Estab. % Final  . Eos 06/26/2020 2  Not Estab. % Final  . Basos  06/26/2020 1  Not Estab. % Final  . Neutrophils Absolute 06/26/2020 3.4  1 - 7 x10E3/uL Final  . Lymphocytes Absolute 06/26/2020 1.7  0 - 3 x10E3/uL Final  . Monocytes Absolute 06/26/2020 0.7  0 - 0 x10E3/uL Final  . EOS (ABSOLUTE) 06/26/2020 0.1  0.0 - 0.4 x10E3/uL Final  . Basophils Absolute 06/26/2020 0.0  0 - 0 x10E3/uL Final  . Immature Granulocytes 06/26/2020 0  Not Estab. % Final  . Immature Grans (Abs) 06/26/2020 0.0  0.0 - 0.1 x10E3/uL Final  . CRP 06/26/2020 <1  0 - 10 mg/L Final    Assessment:  David [name] is a 74 y.o. 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  He has been treated with cyclosporine (09/2006), Remicade (09/2006 - 2009; 01/05/2017), steroids, and Entyvio.    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  He did not tolerate mesalamine.  He developed joint and muscle pain on Remicade.  The patient has received Entyvio on 06/08/2017, 06/25/2017, 07/23/2017, 09/29/2017, 12/07/2017, 02/01/2018, 03/29/2018, 05/24/2018, 07/19/2018, 09/13/2018, 11/08/2018, 01/03/2019, 02/28/2019, 05/05/2019, 07/07/2019, 09/15/2019, 03/01/2020, and 05/01/2020.  Sigmoidoscopy in 09/25/2016 revealed moderate to severe left-sided colitis.    Abdomen and pelvic CT on 09/28/2016 revealed diffuse colonic wall thickening from the proximal transverse colon to the rectum.  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   The patient has coronary artery disease s/p LAD stent, mid RCA occlusion and left ventricular thrombus. Cardiac MRI on 08/25/2017 revealed moderately dilated left ventricle with normal wall thickness and severely decreased systolic function (LVEF = 39%). There was akinesis of the mid inferior, inferolateral, anterior, anteroseptal and apical inferior, anterior and  septal walls.   Symptomatically, he notes normal bowels.  Exam is stable.  Plan: 1.   Review labs from 06/20/2020 [facility]) and 06/26/2020 [facility]  2.   Moderate to severe ulcerative colitis             Clinically, he appears to be doing well.  Entyvio today.  Continue Entyvio every 2 months. 3.   Liver function tests  AST 32 and ALT 26.  Bilirubin 0.7.  Alkaline phosphatase   81 on 09/22/2018.  AST 28 and ALT 28.  Bilirubin 1.4.  Alkaline phosphatase   75 on 04/27/2020.  AST 26 and ALT 26.  Bilirubin 0.9.  Alkaline phosphatase   77 on 10/26/2019.  AST 32 and ALT 54.  Bilirubin 0.9.  Alkaline phosphatase 102 on 02/22/2020.  AST 28 and ALT 27.  Bilirubin 1.2.  Alkaline phosphatase   90 on 06/20/2020.  Continue to monitor Entyvio prior to treatments per GI. 4.   Entyvio today. 5.   Labs to be drawn by PCP or GI per Dr Georgeann Oppenheim protocol. 6.   RTC in 2 months and 4 months for Entyvio. 7.   RTC in 6 months for MD assessment and Entyvio.  I discussed the assessment and treatment plan with the patient.  The patient was provided an opportunity to ask questions and all were answered.  The patient agreed with the  plan and demonstrated an understanding of the instructions.  The patient was advised to call back if the symptoms worsen or if the condition fails to improve as anticipated.    Lequita Asal, MD, PhD    07/02/2020, 9:53 PM  I, Jacqualyn Posey, am acting as a Education administrator for [employer]. Mike Gip, MD.   I, [place] Luppino C. Mike Gip, MD, have reviewed the above documentation for accuracy and completeness, and I agree with the above.

## 2020-07-02 ENCOUNTER — Inpatient Hospital Stay: Payer: Medicare Other

## 2020-07-02 ENCOUNTER — Other Ambulatory Visit: Payer: Self-pay

## 2020-07-02 ENCOUNTER — Inpatient Hospital Stay: Payer: Medicare Other | Attending: Hematology and Oncology | Admitting: Hematology and Oncology

## 2020-07-02 ENCOUNTER — Encounter: Payer: Self-pay | Admitting: Hematology and Oncology

## 2020-07-02 VITALS — BP 161/77 | HR 69 | Temp 95.3°F | Resp 18 | Wt 183.0 lb

## 2020-07-02 DIAGNOSIS — T451X5A Adverse effect of antineoplastic and immunosuppressive drugs, initial encounter: Secondary | ICD-10-CM | POA: Diagnosis not present

## 2020-07-02 DIAGNOSIS — K5100 Ulcerative (chronic) pancolitis without complications: Secondary | ICD-10-CM | POA: Insufficient documentation

## 2020-07-02 DIAGNOSIS — Z9049 Acquired absence of other specified parts of digestive tract: Secondary | ICD-10-CM | POA: Diagnosis not present

## 2020-07-02 DIAGNOSIS — Z85820 Personal history of malignant melanoma of skin: Secondary | ICD-10-CM | POA: Diagnosis not present

## 2020-07-02 DIAGNOSIS — G720 Drug-induced myopathy: Secondary | ICD-10-CM | POA: Diagnosis not present

## 2020-07-02 DIAGNOSIS — I2510 Atherosclerotic heart disease of native coronary artery without angina pectoris: Secondary | ICD-10-CM | POA: Diagnosis not present

## 2020-07-02 DIAGNOSIS — M549 Dorsalgia, unspecified: Secondary | ICD-10-CM | POA: Diagnosis not present

## 2020-07-02 DIAGNOSIS — M791 Myalgia, unspecified site: Secondary | ICD-10-CM | POA: Diagnosis not present

## 2020-07-02 DIAGNOSIS — Z8249 Family history of ischemic heart disease and other diseases of the circulatory system: Secondary | ICD-10-CM | POA: Diagnosis not present

## 2020-07-02 DIAGNOSIS — K51919 Ulcerative colitis, unspecified with unspecified complications: Secondary | ICD-10-CM | POA: Diagnosis not present

## 2020-07-02 DIAGNOSIS — L649 Androgenic alopecia, unspecified: Secondary | ICD-10-CM | POA: Insufficient documentation

## 2020-07-02 DIAGNOSIS — Z8619 Personal history of other infectious and parasitic diseases: Secondary | ICD-10-CM | POA: Insufficient documentation

## 2020-07-02 DIAGNOSIS — I252 Old myocardial infarction: Secondary | ICD-10-CM | POA: Diagnosis not present

## 2020-07-02 DIAGNOSIS — Z79899 Other long term (current) drug therapy: Secondary | ICD-10-CM | POA: Insufficient documentation

## 2020-07-02 MED ORDER — SODIUM CHLORIDE 0.9 % IV SOLN
Freq: Once | INTRAVENOUS | Status: AC
  Filled 2020-07-02: qty 250

## 2020-07-02 MED ORDER — VEDOLIZUMAB 300 MG IV SOLR
300.0000 mg | Freq: Once | INTRAVENOUS | Status: AC
  Administered 2020-07-02: 300 mg via INTRAVENOUS
  Filled 2020-07-02: qty 5

## 2020-07-02 NOTE — Progress Notes (Signed)
Patient states nothing has taste anymore and he is having trouble sleeping

## 2020-07-24 ENCOUNTER — Other Ambulatory Visit: Payer: Self-pay

## 2020-07-24 MED ORDER — VEDOLIZUMAB 300 MG IV SOLR
INTRAVENOUS | 6 refills | Status: DC
Start: 2020-07-24 — End: 2021-11-13

## 2020-08-20 NOTE — Progress Notes (Signed)
..  The following Medication: Weyman Rodney has been approved thru Entyvio PAP as Assistance Program. Enrollment period is 08/14/2020 to 10/11/2021.  Assistance ID: [id number]. Reason for Assistance: EOOP First DOS: 09/02/2020 This was renewal of Assistance.

## 2020-09-02 ENCOUNTER — Other Ambulatory Visit: Payer: Self-pay

## 2020-09-02 ENCOUNTER — Inpatient Hospital Stay: Payer: Medicare Other | Attending: Hematology and Oncology

## 2020-09-02 VITALS — BP 136/74 | HR 70 | Temp 96.4°F | Resp 18

## 2020-09-02 DIAGNOSIS — K5190 Ulcerative colitis, unspecified, without complications: Secondary | ICD-10-CM | POA: Diagnosis present

## 2020-09-02 DIAGNOSIS — M549 Dorsalgia, unspecified: Secondary | ICD-10-CM | POA: Insufficient documentation

## 2020-09-02 DIAGNOSIS — K51919 Ulcerative colitis, unspecified with unspecified complications: Secondary | ICD-10-CM

## 2020-09-02 DIAGNOSIS — Z8249 Family history of ischemic heart disease and other diseases of the circulatory system: Secondary | ICD-10-CM | POA: Insufficient documentation

## 2020-09-02 DIAGNOSIS — Z79899 Other long term (current) drug therapy: Secondary | ICD-10-CM | POA: Diagnosis not present

## 2020-09-02 MED ORDER — SODIUM CHLORIDE 0.9 % IV SOLN
Freq: Once | INTRAVENOUS | Status: AC
  Filled 2020-09-02: qty 250

## 2020-09-02 MED ORDER — VEDOLIZUMAB 300 MG IV SOLR
300.0000 mg | Freq: Once | INTRAVENOUS | Status: AC
  Administered 2020-09-02: 300 mg via INTRAVENOUS
  Filled 2020-09-02: qty 5

## 2020-09-02 NOTE — Progress Notes (Signed)
Patient received prescribed treatment in clinic. Tolerated well. Patient stable at discharge. 

## 2020-11-04 ENCOUNTER — Inpatient Hospital Stay: Payer: Medicare Other | Attending: Hematology and Oncology

## 2020-11-04 ENCOUNTER — Other Ambulatory Visit: Payer: Self-pay

## 2020-11-04 VITALS — BP 159/78 | HR 71 | Temp 95.6°F | Resp 18

## 2020-11-04 DIAGNOSIS — K51919 Ulcerative colitis, unspecified with unspecified complications: Secondary | ICD-10-CM

## 2020-11-04 DIAGNOSIS — Z8249 Family history of ischemic heart disease and other diseases of the circulatory system: Secondary | ICD-10-CM | POA: Insufficient documentation

## 2020-11-04 DIAGNOSIS — K5190 Ulcerative colitis, unspecified, without complications: Secondary | ICD-10-CM | POA: Diagnosis present

## 2020-11-04 DIAGNOSIS — Z79899 Other long term (current) drug therapy: Secondary | ICD-10-CM | POA: Insufficient documentation

## 2020-11-04 MED ORDER — SODIUM CHLORIDE 0.9 % IV SOLN
Freq: Once | INTRAVENOUS | Status: AC
  Filled 2020-11-04: qty 250

## 2020-11-04 MED ORDER — VEDOLIZUMAB 300 MG IV SOLR
300.0000 mg | Freq: Once | INTRAVENOUS | Status: AC
  Administered 2020-11-04: 300 mg via INTRAVENOUS
  Filled 2020-11-04: qty 5

## 2020-11-04 NOTE — Progress Notes (Signed)
Patient tolerated prescribed treatment, vital signs stable, discharged to home.

## 2020-12-30 ENCOUNTER — Ambulatory Visit: Payer: Medicare Other | Admitting: Hematology and Oncology

## 2020-12-30 ENCOUNTER — Ambulatory Visit: Payer: Medicare Other

## 2021-01-01 ENCOUNTER — Ambulatory Visit: Payer: Medicare Other | Admitting: Gastroenterology

## 2021-01-01 ENCOUNTER — Other Ambulatory Visit: Payer: Self-pay

## 2021-01-01 VITALS — BP 146/79 | HR 66 | Ht 72.0 in | Wt 185.0 lb

## 2021-01-01 DIAGNOSIS — K5100 Ulcerative (chronic) pancolitis without complications: Secondary | ICD-10-CM

## 2021-01-01 NOTE — Progress Notes (Signed)
[name] MD, MRCP(U.K) [address]  [place]  [place], [place] [address]  Main: [phone]  Fax: [phone]   Primary Care Physician: Kirk Ruths, MD  Primary Gastroenterologist:  Dr. [name]   Ulcerative Colitis follow up   HPI: David [name] is a 75 y.o. male   Summary of history :  Hehas had ulcerative colitis from 2007 . 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 Remicaid d/c in 2009 due to the malignant skin lesions. Since 2009 Not been on any medications. He was being followed by [facility] till 2015 when his doctor retired. Hospitalized in 2011 for flare of the colitis. His symptoms returned in early December 2017when I took over his care .Sigmoidoscopy demonstrated severe left-sided colitis. Commenced him on mesalamine which he did not tolerate and was admitted to the hospital with symptoms. 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 Thrombosis of the inferior mesenteric vein. We wanted to transfer him to a tertiary center but he refused . He was seen at [facility] Dr Daphane Shepherd who specializes in IBD for a second opinion as he has had prior skin cancer with regards to options. She saw him on 12/01/16.Commenced on remicaid 01/05/17.3 days after the first infusion had no blood or diarrhea and firming up of stools.Commenced on Entyvio in 8/2018due to severe reactionto Remicade.  In the past on multiple occasions  vaccination for hepatitis a and B pneumococcal vaccination.  On multiple occasions have discussed about colon cancer cancer screening, dysplasia surveillance with a colonoscopy and explained the  very high risk of colon cancer in his case and strong recommendations to undergo the same but he has refused every single time.  11/16/2019: Hemoglobin 12.8 g with an MCV of 89.9, CMP normal, CRP normal. Vitamin D normal.  Interval history9/15/2021-01/01/2021  He has developed vision issues after his episode of shingles which appears to affected his eye.  Noted recent lab work at [place] clinic which does not include a CBC or vitamin D.  He repeatedly mentions he does not want to go to [facility] for his infusions.  I explained to him that his insurance did not cover home infusions previously but we will try with a new company.  He repeatedly mentioned that he wants to stop taking his Entyvio as it is causing him numbness in his fingers.  He presently has no GI issues and having normal bowel movements no rectal bleeding  He underwent a DEXA scan at [facility] clinic in February 2020 which shows osteopenia   Current Outpatient Medications  Medication Sig Dispense Refill  . ACCU-CHEK AVIVA PLUS test strip 1 each by Other route as needed for other.     Marland Kitchen ACCU-CHEK SOFTCLIX LANCETS lancets USE 2 TIMES DAILY. USE AS INSTRUCTED.    Marland Kitchen aspirin EC 81 MG tablet Take 81 mg by mouth daily.     Marland Kitchen atorvastatin (LIPITOR) 40 MG tablet Take 40 mg by mouth 2 (two) times a day.     . carvedilol (COREG) 3.125 MG tablet Take 3.125 mg by mouth 2 (two) times daily with a meal.     . Cholecalciferol (D 5000) 125 MCG (5000  UT) capsule Take 5,000 Units by mouth daily.     . clopidogrel (PLAVIX) 75 MG tablet TAKE 1 TABLET BY MOUTH ONCE DAILY    . gabapentin (NEURONTIN) 300 MG capsule Take 300 mg by mouth 3 (three) times daily.  (Patient not taking: Reported on 07/02/2020)    . gabapentin (NEURONTIN) 400 MG capsule Take 400 mg by mouth 3 (three) times daily.    Marland Kitchen glipiZIDE (GLUCOTROL) 10 MG tablet Take 10 mg by mouth 2 (two) times daily.     Marland Kitchen levothyroxine (SYNTHROID) 75 MCG tablet Take 75 mcg by mouth daily before  breakfast.    . Multiple Vitamins-Minerals (CENTRUM SILVER 50+MEN PO) Take by mouth.    . Omega-3 Fatty Acids (FISH OIL PO) Take 1,200 mg by mouth 2 (two) times daily.     Marland Kitchen omeprazole (PRILOSEC) 20 MG capsule TAKE 1 CAPSULE BY MOUTH  ONCE DAILY.  MAKE SURE TO  TAKE 12 HOURS APART FROM  PLAVIX.    Marland Kitchen Probiotic Product (PROBIOTIC DAILY PO) Take 1 capsule by mouth daily.     Marland Kitchen pyridOXINE (VITAMIN B-6) 100 MG tablet Take 100 mg by mouth daily. (Patient not taking: Reported on 07/02/2020)    . vedolizumab (ENTYVIO) 300 MG injection Inject into the vein. Every 8 weeks 1 each 6  . vitamin B-12 (CYANOCOBALAMIN) 1000 MCG tablet Take 1,000 mcg by mouth daily. (Patient not taking: Reported on 07/02/2020)     No current facility-administered medications for this visit.    Allergies as of 01/01/2021 - Review Complete 01/01/2021  Allergen Reaction Noted  . Mesalamine Nausea Only and Nausea And Vomiting 12/01/2016    ROS:  General: Negative for anorexia, weight loss, fever, chills, fatigue, weakness. ENT: Negative for hoarseness, difficulty swallowing , nasal congestion. CV: Negative for chest pain, angina, palpitations, dyspnea on exertion, peripheral edema.  Respiratory: Negative for dyspnea at rest, dyspnea on exertion, cough, sputum, wheezing.  GI: See history of present illness. GU:  Negative for dysuria, hematuria, urinary incontinence, urinary frequency, nocturnal urination.  Endo: Negative for unusual weight change.    Physical Examination:   BP (!) 146/79   Pulse 66   Ht 6' (1.829 m)   Wt 185 lb (83.9 kg)   BMI 25.09 kg/m   General: Well-nourished, well-developed in no acute distress.  Mouth: Oropharyngeal mucosa moist and pink , no lesions erythema or exudate. Lungs: Clear to auscultation bilaterally. Non-labored. Heart: Regular rate and rhythm, no murmurs rubs or gallops.  Abdomen: Bowel sounds are normal, nontender, nondistended, no hepatosplenomegaly or masses, no abdominal bruits  or hernia , no rebound or guarding.   Extremities: No lower extremity edema. No clubbing or deformities. Neuro: Alert and oriented x 3.  Grossly intact. Skin: Warm and dry, no jaundice.   Psych: Alert and cooperative, normal mood and affect.   Imaging Studies: No results found.  Assessment and Plan:   David [name] is a 75 y.o. y/o malehere to follow up . He hasahistory of long standing ulcerative colitis- off treatment for many years and recent onset of symptoms in 2017 while off all therapy .Prior history of skin cancer (melanoma and he recalls basal cell ca) in the past Previously colectomy was discussed but he was not keen.  Commenced on infliximab on 01/05/17 . Hadsevere reactions after the infusionsand decided tochange to [facility] .Clinically and biochemically in remission.He has been previously recommended annual skin exam , immunizations and a colonoscopy . Discussed extensively over prior visits, he is  aware of the high risk of colon cancerfrom longstanding ulcerative colitis.  Not keen on colonoscopy again today.  Repeatedly mentioned he wants to stop his Entyvio infusions and I explained that with his prior history of severe colitis requiring him to undergo hospitalization and on multiple occasions he is on the verge of undergoing a complete colectomy I strongly suggest him to not do so.  I did explain to him there are not many treatment options if he were to get a relapse while doing extremely well on Entyvio.  The only option I could think about would be a complete colectomy which she has refused adamantly in the past.  I gave him the option of care at [facility] if he were to wish to get another opinion.  Presently he is clinically and biochemically in remission.  I am unaware of his endoscopy status as he has refused to undergo colonoscopy   Plan  1.CBC, vitamin D. 2.  Continue with Entyvio infusions, I will try to see if we can get his infusion  scheduled at home.  We have discussed that the infusions have been intending purpose to decrease the inflammation by suppressing the immune system in the gut.  Other side effects do include systemic immunosuppression and hence he has been recommended in the past vaccinations including the pneumococcal vaccination which she is yet to get to prevent pneumonia.     Dr [name]  MD,MRCP [facility]) Follow up in 6 to 8 months

## 2021-01-02 NOTE — Progress Notes (Signed)
[facility]  [address] [place], [place] [address] Phone: [phone]  Fax: [phone]   Clinic Day: 01/06/21  Referring physician: Kirk Ruths, MD  Chief Complaint: David [name] is a 75 y.o. 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    HPI: The patient was last seen in the medical oncology clinic on 07/02/2020. At that time, he noted normal bowels.  Exam was stable. Hematocrit was 38.3, hemoglobin 12.9, MCV 92.0, platelets 269,000, WBC 5,900. CRP was <1. He received Entyvio.  He received Entyvio on 09/02/2020 and 11/04/2020.  Bone density ([facility]) on 11/28/2020 revealed osteopenia with a T score of -2.1 at the left femur neck.  The patient saw Dr. Vicente Males on 01/01/2021. Hematocrit was 36.6, hemoglobin 12.9, MCV 90.0, platelets 259,000, WBC 6,900. The patient stated that he did not want to go to [facility] for infusions and wanted to stop Entyvio infusions completely because it caused neuropathy in his fingers. Dr Vicente Males discussed the risks of stopping Entyvio. The patient continued to decline a colonoscopy. They discussed trying to get Entyvio infusions at home, though his insurance has not allowed this in the past.  CMP on 11/12/2020 at [facility] was normal. CBC with diff on 01/01/2021 revealed a hematocrit of 36.6, hemoglobin 12.9, MCV 90, platelets 259,000, WBC 6900 (ANC 4000).  During the interim, he has been "not very well." He has back pain due to degenerative disk disease. He has received steroid injections, but they did not help. He has pain and numbness in his hands due to diabetic neuropathy.  He had an episode of diarrhea over the weekend but this is rare for him. He does not know what caused it. 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 The shingles occurred right after his COVID-19 vaccination. He got a pneumonia shot last month and  it caused blisters on his head. He has lost 8 teeth since starting Entyvio.  The patient agrees to [facility] today.   Past Medical History:  Diagnosis Date  . Anemia   . CAD (coronary artery disease) 07/08/2011   Overview:  a. 07/2006: Anterior ST elevation MI. b. 07/2006: PCI with BMS to LAD and RCA. c. 09/2006: Non ST elevation. d. LVEF 30%.  Discussed ICD, not currently interested.   . 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 ([place]) 9/39/0300  . Dyslipidemia 07/30/2011   Overview:  High triglycerides  . GERD (gastroesophageal reflux disease) 07/08/2011  . History of Clostridium difficile colitis   . History of myocardial infarction   . Hyperlipidemia, unspecified 07/08/2011  . Hypothyroidism   . 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 Type II diabetes mellitus, uncontrolled ([place]) 07/08/2011  . Vitamin D deficiency     Past Surgical History:  Procedure Laterality Date  . CARDIAC CATHETERIZATION    . CHOLECYSTECTOMY    . COLONOSCOPY  07/20/2006   Crohn's disease  . CORONARY ANGIOGRAPHY N/A 07/07/2017   Procedure: CORONARY ANGIOGRAPHY;  Surgeon: Teodoro Spray, MD;  Location: [facility];  Service: Cardiovascular;  Laterality: N/A;  . CORONARY STENT PLACEMENT    . FLEXIBLE SIGMOIDOSCOPY N/A 09/25/2016   Procedure: FLEXIBLE SIGMOIDOSCOPY;  Surgeon: [name], MD;  Location: [facility] ENDOSCOPY;  Service: Endoscopy;  Laterality: N/A;  . LEFT HEART CATH Left 07/07/2017   Procedure: Left Heart Cath;  Surgeon: Teodoro Spray, MD;  Location: [place] [facility];  Service: Cardiovascular;  Laterality: Left;  Marland Kitchen MELANOMA REMOVED    . parotid gland removal      Family History  Problem Relation Age of Onset  . Heart disease Mother   . Heart attack Father   . Heart disease Sister   . Heart disease Brother     Social History:  reports that he has never smoked. He has never used smokeless tobacco. He reports that he does not drink alcohol and does  not use drugs.  He lives in [place]. The patient is alone today.   Allergies:  Allergies  Allergen Reactions  . Mesalamine Nausea Only and Nausea And Vomiting    Current Medications: Current Outpatient Medications  Medication Sig Dispense Refill  . ACCU-CHEK AVIVA PLUS test strip 1 each by Other route as needed for other.     Marland Kitchen ACCU-CHEK SOFTCLIX LANCETS lancets USE 2 TIMES DAILY. USE AS INSTRUCTED.    Marland Kitchen aspirin EC 81 MG tablet Take 81 mg by mouth daily.     Marland Kitchen atorvastatin (LIPITOR) 40 MG tablet Take 40 mg by mouth 2 (two) times a day.     . carvedilol (COREG) 3.125 MG tablet Take 3.125 mg by mouth 2 (two) times daily with a meal.     . Cholecalciferol (D 5000) 125 MCG (5000 UT) capsule Take 5,000 Units by mouth daily.     . clopidogrel (PLAVIX) 75 MG tablet TAKE 1 TABLET BY MOUTH ONCE DAILY    . gabapentin (NEURONTIN) 300 MG capsule Take 300 mg by mouth 3 (three) times daily.    Marland Kitchen glipiZIDE (GLUCOTROL) 10 MG tablet Take 10 mg by mouth 2 (two) times daily.     Marland Kitchen levothyroxine (SYNTHROID) 75 MCG tablet Take 75 mcg by mouth daily before breakfast.    . Multiple Vitamins-Minerals (CENTRUM SILVER 50+MEN PO) Take by mouth.    . Omega-3 Fatty Acids (FISH OIL PO) Take 1,200 mg by mouth 2 (two) times daily.     Marland Kitchen omeprazole (PRILOSEC) 20 MG capsule TAKE 1 CAPSULE BY MOUTH  ONCE DAILY.  MAKE SURE TO  TAKE 12 HOURS APART FROM  PLAVIX.    Marland Kitchen prednisoLONE acetate (PRED FORTE) 1 % ophthalmic suspension     . Probiotic Product (PROBIOTIC DAILY PO) Take 1 capsule by mouth daily.     . vedolizumab (ENTYVIO) 300 MG injection Inject into the vein. Every 8 weeks 1 each 6  . pyridOXINE (VITAMIN B-6) 100 MG tablet Take 100 mg by mouth daily. (Patient not taking: No sig reported)    . vitamin B-12 (CYANOCOBALAMIN) 1000 MCG tablet Take 1,000 mcg by mouth daily. (Patient not taking: No sig reported)     No current facility-administered medications for this visit.    Review of Systems  Constitutional:  Negative for chills, diaphoresis, fever, malaise/fatigue and weight loss (up 1 lb).       Feels "not very well."  HENT: Negative for congestion, ear discharge, ear pain, hearing loss, nosebleeds, sinus pain, sore throat and tinnitus.        Lost 8 teeth  Eyes: Negative for blurred vision.       Neurotrophic keratitis, right eye  Respiratory: Negative.  Negative for cough, hemoptysis, sputum production, shortness of breath and stridor.   Cardiovascular: Negative.  Negative for chest pain, palpitations and leg swelling.  Gastrointestinal: Positive for diarrhea (rare). Negative for abdominal pain, blood in stool, constipation, heartburn, melena, nausea and vomiting.  Genitourinary: Negative.  Negative for dysuria, frequency, hematuria and urgency.  Musculoskeletal: Positive for back pain. Negative for falls, joint pain, myalgias and neck pain.  Skin: Negative.  Negative for itching and rash.  Neurological: Positive for sensory change (neuropathy in hands). Negative for dizziness, tingling, speech change, weakness and headaches.  Endo/Heme/Allergies: Negative.  Does not bruise/bleed easily.  Psychiatric/Behavioral: Negative.  Negative for depression and memory loss. The patient is not nervous/anxious and does not have insomnia.   All other systems reviewed and are negative.  Performance status (ECOG): 1  Vitals Blood pressure (!) 144/69, pulse 73, temperature (!) 96.5 F (35.8 C), resp. rate 20, weight 183 lb 10.3 oz (83.3 kg), SpO2 100 %.   Physical Exam Vitals and nursing note reviewed.  Constitutional:      General: He is not in acute distress.    Appearance: He is well-developed. He is not diaphoretic.  HENT:     Head: Normocephalic and atraumatic.     Comments: White hair.  Male pattern baldness.    Mouth/Throat:     Mouth: Mucous membranes are moist.     Pharynx: Oropharynx is clear. No oropharyngeal exudate or posterior oropharyngeal erythema.  Eyes:     General: No scleral  icterus.    Conjunctiva/sclera: Conjunctivae normal.     Pupils: Pupils are equal, round, and reactive to light.     Comments: Gold rimmed glasses. Blue eyes.  Neck:     Vascular: No JVD.  Cardiovascular:     Rate and Rhythm: Normal rate and regular rhythm.     Heart sounds: Normal heart sounds. No murmur heard. No friction rub. No gallop.   Pulmonary:     Effort: No respiratory distress.     Breath sounds: Normal breath sounds. No wheezing or rales.  Chest:     Chest wall: No tenderness.  Breasts:     Right: No axillary adenopathy or supraclavicular adenopathy.     Left: No axillary adenopathy or supraclavicular adenopathy.    Abdominal:     General: Bowel sounds are normal.  Musculoskeletal:        General: No tenderness.     Cervical back: Normal range of motion and neck supple.  Lymphadenopathy:     Head:     Right side of head: No preauricular, posterior auricular or occipital adenopathy.     Left side of head: No preauricular, posterior auricular or occipital adenopathy.     Cervical: No cervical adenopathy.     Upper Body:     Right upper body: No supraclavicular or axillary adenopathy.     Left upper body: No supraclavicular or axillary adenopathy.     Lower Body: No right inguinal adenopathy. No left inguinal adenopathy.  Skin:    General: Skin is warm and dry.     Coloration: Skin is not pale.     Findings: No erythema or rash.  Neurological:     Mental Status: He is alert and oriented to person, place, and time.  Psychiatric:        Thought Content: Thought content normal.        Judgment: Judgment normal.    No visits with results within 3 Day(s) from this visit.  Latest known visit with results is:  Office Visit on 01/01/2021  Component Date Value Ref Range Status  . WBC 01/01/2021 6.9  3.4 - 10.8 x10E3/uL Final  . RBC 01/01/2021 4.05* 4.14 - 5.80 x10E6/uL Final  . Hemoglobin 01/01/2021 12.9* 13.0 - 17.7 g/dL Final  . Hematocrit 01/01/2021 36.6* 37.5 -  51.0 % Final  . MCV 01/01/2021 90  79 - 97 fL Final  . MCH 01/01/2021 31.9  26.6 - 33.0 pg Final  . MCHC 01/01/2021 35.2  31.5 - 35.7 g/dL Final  . RDW 01/01/2021 13.1  11.6 - 15.4 % Final  . Platelets 01/01/2021 259  150 - 450 x10E3/uL Final  . Neutrophils 01/01/2021 58  Not Estab. % Final  . Lymphs 01/01/2021 30  Not Estab. % Final  . Monocytes 01/01/2021 10  Not Estab. % Final  . Eos 01/01/2021 2  Not Estab. % Final  . Basos 01/01/2021 0  Not Estab. % Final  . Neutrophils Absolute 01/01/2021 4.0  1.4 - 7.0 x10E3/uL Final  . Lymphocytes Absolute 01/01/2021 2.1  0.7 - 3.1 x10E3/uL Final  . Monocytes Absolute 01/01/2021 0.7  0.1 - 0.9 x10E3/uL Final  . EOS (ABSOLUTE) 01/01/2021 0.1  0.0 - 0.4 x10E3/uL Final  . Basophils Absolute 01/01/2021 0.0  0.0 - 0.2 x10E3/uL Final  . Immature Granulocytes 01/01/2021 0  Not Estab. % Final  . Immature Grans (Abs) 01/01/2021 0.0  0.0 - 0.1 x10E3/uL Final  . Vitamin D 1, 25 (OH)2 Total 01/01/2021 WILL FOLLOW   Preliminary  . Vitamin D2 1, 25 (OH)2 01/01/2021 WILL FOLLOW   Preliminary  . Vitamin D3 1, 25 (OH)2 01/01/2021 WILL FOLLOW   Preliminary    Assessment:  David [name] is a 75 y.o. 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  He has been treated with cyclosporine (09/2006), Remicade (09/2006 - 2009; 01/05/2017), steroids, and Entyvio.    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  He did not tolerate mesalamine.  He developed joint and muscle pain on Remicade.  The patient has received Entyvio on 06/08/2017, 06/25/2017, 07/23/2017, 09/29/2017, 12/07/2017, 02/01/2018, 03/29/2018, 05/24/2018, 07/19/2018, 09/13/2018, 11/08/2018, 01/03/2019, 02/28/2019, 05/05/2019, 07/07/2019, 09/15/2019, 03/01/2020, 05/01/2020, 07/02/2020, 09/02/2020, and 11/04/2020.  Sigmoidoscopy in 09/25/2016 revealed moderate to severe left-sided colitis.    Abdomen and pelvic CT on 09/28/2016 revealed diffuse  colonic wall thickening from the proximal transverse colon to the rectum.  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   The patient has coronary artery disease s/p LAD stent, mid RCA occlusion and left ventricular thrombus. Cardiac MRI on 08/25/2017 revealed moderately dilated left ventricle with normal wall thickness and severely decreased systolic function (LVEF = 39%). There was akinesis of the mid inferior, inferolateral, anterior, anteroseptal and apical inferior, anterior and septal walls.   Symptomatically, he has been "not very well." He has back pain due to degenerative disk disease. He has pain and numbness in his hands due to diabetic neuropathy.  He had an episode of diarrhea over the weekend. 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 He has lost 8 teeth since starting Entyvio.  Plan: 1.   Review labs from 01/01/2021 [facility]) and 11/12/2020 ([facility]). 2.   Moderate to severe ulcerative colitis             Clinically, all movements appear to be normal except for isolated episode of diarrhea this past weekend.  Discuss patient's thoughts about continuation of Entyvio.     He was initially unsure then decided to pursue treatment today.  Continue Entyvio every 2 months. 3.   Liver function tests  AST 24 and ALT 23.  Bilirubin 1.1.  Alkaline phosphatase 90 on 11/12/2020.  Continue to monitor Entyvio prior to treatments per GI. 4.   Entyvio today. 5.   RTC in 2 months and 4 months for Entyvio. 6.   RTC in 6 months for MD assessment and Entyvio.  I discussed the assessment and treatment plan with the patient.  The patient was provided an opportunity to ask questions and all were answered.  The patient agreed with the plan and demonstrated an understanding of the instructions.  The patient was advised  to call back if the symptoms worsen or if the condition fails to improve as anticipated.   I provided 15 minutes of face-to-face time during this this encounter and > 50% was spent counseling as documented under my assessment and plan.  An additional 10 minutes were spent reviewing his chart (Epic and Care Everywhere) including notes, labs, and imaging studies and discussing with Dr Vicente Males.    Lequita Asal, MD, PhD 01/06/2021, 6:48 PM   I, Mirian Mo Tufford, am acting as a Education administrator for [employer]. Mike Gip, MD.   I, Shylo Dillenbeck C. Mike Gip, MD, have reviewed the above documentation for accuracy and completeness, and I agree with the above.

## 2021-01-06 ENCOUNTER — Other Ambulatory Visit: Payer: Self-pay

## 2021-01-06 ENCOUNTER — Inpatient Hospital Stay: Payer: Medicare Other

## 2021-01-06 ENCOUNTER — Encounter: Payer: Self-pay | Admitting: Hematology and Oncology

## 2021-01-06 ENCOUNTER — Inpatient Hospital Stay: Payer: Medicare Other | Attending: Hematology and Oncology | Admitting: Hematology and Oncology

## 2021-01-06 VITALS — BP 144/69 | HR 73 | Temp 96.5°F | Resp 20 | Wt 183.6 lb

## 2021-01-06 DIAGNOSIS — Z79899 Other long term (current) drug therapy: Secondary | ICD-10-CM | POA: Diagnosis not present

## 2021-01-06 DIAGNOSIS — K51919 Ulcerative colitis, unspecified with unspecified complications: Secondary | ICD-10-CM | POA: Diagnosis not present

## 2021-01-06 DIAGNOSIS — Z8249 Family history of ischemic heart disease and other diseases of the circulatory system: Secondary | ICD-10-CM | POA: Diagnosis not present

## 2021-01-06 DIAGNOSIS — Z85820 Personal history of malignant melanoma of skin: Secondary | ICD-10-CM | POA: Insufficient documentation

## 2021-01-06 DIAGNOSIS — M85852 Other specified disorders of bone density and structure, left thigh: Secondary | ICD-10-CM | POA: Diagnosis not present

## 2021-01-06 DIAGNOSIS — Z8619 Personal history of other infectious and parasitic diseases: Secondary | ICD-10-CM | POA: Diagnosis not present

## 2021-01-06 DIAGNOSIS — I252 Old myocardial infarction: Secondary | ICD-10-CM | POA: Diagnosis not present

## 2021-01-06 DIAGNOSIS — M549 Dorsalgia, unspecified: Secondary | ICD-10-CM | POA: Diagnosis not present

## 2021-01-06 DIAGNOSIS — G629 Polyneuropathy, unspecified: Secondary | ICD-10-CM | POA: Insufficient documentation

## 2021-01-06 DIAGNOSIS — Z9049 Acquired absence of other specified parts of digestive tract: Secondary | ICD-10-CM | POA: Insufficient documentation

## 2021-01-06 DIAGNOSIS — K5100 Ulcerative (chronic) pancolitis without complications: Secondary | ICD-10-CM | POA: Diagnosis present

## 2021-01-06 DIAGNOSIS — I2510 Atherosclerotic heart disease of native coronary artery without angina pectoris: Secondary | ICD-10-CM | POA: Diagnosis not present

## 2021-01-06 DIAGNOSIS — B029 Zoster without complications: Secondary | ICD-10-CM | POA: Insufficient documentation

## 2021-01-06 DIAGNOSIS — M791 Myalgia, unspecified site: Secondary | ICD-10-CM | POA: Insufficient documentation

## 2021-01-06 MED ORDER — SODIUM CHLORIDE 0.9 % IV SOLN
Freq: Once | INTRAVENOUS | Status: AC
  Filled 2021-01-06: qty 250

## 2021-01-06 MED ORDER — VEDOLIZUMAB 300 MG IV SOLR
300.0000 mg | Freq: Once | INTRAVENOUS | Status: AC
  Administered 2021-01-06: 300 mg via INTRAVENOUS
  Filled 2021-01-06: qty 5

## 2021-01-06 NOTE — Progress Notes (Signed)
Pt received prescribed treatment in clinic, pt stable at d/c.

## 2021-01-06 NOTE — Progress Notes (Signed)
Patient states he is having really bad numbness, tingly, and burning in his hands. Patient also states he is having back pain.

## 2021-01-12 LAB — CBC WITH DIFFERENTIAL/PLATELET
Basophils Absolute: 0 10*3/uL (ref 0.0–0.2)
Basos: 0 %
EOS (ABSOLUTE): 0.1 10*3/uL (ref 0.0–0.4)
Eos: 2 %
Hematocrit: 36.6 % — ABNORMAL LOW (ref 37.5–51.0)
Hemoglobin: 12.9 g/dL — ABNORMAL LOW (ref 13.0–17.7)
Immature Grans (Abs): 0 10*3/uL (ref 0.0–0.1)
Immature Granulocytes: 0 %
Lymphocytes Absolute: 2.1 10*3/uL (ref 0.7–3.1)
Lymphs: 30 %
MCH: 31.9 pg (ref 26.6–33.0)
MCHC: 35.2 g/dL (ref 31.5–35.7)
MCV: 90 fL (ref 79–97)
Monocytes Absolute: 0.7 10*3/uL (ref 0.1–0.9)
Monocytes: 10 %
Neutrophils Absolute: 4 10*3/uL (ref 1.4–7.0)
Neutrophils: 58 %
Platelets: 259 10*3/uL (ref 150–450)
RBC: 4.05 x10E6/uL — ABNORMAL LOW (ref 4.14–5.80)
RDW: 13.1 % (ref 11.6–15.4)
WBC: 6.9 10*3/uL (ref 3.4–10.8)

## 2021-01-12 LAB — VITAMIN D 1,25 DIHYDROXY
Vitamin D 1, 25 (OH)2 Total: 36 pg/mL
Vitamin D2 1, 25 (OH)2: <10 pg/mL
Vitamin D3 1, 25 (OH)2: 35 pg/mL

## 2021-01-13 ENCOUNTER — Encounter: Payer: Self-pay | Admitting: Gastroenterology

## 2021-02-04 DIAGNOSIS — R208 Other disturbances of skin sensation: Secondary | ICD-10-CM | POA: Insufficient documentation

## 2021-02-04 DIAGNOSIS — W19XXXA Unspecified fall, initial encounter: Secondary | ICD-10-CM | POA: Insufficient documentation

## 2021-02-11 ENCOUNTER — Other Ambulatory Visit: Payer: Self-pay | Admitting: Neurology

## 2021-02-11 DIAGNOSIS — M79602 Pain in left arm: Secondary | ICD-10-CM

## 2021-02-11 DIAGNOSIS — R208 Other disturbances of skin sensation: Secondary | ICD-10-CM

## 2021-02-25 DIAGNOSIS — R29898 Other symptoms and signs involving the musculoskeletal system: Secondary | ICD-10-CM | POA: Insufficient documentation

## 2021-02-28 ENCOUNTER — Other Ambulatory Visit: Payer: Self-pay

## 2021-02-28 ENCOUNTER — Ambulatory Visit
Admission: RE | Admit: 2021-02-28 | Discharge: 2021-02-28 | Disposition: A | Discharge status: Home / Self Care | Payer: Medicare Other | Source: Ambulatory Visit | Attending: Neurology | Admitting: Neurology

## 2021-02-28 DIAGNOSIS — R208 Other disturbances of skin sensation: Secondary | ICD-10-CM | POA: Diagnosis present

## 2021-02-28 DIAGNOSIS — M79602 Pain in left arm: Secondary | ICD-10-CM | POA: Insufficient documentation

## 2021-03-05 DIAGNOSIS — Z8739 Personal history of other diseases of the musculoskeletal system and connective tissue: Secondary | ICD-10-CM | POA: Insufficient documentation

## 2021-03-05 DIAGNOSIS — M79602 Pain in left arm: Secondary | ICD-10-CM | POA: Insufficient documentation

## 2021-03-11 ENCOUNTER — Other Ambulatory Visit: Payer: Self-pay

## 2021-03-11 ENCOUNTER — Inpatient Hospital Stay: Payer: Medicare Other | Attending: Nurse Practitioner

## 2021-03-11 VITALS — BP 111/62 | HR 62 | Temp 96.9°F | Resp 18

## 2021-03-11 DIAGNOSIS — K5180 Other ulcerative colitis without complications: Secondary | ICD-10-CM | POA: Insufficient documentation

## 2021-03-11 DIAGNOSIS — K51919 Ulcerative colitis, unspecified with unspecified complications: Secondary | ICD-10-CM

## 2021-03-11 MED ORDER — SODIUM CHLORIDE 0.9 % IV SOLN
Freq: Once | INTRAVENOUS | Status: AC
  Filled 2021-03-11: qty 250

## 2021-03-11 MED ORDER — VEDOLIZUMAB 300 MG IV SOLR
300.0000 mg | Freq: Once | INTRAVENOUS | Status: AC
  Administered 2021-03-11: 300 mg via INTRAVENOUS
  Filled 2021-03-11: qty 5

## 2021-03-11 NOTE — Patient Instructions (Addendum)
Vedolizumab injection solution What is this medicine? VEDOLIZUMAB (Ve doe LIZ you mab) is used to treat ulcerative colitis and Crohn's disease in adult patients. This medicine may be used for other purposes; ask your health care provider or pharmacist if you have questions. COMMON BRAND NAME(S): Entyvio What should I tell my health care provider before I take this medicine? They need to know if you have any of these conditions:  diabetes  hepatitis B or history of hepatitis B infection  HIV or AIDS  immune system problems  infection or history of infections  liver disease  recently received or scheduled to receive a vaccine  scheduled to have surgery  tuberculosis, a positive skin test for tuberculosis or have recently been in close contact with someone who has tuberculosis   an unusual or allergic reaction to vedolizumab, other medicines, foods, dyes, or preservatives  pregnant or trying to get pregnant  breast-feeding How should I use this medicine? This medicine is for infusion into a vein. It is given by a health care professional in a hospital or clinic setting. A special MedGuide will be given to you by the pharmacist with each prescription and refill. Be sure to read this information carefully each time. Talk to your pediatrician regarding the use of this medicine in children. This medicine is not approved for use in children. Overdosage: If you think you have taken too much of this medicine contact a poison control center or emergency room at once. NOTE: This medicine is only for you. Do not share this medicine with others. What if I miss a dose? It is important not to miss your dose. Call your doctor or health care professional if you are unable to keep an appointment. What may interact with this medicine?  steroid medicines like prednisone or cortisone  TNF-alpha inhibitors like natalizumab, adalimumab, and infliximab  vaccines This list may not describe all  possible interactions. Give your health care provider a list of all the medicines, herbs, non-prescription drugs, or dietary supplements you use. Also tell them if you smoke, drink alcohol, or use illegal drugs. Some items may interact with your medicine. What should I watch for while using this medicine? Your condition will be monitored carefully while you are receiving this medicine. Visit your doctor for regular check ups. Tell your doctor or healthcare professional if your symptoms do not start to get better or if they get worse. Stay away from people who are sick. Call your doctor or health care professional for advice if you get a fever, chills or sore throat, or other symptoms of a cold or flu. Do not treat yourself. In some patients, this medicine may cause a serious brain infection that may cause death. If you have any problems seeing, thinking, speaking, walking, or standing, tell your doctor right away. If you cannot reach your doctor, get urgent medical care. What side effects may I notice from receiving this medicine? Side effects that you should report to your doctor or health care professional as soon as possible:  allergic reactions like skin rash, itching or hives, swelling of the face, lips, or tongue  breathing problems  changes in vision  chest pain  dark urine  depression, feelings of sadness  dizziness  general ill feeling or flu-like symptoms  irregular, missed, or painful menstrual periods  light-colored stools  loss of appetite, nausea  muscle weakness  problems with balance, talking, or walking  right upper belly pain  unusually weak or tired  yellowing  of the eyes or skin Side effects that usually do not require medical attention (report to your doctor or health care professional if they continue or are bothersome):  aches, pains  headache  stomach upset  tiredness This list may not describe all possible side effects. Call your doctor for  medical advice about side effects. You may report side effects to FDA at 1-800-FDA-1088. Where should I keep my medicine? This drug is given in a hospital or clinic and will not be stored at home. NOTE: This sheet is a summary. It may not cover all possible information. If you have questions about this medicine, talk to your doctor, pharmacist, or health care provider.  2021 Elsevier/Gold Standard (2015-10-31 08:36:51) [place]  Discharge Instructions: Thank you for choosing [facility] to provide your oncology and hematology care.  If you have a lab appointment with the [facility], please go directly to the [facility] and check in at the registration area.  Wear comfortable clothing and clothing appropriate for easy access to any Portacath or PICC line.   We strive to give you quality time with your provider. You may need to reschedule your appointment if you arrive late (15 or more minutes).  Arriving late affects you and other patients whose appointments are after yours.  Also, if you miss three or more appointments without notifying the office, you may be dismissed from the clinic at the provider's discretion.      For prescription refill requests, have your pharmacy contact our office and allow 72 hours for refills to be completed.    Today you received the following chemotherapy and/or immunotherapy agents Vedolizumab    To help prevent nausea and vomiting after your treatment, we encourage you to take your nausea medication as directed.  BELOW ARE SYMPTOMS THAT SHOULD BE REPORTED IMMEDIATELY: . *FEVER GREATER THAN 100.4 F (38 C) OR HIGHER . *CHILLS OR SWEATING . *NAUSEA AND VOMITING THAT IS NOT CONTROLLED WITH YOUR NAUSEA MEDICATION . *UNUSUAL SHORTNESS OF BREATH . *UNUSUAL BRUISING OR BLEEDING . *URINARY PROBLEMS (pain or burning when urinating, or frequent urination) . *BOWEL PROBLEMS (unusual diarrhea, constipation, pain near  the anus) . TENDERNESS IN MOUTH AND THROAT WITH OR WITHOUT PRESENCE OF ULCERS (sore throat, sores in mouth, or a toothache) . UNUSUAL RASH, SWELLING OR PAIN  . UNUSUAL VAGINAL DISCHARGE OR ITCHING   Items with * indicate a potential emergency and should be followed up as soon as possible or go to the Emergency Department if any problems should occur.  Please show the CHEMOTHERAPY ALERT CARD or IMMUNOTHERAPY ALERT CARD at check-in to the Emergency Department and triage nurse.  Should you have questions after your visit or need to cancel or reschedule your appointment, please contact [facility]  [phone] and follow the prompts.  Office hours are 8:00 a.m. to 4:30 p.m. Monday - Friday. Please note that voicemails left after 4:00 p.m. may not be returned until the following business day.  We are closed weekends and major holidays. You have access to a nurse at all times for urgent questions. Please call the main number to the clinic [phone] and follow the prompts.  For any non-urgent questions, you may also contact your provider using MyChart. We now offer e-Visits for anyone 82 and older to request care online for non-urgent symptoms. For details visit [email or web address].   Also download the MyChart app! Go to the app store, search "MyChart", open the app,  select [facility], and log in with your MyChart username and password.  Due to Covid, a mask is required upon entering the hospital/clinic. If you do not have a mask, one will be given to you upon arrival. For doctor visits, patients may have 1 support person aged 60 or older with them. For treatment visits, patients cannot have anyone with them due to current Covid guidelines and our immunocompromised population.

## 2021-03-11 NOTE — Progress Notes (Signed)
Patient received his Entyvio infusion. Tolerated well. Pt denies any concerns at time of discharge.

## 2021-05-06 ENCOUNTER — Other Ambulatory Visit: Payer: Self-pay

## 2021-05-06 ENCOUNTER — Inpatient Hospital Stay: Payer: Medicare Other | Attending: Nurse Practitioner

## 2021-05-06 VITALS — BP 114/72 | HR 68 | Temp 95.9°F | Resp 18

## 2021-05-06 DIAGNOSIS — K5180 Other ulcerative colitis without complications: Secondary | ICD-10-CM | POA: Insufficient documentation

## 2021-05-06 DIAGNOSIS — K51919 Ulcerative colitis, unspecified with unspecified complications: Secondary | ICD-10-CM

## 2021-05-06 MED ORDER — VEDOLIZUMAB 300 MG IV SOLR
300.0000 mg | Freq: Once | INTRAVENOUS | Status: AC
  Administered 2021-05-06: 300 mg via INTRAVENOUS
  Filled 2021-05-06: qty 5

## 2021-05-06 MED ORDER — SODIUM CHLORIDE 0.9 % IV SOLN
Freq: Once | INTRAVENOUS | Status: AC
  Filled 2021-05-06: qty 250

## 2021-05-06 NOTE — Patient Instructions (Signed)
[facility]  Discharge Instructions: Thank you for choosing [facility] to provide your oncology and hematology care.  If you have a lab appointment with the [facility], please go directly to the [facility] and check in at the registration area.  Wear comfortable clothing and clothing appropriate for easy access to any Portacath or PICC line.   We strive to give you quality time with your provider. You may need to reschedule your appointment if you arrive late (15 or more minutes).  Arriving late affects you and other patients whose appointments are after yours.  Also, if you miss three or more appointments without notifying the office, you may be dismissed from the clinic at the provider's discretion.      For prescription refill requests, have your pharmacy contact our office and allow 72 hours for refills to be completed.    Today you received the following chemotherapy and/or immunotherapy agents       To help prevent nausea and vomiting after your treatment, we encourage you to take your nausea medication as directed.  BELOW ARE SYMPTOMS THAT SHOULD BE REPORTED IMMEDIATELY: *FEVER GREATER THAN 100.4 F (38 C) OR HIGHER *CHILLS OR SWEATING *NAUSEA AND VOMITING THAT IS NOT CONTROLLED WITH YOUR NAUSEA MEDICATION *UNUSUAL SHORTNESS OF BREATH *UNUSUAL BRUISING OR BLEEDING *URINARY PROBLEMS (pain or burning when urinating, or frequent urination) *BOWEL PROBLEMS (unusual diarrhea, constipation, pain near the anus) TENDERNESS IN MOUTH AND THROAT WITH OR WITHOUT PRESENCE OF ULCERS (sore throat, sores in mouth, or a toothache) UNUSUAL RASH, SWELLING OR PAIN  UNUSUAL VAGINAL DISCHARGE OR ITCHING   Items with * indicate a potential emergency and should be followed up as soon as possible or go to the Emergency Department if any problems should occur.  Please show the CHEMOTHERAPY ALERT CARD or IMMUNOTHERAPY ALERT CARD at check-in to the Emergency  Department and triage nurse.  Should you have questions after your visit or need to cancel or reschedule your appointment, please contact [facility]  [phone] and follow the prompts.  Office hours are 8:00 a.m. to 4:30 p.m. Monday - Friday. Please note that voicemails left after 4:00 p.m. may not be returned until the following business day.  We are closed weekends and major holidays. You have access to a nurse at all times for urgent questions. Please call the main number to the clinic [phone] and follow the prompts.  For any non-urgent questions, you may also contact your provider using MyChart. We now offer e-Visits for anyone 9 and older to request care online for non-urgent symptoms. For details visit [email or web address].   Also download the MyChart app! Go to the app store, search "MyChart", open the app, select [facility], and log in with your MyChart username and password.  Due to Covid, a mask is required upon entering the hospital/clinic. If you do not have a mask, one will be given to you upon arrival. For doctor visits, patients may have 1 support person aged 63 or older with them. For treatment visits, patients cannot have anyone with them due to current Covid guidelines and our immunocompromised population.

## 2021-07-09 ENCOUNTER — Encounter: Payer: Self-pay | Admitting: Gastroenterology

## 2021-07-09 ENCOUNTER — Other Ambulatory Visit: Payer: Self-pay

## 2021-07-09 ENCOUNTER — Ambulatory Visit: Payer: Medicare Other | Admitting: Gastroenterology

## 2021-07-09 VITALS — BP 138/71 | HR 73 | Temp 97.3°F | Ht 72.0 in | Wt 179.8 lb

## 2021-07-09 DIAGNOSIS — K5900 Constipation, unspecified: Secondary | ICD-10-CM

## 2021-07-09 DIAGNOSIS — K5100 Ulcerative (chronic) pancolitis without complications: Secondary | ICD-10-CM | POA: Diagnosis not present

## 2021-07-09 DIAGNOSIS — Z0000 Encounter for general adult medical examination without abnormal findings: Secondary | ICD-10-CM

## 2021-07-09 DIAGNOSIS — Z2821 Immunization not carried out because of patient refusal: Secondary | ICD-10-CM

## 2021-07-09 DIAGNOSIS — Z1211 Encounter for screening for malignant neoplasm of colon: Secondary | ICD-10-CM

## 2021-07-09 NOTE — Progress Notes (Signed)
[name] MD, MRCP(U.K) [address]  [place]  [place], [place] [address]  Main: [phone]  Fax: [phone]   Primary Care Physician: Kirk Ruths, MD  Primary Gastroenterologist:  Dr. [name]   Chief complaint: Ulcerative colitis follow-up   HPI: David [name] is a 75 y.o. male Summary of history :   He has had ulcerative colitis from 2007 . 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 Remicaid d/c in 2009 due to the malignant skin lesions. Since 2009  He was being followed by [facility] till 2015 when his doctor retired. Hospitalized in 2011 for flare of the colitis. His symptoms returned in early December 2017  when I took over his care .  Sigmoidoscopy demonstrated severe left-sided colitis.   He was treated with oral steroids with resolution of rectal bleeding and discharged  4 days after discharge he returned to the hospital with severe colitis,readmitted 10/27/16 with ecoli sepsis/bacteriemia/fevers while on prednisone 40 mg .found to have pancolitis on CT scan.  Thrombosis of the inferior mesenteric vein.  We wanted to transfer him to a tertiary center but he refused . He was seen at [facility] Dr Daphane Shepherd who specializes in IBD for a second opinion as he has had prior skin cancer with regards to options. She saw him on 12/01/16.Commenced on remicaid 01/05/17. 3 days after the first infusion had no blood or diarrhea and firming up of stools.Commenced on Entyvio in 05/2017 due to severe reaction to Remicade.  In the past on multiple occasions  vaccination for hepatitis a and B pneumococcal vaccination has been recommended.  On multiple occasions I have discussed about colon cancer cancer screening, dysplasia surveillance with a colonoscopy and explained the very high risk of colon cancer in his case and strong recommendations to undergo the same  but he has refused every single time. He underwent a DEXA scan at [facility] clinic in February 2020 which shows osteopenia       Interval history 01/01/2021-07/09/2021  Last CBC was in March 2022 with a hemoglobin 12.9 g.  03/13/2021 hepatic function normal No recent C-reactive protein TB QuantiFERON is over a-year-old.  Has a bowel movement every 2 to 3 days.  Hard.  Denies any blood.  No urgency no cramping no rectal bleeding.  No other GI symptoms.     Current Outpatient Medications  Medication Sig Dispense Refill   ACCU-CHEK AVIVA PLUS test strip 1 each by Other route as needed for other.      ACCU-CHEK SOFTCLIX LANCETS lancets USE 2 TIMES DAILY. USE AS INSTRUCTED.     aspirin EC 81 MG tablet Take 81 mg by mouth daily.      atorvastatin (LIPITOR) 40 MG tablet Take 40 mg by mouth 2 (two) times a day.      carvedilol (COREG) 3.125 MG tablet Take 3.125 mg by mouth 2 (two) times daily with a meal.      Cholecalciferol (D 5000) 125 MCG (5000 UT) capsule Take 5,000 Units by mouth daily.      clopidogrel (PLAVIX) 75 MG tablet TAKE 1 TABLET BY MOUTH ONCE DAILY     gabapentin (NEURONTIN) 300 MG capsule Take 300 mg by mouth 3 (three) times daily.     glipiZIDE (GLUCOTROL) 10 MG tablet Take 10 mg by mouth 2 (two) times daily.  levothyroxine (SYNTHROID) 75 MCG tablet Take 75 mcg by mouth daily before breakfast.     Multiple Vitamins-Minerals (CENTRUM SILVER 50+MEN PO) Take by mouth.     Omega-3 Fatty Acids (FISH OIL PO) Take 1,200 mg by mouth 2 (two) times daily.      omeprazole (PRILOSEC) 20 MG capsule TAKE 1 CAPSULE BY MOUTH  ONCE DAILY.  MAKE SURE TO  TAKE 12 HOURS APART FROM  PLAVIX.     prednisoLONE acetate (PRED FORTE) 1 % ophthalmic suspension      Probiotic Product (PROBIOTIC DAILY PO) Take 1 capsule by mouth daily.      pyridOXINE (VITAMIN B-6) 100 MG tablet Take 100 mg by mouth daily.     traMADol (ULTRAM) 50 MG tablet 1 tablet in the morning and at bedtime.     vedolizumab  (ENTYVIO) 300 MG injection Inject into the vein. Every 8 weeks 1 each 6   vitamin B-12 (CYANOCOBALAMIN) 1000 MCG tablet Take 1,000 mcg by mouth daily.     amiodarone (PACERONE) 200 MG tablet Take 200 mg by mouth daily.     No current facility-administered medications for this visit.    Allergies as of 07/09/2021 - Review Complete 07/09/2021  Allergen Reaction Noted   Mesalamine Nausea Only and Nausea And Vomiting 12/01/2016    ROS:  General: Negative for anorexia, weight loss, fever, chills, fatigue, weakness. ENT: Negative for hoarseness, difficulty swallowing , nasal congestion. CV: Negative for chest pain, angina, palpitations, dyspnea on exertion, peripheral edema.  Respiratory: Negative for dyspnea at rest, dyspnea on exertion, cough, sputum, wheezing.  GI: See history of present illness. GU:  Negative for dysuria, hematuria, urinary incontinence, urinary frequency, nocturnal urination.  Endo: Negative for unusual weight change.    Physical Examination:   BP 138/71   Pulse 73   Temp (!) 97.3 F (36.3 C) (Oral)   Ht 6' (1.829 m)   Wt 179 lb 12.8 oz (81.6 kg)   BMI 24.39 kg/m   General: Well-nourished, well-developed in no acute distress.  Eyes: No icterus. Conjunctivae pink. Mouth: Oropharyngeal mucosa moist and pink , no lesions erythema or exudate. Extremities: No lower extremity edema. No clubbing or deformities. Neuro: Alert and oriented x 3.  Grossly intact. Skin: Warm and dry, no jaundice.   Psych: Alert and cooperative, normal mood and affect.   Imaging Studies: No results found.  Assessment and Plan:   David [name] is a 75 y.o. y/o male  here to follow up . He has a history of long standing ulcerative colitis- off treatment for many years and recent onset of symptoms in 2017 while off all therapy .Prior history of skin cancer (melanoma and he recalls basal cell ca) in the past Previously colectomy was discussed but he was not keen.  Commenced on  infliximab on 01/05/17 . Had severe reactions after the infusions and decided to change to [facility] in 05/2017 .  Clinically and biochemically in remission.He has been previously recommended annual skin exam , immunizations and a colonoscopy . Discussed extensively over prior visits, he is aware of the high risk of colon cancer from longstanding ulcerative colitis, chance of infection but not keen on any of these interventions..        Plan  1.  Check labs including CBC, CMP, CRP, TB QuantiFERON.  He wishes to get these done at his next PCP visit which is in 2 days time.  I have given him the lab requisition sheet to carry along with him.  I have explained to him that if the labs are not done before his infusion his infusion may be denied. 2.  Continue with Entyvio infusions 3.  Strongly stressed the need and importance of vaccination, colonoscopy for dysplasia surveillance, annual skin exam with a prior history of skin cancer.  He is aware of the risks of not doing so but is not keen on doing it at this point of time.  He has been explained very clearly that if he changes his mind he should come to my office to schedule the above.  Dr [name]  MD,MRCP [facility]) Follow up in 6 months

## 2021-07-10 ENCOUNTER — Ambulatory Visit: Payer: Medicare Other | Admitting: Oncology

## 2021-07-10 ENCOUNTER — Inpatient Hospital Stay: Payer: Medicare Other | Attending: Hematology and Oncology

## 2021-07-10 ENCOUNTER — Inpatient Hospital Stay (HOSPITAL_BASED_OUTPATIENT_CLINIC_OR_DEPARTMENT_OTHER): Payer: Medicare Other | Admitting: Nurse Practitioner

## 2021-07-10 ENCOUNTER — Inpatient Hospital Stay: Payer: Medicare Other

## 2021-07-10 ENCOUNTER — Ambulatory Visit: Payer: Medicare Other

## 2021-07-10 VITALS — BP 120/72 | HR 62 | Temp 96.0°F | Resp 18

## 2021-07-10 DIAGNOSIS — I2510 Atherosclerotic heart disease of native coronary artery without angina pectoris: Secondary | ICD-10-CM | POA: Insufficient documentation

## 2021-07-10 DIAGNOSIS — K51919 Ulcerative colitis, unspecified with unspecified complications: Secondary | ICD-10-CM

## 2021-07-10 DIAGNOSIS — Z8249 Family history of ischemic heart disease and other diseases of the circulatory system: Secondary | ICD-10-CM | POA: Diagnosis not present

## 2021-07-10 DIAGNOSIS — G720 Drug-induced myopathy: Secondary | ICD-10-CM | POA: Diagnosis not present

## 2021-07-10 DIAGNOSIS — Z79899 Other long term (current) drug therapy: Secondary | ICD-10-CM | POA: Diagnosis not present

## 2021-07-10 DIAGNOSIS — M2550 Pain in unspecified joint: Secondary | ICD-10-CM | POA: Diagnosis not present

## 2021-07-10 DIAGNOSIS — K5190 Ulcerative colitis, unspecified, without complications: Secondary | ICD-10-CM | POA: Insufficient documentation

## 2021-07-10 DIAGNOSIS — M7918 Myalgia, other site: Secondary | ICD-10-CM | POA: Insufficient documentation

## 2021-07-10 LAB — CBC WITH DIFFERENTIAL/PLATELET
Abs Immature Granulocytes: 0.01 10*3/uL (ref 0.00–0.07)
Basophils Absolute: 0 10*3/uL (ref 0.0–0.1)
Basophils Relative: 1 %
Eosinophils Absolute: 0.2 10*3/uL (ref 0.0–0.5)
Eosinophils Relative: 3 %
HCT: 35.6 % — ABNORMAL LOW (ref 39.0–52.0)
Hemoglobin: 13 g/dL (ref 13.0–17.0)
Immature Granulocytes: 0 %
Lymphocytes Relative: 28 %
Lymphs Abs: 1.8 10*3/uL (ref 0.7–4.0)
MCH: 31.7 pg (ref 26.0–34.0)
MCHC: 36.5 g/dL — ABNORMAL HIGH (ref 30.0–36.0)
MCV: 86.8 fL (ref 80.0–100.0)
Monocytes Absolute: 0.5 10*3/uL (ref 0.1–1.0)
Monocytes Relative: 8 %
Neutro Abs: 3.8 10*3/uL (ref 1.7–7.7)
Neutrophils Relative %: 60 %
Platelets: 302 10*3/uL (ref 150–400)
RBC: 4.1 MIL/uL — ABNORMAL LOW (ref 4.22–5.81)
RDW: 13.3 % (ref 11.5–15.5)
WBC: 6.3 10*3/uL (ref 4.0–10.5)
nRBC: 0 % (ref 0.0–0.2)

## 2021-07-10 LAB — COMPREHENSIVE METABOLIC PANEL
ALT: 26 U/L (ref 0–44)
AST: 32 U/L (ref 15–41)
Albumin: 4.4 g/dL (ref 3.5–5.0)
Alkaline Phosphatase: 82 U/L (ref 38–126)
Anion gap: 7 (ref 5–15)
BUN: 12 mg/dL (ref 8–23)
CO2: 26 mmol/L (ref 22–32)
Calcium: 9 mg/dL (ref 8.9–10.3)
Chloride: 101 mmol/L (ref 98–111)
Creatinine, Ser: 0.95 mg/dL (ref 0.61–1.24)
GFR, Estimated: >60 mL/min (ref >60)
Glucose, Bld: 120 mg/dL — ABNORMAL HIGH (ref 70–99)
Potassium: 4.3 mmol/L (ref 3.5–5.1)
Sodium: 134 mmol/L — ABNORMAL LOW (ref 135–145)
Total Bilirubin: 1 mg/dL (ref 0.3–1.2)
Total Protein: 7.8 g/dL (ref 6.5–8.1)

## 2021-07-10 MED ORDER — VEDOLIZUMAB 300 MG IV SOLR
300.0000 mg | Freq: Once | INTRAVENOUS | Status: AC
  Administered 2021-07-10: 300 mg via INTRAVENOUS
  Filled 2021-07-10: qty 5

## 2021-07-10 MED ORDER — SODIUM CHLORIDE 0.9 % IV SOLN
Freq: Once | INTRAVENOUS | Status: AC
Start: 2021-07-10 — End: 2021-07-10
  Filled 2021-07-10: qty 250

## 2021-07-10 NOTE — Progress Notes (Signed)
[facility]  [address], [address] [place], [place] [address] Phone: [phone]  Fax: [phone]  Virtual Visit Progress Note  I connected with David David [name] on 07/10/21 at  1:15 PM EDT by telephone visit and verified that I am speaking with the correct person using two identifiers.   I discussed the limitations, risks, security and privacy concerns of performing an evaluation and management service by telemedicine and the availability of in-person appointments. I also discussed with the patient that there may be a patient responsible charge related to this service. The patient expressed understanding and agreed to proceed.   Other persons participating in the visit and their role in the encounter: none   Patient's location: home  Provider's location: clinic   Chief Complaint: Ulcerative Colitis  Clinic Day: 07/10/21  Referring physician: Kirk Ruths, MD  Chief Complaint: David David [name] is a 75 y.o. 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    HPI: David David [name] is a 75 y.o. 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  He has been treated with cyclosporine (09/2006), Remicade (09/2006 - 2009; 01/05/2017), steroids, and Entyvio.     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  He did not tolerate mesalamine.  He developed joint and muscle pain on Remicade.   The patient has received Entyvio on 06/08/2017, 06/25/2017, 07/23/2017, 09/29/2017, 12/07/2017, 02/01/2018, 03/29/2018, 05/24/2018, 07/19/2018, 09/13/2018, 11/08/2018, 01/03/2019, 02/28/2019, 05/05/2019, 07/07/2019, 09/15/2019, 03/01/2020, 05/01/2020, 07/02/2020, 09/02/2020, and 11/04/2020.   Sigmoidoscopy in 09/25/2016 revealed moderate to severe David [name]-sided colitis.     Abdomen and pelvic CT on 09/28/2016 revealed diffuse colonic wall thickening from the  proximal transverse colon to the rectum.  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    The patient has coronary artery disease s/p LAD stent, mid RCA occlusion and David [name] ventricular thrombus. Cardiac MRI on 08/25/2017 revealed moderately dilated David [name] ventricle with normal wall thickness and severely decreased systolic function (LVEF = 39%). There was akinesis of the mid inferior, inferolateral, anterior, anteroseptal and apical inferior, anterior and septal walls.   Interval History: Patient returns to clinic for follow-up and consideration of Entyvio.He saw Dr. Vicente David [name] with GI yesterday. Feels well. UC symptoms are well controlled. No diarrhea. Tolerating entyvio well without significant side effects. He requests labs that were ordered by Dr. Vicente David [name] to be drawn today.    Past Medical History:  Diagnosis Date   Anemia    CAD (coronary artery disease) 07/08/2011   Overview:  a. 07/2006: Anterior ST elevation MI. b. 07/2006: PCI with BMS to LAD and RCA. c. 09/2006: Non ST elevation. d. LVEF 30%.  Discussed ICD, not currently interested.    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   Type II diabetes mellitus, uncontrolled ([facility]) 07/08/2011   Vitamin D deficiency     Past Surgical History:  Procedure Laterality Date   CARDIAC CATHETERIZATION     CHOLECYSTECTOMY     COLONOSCOPY  07/20/2006   Crohn's  disease   CORONARY ANGIOGRAPHY N/A 07/07/2017   Procedure: CORONARY ANGIOGRAPHY;  Surgeon: David Spray, MD;   Location: [facility];  Service: Cardiovascular;  Laterality: N/A;   CORONARY STENT PLACEMENT     FLEXIBLE SIGMOIDOSCOPY N/A 09/25/2016   Procedure: FLEXIBLE SIGMOIDOSCOPY;  Surgeon: David Bellows, MD;  Location: [facility] ENDOSCOPY;  Service: Endoscopy;  Laterality: N/A;   David [name] HEART CATH David [name] 07/07/2017   Procedure: David [name] Heart Cath;  Surgeon: David Spray, MD;  Location: [facility];  Service: Cardiovascular;  Laterality: David [name];   MELANOMA REMOVED     parotid gland removal      Family History  Problem Relation Age of Onset   Heart disease Mother    Heart attack Father    Heart disease Sister    Heart disease Brother     Social History:  reports that he has never smoked. He has never used smokeless tobacco. He reports that he does not drink alcohol and does not use drugs.  He lives in [place]. The patient is alone today.   Allergies:  Allergies  Allergen Reactions   Mesalamine Nausea Only and Nausea And Vomiting    Current Medications: Current Outpatient Medications  Medication Sig Dispense Refill   ACCU-CHEK AVIVA PLUS test strip 1 each by Other route as needed for other.      ACCU-CHEK SOFTCLIX LANCETS lancets USE 2 TIMES DAILY. USE AS INSTRUCTED.     amiodarone (PACERONE) 200 MG tablet Take 200 mg by mouth daily.     aspirin EC 81 MG tablet Take 81 mg by mouth daily.      atorvastatin (LIPITOR) 40 MG tablet Take 40 mg by mouth 2 (two) times a day.      carvedilol (COREG) 3.125 MG tablet Take 3.125 mg by mouth 2 (two) times daily with a meal.      Cholecalciferol (D 5000) 125 MCG (5000 UT) capsule Take 5,000 Units by mouth daily.      clopidogrel (PLAVIX) 75 MG tablet TAKE 1 TABLET BY MOUTH ONCE DAILY     gabapentin (NEURONTIN) 300 MG capsule Take 300 mg by mouth 3 (three) times daily.     glipiZIDE (GLUCOTROL) 10 MG tablet Take 10 mg by mouth 2 (two) times daily.      levothyroxine (SYNTHROID) 75 MCG tablet Take 75 mcg by mouth daily before breakfast.      Multiple Vitamins-Minerals (CENTRUM SILVER 50+MEN PO) Take by mouth.     Omega-3 Fatty Acids (FISH OIL PO) Take 1,200 mg by mouth 2 (two) times daily.      omeprazole (PRILOSEC) 20 MG capsule TAKE 1 CAPSULE BY MOUTH  ONCE DAILY.  MAKE SURE TO  TAKE 12 HOURS APART FROM  PLAVIX.     prednisoLONE acetate (PRED FORTE) 1 % ophthalmic suspension      Probiotic Product (PROBIOTIC DAILY PO) Take 1 capsule by mouth daily.      pyridOXINE (VITAMIN B-6) 100 MG tablet Take 100 mg by mouth daily.     traMADol (ULTRAM) 50 MG tablet 1 tablet in the morning and at bedtime.     vedolizumab (ENTYVIO) 300 MG injection Inject into the vein. Every 8 weeks 1 each 6   vitamin B-12 (CYANOCOBALAMIN) 1000 MCG tablet Take 1,000 mcg by mouth daily.     No current facility-administered medications for this visit.    Review of Systems  Constitutional:  Negative for chills, fever, malaise/fatigue and weight loss.  HENT:  Negative for hearing  loss, nosebleeds, sore throat and tinnitus.   Eyes:  Negative for blurred vision and double vision.  Respiratory:  Negative for cough, hemoptysis, shortness of breath and wheezing.   Cardiovascular:  Negative for chest pain, palpitations and leg swelling.  Gastrointestinal:  Negative for abdominal pain, blood in stool, constipation, diarrhea, melena, nausea and vomiting.  Genitourinary:  Negative for dysuria and urgency.  Musculoskeletal:  Negative for back pain, falls, joint pain and myalgias.  Skin:  Negative for itching and rash.  Neurological:  Negative for dizziness, tingling, sensory change, loss of consciousness, weakness and headaches.  Endo/Heme/Allergies:  Negative for environmental allergies. Does not bruise/bleed easily.  Psychiatric/Behavioral:  Negative for depression. The patient is not nervous/anxious and does not have insomnia.   Performance status (ECOG): 1  Vitals There were no vitals taken for this visit.   Physical Exam Vitals reviewed: Exam limited d/t  virtual visit.   No visits with results within 3 Day(s) from this visit.  Latest known visit with results is:  Office Visit on 01/01/2021  Component Date Value Ref Range Status   WBC 01/01/2021 6.9  3.4 - 10.8 x10E3/uL Final   RBC 01/01/2021 4.05 (A) 4.14 - 5.80 x10E6/uL Final   Hemoglobin 01/01/2021 12.9 (A) 13.0 - 17.7 g/dL Final   Hematocrit 01/01/2021 36.6 (A) 37.5 - 51.0 % Final   MCV 01/01/2021 90  79 - 97 fL Final   MCH 01/01/2021 31.9  26.6 - 33.0 pg Final   MCHC 01/01/2021 35.2  31.5 - 35.7 g/dL Final   RDW 01/01/2021 13.1  11.6 - 15.4 % Final   Platelets 01/01/2021 259  150 - 450 x10E3/uL Final   Neutrophils 01/01/2021 58  Not Estab. % Final   Lymphs 01/01/2021 30  Not Estab. % Final   Monocytes 01/01/2021 10  Not Estab. % Final   Eos 01/01/2021 2  Not Estab. % Final   Basos 01/01/2021 0  Not Estab. % Final   Neutrophils Absolute 01/01/2021 4.0  1.4 - 7.0 x10E3/uL Final   Lymphocytes Absolute 01/01/2021 2.1  0.7 - 3.1 x10E3/uL Final   Monocytes Absolute 01/01/2021 0.7  0.1 - 0.9 x10E3/uL Final   EOS (ABSOLUTE) 01/01/2021 0.1  0.0 - 0.4 x10E3/uL Final   Basophils Absolute 01/01/2021 0.0  0.0 - 0.2 x10E3/uL Final   Immature Granulocytes 01/01/2021 0  Not Estab. % Final   Immature Grans (Abs) 01/01/2021 0.0  0.0 - 0.1 x10E3/uL Final   Vitamin D 1, 25 (OH)2 Total 01/01/2021 36  pg/mL Final   Comment: Reference Range: Adults: 21 - 65    Vitamin D2 1, 25 (OH)2 01/01/2021 <10  pg/mL Final   Comment: This test was developed and its performance characteristics determined by [facility]. It has not been cleared or approved by the Food and Drug Administration.    Vitamin D3 1, 25 (OH)2 01/01/2021 35  pg/mL Final   Comment: This test was developed and its performance characteristics determined by [facility]. It has not been cleared or approved by the Food and Drug Administration.     Assessment & Plan:  1.   Moderate to severe ulcerative colitis- currently on entyvio. Symptoms  well controlled. Managed by GI. He will have labs today and if acceptable, proceed with entyvio today.  2. LFTs- labs today to evaluate. Monitor. Labs every 6 months with lfts.   Plan:  Add on labs today ([place])- labs ordered by Dr. Elon Alas injection today (in [place]) RTC in 2 months for entyvio RTC in  4 months for Entyvio Rtc in 6 months for labs, see MD to establish care, entivyo-  I discussed the assessment and treatment plan with the patient. The patient was provided an opportunity to ask questions and all were answered. The patient agreed with the plan and demonstrated an understanding of the instructions.   The patient was advised to call back or seek an in-person evaluation if the symptoms worsen or if the condition fails to improve as anticipated.   I spent 12 minutes non face-to-face telephone visit time dedicated to the care of this patient on the date of this encounter to include pre-visit review of Gi notes, hematology notes, labs, time with the patient, and post visit ordering of testing/documentation.    Beckey Rutter, DNP, AGNP-C [facility]

## 2021-07-10 NOTE — Addendum Note (Signed)
Addended by: Verlon Au on: 07/10/2021 02:28 PM   Modules accepted: Orders

## 2021-07-10 NOTE — Patient Instructions (Signed)
[facility]  Discharge Instructions: Thank you for choosing [facility] to provide your oncology and hematology care.  If you have a lab appointment with the [facility], please go directly to the [facility] and check in at the registration area.  Wear comfortable clothing and clothing appropriate for easy access to any Portacath or PICC line.   We strive to give you quality time with your provider. You may need to reschedule your appointment if you arrive late (15 or more minutes).  Arriving late affects you and other patients whose appointments are after yours.  Also, if you miss three or more appointments without notifying the office, you may be dismissed from the clinic at the provider's discretion.      For prescription refill requests, have your pharmacy contact our office and allow 72 hours for refills to be completed.    Today you received the following chemotherapy and/or immunotherapy agents       To help prevent nausea and vomiting after your treatment, we encourage you to take your nausea medication as directed.  BELOW ARE SYMPTOMS THAT SHOULD BE REPORTED IMMEDIATELY: *FEVER GREATER THAN 100.4 F (38 C) OR HIGHER *CHILLS OR SWEATING *NAUSEA AND VOMITING THAT IS NOT CONTROLLED WITH YOUR NAUSEA MEDICATION *UNUSUAL SHORTNESS OF BREATH *UNUSUAL BRUISING OR BLEEDING *URINARY PROBLEMS (pain or burning when urinating, or frequent urination) *BOWEL PROBLEMS (unusual diarrhea, constipation, pain near the anus) TENDERNESS IN MOUTH AND THROAT WITH OR WITHOUT PRESENCE OF ULCERS (sore throat, sores in mouth, or a toothache) UNUSUAL RASH, SWELLING OR PAIN  UNUSUAL VAGINAL DISCHARGE OR ITCHING   Items with * indicate a potential emergency and should be followed up as soon as possible or go to the Emergency Department if any problems should occur.  Please show the CHEMOTHERAPY ALERT CARD or IMMUNOTHERAPY ALERT CARD at check-in to the Emergency  Department and triage nurse.  Should you have questions after your visit or need to cancel or reschedule your appointment, please contact [facility]  [phone] and follow the prompts.  Office hours are 8:00 a.m. to 4:30 p.m. Monday - Friday. Please note that voicemails left after 4:00 p.m. may not be returned until the following business day.  We are closed weekends and major holidays. You have access to a nurse at all times for urgent questions. Please call the main number to the clinic [phone] and follow the prompts.  For any non-urgent questions, you may also contact your provider using MyChart. We now offer e-Visits for anyone 29 and older to request care online for non-urgent symptoms. For details visit [email or web address].   Also download the MyChart app! Go to the app store, search "MyChart", open the app, select [facility], and log in with your MyChart username and password.  Due to Covid, a mask is required upon entering the hospital/clinic. If you do not have a mask, one will be given to you upon arrival. For doctor visits, patients may have 1 support person aged 29 or older with them. For treatment visits, patients cannot have anyone with them due to current Covid guidelines and our immunocompromised population.

## 2021-07-11 ENCOUNTER — Ambulatory Visit: Payer: Medicare Other

## 2021-07-11 ENCOUNTER — Ambulatory Visit: Payer: Medicare Other | Admitting: Nurse Practitioner

## 2021-07-11 LAB — C-REACTIVE PROTEIN: CRP: 1.3 mg/dL — ABNORMAL HIGH (ref <1.0)

## 2021-07-13 LAB — QUANTIFERON-TB GOLD PLUS: QuantiFERON-TB Gold Plus: NEGATIVE

## 2021-07-13 LAB — QUANTIFERON-TB GOLD PLUS (RQFGPL)
QuantiFERON Mitogen Value: >10 IU/mL
QuantiFERON Nil Value: 0.01 IU/mL
QuantiFERON TB1 Ag Value: 0.01 IU/mL
QuantiFERON TB2 Ag Value: 0.01 IU/mL

## 2021-07-29 ENCOUNTER — Ambulatory Visit
Admission: EM | Admit: 2021-07-29 | Discharge: 2021-07-29 | Disposition: A | Discharge status: Home / Self Care | Payer: Medicare Other | Attending: Family | Admitting: Family

## 2021-07-29 ENCOUNTER — Encounter: Payer: Self-pay | Admitting: Emergency Medicine

## 2021-07-29 ENCOUNTER — Other Ambulatory Visit: Payer: Self-pay

## 2021-07-29 DIAGNOSIS — J029 Acute pharyngitis, unspecified: Secondary | ICD-10-CM | POA: Diagnosis not present

## 2021-07-29 DIAGNOSIS — R051 Acute cough: Secondary | ICD-10-CM | POA: Diagnosis not present

## 2021-07-29 DIAGNOSIS — U071 COVID-19: Secondary | ICD-10-CM | POA: Insufficient documentation

## 2021-07-29 LAB — RESP PANEL BY RT-PCR (FLU A&B, COVID) ARPGX2
Influenza A by PCR: NEGATIVE
Influenza B by PCR: NEGATIVE
SARS Coronavirus 2 by RT PCR: POSITIVE — AB

## 2021-07-29 MED ORDER — BENZONATATE 200 MG PO CAPS: 200.0000 mg | ORAL_CAPSULE | Freq: Three times a day (TID) | ORAL | 0 refills | Status: DC | PRN

## 2021-07-29 MED ORDER — MOLNUPIRAVIR 200 MG PO CAPS: 4.0000 | ORAL_CAPSULE | Freq: Two times a day (BID) | ORAL | 0 refills | Status: AC

## 2021-07-29 NOTE — ED Triage Notes (Addendum)
Pt presents today with c/o sore throat and cough x 3 days. Denies fever. He reports getting Flu vaccine 6 days ago.

## 2021-07-29 NOTE — Discharge Instructions (Addendum)
Recommend start Molnupiravir 826m (4 capsules) twice a day for 5 days. May take Tessalon cough pills 1 every 8 hours as needed for cough. May take Tylenol 10028mevery 8 hours as needed for pain. If breathing gets more difficult, fever, or worsening of cough occurs, go to the ER ASAP. Otherwise, follow-up in 3 to 4 days with your PCP if minimal improvement.

## 2021-07-29 NOTE — ED Provider Notes (Signed)
[facility]    CSN: [id number] Arrival date & time: 07/29/21  0901      History   Chief Complaint Chief Complaint  Patient presents with   Sore Throat   Cough    HPI David [name] is a 75 y.o. male.   75 year old male presents with sore throat and occasional productive cough for the past 3 days. Also having slight runny nose but no fever. No new nausea or diarrhea. Was given the Influenza vaccine 6 days ago and now sick. Had first COVID 19 injection and developed Shingles within a week. No subsequent injections or boosters. Recently had the Pneumovax vaccine and then also developed a rash/side effects. No other family members ill. Other chronic health issues include CAD, history of MI, HTN, hyperlipidemia, hypothyroidism, GERD, ulcerative colitis, arthritis, and type 2 DM. Currently on Coreg, Lipitor, Amiodarone, aspirin, Plavix, Gabapentin, Glipizide, Synthroid, Prilosec, and Tramadol daily and Entyvio every 2 months.   The history is provided by the patient.   Past Medical History:  Diagnosis Date   Anemia    CAD (coronary artery disease) 07/08/2011   Overview:  a. 07/2006: Anterior ST elevation MI. b. 07/2006: PCI with BMS to LAD and RCA. c. 09/2006: Non ST elevation. d. LVEF 30%.  Discussed ICD, not currently interested.    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 ([place]) 02/26/6159   Dyslipidemia 07/30/2011   Overview:  High triglycerides   GERD (gastroesophageal reflux disease) 07/08/2011   History of Clostridium difficile colitis    History of myocardial infarction    Hyperlipidemia, unspecified 07/08/2011   Hypothyroidism    Hypothyroidism due to acquired atrophy of thyroid 02/15/2015   Type II diabetes mellitus, uncontrolled 07/08/2011   Vitamin D deficiency     Patient Active Problem List   Diagnosis Date Noted   Arm pain, left 03/05/2021   Hx of low back pain 03/05/2021   Left hand weakness 02/25/2021   Falls, initial encounter 02/04/2021    Burning sensation 02/04/2021   Post herpetic neuralgia 01/02/2020   Lumbar spondylosis 09/20/2018   Primary osteoarthritis of hand 73/71/0626   Chronic systolic CHF (congestive heart failure) ([place]) 03/25/2018   Idiopathic peripheral neuropathy 08/27/2017   Ischemic cardiomyopathy 07/13/2017   Hypertension, essential 06/07/2017   SOB (shortness of breath) 06/07/2017   DNR (do not resuscitate) discussion    Palliative care by specialist    Sepsis ([place]) 10/27/2016   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[facility])    Fever    Elevated lactic acid level    Left sided ulcerative colitis with rectal bleeding ([place]) 10/06/2016   Protein-calorie malnutrition, severe 09/29/2016   Ulcerative colitis with complication ([place])    Anal or rectal pain    Idiopathic chronic inflammatory bowel disease    Moderate chronic ulcerative colitis with rectal bleeding ([place])    Hypothyroidism due to acquired atrophy of thyroid 02/15/2015   Dyslipidemia 07/30/2011   CAD (coronary artery disease) 07/08/2011   GERD (gastroesophageal reflux disease) 07/08/2011   Hyperlipidemia, unspecified 07/08/2011   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   Type II diabetes mellitus, uncontrolled 07/08/2011   LV (left ventricular) mural thrombus following MI ([place]) 07/08/2011   Controlled type 2 diabetes mellitus with complication, without long-term current use of insulin ([place]) 07/08/2011    Past Surgical History:  Procedure Laterality Date   CARDIAC CATHETERIZATION     CHOLECYSTECTOMY     COLONOSCOPY  07/20/2006   Crohn's disease   CORONARY ANGIOGRAPHY N/A 07/07/2017  Procedure: CORONARY ANGIOGRAPHY;  Surgeon: Teodoro Spray, MD;  Location: [facility];  Service: Cardiovascular;  Laterality: N/A;   CORONARY STENT PLACEMENT     FLEXIBLE SIGMOIDOSCOPY N/A 09/25/2016   Procedure: FLEXIBLE SIGMOIDOSCOPY;  Surgeon: [name], MD;  Location: [facility];  Service: Endoscopy;  Laterality: N/A;   LEFT HEART CATH  Left 07/07/2017   Procedure: Left Heart Cath;  Surgeon: Teodoro Spray, MD;  Location: [facility];  Service: Cardiovascular;  Laterality: Left;   MELANOMA REMOVED     parotid gland removal         Home Medications    Prior to Admission medications   Medication Sig Start Date End Date Taking? Authorizing Provider  benzonatate (TESSALON) 200 MG capsule Take 1 capsule (200 mg total) by mouth 3 (three) times daily as needed for cough. 07/29/21  Yes Cyd Hostler, Nicholes Stairs, NP  molnupiravir EUA (LAGEVRIO) 200 MG CAPS capsule Take 4 capsules (800 mg total) by mouth 2 (two) times daily for 5 days. 07/29/21 08/03/21 Yes Toria Monte, Nicholes Stairs, NP  ACCU-CHEK AVIVA PLUS test strip 1 each by Other route as needed for other.  09/01/16   [provider]  ACCU-CHEK SOFTCLIX LANCETS lancets USE 2 TIMES DAILY. USE AS INSTRUCTED. 11/30/16   [provider]  amiodarone (PACERONE) 200 MG tablet Take 200 mg by mouth daily. 03/05/21   [provider]  aspirin EC 81 MG tablet Take 81 mg by mouth daily.     [provider]  atorvastatin (LIPITOR) 40 MG tablet Take 40 mg by mouth 2 (two) times a day.  05/21/16   [provider]  carvedilol (COREG) 3.125 MG tablet Take 3.125 mg by mouth 2 (two) times daily with a meal.  05/21/16   [provider]  Cholecalciferol (D 5000) 125 MCG (5000 UT) capsule Take 5,000 Units by mouth daily.     [provider]  clopidogrel (PLAVIX) 75 MG tablet TAKE 1 TABLET BY MOUTH ONCE DAILY 01/13/19   [provider]  gabapentin (NEURONTIN) 300 MG capsule Take 300 mg by mouth 3 (three) times daily.    [provider]  glipiZIDE (GLUCOTROL) 10 MG tablet Take 10 mg by mouth 2 (two) times daily.  05/21/16   [provider]  levothyroxine (SYNTHROID) 75 MCG tablet Take 75 mcg by mouth daily before breakfast.    [provider]  Multiple Vitamins-Minerals (CENTRUM SILVER 50+MEN PO) Take by mouth.     [provider]  Omega-3 Fatty Acids (FISH OIL PO) Take 1,200 mg by mouth 2 (two) times daily.  06/25/08   [provider]  omeprazole (PRILOSEC) 20 MG capsule TAKE 1 CAPSULE BY MOUTH  ONCE DAILY.  MAKE SURE TO  TAKE 12 HOURS APART FROM  PLAVIX. 07/17/19   [provider]  prednisoLONE acetate (PRED FORTE) 1 % ophthalmic suspension  09/13/20   [provider]  Probiotic Product (PROBIOTIC DAILY PO) Take 1 capsule by mouth daily.     [provider]  pyridOXINE (VITAMIN B-6) 100 MG tablet Take 100 mg by mouth daily.    [provider]  traMADol (ULTRAM) 50 MG tablet 1 tablet in the morning and at bedtime. 04/30/21   [provider]  vedolizumab (ENTYVIO) 300 MG injection Inject into the vein. Every 8 weeks 07/24/20   [name], MD  vitamin B-12 (CYANOCOBALAMIN) 1000 MCG tablet Take 1,000 mcg by mouth daily.    [provider]  Family History Family History  Problem Relation Age of Onset   Heart disease Mother    Heart attack Father    Heart disease Sister    Heart disease Brother     Social History Social History   Tobacco Use   Smoking status: Never   Smokeless tobacco: Never  Vaping Use   Vaping Use: Never used  Substance Use Topics   Alcohol use: No   Drug use: No     Allergies   Mesalamine   Review of Systems Review of Systems  Constitutional:  Positive for activity change, appetite change and fatigue. Negative for diaphoresis and fever.  HENT:  Positive for congestion, postnasal drip, rhinorrhea and sore throat. Negative for ear discharge, ear pain, facial swelling, mouth sores, nosebleeds, sinus pressure and trouble swallowing.   Eyes:  Negative for discharge and itching.  Respiratory:  Positive for cough. Negative for chest tightness, shortness of breath and wheezing.   Cardiovascular:  Negative for chest pain.  Gastrointestinal:  Negative for nausea and vomiting.  Musculoskeletal:  Positive  for arthralgias and myalgias. Negative for neck pain and neck stiffness.  Skin:  Negative for color change and rash.  Allergic/Immunologic: Positive for immunocompromised state. Negative for environmental allergies and food allergies.  Neurological:  Negative for dizziness, tremors, seizures, syncope, facial asymmetry and speech difficulty.  Hematological:  Negative for adenopathy. Bruises/bleeds easily.    Physical Exam Triage Vital Signs ED Triage Vitals  Enc Vitals Group     BP 07/29/21 0956 127/63     Pulse Rate 07/29/21 0956 78     Resp 07/29/21 0956 18     Temp 07/29/21 0956 98.7 F (37.1 C)     Temp Source 07/29/21 0956 Oral     SpO2 07/29/21 0956 98 %     Weight --      Height --      Head Circumference --      Peak Flow --      Pain Score 07/29/21 0953 8     Pain Loc --      Pain Edu? --      Excl. in [place]? --    No data found.  Updated Vital Signs BP 127/63 (BP Location: Left Arm)   Pulse 78   Temp 98.7 F (37.1 C) (Oral)   Resp 18   SpO2 98%   Visual Acuity Right Eye Distance:   Left Eye Distance:   Bilateral Distance:    Right Eye Near:   Left Eye Near:    Bilateral Near:     Physical Exam Vitals and nursing note reviewed.  Constitutional:      General: He is awake.     Appearance: He is well-developed and well-groomed. He is ill-appearing.     Comments: He is sitting in the exam chair in no acute distress but appears tired and ill.   HENT:     Head: Normocephalic and atraumatic.     Right Ear: Hearing, tympanic membrane, ear canal and external ear normal.     Left Ear: Hearing, tympanic membrane, ear canal and external ear normal.     Nose: Congestion present.     Right Sinus: No maxillary sinus tenderness or frontal sinus tenderness.     Left Sinus: No maxillary sinus tenderness or frontal sinus tenderness.     Mouth/Throat:     Lips: Pink.     Mouth: Mucous membranes are moist.     Pharynx: Uvula midline. Posterior oropharyngeal erythema  present. No pharyngeal swelling, oropharyngeal exudate or uvula swelling.  Eyes:     Extraocular Movements: Extraocular movements intact.     Conjunctiva/sclera: Conjunctivae normal.  Cardiovascular:     Rate and Rhythm: Normal rate and regular rhythm.     Heart sounds: Murmur heard.  Pulmonary:     Effort: Pulmonary effort is normal. No respiratory distress.     Breath sounds: Normal air entry. No decreased air movement. Examination of the right-upper field reveals decreased breath sounds and rhonchi. Examination of the left-upper field reveals decreased breath sounds and rhonchi. Decreased breath sounds and rhonchi present. No wheezing or rales.     Comments: Coarse breath sounds when coughing.  Musculoskeletal:     Cervical back: Normal range of motion and neck supple.  Skin:    General: Skin is warm and dry.     Capillary Refill: Capillary refill takes less than 2 seconds.     Findings: No rash.  Neurological:     General: No focal deficit present.     Mental Status: He is alert and oriented to person, place, and time.  Psychiatric:        Mood and Affect: Mood normal.        Behavior: Behavior normal. Behavior is cooperative.        Thought Content: Thought content normal.        Judgment: Judgment normal.     UC Treatments / Results  Labs (all labs ordered are listed, but only abnormal results are displayed) Labs Reviewed  RESP PANEL BY RT-PCR (FLU A&B, COVID) ARPGX2 - Abnormal; Notable for the following components:      Result Value   SARS Coronavirus 2 by RT PCR POSITIVE (*)    All other components within normal limits    EKG   Radiology No results found.  Procedures Procedures (including critical care time)  Medications Ordered in UC Medications - No data to display  Initial Impression / Assessment and Plan / UC Course  I have reviewed the triage vital signs and the nursing notes.  Pertinent labs & imaging results that were available during my care of the  patient were reviewed by me and considered in my medical decision making (see chart for details).     Reviewed negative rapid flu test with patient.  Reviewed positive COVID-19 test result.  Discussed various options for outpatient treatment of COVID.  Would consider Paxlovid but patient on multiple medications with possible interactions.  Will trial Molnupiravir instead.  Take 800 mg twice a day for 5 days.  May use Tessalon cough pills 1 every 8 hours as needed for cough.  May take Tylenol 1000 mg every 8 hours as needed for pain.  Rest.  Stay at home.  If he experiences any chest tightness, more difficulty breathing, Fever or worsening of cough, go to the ER ASAP.  Otherwise follow-up in 3 to 4 days with his PCP if minimal improvement.   Final Clinical Impressions(s) / UC Diagnoses   Final diagnoses:  [id number] virus infection  Sore throat  Acute cough     Discharge Instructions      Recommend start Molnupiravir 831m (4 capsules) twice a day for 5 days. May take Tessalon cough pills 1 every 8 hours as needed for cough. May take Tylenol 10042mevery 8 hours as needed for pain. If breathing gets more difficult, fever, or worsening of cough occurs, go to the ER ASAP. Otherwise, follow-up in 3 to 4 days with your  PCP if minimal improvement.      ED Prescriptions     Medication Sig Dispense Auth. Provider   molnupiravir EUA (LAGEVRIO) 200 MG CAPS capsule Take 4 capsules (800 mg total) by mouth 2 (two) times daily for 5 days. 40 capsule Katy Apo, NP   benzonatate (TESSALON) 200 MG capsule Take 1 capsule (200 mg total) by mouth 3 (three) times daily as needed for cough. 21 capsule Cap Massi, Nicholes Stairs, NP      PDMP not reviewed this encounter.   Katy Apo, NP 07/29/21 2306

## 2021-08-05 ENCOUNTER — Other Ambulatory Visit: Payer: Self-pay

## 2021-09-02 ENCOUNTER — Encounter: Payer: Self-pay | Admitting: Urology

## 2021-09-02 ENCOUNTER — Ambulatory Visit: Payer: Medicare Other | Admitting: Urology

## 2021-09-02 ENCOUNTER — Other Ambulatory Visit: Payer: Self-pay

## 2021-09-02 VITALS — BP 145/114 | HR 85 | Ht 70.0 in | Wt 174.0 lb

## 2021-09-02 DIAGNOSIS — N401 Enlarged prostate with lower urinary tract symptoms: Secondary | ICD-10-CM

## 2021-09-02 DIAGNOSIS — N138 Other obstructive and reflux uropathy: Secondary | ICD-10-CM | POA: Diagnosis not present

## 2021-09-02 DIAGNOSIS — N529 Male erectile dysfunction, unspecified: Secondary | ICD-10-CM | POA: Diagnosis not present

## 2021-09-02 DIAGNOSIS — N475 Adhesions of prepuce and glans penis: Secondary | ICD-10-CM | POA: Diagnosis not present

## 2021-09-02 MED ORDER — BETAMETHASONE DIPROPIONATE 0.05 % EX CREA: TOPICAL_CREAM | Freq: Two times a day (BID) | CUTANEOUS | 0 refills | Status: DC

## 2021-09-02 MED ORDER — TAMSULOSIN HCL 0.4 MG PO CAPS: 0.4000 mg | ORAL_CAPSULE | Freq: Every day | ORAL | 11 refills | Status: DC

## 2021-09-02 MED ORDER — TADALAFIL 5 MG PO TABS: 5.0000 mg | ORAL_TABLET | Freq: Every day | ORAL | 6 refills | Status: DC | PRN

## 2021-09-02 NOTE — Progress Notes (Signed)
09/02/21 12:36 PM   David [name] 12-07-1945 [id number]  CC: Penile adhesions, ED, urinary symptoms/BPH  HPI: 75 year old male on Plavix for CAD here with a number of urologic issues.  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  This can be painful with sexual activity when he gets an erection.  He has trouble with erections and is only able to penetrate with difficulty secondary to 50% firmness.  He has never tried any medications for this.  He also reports of trouble with a weak stream and difficulty with urinary flow.  Prostate measures 30 g from CT in 2018, and he has never been on any prostate medications.  He denies any gross hematuria.  Recent PSA in November 2022 was 2.86, well within the normal range for his age.  Reviewed, see HPI   PMH: Past Medical History:  Diagnosis Date   Anemia    CAD (coronary artery disease) 07/08/2011   Overview:  a. 07/2006: Anterior ST elevation MI. b. 07/2006: PCI with BMS to LAD and RCA. c. 09/2006: Non ST elevation. d. LVEF 30%.  Discussed ICD, not currently interested.    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 ([place]) 04/14/8888   Dyslipidemia 07/30/2011   Overview:  High triglycerides   GERD (gastroesophageal reflux disease) 07/08/2011   History of Clostridium difficile colitis    History of myocardial infarction    Hyperlipidemia, unspecified 07/08/2011   Hypothyroidism    Hypothyroidism due to acquired atrophy of thyroid 02/15/2015   Type II diabetes mellitus, uncontrolled 07/08/2011   Vitamin D deficiency     Surgical History: Past Surgical History:  Procedure Laterality Date   CARDIAC CATHETERIZATION     CHOLECYSTECTOMY     COLONOSCOPY  07/20/2006   Crohn's disease   CORONARY ANGIOGRAPHY N/A 07/07/2017   Procedure: CORONARY ANGIOGRAPHY;  Surgeon: Teodoro Spray, MD;  Location: [facility];  Service: Cardiovascular;  Laterality: N/A;   CORONARY STENT  PLACEMENT     FLEXIBLE SIGMOIDOSCOPY N/A 09/25/2016   Procedure: FLEXIBLE SIGMOIDOSCOPY;  Surgeon: [name], MD;  Location: [facility];  Service: Endoscopy;  Laterality: N/A;   [name] HEART CATH [name] 07/07/2017   Procedure: [name] Heart Cath;  Surgeon: Teodoro Spray, MD;  Location: [facility];  Service: Cardiovascular;  Laterality: [name];   MELANOMA REMOVED     parotid gland removal       Family History: Family History  Problem Relation Age of Onset   Heart disease Mother    Heart attack Father    Heart disease Sister    Heart disease Brother     Social History:  reports that he has never smoked. He has never used smokeless tobacco. He reports that he does not drink alcohol and does not use drugs.  Physical Exam: BP (!) 145/114   Pulse 85   Ht 5' 10"  (1.778 m)   Wt 174 lb (78.9 kg)   BMI 24.97 kg/m    Constitutional:  Alert and oriented, No acute distress. Cardiovascular: No clubbing, cyanosis, or edema. Respiratory: Normal respiratory effort, no increased work of breathing. GI: Abdomen is soft, nontender, nondistended, no abdominal masses GU: Uncircumcised phallus with significant circumferential dense adhesion of the foreskin to the glans DRE: 40 g, smooth, no nodules or masses  Laboratory Data: Reviewed, see HPI  Pertinent Imaging: I have personally viewed and interpreted the CT from January 2018 where prostate measures 39 g, no hydronephrosis.  Assessment & Plan:   75 year old male with a number of urologic issues today including dense penile adhesions, ED, and BPH.    I recommended a trial of a topical steroid cream for his significant penile adhesions, Cialis 5 to 15 mg on demand for his ED, and a trial of Flomax for his BPH and urinary symptoms.  Reassurance provided regarding his normal PSA value and DRE.  Consider testosterone check at follow-up.  RTC 2 to 5-monthsymptom check  BNickolas Madrid MD 09/02/2021  [facility] [address] [place] [phone]

## 2021-09-03 ENCOUNTER — Encounter: Payer: Self-pay | Admitting: Gastroenterology

## 2021-09-09 ENCOUNTER — Telehealth: Payer: Self-pay | Admitting: Oncology

## 2021-09-09 ENCOUNTER — Other Ambulatory Visit: Payer: Self-pay

## 2021-09-09 ENCOUNTER — Inpatient Hospital Stay: Payer: Medicare Other | Attending: Hematology and Oncology

## 2021-09-09 VITALS — BP 116/68 | HR 66 | Temp 97.0°F | Resp 18

## 2021-09-09 DIAGNOSIS — K5190 Ulcerative colitis, unspecified, without complications: Secondary | ICD-10-CM | POA: Insufficient documentation

## 2021-09-09 DIAGNOSIS — Z79899 Other long term (current) drug therapy: Secondary | ICD-10-CM | POA: Insufficient documentation

## 2021-09-09 DIAGNOSIS — K51919 Ulcerative colitis, unspecified with unspecified complications: Secondary | ICD-10-CM

## 2021-09-09 MED ORDER — SODIUM CHLORIDE 0.9 % IV SOLN
Freq: Once | INTRAVENOUS | Status: AC
  Filled 2021-09-09: qty 250

## 2021-09-09 MED ORDER — VEDOLIZUMAB 300 MG IV SOLR
300.0000 mg | Freq: Once | INTRAVENOUS | Status: AC
  Administered 2021-09-09: 300 mg via INTRAVENOUS
  Filled 2021-09-09: qty 5

## 2021-09-09 NOTE — Telephone Encounter (Signed)
Pt called and needs to reschedule his appt for today. Please give him a call back at [phone]

## 2021-09-10 ENCOUNTER — Encounter: Payer: Self-pay | Admitting: Gastroenterology

## 2021-09-10 ENCOUNTER — Ambulatory Visit (INDEPENDENT_AMBULATORY_CARE_PROVIDER_SITE_OTHER): Payer: Medicare Other | Admitting: Gastroenterology

## 2021-09-10 VITALS — BP 148/84 | HR 64 | Temp 98.2°F | Wt 173.0 lb

## 2021-09-10 DIAGNOSIS — Z9889 Other specified postprocedural states: Secondary | ICD-10-CM

## 2021-09-10 DIAGNOSIS — Z859 Personal history of malignant neoplasm, unspecified: Secondary | ICD-10-CM | POA: Diagnosis not present

## 2021-09-10 DIAGNOSIS — Z0000 Encounter for general adult medical examination without abnormal findings: Secondary | ICD-10-CM

## 2021-09-10 DIAGNOSIS — K5100 Ulcerative (chronic) pancolitis without complications: Secondary | ICD-10-CM

## 2021-09-10 NOTE — Patient Instructions (Signed)
Please continue your Entyvio and we will see you in 4 months. Happy Holidays!

## 2021-09-10 NOTE — Progress Notes (Signed)
David Bellows MD, MRCP(U.K) [address]  [place]  [place], [place] [address]  Main: [phone]  Fax: [phone]   Primary Care Physician: Kirk Ruths, MD  Primary Gastroenterologist:  Dr. Jonathon [name]   Chief complaint: Discuss about use of his biologic in the setting of recent diagnosis of invasive squamous cell carcinoma   HPI: David [name] is a 75 y.o. male  He has had ulcerative colitis from 2007 . 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 Remicaid d/c in 2009 due to the malignant skin lesions. Since 2009  He was being followed by [facility] till 2015 when his doctor retired. Hospitalized in 2011 for flare of the colitis. His symptoms returned in early December 2017  when I took over his care .  Sigmoidoscopy demonstrated severe left-sided colitis.   He was treated with oral steroids with resolution of rectal bleeding and discharged  4 days after discharge he returned to the hospital with severe colitis,readmitted 10/27/16 with ecoli sepsis/bacteriemia/fevers while on prednisone 40 mg .found to have pancolitis on CT scan.  Thrombosis of the inferior mesenteric vein.  We wanted to transfer him to a tertiary center but he refused . He was seen at [facility] Dr Daphane Shepherd who specializes in IBD for a second opinion as he has had prior skin cancer with regards to options. She saw him on 12/01/16.Commenced on remicaid 01/05/17. 3 days after the first infusion had no blood or diarrhea and firming up of stools.Commenced on Entyvio in 05/2017 due to severe reaction to Remicade.  In the past on multiple occasions  vaccination for hepatitis a and B pneumococcal vaccination has been recommended.  On multiple occasions I have discussed about colon cancer cancer screening, dysplasia surveillance with a colonoscopy and explained the very high risk of colon cancer in his  case and strong recommendations to undergo the same but he has refused every single time. He underwent a DEXA scan at [facility] clinic in February 2020 which shows osteopenia        Interval history 07/09/2021-09/10/2021   07/10/2021: TB QuantiFERON negative, hemoglobin 13 g MCV 86, creatinine 0.95 LFTs normal, CRP 1.3  He called our office a few days back and informed that he had been to the dermatologist and was found to have an invasive squamous cell carcinoma which was resected.  He wanted know if he should continue his Entyvio infusions.  I informed him to come to my office to discuss further.  No GI issues at this point of time denies any rectal bleeding denies any abdominal pain. He is due to have a full body skin exam with a dermatologist in February.    Current Outpatient Medications  Medication Sig Dispense Refill   ACCU-CHEK AVIVA PLUS test strip 1 each by Other route as needed for other.      ACCU-CHEK SOFTCLIX LANCETS lancets USE 2 TIMES DAILY. USE AS INSTRUCTED.     amiodarone (PACERONE) 200 MG tablet Take 200 mg by mouth daily.     aspirin EC 81 MG tablet Take 81 mg by mouth daily.      atorvastatin (LIPITOR) 40 MG tablet Take 40 mg by mouth 2 (two) times a day.      benzonatate (TESSALON) 200 MG capsule Take 1 capsule (200 mg total) by mouth 3 (three) times daily as  needed for cough. 21 capsule 0   betamethasone dipropionate 0.05 % cream Apply topically 2 (two) times daily. 30 g 0   carvedilol (COREG) 3.125 MG tablet Take 3.125 mg by mouth 2 (two) times daily with a meal.      Cholecalciferol (D 5000) 125 MCG (5000 UT) capsule Take 5,000 Units by mouth daily.      clopidogrel (PLAVIX) 75 MG tablet TAKE 1 TABLET BY MOUTH ONCE DAILY     gabapentin (NEURONTIN) 300 MG capsule Take 300 mg by mouth 3 (three) times daily.     glipiZIDE (GLUCOTROL) 10 MG tablet Take 10 mg by mouth 2 (two) times daily.      levothyroxine (SYNTHROID) 75 MCG tablet Take 75 mcg by mouth daily before  breakfast.     Multiple Vitamins-Minerals (CENTRUM SILVER 50+MEN PO) Take by mouth.     Omega-3 Fatty Acids (FISH OIL PO) Take 1,200 mg by mouth 2 (two) times daily.      omeprazole (PRILOSEC) 20 MG capsule TAKE 1 CAPSULE BY MOUTH  ONCE DAILY.  MAKE SURE TO  TAKE 12 HOURS APART FROM  PLAVIX.     prednisoLONE acetate (PRED FORTE) 1 % ophthalmic suspension      Probiotic Product (PROBIOTIC DAILY PO) Take 1 capsule by mouth daily.      pyridOXINE (VITAMIN B-6) 100 MG tablet Take 100 mg by mouth daily.     tadalafil (CIALIS) 5 MG tablet Take 1-3 tablets (5-15 mg total) by mouth daily as needed for erectile dysfunction. 30 tablet 6   tamsulosin (FLOMAX) 0.4 MG CAPS capsule Take 1 capsule (0.4 mg total) by mouth daily. 30 capsule 11   traMADol (ULTRAM) 50 MG tablet 1 tablet in the morning and at bedtime.     VALTREX 1 g tablet Take 1,000 mg by mouth 3 (three) times daily.     vedolizumab (ENTYVIO) 300 MG injection Inject into the vein. Every 8 weeks 1 each 6   vitamin B-12 (CYANOCOBALAMIN) 1000 MCG tablet Take 1,000 mcg by mouth daily.     No current facility-administered medications for this visit.    Allergies as of 09/10/2021 - Review Complete 09/10/2021  Allergen Reaction Noted   Mesalamine Nausea Only and Nausea And Vomiting 12/01/2016    ROS:  General: Negative for anorexia, weight loss, fever, chills, fatigue, weakness. ENT: Negative for hoarseness, difficulty swallowing , nasal congestion. CV: Negative for chest pain, angina, palpitations, dyspnea on exertion, peripheral edema.  Respiratory: Negative for dyspnea at rest, dyspnea on exertion, cough, sputum, wheezing.  GI: See history of present illness. GU:  Negative for dysuria, hematuria, urinary incontinence, urinary frequency, nocturnal urination.  Endo: Negative for unusual weight change.    Physical Examination:   BP (!) 148/84   Pulse 64   Temp 98.2 F (36.8 C) (Oral)   Wt 173 lb (78.5 kg)   BMI 24.82 kg/m   General:  Well-nourished, well-developed in no acute distress.  Eyes: No icterus. Conjunctivae pink. Psych: Alert and cooperative, normal mood and affect.   Imaging Studies: No results found.  Assessment and Plan:   David [name] is a 76 y.o. y/o male  here to follow up . He has a history of long standing ulcerative colitis- off treatment for many years and recent onset of symptoms in 2017 while off all therapy .Prior history of skin cancer (melanoma and he recalls basal cell ca) in the past Previously colectomy was discussed but he was not keen.  Commenced on infliximab  on 01/05/17 . Had severe reactions after the infusions and decided to change to [facility] in 05/2017 .  Clinically and biochemically in remission.He has been previously recommended annual skin exam , immunizations and a colonoscopy . Discussed extensively over prior visits, he is aware of the high risk of colon cancer from longstanding ulcerative colitis, chance of infection but had not not keen on any of these interventions..  Since his last visit he has been diagnosed with squamous cell carcinoma of the skin and I discussed with him that based on present literature there is no increased risk of cancer in patients were treated with Entyvio and there is no increased risk of cancer even with a history of cancer in the past.  I have provided him the journal article from inflammatory bowel diseases dated December 18, 2020 by Hong SJ et al  titled "ustekinumab and vedolizumab appears safe to use in patients with inflammatory bowel disease and a history of cancer"       Plan  1.  TB QuantiFERON due in 07/01/2022. 2.  Continue with Entyvio infusions: Benefits exceed the risks limited options if he were to stop Entyvio and we will keep him in remission 3.  Strongly stressed the need and importance of vaccination, colonoscopy for dysplasia surveillance, .  He is aware of the risks of not doing so but is not keen on doing it at this point of time.  He has  been explained very clearly that if he changes his mind he should come to my office to schedule the above.   Dr David Bellows  MD,MRCP [facility]) Follow up in 4 months

## 2021-10-08 ENCOUNTER — Other Ambulatory Visit: Payer: Self-pay | Admitting: Urology

## 2021-10-14 ENCOUNTER — Ambulatory Visit: Payer: Medicare Other | Admitting: Urology

## 2021-10-14 ENCOUNTER — Other Ambulatory Visit: Payer: Self-pay

## 2021-10-14 ENCOUNTER — Encounter: Payer: Self-pay | Admitting: Urology

## 2021-10-14 VITALS — BP 174/92 | HR 87 | Ht 72.0 in | Wt 171.0 lb

## 2021-10-14 DIAGNOSIS — N401 Enlarged prostate with lower urinary tract symptoms: Secondary | ICD-10-CM | POA: Diagnosis not present

## 2021-10-14 DIAGNOSIS — N138 Other obstructive and reflux uropathy: Secondary | ICD-10-CM

## 2021-10-14 DIAGNOSIS — N529 Male erectile dysfunction, unspecified: Secondary | ICD-10-CM

## 2021-10-14 DIAGNOSIS — N4889 Other specified disorders of penis: Secondary | ICD-10-CM

## 2021-10-14 LAB — BLADDER SCAN AMB NON-IMAGING

## 2021-10-14 NOTE — Progress Notes (Signed)
° °  10/14/2021 11:04 AM   David [name] [birth year] [id number]  Reason for visit: Follow up circumferential penile adhesions, BPH, ED  HPI: 76 year old male who I saw for the above issues in November 2022.  We started Flomax for his BPH, Cialis for the ED, and a trial of twice daily betamethasone cream for the dense circumferential penile adhesions.  He has used the cream with some improvement of the adhesions, but has some raw areas where the adhesions are coming free.  On exam there is no evidence of infection, or active bleeding.  I recommended continuing the cream with gentle retraction of the foreskin, and considering using Telfa bandage to prevent sticking to his underwear.  There is a skin bridge dorsally that may require takedown in clinic if not improved after another 4 to 6 weeks of betamethasone cream.  In terms of his urinary symptoms, his nocturia has improved on the Flomax, but he still feels like he has some weak and intermittent stream.  The Cialis certainly helped with his erections, but he was having significant pain secondary to the adhesions and wheezes once.  -I recommended focusing on the penile adhesions and continuing the betamethasone cream for another 4 to 6 weeks prior to considering a procedure in clinic for takedown of the skin bridging.   -RTC 4 to 6 weeks repeat physical exam, consider takedown of skin bridge at that time if persistent symptoms   Billey Co, MD  [facility] [address] [place], [place] [address] [phone]

## 2021-11-08 ENCOUNTER — Encounter: Payer: Self-pay | Admitting: Gastroenterology

## 2021-11-10 ENCOUNTER — Telehealth: Payer: Self-pay

## 2021-11-10 NOTE — Telephone Encounter (Signed)
Patient was contacted and notified to let him know that the reason his Entyvio infusions were cancelled was because the [facility] was closed. I also was told that the [facility] is not going to do infusions to non-cancer patients. Therefore, I have to figure out where patient could have his infusion at. I applied to [facility] pharmacy to see if they could give patient his Entyvio at his home. Hopefully I get notified soon. Patient understood for the moment. I will call him back once I know a little more.

## 2021-11-11 ENCOUNTER — Ambulatory Visit: Payer: Medicare Other

## 2021-11-13 ENCOUNTER — Other Ambulatory Visit: Payer: Self-pay

## 2021-11-13 MED ORDER — VEDOLIZUMAB 300 MG IV SOLR: INTRAVENOUS | 6 refills | Status: DC

## 2021-11-13 NOTE — Telephone Encounter (Signed)
Patient reached out to [place] via MyChart to let us know that he had received a letter stating that he was able to receive his Entyvio through same day surgery. I then called same day surgery at [facility] and they stated that they will be reaching out to the patient to schedule his infusion. Patient then was reached out and he has his Entyvio infusion scheduled for 11/20/2021.

## 2021-11-19 ENCOUNTER — Other Ambulatory Visit: Payer: Self-pay

## 2021-11-19 ENCOUNTER — Ambulatory Visit
Admission: RE | Admit: 2021-11-19 | Discharge: 2021-11-19 | Disposition: A | Discharge status: Home / Self Care | Payer: Medicare Other | Source: Ambulatory Visit | Attending: Gastroenterology | Admitting: Gastroenterology

## 2021-11-19 DIAGNOSIS — C61 Malignant neoplasm of prostate: Secondary | ICD-10-CM | POA: Insufficient documentation

## 2021-11-19 DIAGNOSIS — N471 Phimosis: Secondary | ICD-10-CM | POA: Diagnosis not present

## 2021-11-19 DIAGNOSIS — N475 Adhesions of prepuce and glans penis: Secondary | ICD-10-CM | POA: Diagnosis present

## 2021-11-19 MED ORDER — VEDOLIZUMAB 300 MG IV SOLR
300.0000 mg | Freq: Once | INTRAVENOUS | Status: AC
  Administered 2021-11-19: 300 mg via INTRAVENOUS
  Filled 2021-11-19: qty 5

## 2021-11-20 ENCOUNTER — Ambulatory Visit: Admission: RE | Admit: 2021-11-20 | Payer: Medicare Other | Source: Ambulatory Visit

## 2021-11-25 ENCOUNTER — Encounter: Payer: Self-pay | Admitting: Urology

## 2021-11-25 ENCOUNTER — Other Ambulatory Visit: Payer: Self-pay

## 2021-11-25 ENCOUNTER — Other Ambulatory Visit: Payer: Self-pay | Admitting: Urology

## 2021-11-25 ENCOUNTER — Ambulatory Visit: Payer: Medicare Other | Admitting: Urology

## 2021-11-25 VITALS — BP 128/76 | HR 76 | Ht 71.0 in | Wt 172.0 lb

## 2021-11-25 DIAGNOSIS — N4889 Other specified disorders of penis: Secondary | ICD-10-CM

## 2021-11-25 DIAGNOSIS — N475 Adhesions of prepuce and glans penis: Secondary | ICD-10-CM

## 2021-11-25 MED ORDER — HYDROCORTISONE 1 % EX OINT: 1.0000 "application " | TOPICAL_OINTMENT | Freq: Two times a day (BID) | CUTANEOUS | 0 refills | Status: DC

## 2021-11-25 NOTE — Progress Notes (Signed)
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  He has been interested in resuming sexual activity and is able to achieve erections with Cialis, but these are very painful with the adhesions.  He does not want a circumcision.  We previously have tried betamethasone cream twice daily over the last 6 weeks with only minimal improvement.  He is frustrated and is interested in other options.  On exam, he has dense penile adhesions circumferentially around the penis.  I am able to take these down slightly, but he has a fair amount of pain on exam and does not tolerate this in clinic.  I recommended trying a stronger cortisone cream over the next 4 weeks, and pursuing takedown of the adhesions in the OR with sedation in 4 to 6 weeks if not improved on the new cortisone cream.  He does not want to pursue circumcision.  Billey Co, [facility] [address] [place], [place] [address] [phone]

## 2021-11-25 NOTE — Progress Notes (Signed)
Surgical Physician Order Form [facility] [place]  * Scheduling expectation :  4 to 6 weeks  *Length of Case: 30 minutes  *Clearance needed: no  *Anticoagulation Instructions: Hold Plavix  *Aspirin Instructions: Hold Plavix, okay to continue aspirin  *Post-op visit Date/Instructions:  3 month follow up  *Diagnosis: Penile adhesions  *Procedure: Takedown of penile adhesions   Additional orders: N/A  -Admit type: OUTpatient  -Anesthesia: MAC  -VTE Prophylaxis Standing Order SCDs       Other:   -Standing Lab Orders Per Anesthesia    Lab other: None  -Standing Test orders EKG/Chest x-ray per Anesthesia       Test other:   - Medications: None  -Other orders:  N/A

## 2021-11-26 NOTE — Telephone Encounter (Signed)
The burning is likely from the raw areas, would avoid any cream for 3 to 4 days, then try a full 4 weeks of the hydrocortisone prior to proceeding with surgery.  It is okay to schedule surgery for 6 weeks from now in case he does not have improvement on the new cream.  Nickolas Madrid, MD 11/26/2021

## 2021-11-28 NOTE — Telephone Encounter (Signed)
Would recommend trying the hydrocortisone cream as we discussed, as surgery to take down these adhesions will involve a much longer recovery and soreness for at least a few weeks.  I think it is worth a try of this different and stronger cream prior to rushing to a surgical procedure with a longer recovery  Nickolas Madrid, MD 11/28/2021

## 2021-12-05 ENCOUNTER — Telehealth: Payer: Self-pay | Admitting: Urgent Care

## 2021-12-05 ENCOUNTER — Telehealth: Payer: Self-pay

## 2021-12-05 NOTE — Progress Notes (Signed)
°  Perioperative Services Pre-Admission/Anesthesia Testing     Date: 12/05/21  Name: David [name] MRN:   [id number]  Re: Request from surgery for clearance prior to scheduled procedure  Patient is scheduled to undergo LYSIS OF PENILE ADHESIONS on 01/02/2022 with Dr. Nickolas Madrid. Patient has not been scheduled for his PAT appointment at this point, thus has not undergone review by PAT RN and/or APP. Received communication from primary attending surgeon's office requesting that patient be submitted for clearance from cardiology.   PROVIDER SPECIALTY FAXED TO  Bartholome Bill, MD  Cardiology  [phone]   Plan:  Clearance documents generated and faxed to appropriate provider(s) as noted above. Note will be updated to reflect communication with provider's office as it relates to clearance being provided and/or the need for office visit prior to clearance for surgery being issued.   Honor Loh, MSN, APRN, FNP-C, CEN [facility]  Peri-operative Services Nurse Practitioner Phone: [phone] 12/05/21 4:09 PM  NOTE: This note has been prepared using Dragon dictation software. Despite my best ability to proofread, there is always the potential that unintentional transcriptional errors may still occur from this process.

## 2021-12-05 NOTE — Telephone Encounter (Signed)
I spoke with David [name]. We have discussed possible surgery dates and Friday M was agreed upon by all parties. Patient given information about surgery date, what to expect pre-operatively and post operatively.  We discussed that a Pre-Admission Testing office will be calling to set up the pre-op visit that will take place prior to surgery, and that these appointments are typically done over the phone with a Pre-Admissions RN. Informed patient that our office will communicate any additional care to be provided after surgery. Patients questions or concerns were discussed during our call. Advised to call our office should there be any additional information, questions or concerns that arise. Patient verbalized understanding.

## 2021-12-05 NOTE — Progress Notes (Signed)
[facility] Posting Form   Surgery Date/Time: Date: 01/02/2022  Surgeon: Dr. 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

## 2021-12-09 ENCOUNTER — Telehealth: Payer: Self-pay | Admitting: Urgent Care

## 2021-12-09 NOTE — Progress Notes (Signed)
°  Perioperative Services Pre-Admission/Anesthesia Testing     Date: 12/09/21  Name: David [name] MRN:   [id number]  Re: Surgical clearance  Surgical clearance received from Dr. Mel Almond office (cardiology). Patient has been cleared for the planned LYSIS OF PENILE ADHESIONS procedure scheduled for 01/02/2022 with Dr. Nickolas Madrid, MD.  Cardiology notes that patient may proceed with an overall LOW risk stratification.  He has been cleared to hold his DAPT therapy (ASA + clopidogrel), if required by surgeon" for 5 days prior to his procedure with plans to restart as soon as postoperative bleeding risk felt to be minimized. Copy of signed clearance form placed on patient's OR chart for review by the surgical/anesthetic team on the day of his procedure.   Honor Loh, MSN, APRN, FNP-C, CEN [facility]  Peri-operative Services Nurse Practitioner Phone: [phone] 12/09/21 12:30 PM

## 2021-12-18 ENCOUNTER — Encounter: Payer: Self-pay | Admitting: Gastroenterology

## 2021-12-26 ENCOUNTER — Encounter
Admission: RE | Admit: 2021-12-26 | Discharge: 2021-12-26 | Disposition: A | Discharge status: Home / Self Care | Payer: Medicare Other | Source: Ambulatory Visit | Attending: Urology | Admitting: Urology

## 2021-12-26 ENCOUNTER — Other Ambulatory Visit: Payer: Self-pay

## 2021-12-26 VITALS — Ht 72.0 in | Wt 165.0 lb

## 2021-12-26 DIAGNOSIS — I10 Essential (primary) hypertension: Secondary | ICD-10-CM

## 2021-12-26 DIAGNOSIS — I236 Thrombosis of atrium, auricular appendage, and ventricle as current complications following acute myocardial infarction: Secondary | ICD-10-CM

## 2021-12-26 DIAGNOSIS — I255 Ischemic cardiomyopathy: Secondary | ICD-10-CM

## 2021-12-26 DIAGNOSIS — E118 Type 2 diabetes mellitus with unspecified complications: Secondary | ICD-10-CM

## 2021-12-26 DIAGNOSIS — I5022 Chronic systolic (congestive) heart failure: Secondary | ICD-10-CM

## 2021-12-26 DIAGNOSIS — E785 Hyperlipidemia, unspecified: Secondary | ICD-10-CM

## 2021-12-26 DIAGNOSIS — R0602 Shortness of breath: Secondary | ICD-10-CM

## 2021-12-26 HISTORY — DX: Acute myocardial infarction, unspecified: I21.9

## 2021-12-26 HISTORY — DX: Malignant (primary) neoplasm, unspecified: C80.1

## 2021-12-26 HISTORY — DX: Unspecified osteoarthritis, unspecified site: M19.90

## 2021-12-26 NOTE — Patient Instructions (Signed)
Your procedure is scheduled on: 01/02/22 Report to [facility]. To find out your arrival time please call [phone] between 1PM - 3PM on 01/01/22.  Remember: Instructions that are not followed completely may result in serious medical risk, up to and including death, or upon the discretion of your surgeon and anesthesiologist your surgery may need to be rescheduled.     _X__ 1. Do not eat food or drink any liquids after midnight the night before your procedure.                 No gum chewing or hard candies.   __X__2.  On the morning of surgery brush your teeth with toothpaste and water, you                 may rinse your mouth with mouthwash if you wish.  Do not swallow any              toothpaste of mouthwash.     _X__ 3.  No Alcohol for 24 hours before or after surgery.   _X__ 4.  Do Not Smoke or use e-cigarettes For 24 Hours Prior to Your Surgery.                 Do not use any chewable tobacco products for at least 6 hours prior to                 surgery.  ____  5.  Bring all medications with you on the day of surgery if instructed.   __X__  6.  Notify your doctor if there is any change in your medical condition      (cold, fever, infections).     Do not wear jewelry, make-up, hairpins, clips or nail polish. Do not wear lotions, powders, or perfumes. You may wear deodorant if you wish. Do not shave body hair 48 hours prior to surgery. Men may shave face and neck. Do not bring valuables to the hospital.    [facility] is not responsible for any belongings or valuables.  Contacts, dentures/partials or body piercings may not be worn into surgery. Bring a case for your contacts, glasses or hearing aids, a denture cup will be supplied. Leave your suitcase in the car. After surgery it may be brought to your room. For patients admitted to the hospital, discharge time is determined by your treatment team.   Patients discharged  the day of surgery will not be allowed to drive home.   Please read over the following fact sheets that you were given:     __X__ Take these medicines the morning of surgery with A SIP OF WATER:    1. acyclovir (ZOVIRAX) 400 MG tablet  2. atorvastatin (LIPITOR) 40 MG tablet  3. carvedilol (COREG) 3.125 MG tablet  4. gabapentin (NEURONTIN) 300 MG capsule  5. levothyroxine (SYNTHROID) 75 MCG tablet  6. omeprazole (PRILOSEC) 20 MG capsule  7. traMADol (ULTRAM) 50 MG tablet if needed  ____ Fleet Enema (as directed)   ____ Use CHG Soap/SAGE wipes as directed  ____ Use inhalers on the day of surgery  ____ Stop metformin/Janumet/Farxiga 2 days prior to surgery    ____ Take 1/2 of usual insulin dose the night before surgery. No insulin the morning          of surgery.   __X__ Stop Blood Thinners 5 DAYS BEFORE SURGERY (PLAVIS AND ASPIRIN) LAST DOSE Sunday 12/28/21  __X__  Stop Anti-inflammatories 7 days before surgery such as Advil, Ibuprofen, Motrin,  BC or Goodies Powder, Naprosyn, Naproxen, Aleve    __X__ Stop all herbals supplements, fish oil or vitamins for 7 days until after surgery. Vitamin D, Multi Vitamin, Beta Prostate, Fish Oil   ____ Bring C-Pap to the hospital.

## 2021-12-30 ENCOUNTER — Other Ambulatory Visit: Payer: Self-pay

## 2021-12-30 ENCOUNTER — Other Ambulatory Visit
Admission: RE | Admit: 2021-12-30 | Discharge: 2021-12-30 | Disposition: A | Discharge status: Home / Self Care | Payer: Medicare Other | Source: Ambulatory Visit | Attending: Urology | Admitting: Urology

## 2021-12-30 DIAGNOSIS — I5022 Chronic systolic (congestive) heart failure: Secondary | ICD-10-CM

## 2021-12-30 DIAGNOSIS — I252 Old myocardial infarction: Secondary | ICD-10-CM | POA: Diagnosis not present

## 2021-12-30 DIAGNOSIS — I2510 Atherosclerotic heart disease of native coronary artery without angina pectoris: Secondary | ICD-10-CM | POA: Diagnosis not present

## 2021-12-30 DIAGNOSIS — E785 Hyperlipidemia, unspecified: Secondary | ICD-10-CM

## 2021-12-30 DIAGNOSIS — Z955 Presence of coronary angioplasty implant and graft: Secondary | ICD-10-CM | POA: Insufficient documentation

## 2021-12-30 DIAGNOSIS — E118 Type 2 diabetes mellitus with unspecified complications: Secondary | ICD-10-CM | POA: Diagnosis not present

## 2021-12-30 DIAGNOSIS — Z01818 Encounter for other preprocedural examination: Secondary | ICD-10-CM | POA: Diagnosis present

## 2021-12-30 DIAGNOSIS — R0602 Shortness of breath: Secondary | ICD-10-CM

## 2021-12-30 DIAGNOSIS — I255 Ischemic cardiomyopathy: Secondary | ICD-10-CM

## 2021-12-30 DIAGNOSIS — I10 Essential (primary) hypertension: Secondary | ICD-10-CM

## 2021-12-30 DIAGNOSIS — Z01810 Encounter for preprocedural cardiovascular examination: Secondary | ICD-10-CM | POA: Diagnosis not present

## 2021-12-30 DIAGNOSIS — I236 Thrombosis of atrium, auricular appendage, and ventricle as current complications following acute myocardial infarction: Secondary | ICD-10-CM

## 2021-12-30 LAB — CBC
HCT: 37.1 % — ABNORMAL LOW (ref 39.0–52.0)
Hemoglobin: 12.9 g/dL — ABNORMAL LOW (ref 13.0–17.0)
MCH: 31.9 pg (ref 26.0–34.0)
MCHC: 34.8 g/dL (ref 30.0–36.0)
MCV: 91.8 fL (ref 80.0–100.0)
Platelets: 304 10*3/uL (ref 150–400)
RBC: 4.04 MIL/uL — ABNORMAL LOW (ref 4.22–5.81)
RDW: 14.5 % (ref 11.5–15.5)
WBC: 6.5 10*3/uL (ref 4.0–10.5)
nRBC: 0 % (ref 0.0–0.2)

## 2021-12-30 LAB — BASIC METABOLIC PANEL
Anion gap: 7 (ref 5–15)
BUN: 13 mg/dL (ref 8–23)
CO2: 30 mmol/L (ref 22–32)
Calcium: 9.3 mg/dL (ref 8.9–10.3)
Chloride: 100 mmol/L (ref 98–111)
Creatinine, Ser: 0.8 mg/dL (ref 0.61–1.24)
GFR, Estimated: >60 mL/min (ref >60)
Glucose, Bld: 101 mg/dL — ABNORMAL HIGH (ref 70–99)
Potassium: 3.9 mmol/L (ref 3.5–5.1)
Sodium: 137 mmol/L (ref 135–145)

## 2021-12-31 ENCOUNTER — Encounter: Payer: Self-pay | Admitting: Urology

## 2022-01-01 ENCOUNTER — Encounter: Payer: Self-pay | Admitting: Urology

## 2022-01-01 NOTE — Progress Notes (Signed)
Perioperative Services  Pre-Admission/Anesthesia Testing Clinical Review  Date: 01/01/22  Patient Demographics:  Name: David [name] DOB:   1946-07-06 MRN:   [id number]  Planned Surgical Procedure(s):    Case: [id number] Date/Time: 01/02/22 1635   Procedure: LYSIS OF PENILE ADHESIONS   Anesthesia type: Monitor Anesthesia Care   Pre-op diagnosis: Penile Adhesions   Location: [facility] OR ROOM 10 / [facility]   Surgeons: Sondra Come, MD   NOTE: Available PAT nursing documentation and vital signs have been reviewed. Clinical nursing staff has updated patient's PMH/PSHx, current medication list, and drug allergies/intolerances to ensure comprehensive history available to assist in medical decision making as it pertains to the aforementioned surgical procedure and anticipated anesthetic course. Extensive review of available clinical information performed. [place] PMH and PSHx updated with any diagnoses/procedures that  may have been inadvertently omitted during his intake with the pre-admission testing department's nursing staff.  Clinical Discussion:  David [name] is a 76 y.o. male who is submitted for pre-surgical anesthesia review and clearance prior to him undergoing the above procedure. Patient has never been a smoker. Pertinent PMH includes: CAD, MI x 2, ischemic cardiomyopathy, CHF, left ventricular mural thrombus, inferior mesenteric vein thrombosis, SVT, HTN, HLD, T2DM, hypothyroidism, GERD (on daily PPI), anemia, OA.  Patient is followed by cardiology Lady Gary, MD). He was last seen in the cardiology clinic on 11/18/2021; notes reviewed.  At the time of this clinic visit, patient reported to be stable from a cardiovascular perspective.  He denied any episodes of chest pain, shortness of breath, PND, orthopnea, palpitations, significant peripheral edema, or presyncope/syncope.  Patient with positional vertiginous symptoms and fatigue.  Patient with a past  medical history significant for cardiovascular diagnoses.  Patient suffered a anterior wall STEMI on 08/03/2006.  Diagnostic left heart catheterization performed revealing a reduced left ventricular systolic function with an EF of 48%.  There was multivessel CAD; 90% proximal RCA, and 99% mid LAD.  PCI was performed placing a 2.0 x 28 mm mini Vision BMS to the distal LAD and a 2.25 x 12 mm mini Vision BMS to the proximal LAD.  Plans were for a staged PCI of the RCA.  Staged PCI procedure was performed on 08/05/2006 placing a 3.0 x 18 mm Driver BMS to the proximal RCA.  Cardiac MRI performed on 09/15/2006 revealing a significantly reduced left ventricular systolic function with an EF of 35-40%.  There was inferior akinesia noted.  There was mild tricuspid valve regurgitation.  There was no evidence of valvular stenosis.  Study revealed delayed tracer enhancement and near transmural hyperenhancement in the inferior wall and the apex consistent with myocardial infarction in the LAD and RCA distributions.  Following his initial STEMI, patient suffered a NSTEMI approximately 2 months later on 09/11/2006.  Cardiovascular event further complicated by the development of a left ventricular apical thrombus.  Last myocardial perfusion imaging study performed on 06/07/2017 revealing a moderately reduced left ventricular systolic function with an EF of 40%.  There was inferior wall hypokinesis.  SPECT images revealed a moderate perfusion abnormality in the inferior region with stress and partial redistribution with rest.  Study determined to be moderate to high risk.  Diagnostic left heart catheterization performed on 07/07/2017 revealing a severely reduced left ventricular systolic function with an EF of 25-35%.  There was 100% occlusion of the RCA noted.  10% stenoses noted in the mid and distal LAD.  BMS in the RCA occluded with collaterals from the  LAD.  BMS x 2 in the LAD patent.  Patient not felt to be a  candidate for further intervention.  Patient treated with aggressive medical management.   Last TTE was performed on 03/13/2021 revealing a moderately reduced left ventricular systolic function with mild LVH; LVEF 40%.  Global hypokinesis noted diastolic Doppler parameters consistent with abnormal relaxation (G1DD).  There was trivial AR/PR and mild MR/TR.  There was no evidence of a significant transvalvular gradient to suggest stenosis.   Given that the patient has older generation stents (BMS) in place, patient remains on daily DAPT therapy (ASA + clopidogrel).  Patient is reported to be compliant with therapy with no evidence or reports of GI bleeding.  Patient previously on ARB/ANRi Sherryll Burger), however due to associated hypotension, this medication was discontinued.  Cardiology has discussed need for AICD placement, however patient has voiced that he was not interested in the past.  Blood pressure currently well controlled at 120/72 on currently prescribed beta-blocker monotherapy.  Patient is on a statin + omega-3 fatty acid for his HLD diagnosis and further ASCVD prevention. Patient has an erectile dysfunction diagnosis for which he uses prescribed PDE5i (tadalafil) medication.  T2DM well-controlled on currently prescribed regimen; last HgbA1c was 6.2% when checked on 07/16/2021. Functional capacity, as defined by DASI, is documented as being >/= 4 METS.  No changes were made to patient medication regimen.  Patient follow-up with outpatient cardiology in 3 months or sooner if needed.  David [name] is scheduled for an LYSIS OF PENILE ADHESIONS on 01/02/2022 with Dr. Legrand Rams, MD. Given patient's past medical history significant for cardiovascular diagnoses, presurgical cardiac clearance was sought by the PAT team. Per cardiology, "this patient is optimized for surgery and may proceed with the planned procedural course with a LOW risk of significant perioperative cardiovascular complications".   Again, this patient is on daily DAPT therapy.  He has been instructed on recommendations from his cardiologist for holding both his ASA and clopidogrel for 5 days prior to his procedure with plans to restart as soon as postoperatively respectively minimized by his primary attending surgeon.  The patient is aware that his last dose of his DAPT medications should be on 12/27/2021.  Patient denies previous perioperative complications with anesthesia in the past. In review of the EMR, there are no records available for review regarding patient's past surgical/anesthetic courses within the [facility] system.     12/26/2021   10:40 AM 11/25/2021    9:27 AM 11/19/2021    2:21 PM  Vitals with BMI  Height 6\' 0"  5\' 11"    Weight 165 lbs 172 lbs   BMI 22.37 24   Systolic  128   Diastolic  76   Pulse  76      Information is confidential and restricted. Go to Review Flowsheets to unlock data.   Providers/Specialists:   NOTE: Primary physician provider listed below. Patient may have been seen by APP or partner within same practice.   PROVIDER ROLE / SPECIALTY LAST Lise Auer, MD Urology (Surgeon) 11/25/2021  Lauro Regulus, MD Primary Care Provider 08/26/2021  Harold Hedge, MD Cardiology 11/18/2021   Allergies:  Mesalamine  Current Home Medications:   No current facility-administered medications for this encounter.    acyclovir (ZOVIRAX) 400 MG tablet   aspirin EC 81 MG tablet   atorvastatin (LIPITOR) 40 MG tablet   betamethasone dipropionate 0.05 % cream   carvedilol (COREG) 3.125 MG tablet   Cholecalciferol (D 5000)  125 MCG (5000 UT) capsule   clopidogrel (PLAVIX) 75 MG tablet   gabapentin (NEURONTIN) 300 MG capsule   glipiZIDE (GLUCOTROL) 10 MG tablet   levothyroxine (SYNTHROID) 75 MCG tablet   Multiple Vitamins-Minerals (CENTRUM SILVER 50+MEN PO)   Omega-3 Fatty Acids (FISH OIL) 1200 MG CAPS   omeprazole (PRILOSEC) 20 MG capsule   OVER THE COUNTER MEDICATION    Probiotic Product (PROBIOTIC DAILY PO)   traMADol (ULTRAM) 50 MG tablet   vedolizumab (ENTYVIO) 300 MG injection   ACCU-CHEK AVIVA PLUS test strip   ACCU-CHEK SOFTCLIX LANCETS lancets   hydrocortisone 1 % ointment   tadalafil (CIALIS) 5 MG tablet   tamsulosin (FLOMAX) 0.4 MG CAPS capsule   History:   Past Medical History:  Diagnosis Date   Anemia    Arthritis of both hands    CAD (coronary artery disease) 08/03/2006   a.) Anterior STEMI 08/03/2006 --> LHC EF 48%; 90% pRCA, 99% mLAD --> PCI placing 2.0 x 28 mm Mini-Vision BMS to dLAD and 2.25 x 12 mm Mini-Vision BMS to pLAD; plans for staged PCI of RCA. b.) Staged PCI 10/25/20017: 2.0 x 18 mm Driver BMS to pRCA. c.) NSTEMI 09/2006. d.) LHC 07/07/2017: EF 25-35%; 100% mRCA; patent BMS x 2 to LAD; BMS to the RCA totally occ; not amenable to further intervention.   CHF (congestive heart failure) (HCC)    a.) TTE 09/17/2006: EF 40%; LV mural thrombus; mild LVH; apical AK, inf HK; triv MR/TR/PR; G1DD. b.) TTE 06/16/2007: EF 35%; diff HK; apical AK; mild LA dil, triv AR/TR/PR, mild MR. c.) TTE 07/31/2010: EF 30%; septal HK, apical/inf/post AK; triv AR/MR; G1DD. d.) TTE 07/02/2017: EF 35%; mild LVH; diff HK; panval regurg. e.) TTE 03/13/2021: EF 40%; mild LCH; diff HK; Triv AR/PR, mild MR/TR; G1DD.   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 b.) TTE 09/17/2006: EF 40%. c.) Cardiac MRI 09/15/2006: EF 35-40%. d.) TTE 06/16/2007: EF 35%. e.) TTE 02/02/2008: EF 30%. f.) TTE 01/03/2009: EF 30%. g.)  TTe 07/31/2010: EF 30%. h.) Stress echo 03/19/2015: EF 45%. i.) TTE 07/02/2017: EF 35%. j.) TTE 03/13/2021: EF 40%.   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   Melanoma (HCC)    NSTEMI (non-ST elevated myocardial infarction) (HCC) 09/11/2006   Prostatitis    Squamous cell skin cancer    ST elevation myocardial infarction (STEMI) of anterior wall (HCC) 08/03/2006   a.) LHC 08/03/2006: EF 48%; 90% pRCA, 99% mLAD --> 2.0 x 28 mm Mini-Vision BMS to dLAD and 2.25 x 12 mm Mini-Vision BMS to pLAD; plan for staged PCI of RCA. b.) PCI of RCA 08/05/2006 placing a 3.0 x 18 mm Driver BMS to pRCA.   SVT (supraventricular tachycardia) (HCC)    T2DM (type 2 diabetes mellitus) (HCC) 07/08/2011   Vitamin D deficiency  Past Surgical History:  Procedure Laterality Date   BACK SURGERY     1972 ruptured disc   CARDIAC CATHETERIZATION     stents   CHOLECYSTECTOMY     COLONOSCOPY  07/20/2006   Crohn's disease   CORONARY ANGIOGRAPHY N/A 07/07/2017   Procedure: CORONARY ANGIOGRAPHY;  Surgeon: Dalia Heading, MD;  Location: [facility] INVASIVE [facility] LAB;  Service: Cardiovascular;  Laterality: N/A;   CORONARY STENT PLACEMENT     FLEXIBLE SIGMOIDOSCOPY N/A 09/25/2016   Procedure: FLEXIBLE SIGMOIDOSCOPY;  Surgeon: Wyline Mood, MD;  Location: [facility] ENDOSCOPY;  Service: Endoscopy;  Laterality: N/A;   FRACTURE SURGERY Right    wrist fracture pins have been removed 1975   LEFT HEART CATH Left 07/07/2017   Procedure: Left Heart Cath;  Surgeon: Dalia Heading, MD;  Location: [facility] INVASIVE CV LAB;  Service: Cardiovascular;  Laterality: Left;   MELANOMA REMOVED     parotid gland removal     w melanoma surgery lymph nodes removed   Family History  Problem Relation Age of Onset    Heart disease Mother    Heart attack Father    Heart disease Sister    Heart disease Brother    Social History   Tobacco Use   Smoking status: Never    Passive exposure: Never   Smokeless tobacco: Never  Vaping Use   Vaping Use: Never used  Substance Use Topics   Alcohol use: No   Drug use: No    Pertinent Clinical Results:  LABS: Labs reviewed: Acceptable for surgery.  Hospital Outpatient Visit on 12/30/2021  Component Date Value Ref Range Status   Sodium 12/30/2021 137  135 - 145 mmol/L Final   Potassium 12/30/2021 3.9  3.5 - 5.1 mmol/L Final   Chloride 12/30/2021 100  98 - 111 mmol/L Final   CO2 12/30/2021 30  22 - 32 mmol/L Final   Glucose, Bld 12/30/2021 101 (H)  70 - 99 mg/dL Final   Glucose reference range applies only to samples taken after fasting for at least 8 hours.   BUN 12/30/2021 13  8 - 23 mg/dL Final   Creatinine, Ser 12/30/2021 0.80  0.61 - 1.24 mg/dL Final   Calcium 16/07/9603 9.3  8.9 - 10.3 mg/dL Final   GFR, Estimated 12/30/2021 >60  >60 mL/min Final   Comment: (NOTE) Calculated using the CKD-EPI Creatinine Equation (2021)    Anion gap 12/30/2021 7  5 - 15 Final   Performed at [facility], [address] [place], [place] [address]   WBC 12/30/2021 6.5  4.0 - 10.5 K/uL Final   RBC 12/30/2021 4.04 (L)  4.22 - 5.81 MIL/uL Final   Hemoglobin 12/30/2021 12.9 (L)  13.0 - 17.0 g/dL Final   HCT 11/91/4782 37.1 (L)  39.0 - 52.0 % Final   MCV 12/30/2021 91.8  80.0 - 100.0 fL Final   MCH 12/30/2021 31.9  26.0 - 34.0 pg Final   MCHC 12/30/2021 34.8  30.0 - 36.0 g/dL Final   RDW 95/62/1308 14.5  11.5 - 15.5 % Final   Platelets 12/30/2021 304  150 - 400 K/uL Final   nRBC 12/30/2021 0.0  0.0 - 0.2 % Final   Performed at [facility], [address] [place], [place] [address]    ECG: Date: 12/02/2021 Time ECG obtained: 1429 PM Rate: 64 bpm Rhythm: normal sinus Axis (leads I and aVF): Normal Intervals: PR 204 ms. QRS 98 ms. QTc 410  ms. ST segment and T wave changes:  Questionable change in initial forces of the anterior leads.  Anterolateral T wave inversion.  Evidence of age undetermined anterior and inferior infarcts present.   Comparison: Previous tracing obtained on 10/06/2016 showed AJR at a rate of 86 bpm with lateral T wave inversions.  There was evidence of age undetermined inferior and anterior infarct present.  IMAGING / PROCEDURES: TRANSTHORACIC ECHOCARDIOGRAM performed on 03/13/2021 LVEF 40% Mild LVH Global hypokinesis Diastolic Doppler parameters consistent with abnormal relaxation (G1DD) Trivial AR and PR Mild MR and TR No valvular stenosis No pericardial effusion  LEFT HEART CATHETERIZATION AND CORONARY ANGIOGRAPHY performed on 07/07/2017 Moderate to severe left ventricular systolic dysfunction with an EF of 25-35% Mild mitral valve regurgitation No evidence of valvular stenosis Multivessel CAD 100% stenosis of the mid RCA 10% stenosis of the mid LAD 10% stenosis of the distal LAD Previously placed stents BMS to the RCA occluded with collaterals from the LAD BMS x 2 in the mid LAD patent Recommendations: Medical management to include beta-blockers.  Will DC lisinopril and placed on Entresto.  MYOCARDIAL PERFUSION IMAGING STUDY (LEXISCAN) performed on 06/07/2017 LVEF 40% Inferior wall hypokinesis No artifact Left ventricular cavity size normal Moderate area of inferior hypoperfusion with stress and partial redistribution with rest Study determined to be moderate to high risk and will need further evaluation  Impression and Plan:  David [name] has been referred for pre-anesthesia review and clearance prior to him undergoing the planned anesthetic and procedural courses. Available labs, pertinent testing, and imaging results were personally reviewed by me. This patient has been appropriately cleared by cardiology with an overall LOW risk of significant perioperative cardiovascular  complications.  Based on clinical review performed today (01/01/22), barring any significant acute changes in the patient's overall condition, it is anticipated that he will be able to proceed with the planned surgical intervention. Any acute changes in clinical condition may necessitate his procedure being postponed and/or cancelled. Patient will meet with anesthesia team (MD and/or CRNA) on the day of his procedure for preoperative evaluation/assessment. Questions regarding anesthetic course will be fielded at that time.   Pre-surgical instructions were reviewed with the patient during his PAT appointment and questions were fielded by PAT clinical staff. Patient was advised that if any questions or concerns arise prior to his procedure then he should return a call to PAT and/or his surgeon's office to discuss.  Quentin Mulling, MSN, APRN, FNP-C, CEN [facility]  Peri-operative Services Nurse Practitioner Phone: [phone] Fax: [phone] 01/01/22 10:12 AM  NOTE: This note has been prepared using Dragon dictation software. Despite my best ability to proofread, there is always the potential that unintentional transcriptional errors may still occur from this process.

## 2022-01-02 ENCOUNTER — Other Ambulatory Visit: Payer: Self-pay

## 2022-01-02 ENCOUNTER — Encounter
Admission: RE | Disposition: A | Discharge status: Home / Self Care | Payer: Self-pay | Source: Home / Self Care | Attending: Urology

## 2022-01-02 ENCOUNTER — Ambulatory Visit: Payer: Medicare Other | Admitting: Urgent Care

## 2022-01-02 ENCOUNTER — Encounter: Payer: Self-pay | Admitting: Urology

## 2022-01-02 ENCOUNTER — Ambulatory Visit
Admission: RE | Admit: 2022-01-02 | Discharge: 2022-01-02 | Disposition: A | Discharge status: Home / Self Care | Payer: Medicare Other | Attending: Urology | Admitting: Urology

## 2022-01-02 DIAGNOSIS — I255 Ischemic cardiomyopathy: Secondary | ICD-10-CM | POA: Diagnosis not present

## 2022-01-02 DIAGNOSIS — N475 Adhesions of prepuce and glans penis: Secondary | ICD-10-CM | POA: Insufficient documentation

## 2022-01-02 DIAGNOSIS — N4889 Other specified disorders of penis: Secondary | ICD-10-CM | POA: Diagnosis not present

## 2022-01-02 DIAGNOSIS — I471 Supraventricular tachycardia: Secondary | ICD-10-CM | POA: Insufficient documentation

## 2022-01-02 DIAGNOSIS — Z8711 Personal history of peptic ulcer disease: Secondary | ICD-10-CM | POA: Diagnosis not present

## 2022-01-02 DIAGNOSIS — D649 Anemia, unspecified: Secondary | ICD-10-CM | POA: Insufficient documentation

## 2022-01-02 DIAGNOSIS — M1990 Unspecified osteoarthritis, unspecified site: Secondary | ICD-10-CM | POA: Insufficient documentation

## 2022-01-02 DIAGNOSIS — E039 Hypothyroidism, unspecified: Secondary | ICD-10-CM | POA: Diagnosis not present

## 2022-01-02 DIAGNOSIS — I2510 Atherosclerotic heart disease of native coronary artery without angina pectoris: Secondary | ICD-10-CM | POA: Diagnosis not present

## 2022-01-02 DIAGNOSIS — E785 Hyperlipidemia, unspecified: Secondary | ICD-10-CM | POA: Diagnosis not present

## 2022-01-02 DIAGNOSIS — I110 Hypertensive heart disease with heart failure: Secondary | ICD-10-CM | POA: Insufficient documentation

## 2022-01-02 DIAGNOSIS — K219 Gastro-esophageal reflux disease without esophagitis: Secondary | ICD-10-CM | POA: Insufficient documentation

## 2022-01-02 DIAGNOSIS — E119 Type 2 diabetes mellitus without complications: Secondary | ICD-10-CM | POA: Insufficient documentation

## 2022-01-02 DIAGNOSIS — I509 Heart failure, unspecified: Secondary | ICD-10-CM | POA: Diagnosis not present

## 2022-01-02 DIAGNOSIS — I252 Old myocardial infarction: Secondary | ICD-10-CM | POA: Insufficient documentation

## 2022-01-02 HISTORY — DX: Squamous cell carcinoma of skin, unspecified: C44.92

## 2022-01-02 HISTORY — DX: Long term (current) use of antithrombotics/antiplatelets: Z79.02

## 2022-01-02 HISTORY — DX: Primary osteoarthritis, left hand: M19.041

## 2022-01-02 HISTORY — DX: Supraventricular tachycardia, unspecified: I47.10

## 2022-01-02 HISTORY — DX: Malignant melanoma of skin, unspecified: C43.9

## 2022-01-02 HISTORY — DX: Inflammatory disease of prostate, unspecified: N41.9

## 2022-01-02 HISTORY — DX: Primary osteoarthritis, right hand: M19.042

## 2022-01-02 HISTORY — DX: Presence of coronary angioplasty implant and graft: Z95.5

## 2022-01-02 HISTORY — PX: LYSIS OF ADHESION: SHX5961

## 2022-01-02 HISTORY — DX: Other long term (current) drug therapy: Z79.899

## 2022-01-02 HISTORY — DX: Supraventricular tachycardia: I47.1

## 2022-01-02 HISTORY — DX: Long term (current) use of unspecified immunomodulators and immunosuppressants: Z79.60

## 2022-01-02 HISTORY — DX: Crohn's disease, unspecified, without complications: K50.90

## 2022-01-02 HISTORY — DX: Heart failure, unspecified: I50.9

## 2022-01-02 HISTORY — DX: Male erectile dysfunction, unspecified: N52.9

## 2022-01-02 LAB — GLUCOSE, CAPILLARY
Glucose-Capillary: 64 mg/dL — ABNORMAL LOW (ref 70–99)
Glucose-Capillary: 105 mg/dL — ABNORMAL HIGH (ref 70–99)
Glucose-Capillary: 77 mg/dL (ref 70–99)

## 2022-01-02 SURGERY — LAPAROTOMY, FOR LYSIS OF ADHESIONS
Anesthesia: General

## 2022-01-02 MED ORDER — PROPOFOL 500 MG/50ML IV EMUL
INTRAVENOUS | Status: AC
  Filled 2022-01-02: qty 50

## 2022-01-02 MED ORDER — PROPOFOL 500 MG/50ML IV EMUL
INTRAVENOUS | Status: DC | PRN
  Administered 2022-01-02: 150 ug/kg/min via INTRAVENOUS

## 2022-01-02 MED ORDER — DEXTROSE 50 % IV SOLN: 12.5000 g | Freq: Once | INTRAVENOUS | Status: AC

## 2022-01-02 MED ORDER — LACTATED RINGERS IV SOLN: INTRAVENOUS | Status: DC

## 2022-01-02 MED ORDER — CHLORHEXIDINE GLUCONATE 0.12 % MT SOLN
OROMUCOSAL | Status: AC
  Administered 2022-01-02: 15 mL via OROMUCOSAL
  Filled 2022-01-02: qty 15

## 2022-01-02 MED ORDER — CHLORHEXIDINE GLUCONATE 0.12 % MT SOLN: 15.0000 mL | Freq: Once | OROMUCOSAL | Status: AC

## 2022-01-02 MED ORDER — BACITRACIN 500 UNIT/GM EX OINT
TOPICAL_OINTMENT | CUTANEOUS | Status: DC | PRN
  Administered 2022-01-02: 1 via TOPICAL

## 2022-01-02 MED ORDER — BACITRACIN ZINC 500 UNIT/GM EX OINT
TOPICAL_OINTMENT | CUTANEOUS | Status: AC
  Filled 2022-01-02: qty 28.35

## 2022-01-02 MED ORDER — SODIUM CHLORIDE 0.9 % IV SOLN: INTRAVENOUS | Status: DC

## 2022-01-02 MED ORDER — BUPIVACAINE HCL (PF) 0.25 % IJ SOLN
INTRAMUSCULAR | Status: AC
  Filled 2022-01-02: qty 30

## 2022-01-02 MED ORDER — BUPIVACAINE-EPINEPHRINE 0.5% -1:200000 IJ SOLN
INTRAMUSCULAR | Status: DC | PRN
  Administered 2022-01-02: 18 mL

## 2022-01-02 MED ORDER — BUPIVACAINE HCL (PF) 0.5 % IJ SOLN
INTRAMUSCULAR | Status: AC
  Filled 2022-01-02: qty 30

## 2022-01-02 MED ORDER — ORAL CARE MOUTH RINSE: 15.0000 mL | Freq: Once | OROMUCOSAL | Status: AC

## 2022-01-02 MED ORDER — DEXTROSE 50 % IV SOLN
INTRAVENOUS | Status: AC
  Administered 2022-01-02: 12.5 g via INTRAVENOUS
  Filled 2022-01-02: qty 50

## 2022-01-02 MED ORDER — 0.9 % SODIUM CHLORIDE (POUR BTL) OPTIME
TOPICAL | Status: DC | PRN
  Administered 2022-01-02: 100 mL

## 2022-01-02 SURGICAL SUPPLY — 29 items
BLADE SURG 15 STRL LF DISP TIS (BLADE) ×1 IMPLANT
BLADE SURG 15 STRL SS (BLADE) ×2
BNDG CONFORM 2 STRL LF (GAUZE/BANDAGES/DRESSINGS) ×2 IMPLANT
DRAPE LAPAROTOMY 77X122 PED (DRAPES) ×2 IMPLANT
ELECT CAUTERY BLADE 6.4 (BLADE) ×1 IMPLANT
ELECT REM PT RETURN 9FT ADLT (ELECTROSURGICAL) ×2
ELECTRODE REM PT RTRN 9FT ADLT (ELECTROSURGICAL) ×1 IMPLANT
GAUZE 4X4 16PLY ~~LOC~~+RFID DBL (SPONGE) ×2 IMPLANT
GAUZE PETROLATUM 1 X8 (GAUZE/BANDAGES/DRESSINGS) ×2 IMPLANT
GLOVE SURG UNDER POLY LF SZ7.5 (GLOVE) ×4 IMPLANT
GOWN STRL REUS W/ TWL LRG LVL3 (GOWN DISPOSABLE) ×1 IMPLANT
GOWN STRL REUS W/ TWL XL LVL3 (GOWN DISPOSABLE) ×1 IMPLANT
GOWN STRL REUS W/TWL LRG LVL3 (GOWN DISPOSABLE) ×2
GOWN STRL REUS W/TWL XL LVL3 (GOWN DISPOSABLE) ×2
KIT TURNOVER KIT A (KITS) ×2 IMPLANT
MANIFOLD NEPTUNE II (INSTRUMENTS) ×2 IMPLANT
NDL HYPO 25X1 1.5 SAFETY (NEEDLE) ×1 IMPLANT
NEEDLE HYPO 25X1 1.5 SAFETY (NEEDLE) ×2 IMPLANT
NS IRRIG 500ML POUR BTL (IV SOLUTION) ×2 IMPLANT
PACK CYSTO (CUSTOM PROCEDURE TRAY) ×1 IMPLANT
PENCIL ELECTRO HAND CTR (MISCELLANEOUS) ×1 IMPLANT
SOL PREP PVP 2OZ (MISCELLANEOUS) ×2
SOLUTION PREP PVP 2OZ (MISCELLANEOUS) ×1 IMPLANT
SURGILUBE 2OZ TUBE FLIPTOP (MISCELLANEOUS) ×2 IMPLANT
SUT CHROMIC 3 0 SH 27 (SUTURE) ×1 IMPLANT
SUT VIC AB 3-0 SH 27 (SUTURE)
SUT VIC AB 3-0 SH 27X BRD (SUTURE) ×1 IMPLANT
SYR 10ML LL (SYRINGE) ×2 IMPLANT
WATER STERILE IRR 500ML POUR (IV SOLUTION) ×2 IMPLANT

## 2022-01-02 NOTE — Anesthesia Preprocedure Evaluation (Addendum)
Anesthesia Evaluation  ?Patient identified by MRN, date of birth, ID band ?Patient awake ? ?General Assessment Comment: ? 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 Neck extension intact. Mouth opening slightly limited ? ?Reviewed: ?Allergy & Precautions, NPO status , Patient's Chart, lab work & pertinent test results ? ?History of Anesthesia Complications ?Negative for: history of anesthetic complications ? ?Airway ?Mallampati: III ? ?TM Distance: <3 FB ?Neck ROM: full ? ?Mouth opening: Limited Mouth Opening ? Dental ? ?(+) Chipped ?  ?Pulmonary ?neg pulmonary ROS, neg shortness of breath,  ?  ?Pulmonary exam normal ? ? ? ? ? ? ? Cardiovascular ?Exercise Tolerance: Good ?hypertension, + CAD, + Past MI, + Cardiac Stents and +CHF  ?Normal cardiovascular exam ? ? ?  ?Neuro/Psych ? Neuromuscular disease negative psych ROS  ? GI/Hepatic ?Neg liver ROS, PUD, GERD  Controlled,  ?Endo/Other  ?diabetes, Type 2 ? Renal/GU ?  ? ?  ?Musculoskeletal ? ?(+) Arthritis ,  ? Abdominal ?  ?Peds ? Hematology ?negative hematology ROS ?(+)   ?Anesthesia Other Findings ?Past Medical History: ?No date: Anemia ?No date: Arthritis of both hands ?08/03/2006: CAD (coronary artery disease) ?    Comment:  a.) Anterior STEMI 08/03/2006 --> LHC EF 48%; 90% pRCA,  ?             99% mLAD --> PCI placing 2.0 x 28 mm Mini-Vision BMS to  ?             dLAD and 2.25 x 12 mm Mini-Vision BMS to pLAD; plans for  ?             staged PCI of RCA. b.) Staged PCI 10/25/20017: 2.0 x 18  ?             mm Driver BMS to pRCA. c.) NSTEMI 09/2006. d.) LHC  ?             07/07/2017: EF 25-35%; 100% mRCA; patent BMS x 2 to LAD;  ?             BMS to the RCA totally occ; not amenable to further  ?             intervention. ?No date: CHF (congestive heart failure) ([place]) ?    Comment:  a.) TTE 09/17/2006: EF 40%; LV mural thrombus; mild LVH; ?             apical AK, inf HK; triv MR/TR/PR; G1DD. b.) TTE   ?             06/16/2007: EF 35%; diff HK; apical AK; mild LA dil, triv ?             AR/TR/PR, mild MR. c.) TTE 07/31/2010: EF 30%; septal HK, ?             apical/inf/post AK; triv AR/MR; G1DD. d.) TTE 07/02/2017: ?             EF 35%; mild LVH; diff HK; panval regurg. e.) TTE  ?             03/13/2021: EF 40%; mild LCH; diff HK; Triv AR/PR, mild  ?             MR/TR; G1DD. ?03/07/2015: 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  ?(HCC) ?    Comment:  a.) on vedolizumab ?07/08/2011: Colitis due to Clostridioides difficile ?No date: Crohn's disease ([place]) ?No date: Erectile dysfunction ?    Comment:  a.)  on PDE5i (tadalafil) ?07/08/2011: GERD (gastroesophageal reflux disease) ?No date: History of heart artery stent ?    Comment:  a.) TOTAL # stents (as of 01/01/2022): 3 --> 2.0 x 28 mm ?             mini Vision BMS to the distal LAD, 2.25 x 12 mm mini  ?             Vision BMS to the proximal LAD (08/03/2006); 3.0 x 18 mm  ?             Driver BMS to the proximal RCA (08/05/2006) ?07/08/2011: Hyperlipidemia, unspecified ?02/15/2015: Hypothyroidism due to acquired atrophy of thyroid ?10/31/2016: Inferior mesenteric vein thrombosis ([place]) ?08/03/2006: Ischemic cardiomyopathy ?    Comment:  a.) LHC 08/03/2006: EF 48%. b.) TTE 09/17/2006: EF 40%.  ?             c.) Cardiac MRI 09/15/2006: EF 35-40%. d.) TTE  ?             06/16/2007: EF 35%. e.) TTE 02/02/2008: EF 30%. f.) TTE  ?             01/03/2009: EF 30%. g.) TTe 07/31/2010: EF 30%. h.)  ?             Stress echo 03/19/2015: EF 45%. i.) TTE 07/02/2017: EF  ?             35%. j.) TTE 03/13/2021: EF 40%. ?09/13/2006: Left ventricular apical thrombus ?    Comment:  a.) Noted in setting of NSTEMI; TTE with EF 40% ?No date: Long term current use of antithrombotics/antiplatelets ?    Comment:  a.) DAPT therapy (ASA + clopidogrel) ?No date: Long term current use of immunosuppressive drug ?    Comment:  a.) on vedolizumab for UC diagnosis. ?No date: Melanoma  ([place]) ?09/11/2006: NSTEMI (non-ST elevated myocardial infarction) ([place]) ?No date: Prostatitis ?No date: Squamous cell skin cancer ?08/03/2006: ST elevation myocardial infarction (STEMI) of anterior  ?wall ([place]) ?    Comment:  a.) LHC 08/03/2006: EF 48%; 90% pRCA, 99% mLAD --> 2.0 x ?             28 mm Mini-Vision BMS to dLAD and 2.25 x 12 mm  ?             Mini-Vision BMS to pLAD; plan for staged PCI of RCA. b.)  ?             PCI of RCA 08/05/2006 placing a 3.0 x 18 mm Driver BMS to ?             pRCA. ?No date: SVT (supraventricular tachycardia) ([place]) ?07/08/2011: T2DM (type 2 diabetes mellitus) ([place]) ?No date: Vitamin D deficiency ? ?Past Surgical History: ?No date: BACK SURGERY ?    Comment:  1972 ruptured disc ?No date: CARDIAC CATHETERIZATION ?    Comment:  stents ?No date: CHOLECYSTECTOMY ?07/20/2006: COLONOSCOPY ?    Comment:  Crohn's disease ?07/07/2017: CORONARY ANGIOGRAPHY; N/A ?    Comment:  Procedure: CORONARY ANGIOGRAPHY;  Surgeon: Bartholome Bill ?             A, MD;  Location: [place] CV LAB;  Service:  ?             Cardiovascular;  Laterality: N/A; ?No date: CORONARY STENT PLACEMENT ?09/25/2016: FLEXIBLE SIGMOIDOSCOPY; N/A ?    Comment:  Procedure: FLEXIBLE SIGMOIDOSCOPY;  Surgeon: [name], ?  MD;  Location: [facility] ENDOSCOPY;  Service: Endoscopy;   ?             Laterality: N/A; ?No date: FRACTURE SURGERY; Right ?    Comment:  wrist fracture pins have been removed 1975 ?07/07/2017: LEFT HEART CATH; Left ?    Comment:  Procedure: Left Heart Cath;  Surgeon: Teodoro Spray,  ?             MD;  Location: [place] CV LAB;  Service:  ?             Cardiovascular;  Laterality: Left; ?No date: MELANOMA REMOVED ?No date: parotid gland removal ?    Comment:  w melanoma surgery lymph nodes removed ? ?BMI   ? Body Mass Index: 22.37 kg/m?  ?  ? ? Reproductive/Obstetrics ?negative OB ROS ? ?  ? ? ? ? ? ? ? ? ? ? ? ? ? ?  ?  ? ? ? ? ? ? ? ?Anesthesia Physical ?Anesthesia Plan ? ?ASA:  3 ? ?Anesthesia Plan: General  ? ?Post-op Pain Management: Ofirmev IV (intra-op)*  ? ?Induction: Intravenous ? ?PONV Risk Score and Plan: 2 and Dexamethasone, Ondansetron, Midazolam and Treatment may vary due to age or medical condition ? ?Airway Management Planned: Natural Airway and Nasal Cannula ? ?Additional Equipment: None ? ?Intra-op Plan:  ? ?Post-operative Plan:  ? ?Informed Consent: I have reviewed the patients History and Physical, chart, labs and discussed the procedure including the risks, benefits and alternatives for the proposed anesthesia with the patient or authorized representative who has indicated his/her understanding and acceptance.  ? ? ? ?Dental Advisory Given ? ?Plan Discussed with: Anesthesiologist, CRNA and Surgeon ? ?Anesthesia Plan Comments: (Discussed risks of anesthesia with patient, including possibility of difficulty with spontaneous ventilation under anesthesia necessitating airway intervention, PONV, and rare risks such as cardiac or respiratory or neurological events, and allergic reactions. Discussed the role of CRNA in patient's perioperative care. Patient understands.)  ? ? ? ? ? ?Anesthesia Quick Evaluation ? ?

## 2022-01-02 NOTE — Discharge Instructions (Signed)
AMBULATORY SURGERY  ?DISCHARGE INSTRUCTIONS ? ? ?The drugs that you were given will stay in your system until tomorrow so for the next 24 hours you should not: ? ?Drive an automobile ?Make any legal decisions ?Drink any alcoholic beverage ? ? ?You may resume regular meals tomorrow.  Today it is better to start with liquids and gradually work up to solid foods. ? ?You may eat anything you prefer, but it is better to start with liquids, then soup and crackers, and gradually work up to solid foods. ? ? ?Please notify your doctor immediately if you have any unusual bleeding, trouble breathing, redness and pain at the surgery site, drainage, fever, or pain not relieved by medication. ?  ? ?Additional Instructions:  ?

## 2022-01-02 NOTE — Anesthesia Procedure Notes (Signed)
Date/Time: 01/02/2022 4:58 PM ?Performed by: Nelda Marseille, CRNA ?Pre-anesthesia Checklist: Patient identified, Emergency Drugs available, Suction available, Patient being monitored and Timeout performed ?Oxygen Delivery Method: Simple face mask ? ? ? ? ?

## 2022-01-02 NOTE — H&P (Signed)
? ?01/02/22 ?3:50 PM  ? ?Melanee Left ?1946-01-25 ?[id number] ? ?CC: Penile adhesions ? ?HPI: ?76 year old uncircumcised male with a few years of dense circumferential penile adhesions that are increasingly bothersome, and cause pain with sexual activity and erections.  We have tried numerous creams and lotions with minimal improvement, and he opted for proceeding to the OR for takedown of circumferential adhesions.  He does not want to pursue circumcision at this time. ? ? ?PMH: ?Past Medical History:  ?Diagnosis Date  ? Anemia   ? Arthritis of both hands   ? CAD (coronary artery disease) 08/03/2006  ? a.) Anterior STEMI 08/03/2006 --> LHC EF 48%; 90% pRCA, 99% mLAD --> PCI placing 2.0 x 28 mm Mini-Vision BMS to dLAD and 2.25 x 12 mm Mini-Vision BMS to pLAD; plans for staged PCI of RCA. b.) Staged PCI 10/25/20017: 2.0 x 18 mm Driver BMS to pRCA. c.) NSTEMI 09/2006. d.) LHC 07/07/2017: EF 25-35%; 100% mRCA; patent BMS x 2 to LAD; BMS to the RCA totally occ; not amenable to further intervention.  ? CHF (congestive heart failure) ([place])   ? a.) TTE 09/17/2006: EF 40%; LV mural thrombus; mild LVH; apical AK, inf HK; triv MR/TR/PR; G1DD. b.) TTE 06/16/2007: EF 35%; diff HK; apical AK; mild LA dil, triv AR/TR/PR, mild MR. c.) TTE 07/31/2010: EF 30%; septal HK, apical/inf/post AK; triv AR/MR; G1DD. d.) TTE 07/02/2017: EF 35%; mild LVH; diff HK; panval regurg. e.) TTE 03/13/2021: EF 40%; mild LCH; diff HK; Triv AR/PR, mild MR/TR; G1DD.  ? 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 a.) on vedolizumab  ? Colitis due to Clostridioides difficile 07/08/2011  ? Crohn's disease ([place])   ? Erectile dysfunction   ? a.) on PDE5i (tadalafil)  ? GERD (gastroesophageal reflux disease) 07/08/2011  ? History of heart artery stent   ? a.) TOTAL # stents (as of 01/01/2022): 3 --> 2.0 x 28 mm mini Vision BMS to the distal LAD, 2.25 x 12 mm mini Vision BMS to the proximal LAD (08/03/2006); 3.0 x 18 mm Driver  BMS to the proximal RCA (08/05/2006)  ? Hyperlipidemia, unspecified 07/08/2011  ? Hypothyroidism due to acquired atrophy of thyroid 02/15/2015  ? 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 Ischemic cardiomyopathy 08/03/2006  ? a.) LHC 08/03/2006: EF 48%. b.) TTE 09/17/2006: EF 40%. c.) Cardiac MRI 09/15/2006: EF 35-40%. d.) TTE 06/16/2007: EF 35%. e.) TTE 02/02/2008: EF 30%. f.) TTE 01/03/2009: EF 30%. g.) TTe 07/31/2010: EF 30%. h.) Stress echo 03/19/2015: EF 45%. i.) TTE 07/02/2017: EF 35%. j.) TTE 03/13/2021: EF 40%.  ? Left ventricular apical thrombus 09/13/2006  ? a.) Noted in setting of NSTEMI; TTE with EF 40%  ? Long term current use of antithrombotics/antiplatelets   ? a.) DAPT therapy (ASA + clopidogrel)  ? Long term current use of immunosuppressive drug   ? a.) on vedolizumab for UC diagnosis.  ? Melanoma ([place])   ? NSTEMI (non-ST elevated myocardial infarction) ([place]) 09/11/2006  ? Prostatitis   ? Squamous cell skin cancer   ? ST elevation myocardial infarction (STEMI) of anterior wall ([place]) 08/03/2006  ? a.) LHC 08/03/2006: EF 48%; 90% pRCA, 99% mLAD --> 2.0 x 28 mm Mini-Vision BMS to dLAD and 2.25 x 12 mm Mini-Vision BMS to pLAD; plan for staged PCI of RCA. b.) PCI of RCA 08/05/2006 placing a 3.0 x 18 mm Driver BMS to pRCA.  ? SVT (supraventricular tachycardia) ([place])   ? T2DM (type 2 diabetes mellitus) ([place])  07/08/2011  ? Vitamin D deficiency   ? ? ?Surgical History: ?Past Surgical History:  ?Procedure Laterality Date  ? BACK SURGERY    ? 1972 ruptured disc  ? CARDIAC CATHETERIZATION    ? stents  ? CHOLECYSTECTOMY    ? COLONOSCOPY  07/20/2006  ? Crohn's disease  ? CORONARY ANGIOGRAPHY N/A 07/07/2017  ? Procedure: CORONARY ANGIOGRAPHY;  Surgeon: Teodoro Spray, MD;  Location: [facility];  Service: Cardiovascular;  Laterality: N/A;  ? CORONARY STENT PLACEMENT    ? FLEXIBLE SIGMOIDOSCOPY N/A 09/25/2016  ? Procedure: FLEXIBLE SIGMOIDOSCOPY;  Surgeon: [name], MD;  Location: [facility]  ENDOSCOPY;  Service: Endoscopy;  Laterality: N/A;  ? FRACTURE SURGERY Right   ? wrist fracture pins have been removed 1975  ? LEFT HEART CATH Left 07/07/2017  ? Procedure: Left Heart Cath;  Surgeon: Teodoro Spray, MD;  Location: [facility];  Service: Cardiovascular;  Laterality: Left;  ? MELANOMA REMOVED    ? parotid gland removal    ? w melanoma surgery lymph nodes removed  ? ? ?Family History: ?Family History  ?Problem Relation Age of Onset  ? Heart disease Mother   ? Heart attack Father   ? Heart disease Sister   ? Heart disease Brother   ? ? ?Social History:  reports that he has never smoked. He has never been exposed to tobacco smoke. He has never used smokeless tobacco. He reports that he does not drink alcohol and does not use drugs. ? ?Physical Exam: ?BP (!) 154/80   Temp 98.4 ?F (36.9 ?C) (Oral)   Resp 16   Ht 6' (1.829 m)   Wt 74.8 kg   SpO2 99%   BMI 22.37 kg/m?   ? ?Constitutional:  Alert and oriented, No acute distress. ?Cardiovascular: Regular rate and rhythm ?Respiratory: Clear to auscultation bilaterally ?GI: Abdomen is soft, nontender, nondistended, no abdominal masses ? ?Assessment & Plan:   ?76 year old uncircumcised male with dense circumferential penile adhesions that are increasingly painful with erections and any sexual activity.  We tried numerous creams with no improvement, and he opted for takedown of adhesions under sedation.  He does not desire circumcision at this time. ? ?Nickolas Madrid, MD ?01/02/2022 ? ?[facility] ?[address] ?[place], [place] [address] ?[phone] ? ? ?

## 2022-01-02 NOTE — Progress Notes (Addendum)
Patient blood sugar is 77. Notified Dr. Bertell Maria- no new orders, recommends to give a regular drink instead of diet once more alert ?

## 2022-01-02 NOTE — Transfer of Care (Signed)
Immediate Anesthesia Transfer of Care Note ? ?Patient: David [name] ? ?Procedure(s) Performed: LYSIS OF PENILE ADHESIONS ? ?Patient Location: PACU ? ?Anesthesia Type:General ? ?Level of Consciousness: awake and sedated ? ?Airway & Oxygen Therapy: Patient Spontanous Breathing and Patient connected to face mask oxygen ? ?Post-op Assessment: Report given to RN and Post -op Vital signs reviewed and stable ? ?Post vital signs: Reviewed and stable ? ?Last Vitals:  ?Vitals Value Taken Time  ?BP 107/66 01/02/22 1708  ?Temp 37.3 ?C 01/02/22 1708  ?Pulse 61 01/02/22 1710  ?Resp 20 01/02/22 1710  ?SpO2 100 % 01/02/22 1710  ?Vitals shown include unvalidated device data. ? ?Last Pain:  ?Vitals:  ? 01/02/22 1425  ?TempSrc: Oral  ?PainSc: 0-No pain  ?   ? ?  ? ?Complications: No notable events documented. ?

## 2022-01-02 NOTE — Anesthesia Postprocedure Evaluation (Signed)
Anesthesia Post Note ? ?Patient: EDD REPPERT ? ?Procedure(s) Performed: LYSIS OF PENILE ADHESIONS ? ?Patient location during evaluation: PACU ?Anesthesia Type: General ?Level of consciousness: awake and alert ?Pain management: pain level controlled ?Vital Signs Assessment: post-procedure vital signs reviewed and stable ?Respiratory status: spontaneous breathing, nonlabored ventilation, respiratory function stable and patient connected to nasal cannula oxygen ?Cardiovascular status: blood pressure returned to baseline and stable ?Postop Assessment: no apparent nausea or vomiting ?Anesthetic complications: no ? ? ?No notable events documented. ? ? ?Last Vitals:  ?Vitals:  ? 01/02/22 1730 01/02/22 1740  ?BP: 139/75 (!) 148/79  ?Pulse: 66 68  ?Resp: 17 14  ?Temp: 36.7 ?C 36.4 ?C  ?SpO2: 97% 100%  ?  ?Last Pain:  ?Vitals:  ? 01/02/22 1740  ?TempSrc: Temporal  ?PainSc: 0-No pain  ? ? ?  ?  ?  ?  ?  ?  ? ?Arita Miss ? ? ? ? ?

## 2022-01-02 NOTE — Op Note (Signed)
Date of procedure: 01/02/22 ? ?Preoperative diagnosis:  ?Penile adhesions ? ?Postoperative diagnosis:  ?Same ? ?Procedure: ?Takedown of circumferential penile adhesions ? ?Surgeon: Nickolas Madrid, MD ? ?Anesthesia: General ? ?Complications: None ? ?Intraoperative findings:  ?Circumferential penile adhesions taken down, bacitracin applied.  No significant bleeding noted ? ?EBL: Minimal ? ?Specimens: None ? ?Drains: None ? ?Indication: David [name] is a 76 y.o. patient with dense circumferential penile adhesions who has failed multiple trials of creams/lotions and opted for takedown under sedation in the OR, as he did not tolerate this in clinic.  He does not desire circumcision at this time.  After reviewing the management options for treatment, they elected to proceed with the above surgical procedure(s). We have discussed the potential benefits and risks of the procedure, side effects of the proposed treatment, the likelihood of the patient achieving the goals of the procedure, and any potential problems that might occur during the procedure or recuperation. Informed consent has been obtained. ? ?Description of procedure: ? ?The patient was taken to the operating room and general anesthesia was induced. SCDs were placed for DVT prophylaxis. The patient was placed in the supine position. A preoperative time-out was performed.  ? ?On exam, there were dense circumferential penile adhesions up to the midpoint of the glans.  A penile and ring block were performed with 1% lidocaine without epinephrine.  Using gauze, the adhesions were carefully taken down circumferentially until the entire glans was visualized.  There was minimal bleeding and this was spot cauterized.  Bacitracin was liberally applied circumferentially, and the foreskin was able to be reduced easily, and pull all the way back.  Foreskin was reduced at the conclusion of the case. ? ?Disposition: Stable to PACU ? ?Plan: ?Patient should apply bacitracin  3-4 times daily to prevent re-adhesion, follow-up in clinic in 4 to 8 weeks for wound check ? ?Nickolas Madrid, MD ? ?

## 2022-01-03 ENCOUNTER — Encounter: Payer: Self-pay | Admitting: Urology

## 2022-01-06 ENCOUNTER — Ambulatory Visit: Payer: Medicare Other | Admitting: Urology

## 2022-01-06 ENCOUNTER — Other Ambulatory Visit: Payer: Self-pay

## 2022-01-06 ENCOUNTER — Encounter: Payer: Self-pay | Admitting: Urology

## 2022-01-06 DIAGNOSIS — N475 Adhesions of prepuce and glans penis: Secondary | ICD-10-CM | POA: Diagnosis not present

## 2022-01-06 NOTE — Progress Notes (Signed)
? ?  01/06/2022 ?1:19 PM  ? ?Melanee Left ?[birth year] ?[id number] ? ?Reason for visit: Follow up wound check ? ?HPI: ?76 year old male who underwent takedown of dense circumferential penile adhesions on Friday, 12/29/2021.  He was instructed to apply bacitracin 2-3 times daily and continue to retract the foreskin to prevent repeat adhesions.  He had been doing well until starting Plavix Sunday night and noticed some bleeding and made a follow-up appointment. ? ?On exam today, he has no active bleeding from the penis, and things seem to be healing well.  There are some raw areas of skin with some irritation which likely caused some minor bleeding when he started the Plavix.   ? ?We reviewed the risks and benefits of blood thinners in the postoperative period, and he would like to continue the Plavix which is reasonable.  I have very low suspicion for significant bleeding from his small superficial healing wound. ? ?Reassurance provided, continue to apply bacitracin 2-3 times daily and retract the foreskin to prevent re- adhesion. ? ?Keep scheduled follow-up in 6 weeks for wound check ? ?Billey Co, MD ? ?[facility] ?[address], [address] ?[place], [place] [address] ?[phone] ? ? ?

## 2022-01-07 ENCOUNTER — Other Ambulatory Visit: Payer: Medicare Other

## 2022-01-07 ENCOUNTER — Ambulatory Visit: Payer: Medicare Other

## 2022-01-07 ENCOUNTER — Ambulatory Visit: Payer: Medicare Other | Admitting: Oncology

## 2022-01-12 ENCOUNTER — Ambulatory Visit: Payer: Medicare Other | Admitting: Gastroenterology

## 2022-01-13 ENCOUNTER — Other Ambulatory Visit: Payer: Self-pay | Admitting: *Deleted

## 2022-01-14 ENCOUNTER — Ambulatory Visit
Admission: RE | Admit: 2022-01-14 | Discharge: 2022-01-14 | Disposition: A | Discharge status: Home / Self Care | Payer: Medicare Other | Source: Ambulatory Visit | Attending: Gastroenterology | Admitting: Gastroenterology

## 2022-01-14 DIAGNOSIS — K5100 Ulcerative (chronic) pancolitis without complications: Secondary | ICD-10-CM | POA: Insufficient documentation

## 2022-01-14 MED ORDER — SODIUM CHLORIDE FLUSH 0.9 % IV SOLN
INTRAVENOUS | Status: AC
  Filled 2022-01-14: qty 10

## 2022-01-14 MED ORDER — VEDOLIZUMAB 300 MG IV SOLR
300.0000 mg | Freq: Once | INTRAVENOUS | Status: AC
  Administered 2022-01-14: 300 mg via INTRAVENOUS
  Filled 2022-01-14: qty 5

## 2022-01-14 NOTE — Progress Notes (Signed)
Pt received Entyvio infusion per MD's order. Pt tolerated well. VS WNL. No c/o pain nor respiratory issues noted. Pt verbalized of fully understanding of infusion and side effects. He was also encouraged if concerns come up in the near future to call the Dr's office. IV discontinued without difficulties. Continue to monitor. ?

## 2022-03-11 ENCOUNTER — Ambulatory Visit: Payer: Medicare Other | Admitting: Urology

## 2022-03-11 VITALS — BP 138/84 | HR 74 | Ht 71.0 in | Wt 165.0 lb

## 2022-03-11 DIAGNOSIS — N529 Male erectile dysfunction, unspecified: Secondary | ICD-10-CM | POA: Diagnosis not present

## 2022-03-11 DIAGNOSIS — N138 Other obstructive and reflux uropathy: Secondary | ICD-10-CM

## 2022-03-11 DIAGNOSIS — N401 Enlarged prostate with lower urinary tract symptoms: Secondary | ICD-10-CM

## 2022-03-11 DIAGNOSIS — N475 Adhesions of prepuce and glans penis: Secondary | ICD-10-CM

## 2022-03-11 NOTE — Progress Notes (Signed)
   03/11/2022 2:10 PM   EAGAN SHIFFLETT 1946-01-26 [id number]  Reason for visit: Follow up penile adhesions, BPH, ED  HPI: 76 year old male who had significant phimosis with dense circumferential penile adhesions, and underwent takedown of these adhesions with sedation on 01/02/2022.  He has been applying some bacitracin a couple times a day since then.  He feels like the foreskin has started to stick to the glans again, and he is having some pain with erections.  On exam, he has some very minimal adhesions circumferentially at the coronal ridge, and I was able to take these down in clinic gently, and lubrication was applied.  There was minimal bleeding.  I instructed him to continue to reduce the foreskin at least 4-5 times per day and apply lubrication to prevent re-adhesion of the foreskin during the healing process.  We also discussed circumcision as a more invasive but definitive option for his ongoing problems with pain with erections secondary to penile adhesions.  He would like to hold off at this time.  Regarding his urinary symptoms, he stopped the Flomax because he did not like the way it made him feel, but he started over-the-counter beta prostate which has been helpful.  The Cialis is working for the erections, but he still having some pain with a full erection secondary to these residual adhesions.  RTC 2 months symptom check, consider circumcision if persistent  Billey Co, [facility] [address] [place], [place] [address] [phone]

## 2022-03-11 NOTE — Patient Instructions (Signed)
Continue to pull back the foreskin all the way at least 4-5 times per day and apply any kind of lubricant like K-Y jelly to prevent the foreskin from re-adhering to the glans.  If you are having persistent problems with adhesions and pain with erections, we may need to consider circumcision in the future for more definitive treatment.  Circumcision, Adult  Circumcision is a surgery to remove or cut the fold of skin that covers the head of the penis (foreskin). When the foreskin is cut but not removed, the procedure is called a dorsal incision or dorsal circumcision. A dorsal circumcision leaves the entire foreskin but makes the end of the foreskin looser so it can be pulled back over the head of the penis. Tell a health care provider about: Any allergies you have. All medicines you are taking, including vitamins, herbs, eye drops, creams, and over-the-counter medicines. Any problems you or family members have had with anesthetic medicines. Any bleeding problems you have. Any surgeries you have had. Any medical conditions you have, including the common cold or another infection. What are the risks? Generally, this is a safe procedure. However, problems may occur, including: Bleeding. Pain. Infection. Allergic reactions to medicines. Opening of the surgical wound. This can occur from an unwanted erection after surgery. What happens before the procedure? When to stop eating and drinking Follow instructions from your health care provider about what you may eat and drink before your procedure. These may include: 8 hours before your procedure Stop eating most foods. Do not eat meat, fried foods, or fatty foods. Eat only light foods, such as toast or crackers. All liquids are okay except energy drinks and alcohol. 6 hours before your procedure Stop eating. Drink only clear liquids, such as water, clear fruit juice, black coffee, plain tea, and sports drinks. Do not drink energy drinks or  alcohol. 2 hours before your procedure Stop drinking all liquids. You may be allowed to take medicines with small sips of water. If you do not follow your health care provider's instructions, your procedure may be delayed or canceled. Medicines Ask your health care provider about: Changing or stopping your regular medicines. This is especially important if you are taking diabetes medicines or blood thinners. Taking medicines such as aspirin and ibuprofen. These medicines can thin your blood. Do not take these medicines unless your health care provider tells you to take them. Taking over-the-counter medicines, vitamins, herbs, and supplements. General instructions  Do not use any products that contain nicotine or tobacco for at least 4 weeks before the procedure. These products include cigarettes, chewing tobacco, and vaping devices, such as e-cigarettes. If you need help quitting, ask your health care provider. If you will be going home right after the procedure, plan to have a responsible adult: Take you home from the hospital or clinic. You will not be allowed to drive. Care for you for the time you are told. Ask your health care provider: How your surgery site will be marked. What steps will be taken to help prevent infection. These may include: Washing skin with a germ-killing soap. Taking antibiotic medicine. What happens during the procedure? An IV may be inserted into one of your veins. You will be given one or more of the following: A medicine to help you relax (sedative). A medicine to numb the area (local anesthetic). This will be injected with a needle into the skin of your penis. An incision will be made to remove or cut the foreskin. Absorbable  stitches (sutures) may be used to close the incision. A bandage (dressing) will be applied to the incision site. The IV will be removed. The procedure may vary among health care providers and hospitals. What happens after the  procedure? Your blood pressure, heart rate, breathing rate, and blood oxygen level will be monitored until you leave the hospital or clinic. Do not get out of bed until your health care provider approves. If you were given a sedative during the procedure, it can affect you for several hours. Do not drive or operate machinery until your health care provider says that it is safe. Summary Circumcision is a surgery to remove or cut the fold of skin that covers the head of the penis. Follow instructions from your health care provider about eating and drinking before your procedure. If you will be going home right after the procedure, plan to have a responsible adult take you home and care for you for the time you are told. This information is not intended to replace advice given to you by your health care provider. Make sure you discuss any questions you have with your health care provider. Document Revised: 09/29/2021 Document Reviewed: 09/29/2021 Elsevier Patient Education  [place].

## 2022-03-12 ENCOUNTER — Ambulatory Visit
Admission: RE | Admit: 2022-03-12 | Discharge: 2022-03-12 | Disposition: A | Discharge status: Home / Self Care | Payer: Medicare Other | Source: Ambulatory Visit | Attending: Gastroenterology | Admitting: Gastroenterology

## 2022-03-12 DIAGNOSIS — K5100 Ulcerative (chronic) pancolitis without complications: Secondary | ICD-10-CM | POA: Insufficient documentation

## 2022-03-12 MED ORDER — VEDOLIZUMAB 300 MG IV SOLR
300.0000 mg | Freq: Once | INTRAVENOUS | Status: AC
  Administered 2022-03-12: 300 mg via INTRAVENOUS
  Filled 2022-03-12: qty 5

## 2022-03-12 MED ORDER — SODIUM CHLORIDE FLUSH 0.9 % IV SOLN
INTRAVENOUS | Status: AC
  Filled 2022-03-12: qty 10

## 2022-03-15 ENCOUNTER — Other Ambulatory Visit: Payer: Self-pay | Admitting: Urology

## 2022-03-19 ENCOUNTER — Observation Stay
Admission: EM | Admit: 2022-03-19 | Discharge: 2022-03-20 | Disposition: A | Discharge status: Home / Self Care | Payer: Medicare Other | Attending: Internal Medicine | Admitting: Internal Medicine

## 2022-03-19 ENCOUNTER — Emergency Department: Payer: Medicare Other

## 2022-03-19 ENCOUNTER — Other Ambulatory Visit: Payer: Self-pay

## 2022-03-19 DIAGNOSIS — Z85828 Personal history of other malignant neoplasm of skin: Secondary | ICD-10-CM | POA: Insufficient documentation

## 2022-03-19 DIAGNOSIS — Z79899 Other long term (current) drug therapy: Secondary | ICD-10-CM | POA: Insufficient documentation

## 2022-03-19 DIAGNOSIS — I10 Essential (primary) hypertension: Secondary | ICD-10-CM | POA: Diagnosis present

## 2022-03-19 DIAGNOSIS — I639 Cerebral infarction, unspecified: Secondary | ICD-10-CM | POA: Diagnosis not present

## 2022-03-19 DIAGNOSIS — I110 Hypertensive heart disease with heart failure: Secondary | ICD-10-CM | POA: Diagnosis not present

## 2022-03-19 DIAGNOSIS — Z7982 Long term (current) use of aspirin: Secondary | ICD-10-CM | POA: Insufficient documentation

## 2022-03-19 DIAGNOSIS — E119 Type 2 diabetes mellitus without complications: Secondary | ICD-10-CM | POA: Insufficient documentation

## 2022-03-19 DIAGNOSIS — I5022 Chronic systolic (congestive) heart failure: Secondary | ICD-10-CM | POA: Diagnosis not present

## 2022-03-19 DIAGNOSIS — I2510 Atherosclerotic heart disease of native coronary artery without angina pectoris: Secondary | ICD-10-CM | POA: Diagnosis not present

## 2022-03-19 DIAGNOSIS — G5600 Carpal tunnel syndrome, unspecified upper limb: Secondary | ICD-10-CM | POA: Diagnosis not present

## 2022-03-19 DIAGNOSIS — R200 Anesthesia of skin: Secondary | ICD-10-CM | POA: Diagnosis present

## 2022-03-19 DIAGNOSIS — Z955 Presence of coronary angioplasty implant and graft: Secondary | ICD-10-CM | POA: Insufficient documentation

## 2022-03-19 DIAGNOSIS — Z7902 Long term (current) use of antithrombotics/antiplatelets: Secondary | ICD-10-CM | POA: Diagnosis not present

## 2022-03-19 DIAGNOSIS — I255 Ischemic cardiomyopathy: Secondary | ICD-10-CM | POA: Diagnosis present

## 2022-03-19 DIAGNOSIS — E039 Hypothyroidism, unspecified: Secondary | ICD-10-CM | POA: Insufficient documentation

## 2022-03-19 DIAGNOSIS — K5190 Ulcerative colitis, unspecified, without complications: Secondary | ICD-10-CM

## 2022-03-19 DIAGNOSIS — Z7984 Long term (current) use of oral hypoglycemic drugs: Secondary | ICD-10-CM | POA: Diagnosis not present

## 2022-03-19 DIAGNOSIS — E118 Type 2 diabetes mellitus with unspecified complications: Secondary | ICD-10-CM | POA: Diagnosis present

## 2022-03-19 DIAGNOSIS — I513 Intracardiac thrombosis, not elsewhere classified: Secondary | ICD-10-CM | POA: Diagnosis present

## 2022-03-19 LAB — COMPREHENSIVE METABOLIC PANEL
ALT: 24 U/L (ref 0–44)
AST: 26 U/L (ref 15–41)
Albumin: 4 g/dL (ref 3.5–5.0)
Alkaline Phosphatase: 70 U/L (ref 38–126)
Anion gap: 7 (ref 5–15)
BUN: 11 mg/dL (ref 8–23)
CO2: 23 mmol/L (ref 22–32)
Calcium: 9.2 mg/dL (ref 8.9–10.3)
Chloride: 101 mmol/L (ref 98–111)
Creatinine, Ser: 0.71 mg/dL (ref 0.61–1.24)
GFR, Estimated: >60 mL/min (ref >60)
Glucose, Bld: 116 mg/dL — ABNORMAL HIGH (ref 70–99)
Potassium: 4.1 mmol/L (ref 3.5–5.1)
Sodium: 131 mmol/L — ABNORMAL LOW (ref 135–145)
Total Bilirubin: 1 mg/dL (ref 0.3–1.2)
Total Protein: 7 g/dL (ref 6.5–8.1)

## 2022-03-19 LAB — DIFFERENTIAL
Abs Immature Granulocytes: 0.02 10*3/uL (ref 0.00–0.07)
Basophils Absolute: 0 10*3/uL (ref 0.0–0.1)
Basophils Relative: 0 %
Eosinophils Absolute: 0 10*3/uL (ref 0.0–0.5)
Eosinophils Relative: 1 %
Immature Granulocytes: 0 %
Lymphocytes Relative: 17 %
Lymphs Abs: 1.4 10*3/uL (ref 0.7–4.0)
Monocytes Absolute: 0.7 10*3/uL (ref 0.1–1.0)
Monocytes Relative: 9 %
Neutro Abs: 5.9 10*3/uL (ref 1.7–7.7)
Neutrophils Relative %: 73 %

## 2022-03-19 LAB — CBC
HCT: 36.4 % — ABNORMAL LOW (ref 39.0–52.0)
Hemoglobin: 12.8 g/dL — ABNORMAL LOW (ref 13.0–17.0)
MCH: 33.2 pg (ref 26.0–34.0)
MCHC: 35.2 g/dL (ref 30.0–36.0)
MCV: 94.3 fL (ref 80.0–100.0)
Platelets: 267 10*3/uL (ref 150–400)
RBC: 3.86 MIL/uL — ABNORMAL LOW (ref 4.22–5.81)
RDW: 12.6 % (ref 11.5–15.5)
WBC: 8.1 10*3/uL (ref 4.0–10.5)
nRBC: 0 % (ref 0.0–0.2)

## 2022-03-19 LAB — PROTIME-INR
INR: 1 (ref 0.8–1.2)
Prothrombin Time: 13.4 seconds (ref 11.4–15.2)

## 2022-03-19 LAB — GLUCOSE, CAPILLARY: Glucose-Capillary: 87 mg/dL (ref 70–99)

## 2022-03-19 LAB — APTT: aPTT: 34 seconds (ref 24–36)

## 2022-03-19 MED ORDER — STROKE: EARLY STAGES OF RECOVERY BOOK
Freq: Once | Status: AC
Start: 2022-03-19 — End: 2022-03-19

## 2022-03-19 MED ORDER — ACETAMINOPHEN 650 MG RE SUPP: 650.0000 mg | RECTAL | Status: DC | PRN

## 2022-03-19 MED ORDER — ACETAMINOPHEN 160 MG/5ML PO SOLN: 650.0000 mg | ORAL | Status: DC | PRN

## 2022-03-19 MED ORDER — CLOPIDOGREL BISULFATE 75 MG PO TABS
75.0000 mg | ORAL_TABLET | Freq: Every day | ORAL | Status: DC
  Administered 2022-03-19: 75 mg via ORAL
  Filled 2022-03-19: qty 1

## 2022-03-19 MED ORDER — TAMSULOSIN HCL 0.4 MG PO CAPS
0.4000 mg | ORAL_CAPSULE | Freq: Every day | ORAL | Status: DC
  Administered 2022-03-19: 0.4 mg via ORAL
  Filled 2022-03-19 (×2): qty 1

## 2022-03-19 MED ORDER — ASPIRIN 81 MG PO TBEC
81.0000 mg | DELAYED_RELEASE_TABLET | Freq: Every day | ORAL | Status: DC
  Administered 2022-03-19 – 2022-03-20 (×2): 81 mg via ORAL
  Filled 2022-03-19 (×2): qty 1

## 2022-03-19 MED ORDER — LEVOTHYROXINE SODIUM 50 MCG PO TABS
75.0000 ug | ORAL_TABLET | Freq: Every day | ORAL | Status: DC
  Administered 2022-03-20: 75 ug via ORAL
  Filled 2022-03-19: qty 1

## 2022-03-19 MED ORDER — PANTOPRAZOLE SODIUM 40 MG PO TBEC
40.0000 mg | DELAYED_RELEASE_TABLET | Freq: Every day | ORAL | Status: DC
  Administered 2022-03-19 – 2022-03-20 (×2): 40 mg via ORAL
  Filled 2022-03-19 (×2): qty 1

## 2022-03-19 MED ORDER — CARVEDILOL 3.125 MG PO TABS
3.1250 mg | ORAL_TABLET | Freq: Two times a day (BID) | ORAL | Status: DC
  Administered 2022-03-20: 3.125 mg via ORAL
  Filled 2022-03-19: qty 1

## 2022-03-19 MED ORDER — ATORVASTATIN CALCIUM 80 MG PO TABS
80.0000 mg | ORAL_TABLET | Freq: Every day | ORAL | Status: DC
  Administered 2022-03-19: 80 mg via ORAL
  Filled 2022-03-19 (×2): qty 1

## 2022-03-19 MED ORDER — GABAPENTIN 300 MG PO CAPS
300.0000 mg | ORAL_CAPSULE | Freq: Three times a day (TID) | ORAL | Status: DC
  Administered 2022-03-19 – 2022-03-20 (×2): 300 mg via ORAL
  Filled 2022-03-19 (×2): qty 1

## 2022-03-19 MED ORDER — ACETAMINOPHEN 325 MG PO TABS: 650.0000 mg | ORAL_TABLET | ORAL | Status: DC | PRN

## 2022-03-19 MED ORDER — INSULIN ASPART 100 UNIT/ML IJ SOLN
0.0000 [IU] | Freq: Three times a day (TID) | INTRAMUSCULAR | Status: DC
  Administered 2022-03-20: 5 [IU] via SUBCUTANEOUS
  Filled 2022-03-19: qty 1

## 2022-03-19 MED ORDER — ENOXAPARIN SODIUM 40 MG/0.4ML IJ SOSY
40.0000 mg | PREFILLED_SYRINGE | INTRAMUSCULAR | Status: DC
  Administered 2022-03-19: 40 mg via SUBCUTANEOUS
  Filled 2022-03-19: qty 0.4

## 2022-03-19 MED ORDER — SODIUM CHLORIDE 0.9 % IV SOLN: INTRAVENOUS | Status: AC

## 2022-03-19 NOTE — Assessment & Plan Note (Signed)
2 acute issues at this time.  Has history of prior bleeding and was followed by GI

## 2022-03-19 NOTE — ED Triage Notes (Signed)
Pt states he has numbness in his left hand yesterday and thought it was just his carpel tunnel., states today he woke with left sided facial and arm numbness. Pt is ambulatory with a steady gait. Speech is clear, no noted facial droop at this time

## 2022-03-19 NOTE — ED Provider Notes (Signed)
[facility] Provider Note    Event Date/Time   First MD Initiated Contact with Patient 03/19/22 1641     (approximate)   History   Numbness   HPI  David [name] is a 76 y.o. 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  Denies any headache.  Feels like it is asleep.  Somewhat similar to his issues with carpal tunnel in the past but has had carpal tunnel release and is never had issues involving the upper arm or the left face.     Physical Exam   Triage Vital Signs: ED Triage Vitals [03/19/22 1530]  Enc Vitals Group     BP 140/75     Pulse Rate 73     Resp 18     Temp 98.7 F (37.1 C)     Temp Source Oral     SpO2 100 %     Weight      Height      Head Circumference      Peak Flow      Pain Score      Pain Loc      Pain Edu?      Excl. in [place]?     Most recent vital signs: Vitals:   03/19/22 1730 03/19/22 2007  BP: (!) 151/68 (!) 159/81  Pulse: 63 66  Resp: 13   Temp:    SpO2: 100% 100%     Constitutional: Alert  Eyes: Conjunctivae are normal.  Head: Atraumatic. Nose: No congestion/rhinnorhea. Mouth/Throat: Mucous membranes are moist.   Neck: Painless ROM.  Cardiovascular:   Good peripheral circulation. Respiratory: Normal respiratory effort.  No retractions.  Gastrointestinal: Soft and nontender.  Musculoskeletal:  no deformity Neurologic:  MAE spontaneously. Decreased sensation to lue and left face.  Good motor strength throughout. Skin:  Skin is warm, dry and intact. No rash noted. Psychiatric: Mood and affect are normal. Speech and behavior are normal.    ED Results / Procedures / Treatments   Labs (all labs ordered are listed, but only abnormal results are displayed) Labs Reviewed  CBC - Abnormal; Notable for the following components:       Result Value   RBC 3.86 (*)    Hemoglobin 12.8 (*)    HCT 36.4 (*)    All other components within normal limits  COMPREHENSIVE METABOLIC PANEL - Abnormal; Notable for the following components:   Sodium 131 (*)    Glucose, Bld 116 (*)    All other components within normal limits  PROTIME-INR  APTT  DIFFERENTIAL  CBG MONITORING, ED     EKG  ED ECG REPORT I, Merlyn Lot, the attending physician, personally viewed and interpreted this ECG.   Date: 03/19/2022  EKG Time: 15:27  Rate: 70  Rhythm: sinus  Axis: normal  Intervals:normal   ST&T Change: nonspecific st abn, no stemi    RADIOLOGY Please see ED Course for my review and interpretation.  I personally reviewed all radiographic images ordered to evaluate for the above acute complaints and reviewed radiology reports and findings.  These findings were personally discussed with the patient.  Please see medical record for radiology report.    PROCEDURES:  Critical Care performed: No  Procedures   MEDICATIONS ORDERED IN ED: Medications - No data to display   IMPRESSION /  MDM / ASSESSMENT AND PLAN / ED COURSE  I reviewed the triage vital signs and the nursing notes.                              Differential diagnosis includes, but is not limited to, cva, tia, hypoglycemia, dehydration, electrolyte abnormality, dissection, sepsis  Patient presented to the ER for evaluation of symptoms as described above.  Van negative.  Outside of the window for tPA but certainly concerning for CVA. This presenting complaint could reflect a potentially life-threatening illness therefore the patient will be placed on continuous pulse oximetry and telemetry for monitoring.  Laboratory evaluation will be sent to evaluate for the above complaints.   CT imaging ordered out of triage for the above complaints on my interpretation shows no evidence of bleed will await formal radiology report    Clinical Course as of 03/19/22 2012   Thu Mar 19, 2022  1727 Discussed case in consultation with Dr. Quinn Axe of neurology.  As the patient is already on dual antiplatelet therapy he is recommended MRI to better determine disposition.  If MRI negative will be appropriate for outpatient follow-up. [PR]  1959 Discussed case in consultation with radiology was reviewed MRI does show the patient has evidence of acute infarct in the right thalamus.  Patient will be admitted to the hospital for further work-up and management. [PR]    Clinical Course User Index [PR] Merlyn Lot, MD    Patient's presentation is most consistent with acute presentation with potential threat to life or bodily function.   FINAL CLINICAL IMPRESSION(S) / ED DIAGNOSES   Final diagnoses:  Cerebrovascular accident (CVA), unspecified mechanism ([place])     Rx / DC Orders   ED Discharge Orders     None        Note:  This document was prepared using Dragon voice recognition software and may include unintentional dictation errors.    Merlyn Lot, MD 03/19/22 2012

## 2022-03-19 NOTE — H&P (Signed)
History and Physical    Patient: David [name] EVO:[id number] DOB: 1946/01/07 DOA: 03/19/2022 DOS: the patient was seen and examined on 03/19/2022 PCP: Kirk Ruths, MD  Patient coming from: Home  Chief Complaint:  Chief Complaint  Patient presents with   Numbness    HPI: David [name] is a 76 y.o. male with medical history significant for CAD, HTN, HLD, hypothyroidism, ulcerative colitis in remission, CAD on medical management, with ischemic cardiomyopathy and EF 40% on echo 2022, left ventricular thrombus, off warfarin due to poor tolerance, last evaluated by his cardiologist, Dr. Ubaldo Glassing on 02/17/2022, severe left carpal tunnel syndrome with residual numbness and tingling left hand, on gabapentin, who presents to the ED, with numbness of the left forearm on awakening that started the day prior worsening on the day of arrival now extending to his left face.  The numbness is different from that associated with his carpal tunnel syndrome.  He denies alteration in his speech and denies weakness in the arms, legs or droopiness to the face.  Denies visual disturbance or headache. ED course and data review: Vitals within normal limits.  Labs including CMP, CBC, INR all unremarkable except for hemoglobin of 12.8 and sodium of 131.  EKG, personally viewed and interpreted shows sinus rhythm at 70 with nonspecific ST-T wave changes.  CT head showed an age-indeterminate small lacunar infarct in the right thalamus and follow-up with MRI confirmed a small acute or subacute infarct in the right thalamus which correlates with the findings on the CT. The emergency room provider spoke with neurologist, Dr. Quinn Axe who recommended admission to hospitalist service for further work-up.  Patient is already on dual antiplatelet with aspirin and Plavix which is being continued pending further recommendations.   Review of Systems: As mentioned in the history of present illness. All other systems reviewed and are  negative.  Past Medical History:  Diagnosis Date   Anemia    Arthritis of both hands    CAD (coronary artery disease) 08/03/2006   a.) Anterior STEMI 08/03/2006 --> LHC EF 48%; 90% pRCA, 99% mLAD --> PCI placing 2.0 x 28 mm Mini-Vision BMS to dLAD and 2.25 x 12 mm Mini-Vision BMS to pLAD; plans for staged PCI of RCA. b.) Staged PCI 10/25/20017: 2.0 x 18 mm Driver BMS to pRCA. c.) NSTEMI 09/2006. d.) LHC 07/07/2017: EF 25-35%; 100% mRCA; patent BMS x 2 to LAD; BMS to the RCA totally occ; not amenable to further intervention.   CHF (congestive heart failure) ([place])    a.) TTE 09/17/2006: EF 40%; LV mural thrombus; mild LVH; apical AK, inf HK; triv MR/TR/PR; G1DD. b.) TTE 06/16/2007: EF 35%; diff HK; apical AK; mild LA dil, triv AR/TR/PR, mild MR. c.) TTE 07/31/2010: EF 30%; septal HK, apical/inf/post AK; triv AR/MR; G1DD. d.) TTE 07/02/2017: EF 35%; mild LVH; diff HK; panval regurg. e.) TTE 03/13/2021: EF 40%; mild LCH; diff HK; Triv AR/PR, mild MR/TR; G1DD.   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 ([place]) 29/93/7169   a.) on vedolizumab   Colitis due to Clostridioides difficile 07/08/2011   Crohn's disease ([place])    Erectile dysfunction    a.) on PDE5i (tadalafil)   GERD (gastroesophageal reflux disease) 07/08/2011   History of heart artery stent    a.) TOTAL # stents (as of 01/01/2022): 3 --> 2.0 x 28 mm mini Vision BMS to the distal LAD, 2.25 x 12 mm mini Vision BMS to the proximal LAD (08/03/2006); 3.0 x 18 mm Driver BMS to the  proximal RCA (08/05/2006)   Hyperlipidemia, unspecified 07/08/2011   Hypothyroidism due to acquired atrophy of thyroid 02/15/2015   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 b.) TTE 09/17/2006: EF 40%. c.) Cardiac MRI 09/15/2006: EF 35-40%. d.) TTE 06/16/2007: EF 35%. e.) TTE 02/02/2008: EF 30%. f.) TTE 01/03/2009: EF 30%. g.) TTe 07/31/2010: EF 30%. h.) Stress echo 03/19/2015: EF  45%. i.) TTE 07/02/2017: EF 35%. j.) TTE 03/13/2021: EF 40%.   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   Melanoma ([place])    NSTEMI (non-ST elevated myocardial infarction) ([place]) 09/11/2006   Prostatitis    Squamous cell skin cancer    ST elevation myocardial infarction (STEMI) of anterior wall ([place]) 08/03/2006   a.) LHC 08/03/2006: EF 48%; 90% pRCA, 99% mLAD --> 2.0 x 28 mm Mini-Vision BMS to dLAD and 2.25 x 12 mm Mini-Vision BMS to pLAD; plan for staged PCI of RCA. b.) PCI of RCA 08/05/2006 placing a 3.0 x 18 mm Driver BMS to pRCA.   SVT (supraventricular tachycardia) (HCC)    T2DM (type 2 diabetes mellitus) ([place]) 07/08/2011   Vitamin D deficiency    Past Surgical History:  Procedure Laterality Date   BACK SURGERY     1972 ruptured disc   CARDIAC CATHETERIZATION     stents   CHOLECYSTECTOMY     COLONOSCOPY  07/20/2006   Crohn's disease   CORONARY ANGIOGRAPHY N/A 07/07/2017   Procedure: CORONARY ANGIOGRAPHY;  Surgeon: Teodoro Spray, MD;  Location: [place] [facility];  Service: Cardiovascular;  Laterality: N/A;   CORONARY STENT PLACEMENT     FLEXIBLE SIGMOIDOSCOPY N/A 09/25/2016   Procedure: FLEXIBLE SIGMOIDOSCOPY;  Surgeon: [name], MD;  Location: [facility] ENDOSCOPY;  Service: Endoscopy;  Laterality: N/A;   FRACTURE SURGERY Right    wrist fracture pins have been removed 1975   LEFT HEART CATH Left 07/07/2017   Procedure: Left Heart Cath;  Surgeon: Teodoro Spray, MD;  Location: [place] [facility];  Service: Cardiovascular;  Laterality: Left;   LYSIS OF ADHESION N/A 01/02/2022   Procedure: LYSIS OF PENILE ADHESIONS;  Surgeon: Billey Co, MD;  Location: [facility] ORS;  Service: Urology;  Laterality: N/A;   MELANOMA REMOVED     parotid gland  removal     w melanoma surgery lymph nodes removed   Social History:  reports that he has never smoked. He has never been exposed to tobacco smoke. He has never used smokeless tobacco. He reports that he does not drink alcohol and does not use drugs.  Allergies  Allergen Reactions   Mesalamine Nausea And Vomiting and Nausea Only    Has used since this was noted (12/26/21 cn)    Family History  Problem Relation Age of Onset   Heart disease Mother    Heart attack Father    Heart disease Sister    Heart disease Brother     Prior to Admission medications   Medication Sig Start Date End Date Taking? Authorizing Provider  ACCU-CHEK AVIVA PLUS test strip 1 each by Other route as needed for other.  09/01/16   [provider]  ACCU-CHEK SOFTCLIX LANCETS lancets USE 2 TIMES DAILY. USE  AS INSTRUCTED. 11/30/16   [provider]  aspirin EC 81 MG tablet Take 81 mg by mouth in the morning and at bedtime.    [provider]  atorvastatin (LIPITOR) 40 MG tablet Take 40 mg by mouth 2 (two) times a day.  05/21/16   [provider]  betamethasone dipropionate 0.05 % cream APPLY TO AFFECTED AREA TWICE A DAY 03/16/22   Billey Co, MD  carvedilol (COREG) 3.125 MG tablet Take 3.125 mg by mouth 2 (two) times daily with a meal.  05/21/16   [provider]  Cholecalciferol (D 5000) 125 MCG (5000 UT) capsule Take 5,000 Units by mouth daily.     [provider]  clopidogrel (PLAVIX) 75 MG tablet Take 75 mg by mouth at bedtime. 01/13/19   [provider]  gabapentin (NEURONTIN) 300 MG capsule Take 300 mg by mouth 3 (three) times daily.    [provider]  glipiZIDE (GLUCOTROL) 10 MG tablet Take 10 mg by mouth 2 (two) times daily.  05/21/16   [provider]  levothyroxine (SYNTHROID) 75 MCG tablet Take 75 mcg by mouth daily before breakfast.    [provider]  Multiple Vitamins-Minerals (CENTRUM SILVER 50+MEN PO) Take 1 tablet  by mouth daily.    [provider]  omeprazole (PRILOSEC) 20 MG capsule Take 20 mg by mouth in the morning. 07/17/19   [provider]  prednisoLONE acetate (PRED FORTE) 1 % ophthalmic suspension Administer 1 drop to the right eye four (4) times a day. 02/09/22 06/09/22  [provider]  Probiotic Product (PROBIOTIC DAILY PO) Take 1 capsule by mouth daily.     [provider]  tadalafil (CIALIS) 5 MG tablet Take 1-3 tablets (5-15 mg total) by mouth daily as needed for erectile dysfunction. 09/02/21   Billey Co, MD  tamsulosin (FLOMAX) 0.4 MG CAPS capsule Take 1 capsule (0.4 mg total) by mouth daily. 09/02/21   Billey Co, MD  traMADol (ULTRAM) 50 MG tablet Take 50 mg by mouth 2 (two) times daily as needed. 04/30/21   [provider]  valACYclovir (VALTREX) 500 MG tablet Take 1 tablet by mouth 2 (two) times daily. 02/09/22 06/09/22  [provider]  vedolizumab (ENTYVIO) 300 MG injection Inject into the vein. Every 8 weeks 11/13/21   [name], MD    Physical Exam: Vitals:   03/19/22 1700 03/19/22 1714 03/19/22 1730 03/19/22 2007  BP: (!) 146/76  (!) 151/68 (!) 159/81  Pulse:  64 63 66  Resp:  19 13   Temp:      TempSrc:      SpO2:  100% 100% 100%   Physical Exam Vitals and nursing note reviewed.  Constitutional:      General: He is not in acute distress. HENT:     Head: Normocephalic and atraumatic.  Cardiovascular:     Rate and Rhythm: Normal rate and regular rhythm.     Heart sounds: Normal heart sounds.  Pulmonary:     Effort: Pulmonary effort is normal.     Breath sounds: Normal breath sounds.  Abdominal:     Palpations: Abdomen is soft.     Tenderness: There is no abdominal tenderness.  Neurological:     Mental Status: Mental status is at baseline.     Labs on Admission: I have personally reviewed following labs and imaging studies  CBC: Recent Labs  Lab 03/19/22 1530  WBC 8.1  NEUTROABS 5.9  HGB 12.8*  HCT 36.4*  MCV 94.3  PLT 937   Basic Metabolic Panel: Recent Labs  Lab 03/19/22 1530  NA 131*  K 4.1  CL 101  CO2 23  GLUCOSE 116*  BUN 11  CREATININE 0.71  CALCIUM 9.2   GFR: Estimated Creatinine Clearance: 84.4 mL/min (by C-G formula based on SCr of 0.71 mg/dL). Liver Function Tests: Recent Labs  Lab 03/19/22 1530  AST 26  ALT 24  ALKPHOS 70  BILITOT 1.0  PROT 7.0  ALBUMIN 4.0   No results for input(s): "LIPASE", "AMYLASE" in the last 168 hours. No results for input(s): "AMMONIA" in the last 168 hours. Coagulation Profile: Recent Labs  Lab 03/19/22 1530  INR 1.0   Cardiac Enzymes: No results for input(s): "CKTOTAL", "CKMB", "CKMBINDEX", "TROPONINI" in the last 168 hours. BNP (last 3 results) No results for input(s): "PROBNP" in the last 8760 hours. HbA1C: No results for input(s): "HGBA1C" in the last 72 hours. CBG: No results for input(s): "GLUCAP" in the last 168 hours. Lipid Profile: No results for input(s): "CHOL", "HDL", "LDLCALC", "TRIG", "CHOLHDL", "LDLDIRECT" in the last 72 hours. Thyroid Function Tests: No results for input(s): "TSH", "T4TOTAL", "FREET4", "T3FREE", "THYROIDAB" in the last 72 hours. Anemia Panel: No results for input(s): "VITAMINB12", "FOLATE", "FERRITIN", "TIBC", "IRON", "RETICCTPCT" in the last 72 hours. Urine analysis:    Component Value Date/Time   COLORURINE YELLOW (A) 10/26/2016 2030   [place] (A) 10/26/2016 2030   LABSPEC 1.039 (H) 10/26/2016 2030   PHURINE 5.0 10/26/2016 2030   GLUCOSEU >=500 (A) 10/26/2016 2030   HGBUR NEGATIVE 10/26/2016 2030   [place] NEGATIVE 10/26/2016 2030   KETONESUR 20 (A) 10/26/2016 2030   PROTEINUR NEGATIVE 10/26/2016 2030   NITRITE NEGATIVE 10/26/2016 2030   LEUKOCYTESUR NEGATIVE 10/26/2016 2030    Radiological Exams on Admission: MR BRAIN WO CONTRAST  Result Date: 03/19/2022 CLINICAL DATA:  Left arm numbness and sensation change, stroke suspected EXAM: MRI HEAD WITHOUT  CONTRAST TECHNIQUE: Multiplanar, multiecho pulse sequences of the brain and surrounding structures were obtained without intravenous contrast. COMPARISON:  No prior MRI, correlation is made with CT head 03/19/2022 FINDINGS: Brain: Small area of restricted diffusion with ADC correlate in the right thalamus (series 5, image 23 and series 6, image 23 and series 7, image 18), which measures up to 7 mm, is associated with mildly increased T2 hyperintense signal, and is most likely an acute or subacute infarct. No acute hemorrhage, mass, mass effect, or midline shift. No hydrocephalus or extra-axial collection. Vascular: Normal flow voids. Skull and upper cervical spine: Normal marrow signal. Sinuses/Orbits: No acute finding. Other: Trace fluid in the left mastoid air cells. IMPRESSION: Small acute or subacute infarct in the right thalamus which correlates with the hypodensity on the same-day CT. These results were called by telephone at the time of interpretation on 03/19/2022 at 7:59 pm to provider Merlyn Lot , who verbally acknowledged these results. Electronically Signed   By: Merilyn Baba M.D.   On: 03/19/2022 19:59   CT HEAD WO CONTRAST  Result Date: 03/19/2022 CLINICAL DATA:  Neuro deficit, acute, stroke suspected EXAM: CT HEAD WITHOUT CONTRAST TECHNIQUE: Contiguous axial images were obtained from the base of the skull through the vertex without intravenous contrast. RADIATION DOSE REDUCTION: This exam was performed according to the departmental dose-optimization program which includes automated exposure control, adjustment of the mA and/or kV according to patient size and/or use of iterative reconstruction technique. COMPARISON:  None Available. FINDINGS: Brain: Age indeterminate small lacunar infarct in  the right thalamus. No evidence of acute hemorrhage, mass lesion, midline shift, or hydrocephalus. Vascular: No hyperdense vessel identified. Calcific intracranial atherosclerosis. Skull: No acute  fracture. Sinuses/Orbits: Clear sinuses.  No acute orbital findings. Other: No mastoid effusions. IMPRESSION: Age indeterminate small lacunar infarct in the right thalamus. An MRI could further evaluate for acute infarct. Electronically Signed   By: Margaretha Sheffield M.D.   On: 03/19/2022 15:43     Data Reviewed: Relevant notes from primary care and specialist visits, past discharge summaries as available in EHR, including Care Everywhere. Prior diagnostic testing as pertinent to current admission diagnoses Updated medications and problem lists for reconciliation ED course, including vitals, labs, imaging, treatment and response to treatment Triage notes, nursing and pharmacy notes and ED provider's notes Notable results as noted in HPI   Assessment and Plan: * Acute cerebrovascular accident (CVA) ([place]) MRI confirmed a small acute or subacute infarct in the right thalamus  Source possibly known left ventricular thrombus Patient already on aspirin and Plavix and atorvastatin 40 We will intensify atorvastatin.  Might benefit from going back on systemic anticoagulation but will defer to neurologist Permissive hypertension for first 24-48 hrs post stroke onset: Prn Labetalol IV or Vasotec IV If BP greater than 220/120  Avoid dextrose containing fluids, Maintain euglycemia, euthermia Neuro checks q4 hrs x 24 hrs and then per shift Echocardiogram, continuous cardiac monitoring and carotid Doppler Physical therapy/Occupational therapy/Speech therapy if failed dysphagia screen Neurology consult to follow   Carpal tunnel syndrome Continue gabapentin Patient follows with neurology  Controlled type 2 diabetes mellitus with complication, without long-term current use of insulin (HCC) Will hold glipizide Sliding scale insulin  Chronic systolic CHF secondary to ischemic cardiomyopathy (congestive heart failure) (HCC) Currently euvolemic Continue carvedilol  Hypertension, essential BP  slightly elevated.  Continue carvedilol  Ulcerative colitis in remission ([place]) 2 acute issues at this time.  Has history of prior bleeding and was followed by GI  Left ventricular apical thrombus Stable and is followed by cardiologist, Dr. Ubaldo Glassing last seen in April 2023 Patient previously on warfarin but did not tolerate it well Follow-up echocardiogram  CAD (coronary artery disease) History of MI with residual left ventricular thrombus No complaints of chest pain and EKG nonacute Continue aspirin, Plavix, carvedilol and atorvastatin        DVT prophylaxis: Lovenox  Consults: Neurology, Dr. Quinn Axe  Advance Care Planning:   Code Status: Prior   Family Communication: none  Disposition Plan: Back to previous home environment  Severity of Illness: The appropriate patient status for this patient is OBSERVATION. Observation status is judged to be reasonable and necessary in order to provide the required intensity of service to ensure the patient's safety. The patient's presenting symptoms, physical exam findings, and initial radiographic and laboratory data in the context of their medical condition is felt to place them at decreased risk for further clinical deterioration. Furthermore, it is anticipated that the patient will be medically stable for discharge from the hospital within 2 midnights of admission.   Author: Athena Masse, MD 03/19/2022 8:30 PM  For on call review [email or web address].

## 2022-03-19 NOTE — Assessment & Plan Note (Signed)
Currently euvolemic Continue carvedilol

## 2022-03-19 NOTE — ED Provider Triage Note (Signed)
Emergency Medicine Provider Triage Evaluation Note  David [name], a 76 y.o. male  was evaluated in triage.  Pt complains of left arm numbness and sensation change.  Patient notes onset upon waking this morning.  Denies any recent head injury, trauma, fall.  He denies any chest pain, shortness of breath, headache, or vision change.  Patient is status post stent placement on anticoagulation therapy.  Review of Systems  Positive: LUE numbness Negative: CP, SOB, syncope  Physical Exam  BP 140/75   Pulse 73   Temp 98.7 F (37.1 C) (Oral)   Resp 18   SpO2 100%  Gen:   Awake, no distress  NAD Resp:  Normal effort CTA MSK:   Moves extremities without difficulty Normal composite fist bilaterally Other:    Medical Decision Making  Medically screening exam initiated at 3:32 PM.  Appropriate orders placed.  David [name] was informed that the remainder of the evaluation will be completed by another provider, this initial triage assessment does not replace that evaluation, and the importance of remaining in the ED until their evaluation is complete.  Geriatric patient to the ED for evaluation of left arm numbness with onset this morning upon awakening.  Patient without any reports of recent injury, trauma, fall.   David Needles, PA-C 03/19/22 1539

## 2022-03-19 NOTE — Assessment & Plan Note (Signed)
Continue gabapentin Patient follows with neurology

## 2022-03-19 NOTE — Assessment & Plan Note (Signed)
Will hold glipizide Sliding scale insulin

## 2022-03-19 NOTE — Assessment & Plan Note (Signed)
Stable and is followed by cardiologist, Dr. Ubaldo Glassing last seen in April 2023 Patient previously on warfarin but did not tolerate it well Follow-up echocardiogram

## 2022-03-19 NOTE — Assessment & Plan Note (Signed)
BP slightly elevated.  Continue carvedilol

## 2022-03-19 NOTE — Assessment & Plan Note (Signed)
History of MI with residual left ventricular thrombus No complaints of chest pain and EKG nonacute Continue aspirin, Plavix, carvedilol and atorvastatin

## 2022-03-19 NOTE — Assessment & Plan Note (Addendum)
MRI confirmed a small acute or subacute infarct in the right thalamus  Source possibly known left ventricular thrombus Patient already on aspirin and Plavix and atorvastatin 40 We will intensify atorvastatin.  Might benefit from going back on systemic anticoagulation but will defer to neurologist Permissive hypertension for first 24-48 hrs post stroke onset: Prn Labetalol IV or Vasotec IV If BP greater than 220/120  Avoid dextrose containing fluids, Maintain euglycemia, euthermia Neuro checks q4 hrs x 24 hrs and then per shift Echocardiogram, continuous cardiac monitoring and carotid Doppler Physical therapy/Occupational therapy/Speech therapy if failed dysphagia screen Neurology consult to follow

## 2022-03-20 ENCOUNTER — Observation Stay: Payer: Medicare Other

## 2022-03-20 ENCOUNTER — Observation Stay: Admit: 2022-03-20 | Payer: Medicare Other

## 2022-03-20 ENCOUNTER — Encounter: Payer: Self-pay | Admitting: Internal Medicine

## 2022-03-20 ENCOUNTER — Observation Stay
Admit: 2022-03-20 | Discharge: 2022-03-20 | Disposition: A | Discharge status: Home / Self Care | Payer: Medicare Other | Attending: Internal Medicine | Admitting: Internal Medicine

## 2022-03-20 DIAGNOSIS — E118 Type 2 diabetes mellitus with unspecified complications: Secondary | ICD-10-CM

## 2022-03-20 DIAGNOSIS — I5022 Chronic systolic (congestive) heart failure: Secondary | ICD-10-CM | POA: Diagnosis not present

## 2022-03-20 DIAGNOSIS — I639 Cerebral infarction, unspecified: Secondary | ICD-10-CM | POA: Diagnosis not present

## 2022-03-20 LAB — ECHOCARDIOGRAM COMPLETE
AR max vel: 2.64 cm2
AV Area VTI: 2.79 cm2
AV Area mean vel: 2.65 cm2
AV Mean grad: 2 mmHg
AV Peak grad: 4.1 mmHg
Ao pk vel: 1.01 m/s
Area-P 1/2: 4.24 cm2
Height: 71 in
MV VTI: 2.48 cm2
S' Lateral: 3.14 cm
Weight: 2663.16 oz

## 2022-03-20 LAB — GLUCOSE, CAPILLARY
Glucose-Capillary: 123 mg/dL — ABNORMAL HIGH (ref 70–99)
Glucose-Capillary: 214 mg/dL — ABNORMAL HIGH (ref 70–99)

## 2022-03-20 LAB — LIPID PANEL
Cholesterol: 123 mg/dL (ref 0–200)
HDL: 26 mg/dL — ABNORMAL LOW (ref >40)
LDL Cholesterol: 60 mg/dL (ref 0–99)
Total CHOL/HDL Ratio: 4.7 RATIO
Triglycerides: 186 mg/dL — ABNORMAL HIGH (ref <150)
VLDL: 37 mg/dL (ref 0–40)

## 2022-03-20 LAB — HEMOGLOBIN A1C
Hgb A1c MFr Bld: 5.6 % (ref 4.8–5.6)
Mean Plasma Glucose: 114.02 mg/dL

## 2022-03-20 MED ORDER — IOHEXOL 350 MG/ML SOLN
75.0000 mL | Freq: Once | INTRAVENOUS | Status: AC | PRN
  Administered 2022-03-20: 75 mL via INTRAVENOUS

## 2022-03-20 MED ORDER — SALINE SPRAY 0.65 % NA SOLN
1.0000 | NASAL | Status: DC | PRN
  Administered 2022-03-20: 1 via NASAL
  Filled 2022-03-20: qty 44

## 2022-03-20 MED ORDER — PERFLUTREN LIPID MICROSPHERE
1.0000 mL | INTRAVENOUS | Status: AC | PRN
  Administered 2022-03-20: 3 mL via INTRAVENOUS

## 2022-03-20 MED ORDER — ATORVASTATIN CALCIUM 80 MG PO TABS: 80.0000 mg | ORAL_TABLET | Freq: Every day | ORAL | 0 refills | Status: DC

## 2022-03-20 MED ORDER — MELATONIN 5 MG PO TABS
5.0000 mg | ORAL_TABLET | Freq: Every evening | ORAL | Status: DC | PRN
  Administered 2022-03-20: 5 mg via ORAL
  Filled 2022-03-20: qty 1

## 2022-03-20 MED ORDER — LORATADINE 10 MG PO TABS: 10.0000 mg | ORAL_TABLET | Freq: Every day | ORAL | Status: DC | PRN

## 2022-03-20 NOTE — Progress Notes (Signed)
SLP Cancellation Note  Patient Details Name: MILLARD BAUTCH MRN: [id number] DOB: [birth year]   Cancelled treatment:        Chart reviewed, Pt visited. Speech was clear and appropriate. Pt reports no change in speech and no difficulty swallowing. Pt was out for test when food arrived so it was cold. ST got Pt new hot coffee and called dietary for new hot plate. No ST needs at this time. Please reponsult if we can be of further assist.   Lucila Maine 03/20/2022, 9:32 AM

## 2022-03-20 NOTE — Progress Notes (Signed)
*  PRELIMINARY RESULTS* Echocardiogram 2D Echocardiogram has been performed.  David [name] 03/20/2022, 12:25 PM

## 2022-03-20 NOTE — Evaluation (Signed)
Occupational Therapy Evaluation Patient Details Name: David [name] MRN: [id number] DOB: 1946/01/20 Today's Date: 03/20/2022   History of Present Illness Pt. is a 76 y.o. 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 MRI confirmed a small acute or subacute infarct in the right thalamus.   Clinical Impression   Pt. Presents with a new onset  left forearm, and hand numbness. Pt. Has had a history of left hand numbness from CTS. After having CTR surgery the numbness mostly improved, however remained in the distal tips of the digits on the left hand. Pt. Now has numbness in the left hand, and forearm. Pt.'s strength, and [place] are WFL. Pt. Is able to tie his shoes, and button a dress shirt efficiently without any lack of coordination. Pt. Reports that he historically would occasionally miss a button hole on his shirts due to them being hard to see. Pt. is performing ADLs, and toileting skills independently. Pt. Education was provided about compensatory strategies for sensory changes in the LUE. Pt. is aware of compensatory strategies, and is actively using them as he uses his left hand. Additional OT services are not warranted at this time. WIll complete the order.      Recommendations for follow up therapy are one component of a multi-disciplinary discharge planning process, led by the attending physician.  Recommendations may be updated based on patient status, additional functional criteria and insurance authorization.   Follow Up Recommendations  No OT follow up    Assistance Recommended at Discharge None  Patient can return home with the following      Functional Status Assessment     Equipment Recommendations       Recommendations for Other Services       Precautions / Restrictions  Precautions Precautions: Fall Restrictions Weight Bearing Restrictions: No      Mobility Bed Mobility Overal bed mobility: Independent                  Transfers Overall transfer level: Independent                        Balance Overall balance assessment: Independent                                         ADL either performed or assessed with clinical judgement   ADL Overall ADL's : Needs assistance/impaired Eating/Feeding: Independent   Grooming: Independent               Lower Body Dressing: Independent   Toilet Transfer: Independent       Tub/ Banker: Independent   Functional mobility during ADLs: Independent       Vision         Perception     Praxis      Pertinent Vitals/Pain Pain Assessment Pain Assessment: No/denies pain     Hand Dominance Right   Extremity/Trunk Assessment Upper Extremity Assessment Upper Extremity Assessment: LUE deficits/detail LUE Deficits / Details: Strength, and [place] skills WFL LUE Sensation: decreased light touch   Lower Extremity Assessment Lower Extremity Assessment: Overall WFL for tasks assessed   Cervical / Trunk Assessment Cervical / Trunk Assessment: Kyphotic   Communication Communication Communication: No difficulties  Cognition Arousal/Alertness: Awake/alert Behavior During Therapy: WFL for tasks assessed/performed Overall Cognitive Status: Within Functional Limits for tasks assessed                                       General Comments       Exercises     Shoulder Instructions      Home Living Family/patient expects to be discharged to:: Private residence Living Arrangements: Spouse/significant other Available Help at Discharge: Family Type of Home: House Home Access: Stairs to enter [employer] of Steps: 2 Entrance Stairs-Rails: Right Home Layout: Two level;Able to live on main level with  bedroom/bathroom Alternate Level Stairs-Number of Steps: Does not access 2nd level             Home Equipment: None          Prior Functioning/Environment Prior Level of Function : Independent/Modified Independent             Mobility Comments: Pt. reports history for back pain limiting static standing while cooking ADLs Comments: Was mowing his lawn prior to admission        OT Problem List:        OT Treatment/Interventions:      OT Goals(Current goals can be found in the care plan section) Acute Rehab OT Goals Patient Stated Goal: To return home OT Goal Formulation: With patient Potential to Achieve Goals: Good  OT Frequency:      Co-evaluation              AM-PAC OT "6 Clicks" Daily Activity     Outcome Measure Help from another person eating meals?: None Help from another person taking care of personal grooming?: None Help from another person toileting, which includes using toliet, bedpan, or urinal?: None Help from another person bathing (including washing, rinsing, drying)?: None Help from another person to put on and taking off regular upper body clothing?: None Help from another person to put on and taking off regular lower body clothing?: None 6 Click Score: 24   End of Session Equipment Utilized During Treatment: Gait belt  Activity Tolerance: Patient tolerated treatment well Patient left: in bed;with call bell/phone within reach                   Time: 1032-1058 OT Time Calculation (min): 26 min Charges:  OT Evaluation $OT Eval Low Complexity: 1 Low  Harrel Carina, MS, OTR/L   Harrel Carina 03/20/2022, 1:18 PM

## 2022-03-20 NOTE — Consult Note (Signed)
NEUROLOGY CONSULTATION NOTE   Date of service: March 20, 2022 Patient Name: David [name] MRN:  [id number] DOB:  [birth year] Reason for consult: thalamic stroke Requesting physician: Dr. Max Sane _ _ _   _ __   _ __ _ _  __ __   _ __   __ _  History of Present Illness   This is a 76 year old gentleman with past medical history significant for CAD, hypertension, hyperlipidemia, hypothyroidism, ulcerative colitis in remission, CAD on medical management, ischemic cardiomyopathy last ejection fraction 40% on echo in 2022, history of left ventricular thrombus last visualized in 2018 previously on warfarin discontinued 2 poor tolerance (last evaluated by his cardiologist Dr. 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 He has no other neurologic deficits.  MRI brain without contrast shows a small acute infarct in the right thalamus (personal review).  CTA head and neck is pending as is TTE.  Stroke Labs     Component Value Date/Time   CHOL 123 03/20/2022 0623   TRIG 186 (H) 03/20/2022 0623   HDL 26 (L) 03/20/2022 0623   CHOLHDL 4.7 03/20/2022 0623   VLDL 37 03/20/2022 0623   LDLCALC 60 03/20/2022 0623    Lab Results  Component Value Date/Time   HGBA1C 5.6 03/19/2022 09:04 PM    ROS   Per HPI: all other systems reviewed and are negative  Past History   I have reviewed the following:  Past Medical History:  Diagnosis Date   Anemia    Arthritis of both hands    CAD (coronary artery disease) 08/03/2006   a.) Anterior STEMI 08/03/2006 --> LHC EF 48%; 90% pRCA, 99% mLAD --> PCI placing 2.0 x 28 mm Mini-Vision BMS to dLAD and 2.25 x 12 mm Mini-Vision BMS to pLAD; plans for staged PCI of RCA. b.) Staged PCI 10/25/20017: 2.0 x 18 mm Driver BMS to pRCA. c.) NSTEMI 09/2006. d.) LHC 07/07/2017: EF 25-35%; 100% mRCA;  patent BMS x 2 to LAD; BMS to the RCA totally occ; not amenable to further intervention.   CHF (congestive heart failure) ([place])    a.) TTE 09/17/2006: EF 40%; LV mural thrombus; mild LVH; apical AK, inf HK; triv MR/TR/PR; G1DD. b.) TTE 06/16/2007: EF 35%; diff HK; apical AK; mild LA dil, triv AR/TR/PR, mild MR. c.) TTE 07/31/2010: EF 30%; septal HK, apical/inf/post AK; triv AR/MR; G1DD. d.) TTE 07/02/2017: EF 35%; mild LVH; diff HK; panval regurg. e.) TTE 03/13/2021: EF 40%; mild LCH; diff HK; Triv AR/PR, mild MR/TR; G1DD.   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 ([place]) 81/15/7262   a.) on vedolizumab   Colitis due to Clostridioides difficile 07/08/2011   Crohn's disease ([place])    Erectile dysfunction    a.) on PDE5i (tadalafil)   GERD (gastroesophageal reflux disease) 07/08/2011   History of heart artery stent    a.) TOTAL # stents (as of 01/01/2022): 3 --> 2.0 x 28 mm mini Vision BMS to the distal LAD, 2.25 x 12 mm mini Vision BMS to the proximal LAD (08/03/2006); 3.0 x 18 mm Driver BMS to the proximal RCA (08/05/2006)   Hyperlipidemia, unspecified 07/08/2011   Hypothyroidism due to acquired atrophy of thyroid 02/15/2015   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 b.) TTE 09/17/2006: EF 40%. c.)  Cardiac MRI 09/15/2006: EF 35-40%. d.) TTE 06/16/2007: EF 35%. e.) TTE 02/02/2008: EF 30%. f.) TTE 01/03/2009: EF 30%. g.) TTe 07/31/2010: EF 30%. h.) Stress echo 03/19/2015: EF 45%. i.) TTE 07/02/2017: EF 35%. j.) TTE 03/13/2021: EF 40%.   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   Melanoma ([place])    NSTEMI (non-ST elevated myocardial infarction) ([place]) 09/11/2006    Prostatitis    Squamous cell skin cancer    ST elevation myocardial infarction (STEMI) of anterior wall ([place]) 08/03/2006   a.) LHC 08/03/2006: EF 48%; 90% pRCA, 99% mLAD --> 2.0 x 28 mm Mini-Vision BMS to dLAD and 2.25 x 12 mm Mini-Vision BMS to pLAD; plan for staged PCI of RCA. b.) PCI of RCA 08/05/2006 placing a 3.0 x 18 mm Driver BMS to pRCA.   SVT (supraventricular tachycardia) ([facility])    T2DM (type 2 diabetes mellitus) ([place]) 07/08/2011   Vitamin D deficiency    Past Surgical History:  Procedure Laterality Date   BACK SURGERY     1972 ruptured disc   CARDIAC CATHETERIZATION     stents   CHOLECYSTECTOMY     COLONOSCOPY  07/20/2006   Crohn's disease   CORONARY ANGIOGRAPHY N/A 07/07/2017   Procedure: CORONARY ANGIOGRAPHY;  Surgeon: Teodoro Spray, MD;  Location: [facility];  Service: Cardiovascular;  Laterality: N/A;   CORONARY STENT PLACEMENT     FLEXIBLE SIGMOIDOSCOPY N/A 09/25/2016   Procedure: FLEXIBLE SIGMOIDOSCOPY;  Surgeon: [name], MD;  Location: [facility];  Service: Endoscopy;  Laterality: N/A;   FRACTURE SURGERY Right    wrist fracture pins have been removed 1975   LEFT HEART CATH Left 07/07/2017   Procedure: Left Heart Cath;  Surgeon: Teodoro Spray, MD;  Location: [facility];  Service: Cardiovascular;  Laterality: Left;   LYSIS OF ADHESION N/A 01/02/2022   Procedure: LYSIS OF PENILE ADHESIONS;  Surgeon: Billey Co, MD;  Location: [facility];  Service: Urology;  Laterality: N/A;   MELANOMA REMOVED     parotid gland removal     w melanoma surgery lymph nodes removed   Family History  Problem Relation Age of Onset   Heart disease Mother    Heart attack Father    Heart disease Sister    Heart disease Brother    Social History   Socioeconomic History   Marital status: Married    Spouse name: Not on file   Number of children: Not on file   Years of education: Not on file   Highest education level: Not on file  Occupational History    Not on file  Tobacco Use   Smoking status: Never    Passive exposure: Never   Smokeless tobacco: Never  Vaping Use   Vaping Use: Never used  Substance and Sexual Activity   Alcohol use: No   Drug use: No   Sexual activity: Not on file  Other Topics Concern   Not on file  Social History Narrative   Not on file   Social Determinants of Health   Financial Resource Strain: Not on file  Food Insecurity: Not on file  Transportation Needs: Not on file  Physical Activity: Not on file  Stress: Not on file  Social Connections: Not on file   Allergies  Allergen Reactions   Mesalamine Nausea And Vomiting and Nausea Only    Has used since this was noted (12/26/21 cn)    Medications   Medications Prior to Admission  Medication Sig Dispense Refill Last Dose   acyclovir (ZOVIRAX) 400 MG tablet Take 400 mg by mouth 3 (three) times daily.   03/19/2022   aspirin EC 81 MG tablet Take 81 mg by mouth in the morning and at bedtime.   03/19/2022   atorvastatin (LIPITOR) 40 MG tablet Take 40 mg by mouth 2 (two) times a day.    03/19/2022   betamethasone dipropionate 0.05 % cream APPLY TO AFFECTED AREA TWICE A DAY 30 g 0    carvedilol (COREG) 3.125 MG tablet Take 3.125 mg by mouth 2 (two) times daily with a meal.    03/19/2022   clopidogrel (PLAVIX) 75 MG tablet Take 75 mg by mouth at bedtime.   03/19/2022   gabapentin (NEURONTIN) 300 MG capsule Take 300 mg by mouth daily in the afternoon.   03/19/2022   glipiZIDE (GLUCOTROL) 10 MG tablet Take 10 mg by mouth 2 (two) times daily.    03/19/2022   levothyroxine (SYNTHROID) 75 MCG tablet Take 75 mcg by mouth daily before breakfast.   03/19/2022   Multiple Vitamins-Minerals (CENTRUM SILVER 50+MEN PO) Take 1 tablet by mouth daily.   03/19/2022   Multiple Vitamins-Minerals (PRESERVISION AREDS 2 PO) Take by mouth.   03/19/2022   Omega 3 1200 MG CAPS Take by mouth.   03/19/2022   omeprazole (PRILOSEC) 20 MG capsule Take 20 mg by mouth in the morning.   03/19/2022   Probiotic  Product (PROBIOTIC DAILY PO) Take 1 capsule by mouth daily.    03/19/2022   tadalafil (CIALIS) 5 MG tablet Take 1-3 tablets (5-15 mg total) by mouth daily as needed for erectile dysfunction. 30 tablet 6 PRN   tamsulosin (FLOMAX) 0.4 MG CAPS capsule Take 1 capsule (0.4 mg total) by mouth daily. 30 capsule 11 03/19/2022   traMADol (ULTRAM) 50 MG tablet Take 50-100 mg by mouth daily in the afternoon.   03/19/2022   valACYclovir (VALTREX) 500 MG tablet Take 1 tablet by mouth 2 (two) times daily.   PRN   vitamin B-12 (CYANOCOBALAMIN) 500 MCG tablet Take 500 mcg by mouth daily.   03/19/2022   ACCU-CHEK AVIVA PLUS test strip 1 each by Other route as needed for other.       ACCU-CHEK SOFTCLIX LANCETS lancets USE 2 TIMES DAILY. USE AS INSTRUCTED.      Cholecalciferol (D 5000) 125 MCG (5000 UT) capsule Take 5,000 Units by mouth daily.  (Patient not taking: Reported on 03/20/2022)   Not Taking   prednisoLONE acetate (PRED FORTE) 1 % ophthalmic suspension Administer 1 drop to the right eye four (4) times a day.      vedolizumab (ENTYVIO) 300 MG injection Inject into the vein. Every 8 weeks 1 each 6       Current Facility-Administered Medications:    acetaminophen (TYLENOL) tablet 650 mg, 650 mg, Oral, Q4H PRN **OR** acetaminophen (TYLENOL) 160 MG/5ML solution 650 mg, 650 mg, Per Tube, Q4H PRN **OR** acetaminophen (TYLENOL) suppository 650 mg, 650 mg, Rectal, Q4H PRN, Athena Masse, MD   aspirin EC tablet 81 mg, 81 mg, Oral, Daily, Judd Gaudier V, MD, 81 mg at 03/20/22 0945   atorvastatin (LIPITOR) tablet 80 mg, 80 mg, Oral, Daily, Athena Masse, MD, 80 mg at 03/19/22  2157   carvedilol (COREG) tablet 3.125 mg, 3.125 mg, Oral, BID WC, Judd Gaudier V, MD, 3.125 mg at 03/20/22 0945   clopidogrel (PLAVIX) tablet 75 mg, 75 mg, Oral, QHS, Athena Masse, MD, 75 mg at 03/19/22 2157   enoxaparin (LOVENOX) injection 40 mg, 40 mg, Subcutaneous, Q24H, Athena Masse, MD, 40 mg at 03/19/22 2155   gabapentin (NEURONTIN)  capsule 300 mg, 300 mg, Oral, TID, Athena Masse, MD, 300 mg at 03/20/22 0945   insulin aspart (novoLOG) injection 0-15 Units, 0-15 Units, Subcutaneous, TID WC, Athena Masse, MD   levothyroxine (SYNTHROID) tablet 75 mcg, 75 mcg, Oral, QAC breakfast, Athena Masse, MD, 75 mcg at 03/20/22 0500   loratadine (CLARITIN) tablet 10 mg, 10 mg, Oral, Daily PRN, Athena Masse, MD   melatonin tablet 5 mg, 5 mg, Oral, QHS PRN, Sharion Settler, NP, 5 mg at 03/20/22 0023   pantoprazole (PROTONIX) EC tablet 40 mg, 40 mg, Oral, Daily, Judd Gaudier V, MD, 40 mg at 03/20/22 0945   sodium chloride (OCEAN) 0.65 % nasal spray 1 spray, 1 spray, Each Nare, PRN, Athena Masse, MD, 1 spray at 03/20/22 0522   tamsulosin (FLOMAX) capsule 0.4 mg, 0.4 mg, Oral, Daily, Judd Gaudier V, MD, 0.4 mg at 03/19/22 2157  Vitals   Vitals:   03/19/22 2144 03/19/22 2327 03/20/22 0333 03/20/22 0810  BP: (!) 153/76 126/61 110/64 133/67  Pulse: 66 74 67 72  Resp: 17 17  17   Temp: 97.9 F (36.6 C) 98.4 F (36.9 C) 98.2 F (36.8 C) 97.7 F (36.5 C)  TempSrc: Oral Oral Oral Oral  SpO2: 100% 100% 96% 97%  Weight: 75.5 kg     Height: 5' 11"  (1.803 m)        Body mass index is 23.21 kg/m.  Physical Exam   Physical Exam Gen: A&O x4, NAD HEENT: Atraumatic, normocephalic;mucous membranes moist; oropharynx clear, tongue without atrophy or fasciculations. Neck: Supple, trachea midline. Resp: CTAB, no w/r/r CV: RRR, no m/g/r; nml S1 and S2. 2+ symmetric peripheral pulses. Abd: soft/NT/ND; nabs x 4 quad Extrem: Nml bulk; no cyanosis, clubbing, or edema.  Neuro: *MS: A&O x4. Follows multi-step commands.  *Speech: fluid, nondysarthric, able to name and repeat *CN:    I: Deferred   II,III: PERRLA, VFF by confrontation, optic discs unable to be visualized 2/2 pupillary constriction   III,IV,VI: EOMI w/o nystagmus, no ptosis   V: numb to LT on L   VII: Eyelid closure was full.  Smile symmetric.   VIII: Hearing  intact to voice   IX,X: Voice normal, palate elevates symmetrically    XI: SCM/trap 5/5 bilat   XII: Tongue protrudes midline, no atrophy or fasciculations   *Motor:   Normal bulk.  No tremor, rigidity or bradykinesia. No pronator drift.    Strength: Dlt Bic Tri WrE WrF FgS Gr HF KnF KnE PlF DoF    Left 5 5 5 5 5 5 5 5 5 5 5 5     Right 5 5 5 5 5 5 5 5 5 5 5 5     *Sensory: Impaired to PP LUE, otherwise intact *Coordination:  Finger-to-nose, heel-to-shin, rapid alternating motions were intact. *Reflexes:  2+ and symmetric throughout without clonus; toes down-going bilat *Gait: deferred  NIHSS = 1 for sensory deficit   Premorbid mRS = 0   Labs   CBC:  Recent Labs  Lab 03/19/22 1530  WBC 8.1  NEUTROABS 5.9  HGB 12.8*  HCT  36.4*  MCV 94.3  PLT 412    Basic Metabolic Panel:  Lab Results  Component Value Date   NA 131 (L) 03/19/2022   K 4.1 03/19/2022   CO2 23 03/19/2022   GLUCOSE 116 (H) 03/19/2022   BUN 11 03/19/2022   CREATININE 0.71 03/19/2022   CALCIUM 9.2 03/19/2022   GFRNONAA >60 03/19/2022   GFRAA >60 11/16/2019   Lipid Panel:  Lab Results  Component Value Date   LDLCALC 60 03/20/2022   HgbA1c:  Lab Results  Component Value Date   HGBA1C 5.6 03/19/2022   Urine Drug Screen: No results found for: "LABOPIA", "COCAINSCRNUR", "LABBENZ", "AMPHETMU", "THCU", "LABBARB"  Alcohol Level No results found for: "ETH"   Impression   This is a 76 year old gentleman with past medical history significant for CAD, hypertension, hyperlipidemia, hypothyroidism, ulcerative colitis in remission, CAD on medical management, ischemic cardiomyopathy last ejection fraction 40% on echo in 2022, history of left ventricular thrombus last visualized in 2018 previously on warfarin discontinued 2 poor tolerance (last evaluated by his cardiologist Dr. 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  Recommendations   - Permissive HTN x48 hrs from sx onset goal BP <220/110. PRN labetalol or hydralazine if BP above these parameters. Avoid oral antihypertensives. - CTA H&N - carotid US insufficient 2/2 posterior circulation stroke - TTE w/ bubble - Continue home ASA 30m daily + plavix 720mdaily - Continue atorvastatin 8077maily - q4 hr neuro checks - STAT head CT for any change in neuro exam - Tele - PT/OT/SLP - Stroke education - Amb referral to neurology upon discharge   ______________________________________________________________________   Thank you for the opportunity to take part in the care of this patient. If you have any further questions, please contact the neurology consultation attending.  Signed,  ColSu MonksD [facility] [phone] 7pm- 7am, please page neurology on call as listed in [facility]

## 2022-03-20 NOTE — Discharge Summary (Signed)
Physician Discharge Summary   Patient: David [name] MRN: [id number] DOB: 10/08/1946  Admit date:     03/19/2022  Discharge date: 03/20/22  Discharge Physician: Max Sane   PCP: Kirk Ruths, MD   Recommendations at discharge:   Follow-up with outpatient providers as requested  Discharge Diagnoses: Principal Problem:   Cerebrovascular accident (CVA) ([place]) Active Problems:   CAD (coronary artery disease)   Left ventricular apical thrombus   Ulcerative colitis in remission ([place])   Hypertension, essential   Ischemic cardiomyopathy   Chronic systolic CHF secondary to ischemic cardiomyopathy (congestive heart failure) ([place])   Controlled type 2 diabetes mellitus with complication, without long-term current use of insulin ([place])   Carpal tunnel syndrome   Hospital Course: Assessment and Plan: * Cerebrovascular accident (CVA) ([place]) MRI confirmed a small acute or subacute infarct in the right thalamus  Continue aspirin, Plavix and increase atorvastatin 40-> 80 mg (high intensity statin) Outpatient neurology follow-up.  Patient does not have any residual deficit CTA head and neck shows no large vessel occlusion, carotid Doppler shows no hemodynamically significant stenosis Echo shows no thrombus.  EF 50 to 55%  Carpal tunnel syndrome Continue gabapentin Outpatient follow-up with neurology  Controlled type 2 diabetes mellitus with complication, without long-term current use of insulin ([facility]) Chronic systolic CHF secondary to ischemic cardiomyopathy (congestive heart failure) ([facility]) Currently euvolemic  Hypertension, essential Ulcerative colitis in remission ([place]) Left ventricular apical thrombus Stable and is followed by cardiologist, Dr. Ubaldo Glassing last seen in April 2023 Patient previously on warfarin but did not tolerate it well Follow-up echocardiogram this admission does not show thrombus  CAD (coronary artery disease) History of MI with residual left ventricular  thrombus No complaints of chest pain and EKG nonacute Continue aspirin, Plavix, carvedilol and atorvastatin         Consultants: Neurology Disposition: Home Diet recommendation:  Discharge Diet Orders (From admission, onward)     Start     Ordered   03/20/22 0000  Diet - low sodium heart healthy        03/20/22 1543           Carb modified diet DISCHARGE MEDICATION: Allergies as of 03/20/2022       Reactions   Mesalamine Nausea And Vomiting, Nausea Only   Has used since this was noted (12/26/21 cn)        Medication List     STOP taking these medications    D 5000 125 MCG (5000 UT) capsule Generic drug: Cholecalciferol       TAKE these medications    Accu-Chek Aviva Plus test strip Generic drug: glucose blood 1 each by Other route as needed for other.   Accu-Chek Softclix Lancets lancets USE 2 TIMES DAILY. USE AS INSTRUCTED.   acyclovir 400 MG tablet Commonly known as: ZOVIRAX Take 400 mg by mouth 3 (three) times daily.   aspirin EC 81 MG tablet Take 81 mg by mouth in the morning and at bedtime.   atorvastatin 80 MG tablet Commonly known as: LIPITOR Take 1 tablet (80 mg total) by mouth daily. What changed:  medication strength how much to take when to take this   betamethasone dipropionate 0.05 % cream APPLY TO AFFECTED AREA TWICE A DAY   carvedilol 3.125 MG tablet Commonly known as: COREG Take 3.125 mg by mouth 2 (two) times daily with a meal.   CENTRUM SILVER 50+MEN PO Take 1 tablet by mouth daily.   PRESERVISION AREDS 2 PO Take by mouth.  clopidogrel 75 MG tablet Commonly known as: PLAVIX Take 75 mg by mouth at bedtime.   gabapentin 300 MG capsule Commonly known as: NEURONTIN Take 300 mg by mouth daily in the afternoon.   glipiZIDE 10 MG tablet Commonly known as: GLUCOTROL Take 10 mg by mouth 2 (two) times daily.   levothyroxine 75 MCG tablet Commonly known as: SYNTHROID Take 75 mcg by mouth daily before breakfast.    Omega 3 1200 MG Caps Take by mouth.   omeprazole 20 MG capsule Commonly known as: PRILOSEC Take 20 mg by mouth in the morning.   prednisoLONE acetate 1 % ophthalmic suspension Commonly known as: PRED FORTE Administer 1 drop to the right eye four (4) times a day.   PROBIOTIC DAILY PO Take 1 capsule by mouth daily.   tadalafil 5 MG tablet Commonly known as: CIALIS Take 1-3 tablets (5-15 mg total) by mouth daily as needed for erectile dysfunction.   tamsulosin 0.4 MG Caps capsule Commonly known as: FLOMAX Take 1 capsule (0.4 mg total) by mouth daily.   traMADol 50 MG tablet Commonly known as: ULTRAM Take 50-100 mg by mouth daily in the afternoon.   valACYclovir 500 MG tablet Commonly known as: VALTREX Take 1 tablet by mouth 2 (two) times daily.   vedolizumab 300 MG injection Commonly known as: ENTYVIO Inject into the vein. Every 8 weeks   vitamin B-12 500 MCG tablet Commonly known as: CYANOCOBALAMIN Take 500 mcg by mouth daily.        Follow-up Information     Kirk Ruths, MD. Schedule an appointment as soon as possible for a visit in 1 week(s).   Specialty: Internal Medicine Why: [facility] Discharge F/UP Contact information: [place] [address] [phone]         Anabel Bene, MD. Schedule an appointment as soon as possible for a visit in 2 week(s).   Specialty: Neurology Why: [facility] Discharge F/UP Contact information: [place] [facility] [place] [address] [phone]                Discharge Exam: Danley Danker Weights   03/19/22 2144  Weight: 75.5 kg   Constitutional:      General: He is not in acute distress. HENT:     Head: Normocephalic and atraumatic.  Cardiovascular:     Rate and Rhythm: Normal rate and regular rhythm.     Heart sounds: Normal heart sounds.  Pulmonary:     Effort: Pulmonary effort is normal.     Breath  sounds: Normal breath sounds.  Abdominal:     Palpations: Abdomen is soft.     Tenderness: There is no abdominal tenderness.  Neurological:     Mental Status: Mental status is at baseline.  Nonfocal he does have some numbness on his hand which is likely from carpal tunnel.  He also has some facial numbness which is likely from parotid surgery in the past  Condition at discharge: good  The results of significant diagnostics from this hospitalization (including imaging, microbiology, ancillary and laboratory) are listed below for reference.   Imaging Studies: CT ANGIO HEAD NECK W WO CM  Result Date: 03/20/2022 CLINICAL DATA:  Neuro deficit, acute, stroke suspected. Left arm numbness. EXAM: CT ANGIOGRAPHY HEAD AND NECK TECHNIQUE: Multidetector CT imaging of the head and neck was performed using the standard protocol during bolus administration of intravenous contrast. Multiplanar CT image reconstructions and MIPs were obtained  to evaluate the vascular anatomy. Carotid stenosis measurements (when applicable) are obtained utilizing NASCET criteria, using the distal internal carotid diameter as the denominator. RADIATION DOSE REDUCTION: This exam was performed according to the departmental dose-optimization program which includes automated exposure control, adjustment of the mA and/or kV according to patient size and/or use of iterative reconstruction technique. CONTRAST:  15m OMNIPAQUE IOHEXOL 350 MG/ML SOLN COMPARISON:  Head CT and MRI 03/19/2022. Carotid Doppler ultrasound 03/20/2022. FINDINGS: CT HEAD FINDINGS Brain: A subcentimeter hypodensity laterally in the right thalamus corresponds to an acute infarct on MRI. No acute cortically based infarct, intracranial hemorrhage, mass, midline shift, or extra-axial fluid collection is identified. The ventricles and sulci are within normal limits for age. Vascular: Calcified atherosclerosis at the skull base. Skull: No fracture or suspicious osseous lesion.  Sinuses: Visualized paranasal sinuses and mastoid air cells are clear. Orbits: Unremarkable. Review of the MIP images confirms the above findings CTA NECK FINDINGS Aortic arch: Standard 3 vessel aortic arch with mild atherosclerotic plaque. Widely patent arch vessel origins. Right carotid system: Patent with a small to moderate amount of predominantly calcified plaque at the carotid bifurcation. No evidence of a significant stenosis or dissection. Left carotid system: Patent with a small to moderate amount of predominantly calcified plaque at the carotid bifurcation. No evidence of a significant stenosis or dissection. Vertebral arteries: Patent without evidence of stenosis or dissection. Dominant left vertebral artery. Skeleton: Mild disc and advanced facet degeneration in the cervical spine. Other neck: Left parotidectomy and neck dissection. Upper chest: Clear lung apices. Review of the MIP images confirms the above findings CTA HEAD FINDINGS Anterior circulation: The internal carotid arteries are patent from skull base to carotid termini with mild atherosclerosis bilaterally not resulting in significant stenosis. ACAs and MCAs are patent without evidence of a proximal branch occlusion or significant proximal stenosis. No aneurysm is identified. Posterior circulation: The intracranial vertebral arteries are patent to the basilar with the left being strongly dominant. A small amount of calcified plaque is noted in the proximal right V4 segment without significant stenosis. Patent PICA and SCA origins are seen bilaterally. The basilar artery is widely patent. There are robust posterior communicating arteries bilaterally with hypoplasia of the left P1 segment. Both PCAs are patent without evidence of a significant proximal stenosis. No aneurysm is identified. Venous sinuses: Patent. Anatomic variants: Predominantly fetal origin of the left PCA. Review of the MIP images confirms the above findings IMPRESSION: 1. Mild  atherosclerosis in the head and neck without large vessel occlusion or significant proximal stenosis. 2. Known acute lacunar infarct in the right thalamus. No evidence of new infarct or intracranial hemorrhage. 3. Aortic Atherosclerosis (ICD10-I70.0). These results were communicated to Dr. SQuinn Axeat 3:35 pm on 03/20/2022 by text page via the [facility]aging system. Electronically Signed   By: ALogan BoresM.D.   On: 03/20/2022 15:35   ECHOCARDIOGRAM COMPLETE  Result Date: 03/20/2022    ECHOCARDIOGRAM REPORT   Patient Name:   David HICKELDate of Exam: 03/20/2022 Medical Rec #:  [id number]        Height:       71.0 in Accession #:    [id number]       Weight:       166.4 lb Date of Birth:  [birth year]       BSA:          1.950 m Patient Age:    89years  BP:           110/64 mmHg Patient Gender: M                 HR:           69 bpm. Exam Location:  [facility] Procedure: 2D Echo, Color Doppler, Cardiac Doppler and Intracardiac            Opacification Agent Indications:     I63.9 Stroke  History:         Patient has no prior history of Echocardiogram examinations.                  CHF and Ischemic cardiomyopathy, CAD; Risk Factors:Dyslipidemia                  and Diabetes.  Sonographer:     Charmayne Sheer Referring Phys:  [place] Diagnosing Phys: [place]  1. Left ventricular ejection fraction, by estimation, is 50 to 55%. The left ventricle has low normal function. The left ventricle has no regional wall motion abnormalities. There is mild concentric left ventricular hypertrophy. Left ventricular diastolic parameters are consistent with Grade I diastolic dysfunction (impaired relaxation).  2. Right ventricular systolic function is normal. The right ventricular size is normal.  3. Left atrial size was mildly dilated.  4. The mitral valve is normal in structure. Mild mitral valve regurgitation. No evidence of mitral stenosis.  5. The aortic valve is normal in structure. Aortic valve  regurgitation is not visualized. Aortic valve sclerosis is present, with no evidence of aortic valve stenosis.  6. The inferior vena cava is normal in size with greater than 50% respiratory variability, suggesting right atrial pressure of 3 mmHg. FINDINGS  Left Ventricle: Left ventricular ejection fraction, by estimation, is 50 to 55%. The left ventricle has low normal function. The left ventricle has no regional wall motion abnormalities. Definity contrast agent was given IV to delineate the left ventricular endocardial borders. The left ventricular internal cavity size was normal in size. There is mild concentric left ventricular hypertrophy. Left ventricular diastolic parameters are consistent with Grade I diastolic dysfunction (impaired relaxation). Right Ventricle: The right ventricular size is normal. No increase in right ventricular wall thickness. Right ventricular systolic function is normal. Left Atrium: Left atrial size was mildly dilated. Right Atrium: Right atrial size was normal in size. Pericardium: There is no evidence of pericardial effusion. Mitral Valve: The mitral valve is normal in structure. Mild mitral valve regurgitation. No evidence of mitral valve stenosis. MV peak gradient, 3.3 mmHg. The mean mitral valve gradient is 1.0 mmHg. Tricuspid Valve: The tricuspid valve is normal in structure. Tricuspid valve regurgitation is mild . No evidence of tricuspid stenosis. Aortic Valve: The aortic valve is normal in structure. Aortic valve regurgitation is not visualized. Aortic valve sclerosis is present, with no evidence of aortic valve stenosis. Aortic valve mean gradient measures 2.0 mmHg. Aortic valve peak gradient measures 4.1 mmHg. Aortic valve area, by VTI measures 2.79 cm. Pulmonic Valve: The pulmonic valve was normal in structure. Pulmonic valve regurgitation is trivial. No evidence of pulmonic stenosis. Aorta: The aortic root is normal in size and structure. Venous: The inferior vena cava  is normal in size with greater than 50% respiratory variability, suggesting right atrial pressure of 3 mmHg. IAS/Shunts: No atrial level shunt detected by color flow Doppler.  LEFT VENTRICLE PLAX 2D LVIDd:         4.22 cm   Diastology LVIDs:  3.14 cm   LV e' medial:    6.53 cm/s LV PW:         1.20 cm   LV E/e' medial:  10.0 LV IVS:        1.06 cm   LV e' lateral:   7.07 cm/s LVOT diam:     2.10 cm   LV E/e' lateral: 9.3 LV SV:         54 LV SV Index:   28 LVOT Area:     3.46 cm  RIGHT VENTRICLE RV Basal diam:  3.52 cm RV S prime:     9.85 cm/s LEFT ATRIUM           Index        RIGHT ATRIUM           Index LA diam:      3.20 cm 1.64 cm/m   RA Area:     17.60 cm LA Vol (A2C): 49.2 ml 25.23 ml/m  RA Volume:   44.10 ml  22.61 ml/m  AORTIC VALVE                    PULMONIC VALVE AV Area (Vmax):    2.64 cm     PV Vmax:       1.03 m/s AV Area (Vmean):   2.65 cm     PV Vmean:      69.000 cm/s AV Area (VTI):     2.79 cm     PV VTI:        0.215 m AV Vmax:           101.00 cm/s  PV Peak grad:  4.2 mmHg AV Vmean:          72.800 cm/s  PV Mean grad:  2.0 mmHg AV VTI:            0.194 m AV Peak Grad:      4.1 mmHg AV Mean Grad:      2.0 mmHg LVOT Vmax:         77.00 cm/s LVOT Vmean:        55.800 cm/s LVOT VTI:          0.156 m LVOT/AV VTI ratio: 0.80  AORTA Ao Root diam: 3.60 cm MITRAL VALVE MV Area (PHT): 4.24 cm    SHUNTS MV Area VTI:   2.48 cm    Systemic VTI:  0.16 m MV Peak grad:  3.3 mmHg    Systemic Diam: 2.10 cm MV Mean grad:  1.0 mmHg MV Vmax:       0.91 m/s MV Vmean:      57.0 cm/s MV Decel Time: 179 msec MV E velocity: 65.60 cm/s MV A velocity: 99.80 cm/s MV E/A ratio:  0.66 Shaukat Khan Electronically signed by Neoma Laming Signature Date/Time: 03/20/2022/1:09:45 PM    Final    US Carotid Bilateral (at [facility] and [facility] only)  Result Date: 03/20/2022 CLINICAL DATA:  76 year old male with history of stroke. EXAM: BILATERAL CAROTID DUPLEX ULTRASOUND TECHNIQUE: Pearline Cables scale imaging, color Doppler and duplex  ultrasound were performed of bilateral carotid and vertebral arteries in the neck. COMPARISON:  None Available. FINDINGS: Criteria: Quantification of carotid stenosis is based on velocity parameters that correlate the residual internal carotid diameter with NASCET-based stenosis levels, using the diameter of the distal internal carotid lumen as the denominator for stenosis measurement. The following velocity measurements were obtained: RIGHT ICA: Peak systolic velocity 903 cm/sec, End diastolic velocity 29 cm/sec  CCA: Peak systolic velocity 144 cm/sec SYSTOLIC ICA/CCA RATIO:  1.0 ECA: Peak systolic velocity 315 cm/sec LEFT ICA: Peak systolic velocity 400 cm/sec, End diastolic velocity 40 cm/sec CCA: 97 cm/sec SYSTOLIC ICA/CCA RATIO:  1.5 ECA: 96 cm/sec RIGHT CAROTID ARTERY: No atherosclerotic plaque formation. No significant tortuosity. Normal low resistance waveforms. RIGHT VERTEBRAL ARTERY:  Antegrade flow. LEFT CAROTID ARTERY: Mild focal atherosclerotic plaque formation about the proximal internal carotid artery. No significant tortuosity. Normal low resistance waveforms. LEFT VERTEBRAL ARTERY:  Antegrade flow. Upper extremity non-invasive blood pressures: Not obtained. IMPRESSION: 1. Right carotid artery system: Patent without significant atherosclerotic plaque formation. 2. Left carotid artery system: Less than 50% stenosis secondary to mild atherosclerotic plaque formation in the proximal internal carotid artery. 3.  Vertebral artery system: Patent with antegrade flow bilaterally. Ruthann Cancer, MD [facility] Radiology Electronically Signed   By: Ruthann Cancer M.D.   On: 03/20/2022 09:30   MR BRAIN WO CONTRAST  Result Date: 03/19/2022 CLINICAL DATA:  Left arm numbness and sensation change, stroke suspected EXAM: MRI HEAD WITHOUT CONTRAST TECHNIQUE: Multiplanar, multiecho pulse sequences of the brain and surrounding structures were obtained without intravenous  contrast. COMPARISON:  No prior MRI, correlation is made with CT head 03/19/2022 FINDINGS: Brain: Small area of restricted diffusion with ADC correlate in the right thalamus (series 5, image 23 and series 6, image 23 and series 7, image 18), which measures up to 7 mm, is associated with mildly increased T2 hyperintense signal, and is most likely an acute or subacute infarct. No acute hemorrhage, mass, mass effect, or midline shift. No hydrocephalus or extra-axial collection. Vascular: Normal flow voids. Skull and upper cervical spine: Normal marrow signal. Sinuses/Orbits: No acute finding. Other: Trace fluid in the left mastoid air cells. IMPRESSION: Small acute or subacute infarct in the right thalamus which correlates with the hypodensity on the same-day CT. These results were called by telephone at the time of interpretation on 03/19/2022 at 7:59 pm to provider Merlyn Lot , who verbally acknowledged these results. Electronically Signed   By: Merilyn Baba M.D.   On: 03/19/2022 19:59   CT HEAD WO CONTRAST  Result Date: 03/19/2022 CLINICAL DATA:  Neuro deficit, acute, stroke suspected EXAM: CT HEAD WITHOUT CONTRAST TECHNIQUE: Contiguous axial images were obtained from the base of the skull through the vertex without intravenous contrast. RADIATION DOSE REDUCTION: This exam was performed according to the departmental dose-optimization program which includes automated exposure control, adjustment of the mA and/or kV according to patient size and/or use of iterative reconstruction technique. COMPARISON:  None Available. FINDINGS: Brain: Age indeterminate small lacunar infarct in the right thalamus. No evidence of acute hemorrhage, mass lesion, midline shift, or hydrocephalus. Vascular: No hyperdense vessel identified. Calcific intracranial atherosclerosis. Skull: No acute fracture. Sinuses/Orbits: Clear sinuses.  No acute orbital findings. Other: No mastoid effusions. IMPRESSION: Age indeterminate small lacunar  infarct in the right thalamus. An MRI could further evaluate for acute infarct. Electronically Signed   By: Margaretha Sheffield M.D.   On: 03/19/2022 15:43    Microbiology: Results for orders placed or performed during the hospital encounter of 07/29/21  Resp Panel by RT-PCR (Flu A&B, Covid) Nasopharyngeal Swab     Status: Abnormal   Collection Time: 07/29/21 10:33 AM   Specimen: Nasopharyngeal Swab; Nasopharyngeal(NP) swabs in vial transport medium  Result Value Ref Range Status   SARS Coronavirus 2 by RT PCR POSITIVE (A) NEGATIVE Final    Comment: RESULT CALLED TO, READ BACK  BY AND VERIFIED WITH: TINA RAGER [id number] Jul 29 2021/SFW (NOTE) SARS-CoV-2 target nucleic acids are DETECTED.  The SARS-CoV-2 RNA is generally detectable in upper respiratory specimens during the acute phase of infection. Positive results are indicative of the presence of the identified virus, but do not rule out bacterial infection or co-infection with other pathogens not detected by the test. Clinical correlation with patient history and other diagnostic information is necessary to determine patient infection status. The expected result is Negative.  Fact Sheet for Patients: EntrepreneurPulse.com.au  Fact Sheet for Healthcare Providers: IncredibleEmployment.be  This test is not yet approved or cleared by the [place] FDA and  has been authorized for detection and/or diagnosis of SARS-CoV-2 by FDA under an Emergency Use Authorization (EUA).  This EUA will remain in effect (meaning this test can be  used) for the duration of  the COVID-19 declaration under Section 564(b)(1) of the Act, 21 U.S.C. section 360bbb-3(b)(1), unless the authorization is terminated or revoked sooner.     Influenza A by PCR NEGATIVE NEGATIVE Final   Influenza B by PCR NEGATIVE NEGATIVE Final    Comment: (NOTE) The Xpert Xpress SARS-CoV-2/FLU/RSV plus assay is intended as an aid in the  diagnosis of influenza from Nasopharyngeal swab specimens and should not be used as a sole basis for treatment. Nasal washings and aspirates are unacceptable for Xpert Xpress SARS-CoV-2/FLU/RSV testing.  Fact Sheet for Patients: EntrepreneurPulse.com.au  Fact Sheet for Healthcare Providers: IncredibleEmployment.be  This test is not yet approved or cleared by the [place] FDA and has been authorized for detection and/or diagnosis of SARS-CoV-2 by FDA under an Emergency Use Authorization (EUA). This EUA will remain in effect (meaning this test can be used) for the duration of the COVID-19 declaration under Section 564(b)(1) of the Act, 21 U.S.C. section 360bbb-3(b)(1), unless the authorization is terminated or revoked.  Performed at [facility], [address], [place], [place] [address]     Labs: CBC: Recent Labs  Lab 03/19/22 1530  WBC 8.1  NEUTROABS 5.9  HGB 12.8*  HCT 36.4*  MCV 94.3  PLT 223   Basic Metabolic Panel: Recent Labs  Lab 03/19/22 1530  NA 131*  K 4.1  CL 101  CO2 23  GLUCOSE 116*  BUN 11  CREATININE 0.71  CALCIUM 9.2   Liver Function Tests: Recent Labs  Lab 03/19/22 1530  AST 26  ALT 24  ALKPHOS 70  BILITOT 1.0  PROT 7.0  ALBUMIN 4.0   CBG: Recent Labs  Lab 03/19/22 2141 03/20/22 0934 03/20/22 1226  GLUCAP 87 123* 214*    Discharge time spent: greater than 30 minutes.  Signed: Max Sane, MD [facility] 03/20/2022

## 2022-03-20 NOTE — Evaluation (Signed)
Physical Therapy Evaluation Patient Details Name: David [name] MRN: [id number] DOB: 15-May-1946 Today's Date: 03/20/2022  History of Present Illness  David [name] is a 76 y.o. 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 MRI confirmed a small acute or subacute infarct in the right thalamus.  Clinical Impression  The pt is presenting with some L hand dexterity impairments as well as some agnosia. He is ambulating at his baseline, although the pt states that he normally ambulates faster. At this time he presents with no overt skilled inpatient PT needs. The pt was educated on the benefits of outpatient PT for conservative management of back pain pain. He would greatly benefit from outpatient OT services to address the coordination, strength,and sensation deficits in the L hand.      Recommendations for follow up therapy are one component of a multi-disciplinary discharge planning process, led by the attending physician.  Recommendations may be updated based on patient status, additional functional criteria and insurance authorization.  Follow Up Recommendations Outpatient PT (OPPT to address PMHx of back pain, if patient refuses he is still safe to d/c home.)    Assistance Recommended at Discharge PRN  Patient can return home with the following       Equipment Recommendations    Recommendations for Other Services       Functional Status Assessment Patient has not had a recent decline in their functional status     Precautions / Restrictions Precautions Precautions: Fall Restrictions Weight Bearing Restrictions: No      Mobility  Bed Mobility Overal bed mobility: Independent                  Transfers Overall transfer level: Independent Equipment used: None                     Ambulation/Gait Ambulation/Gait assistance: Independent Gait Distance (Feet): 250 Feet Assistive device: None Gait Pattern/deviations: Step-through pattern Gait velocity: Pt slower at first but after he "stretched out" he demonstrated improved gait speeds. Gait velocity interpretation: 1.31 - 2.62 ft/sec, indicative of limited community Social research officer, government Rankin (Stroke Patients Only)       Balance Overall balance assessment: No apparent balance deficits (not formally assessed)                                           Pertinent Vitals/Pain Pain Assessment Pain Assessment: No/denies pain    Home Living Family/patient expects to be discharged to:: Private residence Living Arrangements: Spouse/significant other Available Help at Discharge: Family Type of Home: House Home Access: Stairs to enter Entrance Stairs-Rails: Right Entrance Stairs-Number of Steps: 2 Alternate Level Stairs-Number of Steps: Does not access 2nd level Home Layout: Two level;Able to live on main level with bedroom/bathroom        Prior Function Prior Level of Function : Independent/Modified Independent             Mobility Comments: Reports his back pain is his greatest limiting factor for mobility, PMHx significant for DDD. Educated on outpatient PT for conservative management. Pt reporting only able to  participate in some iADL activities x10 min prior to needing a break at baseline. ADLs Comments: Was mowing his lawn prior to admission     Hand Dominance   Dominant Hand: Right    Extremity/Trunk Assessment   Upper Extremity Assessment Upper Extremity Assessment: LUE deficits/detail LUE Sensation: decreased light touch (Pt presenting with some L hand agnosia.)    Lower Extremity Assessment Lower Extremity Assessment: Overall WFL for tasks assessed    Cervical / Trunk Assessment Cervical / Trunk Assessment:  Kyphotic  Communication   Communication: No difficulties  Cognition Arousal/Alertness: Awake/alert Behavior During Therapy: WFL for tasks assessed/performed Overall Cognitive Status: Within Functional Limits for tasks assessed                                          General Comments      Exercises     Assessment/Plan    PT Assessment Patient does not need any further PT services  PT Problem List         PT Treatment Interventions      PT Goals (Current goals can be found in the Care Plan section)  Acute Rehab PT Goals Patient Stated Goal: to get home PT Goal Formulation: With patient Time For Goal Achievement: 04/03/22 Potential to Achieve Goals: Good    Frequency       Co-evaluation               AM-PAC PT "6 Clicks" Mobility  Outcome Measure Help needed turning from your back to your side while in a flat bed without using bedrails?: None Help needed moving from lying on your back to sitting on the side of a flat bed without using bedrails?: None Help needed moving to and from a bed to a chair (including a wheelchair)?: None Help needed standing up from a chair using your arms (e.g., wheelchair or bedside chair)?: None Help needed to walk in hospital room?: None Help needed climbing 3-5 steps with a railing? : A Little 6 Click Score: 23    End of Session   Activity Tolerance: Patient tolerated treatment well Patient left: in chair Nurse Communication: Mobility status PT Visit Diagnosis: Muscle weakness (generalized) (M62.81)    Time: 4010-2725 PT Time Calculation (min) (ACUTE ONLY): 27 min   Charges:   PT Evaluation $PT Eval Low Complexity: 1 Low PT Treatments $Gait Training: 8-22 mins        11:43 AM, 03/20/22 David [name] PT, DPT Physical Therapist - [facility]   David [name] 03/20/2022, 11:38 AM

## 2022-03-26 DIAGNOSIS — Z8673 Personal history of transient ischemic attack (TIA), and cerebral infarction without residual deficits: Secondary | ICD-10-CM | POA: Insufficient documentation

## 2022-04-10 ENCOUNTER — Other Ambulatory Visit: Payer: Self-pay | Admitting: Urology

## 2022-04-16 ENCOUNTER — Other Ambulatory Visit: Payer: Self-pay

## 2022-04-16 ENCOUNTER — Ambulatory Visit: Payer: Medicare Other | Admitting: Gastroenterology

## 2022-04-16 ENCOUNTER — Encounter: Payer: Self-pay | Admitting: Gastroenterology

## 2022-04-16 VITALS — BP 150/76 | HR 66 | Temp 97.6°F | Wt 168.2 lb

## 2022-04-16 DIAGNOSIS — Z0000 Encounter for general adult medical examination without abnormal findings: Secondary | ICD-10-CM | POA: Diagnosis not present

## 2022-04-16 DIAGNOSIS — K5100 Ulcerative (chronic) pancolitis without complications: Secondary | ICD-10-CM

## 2022-04-16 DIAGNOSIS — Z2821 Immunization not carried out because of patient refusal: Secondary | ICD-10-CM | POA: Diagnosis not present

## 2022-04-16 DIAGNOSIS — Z1211 Encounter for screening for malignant neoplasm of colon: Secondary | ICD-10-CM | POA: Diagnosis not present

## 2022-04-16 NOTE — Progress Notes (Signed)
[name] MD, MRCP(U.K) [address]  [place], [place] [address]  Main: [phone]  Fax: [phone]   Primary Care Physician: Kirk Ruths, MD  Primary Gastroenterologist:  Dr. [name]   Chief Complaint  Patient presents with   Ulcerative Colitis    HPI: David [name] is a 76 y.o. male   Summary of history :  He has had ulcerative colitis from 2007 . 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 Remicaid d/c in 2009 due to the malignant skin lesions. Since 2009  He was being followed by [facility] till 2015 when his doctor retired. Hospitalized in 2011 for flare of the colitis. His symptoms returned in early December 2017  when I took over his care .  Sigmoidoscopy demonstrated severe left-sided colitis.   He was treated with oral steroids with resolution of rectal bleeding and discharged  4 days after discharge he returned to the hospital with severe colitis,readmitted 10/27/16 with ecoli sepsis/bacteriemia/fevers while on prednisone 40 mg .found to have pancolitis on CT scan.  Thrombosis of the inferior mesenteric vein.  We wanted to transfer him to a tertiary center but he refused . He was seen at [facility] Dr Daphane Shepherd who specializes in IBD for a second opinion as he has had prior skin cancer with regards to options. She saw him on 12/01/16.Commenced on remicaid 01/05/17. 05/2017 due to severe reaction to Remicade.Commenced on Entyvio isubsequently .      In the past on multiple occasions  vaccination for hepatitis a and B pneumococcal vaccination has been recommended.  On multiple occasions I have discussed about colon cancer cancer screening, dysplasia surveillance with a colonoscopy and explained the very high risk of colon cancer in his case and strong recommendations to undergo the same but he has refused every single time. 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  I have previously provided him the journal article from inflammatory bowel diseases dated December 18, 2020 by Hong SJ et al  titled "ustekinumab and vedolizumab appears safe to use in patients with inflammatory bowel disease and a history of cancer"    Interval history 07/09/2021-09/10/2021    03/19/2022: LFT''s normal , Hb 12.8 grams 03/19/2022: thalamic stroke, on asprin and plavix  Doing well from the GI point of view 1 bowel movement daily sometimes constipated for up to 3 days no blood in the stool no urgency no cramping no NSAID use.      Allergies as of 04/16/2022 - Review Complete 04/16/2022  Allergen Reaction Noted   Mesalamine Nausea And Vomiting and Nausea Only 12/01/2016    ROS:  General: Negative for anorexia, weight loss, fever, chills, fatigue, weakness. ENT: Negative for hoarseness, difficulty swallowing , nasal congestion. CV: Negative for chest pain, angina, palpitations, dyspnea on exertion, peripheral edema.  Respiratory: Negative for dyspnea at rest, dyspnea on exertion, cough, sputum, wheezing.  GI:  See history of present illness. GU:  Negative for dysuria, hematuria, urinary incontinence, urinary frequency, nocturnal urination.  Endo: Negative for unusual weight change.    Physical Examination:   BP (!) 150/76   Pulse 66   Temp 97.6 F (36.4 C) (Oral)   Wt 168 lb 3.2 oz (76.3 kg)   BMI 23.46 kg/m   General: Well-nourished, well-developed in no acute distress.  Eyes: No icterus. Conjunctivae pink. Mouth: Oropharyngeal mucosa  moist and pink , no lesions erythema or exudate. Neuro: Alert and oriented x 3.  Grossly intact. Skin: Warm and dry, no jaundice.   Psych: Alert and cooperative, normal mood and affect.   Imaging Studies: CT ANGIO HEAD NECK W WO CM  Result Date: 03/20/2022 CLINICAL DATA:  Neuro deficit, acute, stroke suspected. Left arm numbness. EXAM: CT ANGIOGRAPHY HEAD AND NECK TECHNIQUE: Multidetector CT imaging of the head and neck was performed using the standard protocol during bolus administration of intravenous contrast. Multiplanar CT image reconstructions and MIPs were obtained to evaluate the vascular anatomy. Carotid stenosis measurements (when applicable) are obtained utilizing NASCET criteria, using the distal internal carotid diameter as the denominator. RADIATION DOSE REDUCTION: This exam was performed according to the departmental dose-optimization program which includes automated exposure control, adjustment of the mA and/or kV according to patient size and/or use of iterative reconstruction technique. CONTRAST:  15m OMNIPAQUE IOHEXOL 350 MG/ML SOLN COMPARISON:  Head CT and MRI 03/19/2022. Carotid Doppler ultrasound 03/20/2022. FINDINGS: CT HEAD FINDINGS Brain: A subcentimeter hypodensity laterally in the right thalamus corresponds to an acute infarct on MRI. No acute cortically based infarct, intracranial hemorrhage, mass, midline shift, or extra-axial fluid collection is identified. The ventricles and sulci are within normal limits for age. Vascular: Calcified atherosclerosis at the skull base. Skull: No fracture or suspicious osseous lesion. Sinuses: Visualized paranasal sinuses and mastoid air cells are clear. Orbits: Unremarkable. Review of the MIP images confirms the above findings CTA NECK FINDINGS Aortic arch: Standard 3 vessel aortic arch with mild atherosclerotic plaque. Widely patent arch vessel origins. Right carotid system: Patent with a small to moderate amount of predominantly calcified  plaque at the carotid bifurcation. No evidence of a significant stenosis or dissection. Left carotid system: Patent with a small to moderate amount of predominantly calcified plaque at the carotid bifurcation. No evidence of a significant stenosis or dissection. Vertebral arteries: Patent without evidence of stenosis or dissection. Dominant left vertebral artery. Skeleton: Mild disc and advanced facet degeneration in the cervical spine. Other neck: Left parotidectomy and neck dissection. Upper chest: Clear lung apices. Review of the MIP images confirms the above findings CTA HEAD FINDINGS Anterior circulation: The internal carotid arteries are patent from skull base to carotid termini with mild atherosclerosis bilaterally not resulting in significant stenosis. ACAs and MCAs are patent without evidence of a proximal branch occlusion or significant proximal stenosis. No aneurysm is identified. Posterior circulation: The intracranial vertebral arteries are patent to the basilar with the left being strongly dominant. A small amount of calcified plaque is noted in the proximal right V4 segment without significant stenosis. Patent PICA and SCA origins are seen bilaterally. The basilar artery is widely patent. There are robust posterior communicating arteries bilaterally with hypoplasia of the left P1 segment. Both PCAs are patent without evidence of a significant proximal stenosis. No aneurysm is identified. Venous sinuses: Patent. Anatomic variants: Predominantly fetal origin of the left PCA. Review of the MIP images confirms the above findings IMPRESSION: 1. Mild atherosclerosis in the head  and neck without large vessel occlusion or significant proximal stenosis. 2. Known acute lacunar infarct in the right thalamus. No evidence of new infarct or intracranial hemorrhage. 3. Aortic Atherosclerosis (ICD10-I70.0). These results were communicated to Dr. Quinn Axe at 3:35 pm on 03/20/2022 by text page via the [facility] messaging  system. Electronically Signed   By: Logan Bores M.D.   On: 03/20/2022 15:35   ECHOCARDIOGRAM COMPLETE  Result Date: 03/20/2022    ECHOCARDIOGRAM REPORT   Patient Name:   David [name] Date of Exam: 03/20/2022 Medical Rec #:  [id number]         Height:       71.0 in Accession #:    [id number]        Weight:       166.4 lb Date of Birth:  1946/10/09        BSA:          1.950 m Patient Age:    34 years          BP:           110/64 mmHg Patient Gender: M                 HR:           69 bpm. Exam Location:  [facility] Procedure: 2D Echo, Color Doppler, Cardiac Doppler and Intracardiac            Opacification Agent Indications:     I63.9 Stroke  History:         Patient has no prior history of Echocardiogram examinations.                  CHF and Ischemic cardiomyopathy, CAD; Risk Factors:Dyslipidemia                  and Diabetes.  Sonographer:     Charmayne Sheer Referring Phys:  Amelia Diagnosing Phys: [place]  1. Left ventricular ejection fraction, by estimation, is 50 to 55%. The left ventricle has low normal function. The left ventricle has no regional wall motion abnormalities. There is mild concentric left ventricular hypertrophy. Left ventricular diastolic parameters are consistent with Grade I diastolic dysfunction (impaired relaxation).  2. Right ventricular systolic function is normal. The right ventricular size is normal.  3. Left atrial size was mildly dilated.  4. The mitral valve is normal in structure. Mild mitral valve regurgitation. No evidence of mitral stenosis.  5. The aortic valve is normal in structure. Aortic valve regurgitation is not visualized. Aortic valve sclerosis is present, with no evidence of aortic valve stenosis.  6. The inferior vena cava is normal in size with greater than 50% respiratory variability, suggesting right atrial pressure of 3 mmHg. FINDINGS  Left Ventricle: Left ventricular ejection fraction, by estimation, is 50 to 55%. The left ventricle has  low normal function. The left ventricle has no regional wall motion abnormalities. Definity contrast agent was given IV to delineate the left ventricular endocardial borders. The left ventricular internal cavity size was normal in size. There is mild concentric left ventricular hypertrophy. Left ventricular diastolic parameters are consistent with Grade I diastolic dysfunction (impaired relaxation). Right Ventricle: The right ventricular size is normal. No increase in right ventricular wall thickness. Right ventricular systolic function is normal. Left Atrium: Left atrial size was mildly dilated. Right Atrium: Right atrial size was normal in size. Pericardium: There is no evidence of pericardial effusion. Mitral Valve: The mitral valve is normal in  structure. Mild mitral valve regurgitation. No evidence of mitral valve stenosis. MV peak gradient, 3.3 mmHg. The mean mitral valve gradient is 1.0 mmHg. Tricuspid Valve: The tricuspid valve is normal in structure. Tricuspid valve regurgitation is mild . No evidence of tricuspid stenosis. Aortic Valve: The aortic valve is normal in structure. Aortic valve regurgitation is not visualized. Aortic valve sclerosis is present, with no evidence of aortic valve stenosis. Aortic valve mean gradient measures 2.0 mmHg. Aortic valve peak gradient measures 4.1 mmHg. Aortic valve area, by VTI measures 2.79 cm. Pulmonic Valve: The pulmonic valve was normal in structure. Pulmonic valve regurgitation is trivial. No evidence of pulmonic stenosis. Aorta: The aortic root is normal in size and structure. Venous: The inferior vena cava is normal in size with greater than 50% respiratory variability, suggesting right atrial pressure of 3 mmHg. IAS/Shunts: No atrial level shunt detected by color flow Doppler.  LEFT VENTRICLE PLAX 2D LVIDd:         4.22 cm   Diastology LVIDs:         3.14 cm   LV e' medial:    6.53 cm/s LV PW:         1.20 cm   LV E/e' medial:  10.0 LV IVS:        1.06 cm   LV  e' lateral:   7.07 cm/s LVOT diam:     2.10 cm   LV E/e' lateral: 9.3 LV SV:         54 LV SV Index:   28 LVOT Area:     3.46 cm  RIGHT VENTRICLE RV Basal diam:  3.52 cm RV S prime:     9.85 cm/s LEFT ATRIUM           Index        RIGHT ATRIUM           Index LA diam:      3.20 cm 1.64 cm/m   RA Area:     17.60 cm LA Vol (A2C): 49.2 ml 25.23 ml/m  RA Volume:   44.10 ml  22.61 ml/m  AORTIC VALVE                    PULMONIC VALVE AV Area (Vmax):    2.64 cm     PV Vmax:       1.03 m/s AV Area (Vmean):   2.65 cm     PV Vmean:      69.000 cm/s AV Area (VTI):     2.79 cm     PV VTI:        0.215 m AV Vmax:           101.00 cm/s  PV Peak grad:  4.2 mmHg AV Vmean:          72.800 cm/s  PV Mean grad:  2.0 mmHg AV VTI:            0.194 m AV Peak Grad:      4.1 mmHg AV Mean Grad:      2.0 mmHg LVOT Vmax:         77.00 cm/s LVOT Vmean:        55.800 cm/s LVOT VTI:          0.156 m LVOT/AV VTI ratio: 0.80  AORTA Ao Root diam: 3.60 cm MITRAL VALVE MV Area (PHT): 4.24 cm    SHUNTS MV Area VTI:   2.48 cm    Systemic VTI:  0.16  m MV Peak grad:  3.3 mmHg    Systemic Diam: 2.10 cm MV Mean grad:  1.0 mmHg MV Vmax:       0.91 m/s MV Vmean:      57.0 cm/s MV Decel Time: 179 msec MV E velocity: 65.60 cm/s MV A velocity: 99.80 cm/s MV E/A ratio:  0.66 Shaukat Khan Electronically signed by Neoma Laming Signature Date/Time: 03/20/2022/1:09:45 PM    Final    US Carotid Bilateral (at [facility] and [facility] only)  Result Date: 03/20/2022 CLINICAL DATA:  76 year old male with history of stroke. EXAM: BILATERAL CAROTID DUPLEX ULTRASOUND TECHNIQUE: Pearline Cables scale imaging, color Doppler and duplex ultrasound were performed of bilateral carotid and vertebral arteries in the neck. COMPARISON:  None Available. FINDINGS: Criteria: Quantification of carotid stenosis is based on velocity parameters that correlate the residual internal carotid diameter with NASCET-based stenosis levels, using the diameter of the distal internal carotid lumen as the  denominator for stenosis measurement. The following velocity measurements were obtained: RIGHT ICA: Peak systolic velocity 073 cm/sec, End diastolic velocity 29 cm/sec CCA: Peak systolic velocity 710 cm/sec SYSTOLIC ICA/CCA RATIO:  1.0 ECA: Peak systolic velocity 626 cm/sec LEFT ICA: Peak systolic velocity 948 cm/sec, End diastolic velocity 40 cm/sec CCA: 97 cm/sec SYSTOLIC ICA/CCA RATIO:  1.5 ECA: 96 cm/sec RIGHT CAROTID ARTERY: No atherosclerotic plaque formation. No significant tortuosity. Normal low resistance waveforms. RIGHT VERTEBRAL ARTERY:  Antegrade flow. LEFT CAROTID ARTERY: Mild focal atherosclerotic plaque formation about the proximal internal carotid artery. No significant tortuosity. Normal low resistance waveforms. LEFT VERTEBRAL ARTERY:  Antegrade flow. Upper extremity non-invasive blood pressures: Not obtained. IMPRESSION: 1. Right carotid artery system: Patent without significant atherosclerotic plaque formation. 2. Left carotid artery system: Less than 50% stenosis secondary to mild atherosclerotic plaque formation in the proximal internal carotid artery. 3.  Vertebral artery system: Patent with antegrade flow bilaterally. Ruthann Cancer, MD [facility] Electronically Signed   By: Ruthann Cancer M.D.   On: 03/20/2022 09:30   MR BRAIN WO CONTRAST  Result Date: 03/19/2022 CLINICAL DATA:  Left arm numbness and sensation change, stroke suspected EXAM: MRI HEAD WITHOUT CONTRAST TECHNIQUE: Multiplanar, multiecho pulse sequences of the brain and surrounding structures were obtained without intravenous contrast. COMPARISON:  No prior MRI, correlation is made with CT head 03/19/2022 FINDINGS: Brain: Small area of restricted diffusion with ADC correlate in the right thalamus (series 5, image 23 and series 6, image 23 and series 7, image 18), which measures up to 7 mm, is associated with mildly increased T2 hyperintense signal, and is most likely an  acute or subacute infarct. No acute hemorrhage, mass, mass effect, or midline shift. No hydrocephalus or extra-axial collection. Vascular: Normal flow voids. Skull and upper cervical spine: Normal marrow signal. Sinuses/Orbits: No acute finding. Other: Trace fluid in the left mastoid air cells. IMPRESSION: Small acute or subacute infarct in the right thalamus which correlates with the hypodensity on the same-day CT. These results were called by telephone at the time of interpretation on 03/19/2022 at 7:59 pm to provider Merlyn Lot , who verbally acknowledged these results. Electronically Signed   By: Merilyn Baba M.D.   On: 03/19/2022 19:59   CT HEAD WO CONTRAST  Result Date: 03/19/2022 CLINICAL DATA:  Neuro deficit, acute, stroke suspected EXAM: CT HEAD WITHOUT CONTRAST TECHNIQUE: Contiguous axial images were obtained from the base of the skull through the vertex without intravenous contrast. RADIATION DOSE REDUCTION: This exam was performed according to the departmental  dose-optimization program which includes automated exposure control, adjustment of the mA and/or kV according to patient size and/or use of iterative reconstruction technique. COMPARISON:  None Available. FINDINGS: Brain: Age indeterminate small lacunar infarct in the right thalamus. No evidence of acute hemorrhage, mass lesion, midline shift, or hydrocephalus. Vascular: No hyperdense vessel identified. Calcific intracranial atherosclerosis. Skull: No acute fracture. Sinuses/Orbits: Clear sinuses.  No acute orbital findings. Other: No mastoid effusions. IMPRESSION: Age indeterminate small lacunar infarct in the right thalamus. An MRI could further evaluate for acute infarct. Electronically Signed   By: Margaretha Sheffield M.D.   On: 03/19/2022 15:43    Assessment and Plan:   David [name] is a 76 y.o. y/o male  here to follow up . He has a history of long standing ulcerative colitis- off treatment for many years and recent onset of  symptoms in 2017 while off all therapy .Prior history of skin cancer (melanoma and he recalls basal cell ca) in the past Previously colectomy was discussed but he was not keen. Tried Infliximab, cyclosporin in the past which had to be D/c  ,  change dto Entyvio in 05/2017 .  He is due for a tetanus shot, COVID booster, discussed about it.  Previously discussed about a colonoscopy stressed on the need for the same as he is high risk for colon cancer explained to him that colon cancer symptoms may not manifest off until it is spread to various parts of the body.  Advised him if he changes his mind to give me a call    Plan  1.  TB QuantiFERON due in 07/01/2022. 2.  Continue with Entyvio infusions: 3.  Strongly stressed the need and importance of vaccination, colonoscopy for dysplasia surveillance, .  He is aware of the risks of not doing so but is not keen on doing it at this point of time.  He has been explained very clearly that if he changes his mind he should come to my office to schedule the above.    Dr [name]  MD,MRCP [facility]) Follow up in 6 months

## 2022-05-07 ENCOUNTER — Ambulatory Visit
Admission: RE | Admit: 2022-05-07 | Discharge: 2022-05-07 | Disposition: A | Discharge status: Home / Self Care | Payer: Medicare Other | Source: Ambulatory Visit | Attending: Gastroenterology | Admitting: Gastroenterology

## 2022-05-07 DIAGNOSIS — Z01818 Encounter for other preprocedural examination: Secondary | ICD-10-CM | POA: Diagnosis not present

## 2022-05-07 MED ORDER — VEDOLIZUMAB 300 MG IV SOLR
300.0000 mg | Freq: Once | INTRAVENOUS | Status: AC
  Administered 2022-05-07: 300 mg via INTRAVENOUS
  Filled 2022-05-07: qty 5

## 2022-05-13 ENCOUNTER — Ambulatory Visit: Payer: Medicare Other | Admitting: Urology

## 2022-05-13 ENCOUNTER — Encounter: Payer: Self-pay | Admitting: Urology

## 2022-05-13 ENCOUNTER — Other Ambulatory Visit: Payer: Self-pay | Admitting: Urology

## 2022-05-13 VITALS — BP 151/82 | HR 69 | Ht 71.0 in | Wt 162.0 lb

## 2022-05-13 DIAGNOSIS — N529 Male erectile dysfunction, unspecified: Secondary | ICD-10-CM | POA: Diagnosis not present

## 2022-05-13 DIAGNOSIS — N475 Adhesions of prepuce and glans penis: Secondary | ICD-10-CM

## 2022-05-13 MED ORDER — TADALAFIL 5 MG PO TABS: 5.0000 mg | ORAL_TABLET | Freq: Every day | ORAL | 11 refills | Status: DC | PRN

## 2022-05-13 NOTE — Progress Notes (Signed)
   05/13/2022 2:14 PM   David [name] 1946/07/08 [id number]  Reason for visit: Follow up penile adhesions, BPH, ED  HPI: 76 year old male who had significant phimosis with dense circumferential penile adhesions, and underwent takedown of these adhesions with sedation on 01/02/2022.  He was initially having some problems with re-adhesion of the foreskin, but by applying some surgical lube a few times a day this has since resolved.  He is not having any pain with erections at this point and feels he is doing well.  On exam, foreskin retracts easily and no evidence of residual adhesions, raw skin areas have healed nicely and no open tissue noted.  He had a number of questions about circumcision today, and now that he is asymptomatic with no pain with erections or urinary problems from phimosis, I did not recommend any further surgical treatment.  We also reviewed he recently had a stroke and is on dual anticoagulation with aspirin and Plavix, and any circumcision or surgical procedure would require holding these and increase the risk of stroke.  Using over-the-counter beta prostate for his mild urinary symptoms, stopped Flomax previously because of his bothersome side effects.  Regarding his erections, having some improvement on the Cialis 5 mg as needed, but interested in dose adjustment.  We discussed this could be taken up to 20 mg at a time and we reviewed side effects of nasal congestion/headache/muscle aches.  Cialis increased to 5 to 20 mg on demand RTC 1 year symptom check  Billey Co, MD  [place] [address], [place], [place] [address] [phone]

## 2022-07-02 ENCOUNTER — Ambulatory Visit
Admission: RE | Admit: 2022-07-02 | Discharge: 2022-07-02 | Disposition: A | Discharge status: Home / Self Care | Payer: Medicare Other | Source: Ambulatory Visit | Attending: Gastroenterology | Admitting: Gastroenterology

## 2022-07-02 DIAGNOSIS — K5190 Ulcerative colitis, unspecified, without complications: Secondary | ICD-10-CM | POA: Insufficient documentation

## 2022-07-02 MED ORDER — SODIUM CHLORIDE 0.9 % IV SOLN
300.0000 mg | Freq: Once | INTRAVENOUS | Status: AC
  Administered 2022-07-02: 300 mg via INTRAVENOUS
  Filled 2022-07-02: qty 5

## 2022-07-02 MED ORDER — SODIUM CHLORIDE 0.9% FLUSH
10.0000 mL | Freq: Once | INTRAVENOUS | Status: AC
Start: 2022-07-02 — End: 2022-07-02

## 2022-07-02 MED ORDER — SODIUM CHLORIDE FLUSH 0.9 % IV SOLN
INTRAVENOUS | Status: AC
  Administered 2022-07-02: 10 mL via INTRAVENOUS
  Filled 2022-07-02: qty 10

## 2022-07-13 ENCOUNTER — Telehealth: Payer: Self-pay

## 2022-07-13 NOTE — Telephone Encounter (Signed)
Patient patient assistance is expiring the end of December. They will need new patient assistance form filled out for the new year.

## 2022-08-19 ENCOUNTER — Telehealth: Payer: Self-pay

## 2022-08-19 NOTE — Telephone Encounter (Signed)
Called patient and left a message with his wife-Martha. I told her to let him know that I needed to speak with him. She stated that she would do so. We received a letter from [facility] stating that they needed extra documentation stating that patient needed to confirm the total household members and if they contribute to the household income and stating of financial hardship (verbal or written) attesting that they cannot afford the medication even with insurance coverage. The verbal or written statement. Once the patient calls me back, I will let him know to call Entyvio to [phone]

## 2022-08-20 NOTE — Telephone Encounter (Signed)
Patient called me back and I was able to let him know what [facility] was needing from him and he stated that yesterday he had spoken to them and sent them a copy of his wife's income and why they were in financial hardship.  Patient also stated that [facility] had sent him information about the new Entyvio injections given to himself every 2 weeks. He wanted me to mention it the physician so he could tell him more about it on his upcoming appointment on January 2024.

## 2022-08-26 NOTE — Telephone Encounter (Signed)
I will send patient a message through [facility] letting him know the below information.

## 2022-08-27 ENCOUNTER — Ambulatory Visit
Admission: RE | Admit: 2022-08-27 | Discharge: 2022-08-27 | Disposition: A | Discharge status: Home / Self Care | Payer: Medicare Other | Source: Ambulatory Visit | Attending: Gastroenterology | Admitting: Gastroenterology

## 2022-08-27 DIAGNOSIS — K5100 Ulcerative (chronic) pancolitis without complications: Secondary | ICD-10-CM | POA: Diagnosis present

## 2022-08-27 MED ORDER — VEDOLIZUMAB 300 MG IV SOLR
300.0000 mg | Freq: Once | INTRAVENOUS | Status: AC
  Administered 2022-08-27: 300 mg via INTRAVENOUS
  Filled 2022-08-27: qty 5

## 2022-10-22 ENCOUNTER — Other Ambulatory Visit: Payer: Self-pay

## 2022-10-22 ENCOUNTER — Encounter: Payer: Self-pay | Admitting: Gastroenterology

## 2022-10-22 ENCOUNTER — Ambulatory Visit: Payer: Medicare Other | Admitting: Gastroenterology

## 2022-10-22 VITALS — BP 146/78 | HR 68 | Temp 98.0°F | Wt 168.0 lb

## 2022-10-22 DIAGNOSIS — K5100 Ulcerative (chronic) pancolitis without complications: Secondary | ICD-10-CM

## 2022-10-22 NOTE — Progress Notes (Signed)
[name] MD, MRCP(U.K) [address]  [place], [place] [address]  Main: [phone]  Fax: [phone]   Primary Care Physician: Kirk Ruths, MD  Primary Gastroenterologist:  Dr. [name]   Chief Complaint  Patient presents with   Ulcerative Colitis    HPI: David [name] is a 77 y.o. male   Summary of history :   He has had ulcerative colitis from 2007 . 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 Remicaid d/c in 2009 due to the malignant skin lesions. Since 2009  He was being followed by [facility] till 2015 when his doctor retired. Hospitalized in 2011 for flare of the colitis. His symptoms returned in early December 2017  when I took over his care .  Sigmoidoscopy demonstrated severe left-sided colitis.   He was treated with oral steroids with resolution of rectal bleeding and discharged  4 days after discharge he returned to the hospital with severe colitis,readmitted 10/27/16 with ecoli sepsis/bacteriemia/fevers while on prednisone 40 mg .found to have pancolitis on CT scan.  Thrombosis of the inferior mesenteric vein.  We wanted to transfer him to a tertiary center but he refused . He was seen at [facility] who specializes in IBD for a second opinion as he has had prior skin cancer with regards to options. She saw him on 12/01/16.Commenced on remicaid 01/05/17. 05/2017 due to severe reaction to Remicade.Commenced on Entyvio isubsequently .       In the past on multiple occasions  vaccination for hepatitis a and B pneumococcal vaccination has been recommended.  On multiple occasions I have discussed about colon cancer cancer screening, dysplasia surveillance with a colonoscopy and explained the very high risk of colon cancer in his case and strong recommendations to undergo the same but he has refused every single time. 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  I have previously provided him the journal article from inflammatory bowel diseases dated December 18, 2020 by Hong SJ et al  titled "ustekinumab and vedolizumab appears safe to use in patients with inflammatory bowel disease and a history of cancer"  03/19/2022: LFT''s normal , Hb 12.8 grams 03/19/2022: thalamic stroke, on asprin and plavix    Interval history 04/16/2022-10/22/2021   10/13/2018: Hepatic function ,BMP normal, B12-normal. Hb 12.5  He says that he is generally not feeling well could not pinpoint exactly what was not right.  From the GI point of view has no complaints says he has regular bowel movements no blood in his stool.  He just feels like he is deteriorating overall.  He is very unhappy that he has lost the part of his eyesight from herpes zoster some years back.  He feels like the Weyman Rodney is the cause of a lot of his problems although he is keeping his symptoms at check     Current Outpatient Medications  Medication Sig  Dispense Refill   ACCU-CHEK AVIVA PLUS test strip 1 each by Other route as needed for other.      ACCU-CHEK SOFTCLIX LANCETS lancets USE 2 TIMES DAILY. USE AS INSTRUCTED.     acyclovir (ZOVIRAX) 400 MG tablet Take 400 mg by mouth daily.     aspirin EC 81 MG tablet Take 81 mg by mouth in the morning and at bedtime.     atorvastatin (LIPITOR) 40 MG tablet Take 1 tablet by mouth 2 (two) times daily.     Blood Glucose Monitoring Suppl (ACCU-CHEK GUIDE) w/Device KIT See admin instructions.     carvedilol (COREG) 3.125 MG tablet Take 3.125 mg by  mouth 2 (two) times daily with a meal.      clopidogrel (PLAVIX) 75 MG tablet Take 75 mg by mouth at bedtime.     Cysteamine Bitartrate (PROCYSBI) 300 MG PACK See admin instructions.     DULoxetine (CYMBALTA) 30 MG capsule Take 30 mg by mouth daily.     gabapentin (NEURONTIN) 300 MG capsule Take 300 mg by mouth daily in the afternoon.     glipiZIDE (GLUCOTROL) 10 MG tablet Take 10 mg by mouth 2 (two) times daily.      levothyroxine (SYNTHROID) 75 MCG tablet Take 75 mcg by mouth daily before breakfast.     Multiple Vitamins-Minerals (CENTRUM SILVER 50+MEN PO) Take 1 tablet by mouth daily.     Multiple Vitamins-Minerals (PRESERVISION AREDS 2 PO) Take 1 capsule by mouth daily.     Omega 3 1200 MG CAPS Take 1 capsule by mouth daily.     omeprazole (PRILOSEC) 20 MG capsule Take 20 mg by mouth in the morning.     prednisoLONE acetate (PRED FORTE) 1 % ophthalmic suspension Place 1 drop into the right eye daily.     Probiotic Product (PROBIOTIC DAILY PO) Take 1 capsule by mouth daily.      tadalafil (CIALIS) 5 MG tablet Take 1-4 tablets (5-20 mg total) by mouth daily as needed for erectile dysfunction. 30 tablet 11   traMADol (ULTRAM) 50 MG tablet Take 50-100 mg by mouth daily in the afternoon.     valACYclovir (VALTREX) 500 MG tablet Take 500 mg by mouth 2 (two) times daily.     vedolizumab (ENTYVIO) 300 MG injection Inject into the vein. Every 8 weeks 1 each 6   No current facility-administered medications for this visit.    Allergies as of 10/22/2022   (No Known Allergies)    ROS:  General: Negative for anorexia, weight loss, fever, chills, fatigue, weakness. ENT: Negative for hoarseness, difficulty swallowing , nasal congestion. CV: Negative for chest pain, angina, palpitations, dyspnea on exertion, peripheral edema.  Respiratory: Negative for dyspnea at rest, dyspnea on exertion, cough, sputum, wheezing.  GI: See history of present illness. GU:  Negative for dysuria, hematuria, urinary  incontinence, urinary frequency, nocturnal urination.  Endo: Negative for unusual weight change.    Physical Examination:   BP (!) 146/78   Pulse 68   Temp 98 F (36.7 C) (Oral)   Wt 168 lb (76.2 kg)   BMI 23.10 kg/m   General: Well-nourished, well-developed in no acute distress.  Eyes: No icterus. Conjunctivae pink. Neuro: Alert and oriented x 3.  Grossly intact. Skin: Warm and dry, no jaundice.   Psych: Alert and cooperative, normal mood and affect.   Imaging Studies: No results found.  Assessment and Plan:   David [name] is a 77 y.o. y/o male here to follow up .  He has a history of long standing ulcerative colitis- off treatment for many years and recent onset of symptoms in 2017 while off all therapy .Prior history of skin cancer (melanoma and he recalls basal cell ca) in the past Previously colectomy was discussed but he was not keen. Tried Infliximab, cyclosporin in the past which had to be D/c  ,  change dto Entyvio in 05/2017 .  He is due for a tetanus shot, COVID booster, discussed about it presently as well as in the past which she has declined.  Previously discussed about a colonoscopy stressed on the need for the same as he is high risk for colon cancer explained to him that colon cancer symptoms may not manifest off until it is spread to various parts of the body.       Plan  1.  TB QuantiFERON overdue he is not keen on getting labs checked 2.  Continue with Entyvio infusions: 3.  Discussed about colonoscopy for colon cancer screening he is not keen.  He strongly contemplating about stopping his Entyvio infusions.  I explained to him that he would be at very high risk for recurrence of symptomatic colitis and the last time he had it he was extremely sick in the hospital.  We discussed about the newer therapies which have not been widely used in patients at his age and comorbidities, overall Weyman Rodney is relatively well-tolerated and used for many years.  This is  particularly important in his case as he has been on it for many years and clinically and biochemically his colitis has been in remission.    Dr [name]  MD,MRCP [facility]) Follow up in 6 months

## 2022-10-29 ENCOUNTER — Ambulatory Visit
Admission: RE | Admit: 2022-10-29 | Discharge: 2022-10-29 | Disposition: A | Discharge status: Home / Self Care | Payer: Medicare Other | Source: Ambulatory Visit | Attending: Gastroenterology | Admitting: Gastroenterology

## 2022-10-29 NOTE — OR Nursing (Signed)
I contacted Dr. Vicente Males about this patient having received his flu shot 9 days ago. Dr. Vicente Males reports patient should be rescheduled for entyvio next week. Pharmacy notified.

## 2022-11-05 ENCOUNTER — Encounter
Admission: RE | Admit: 2022-11-05 | Discharge: 2022-11-05 | Disposition: A | Discharge status: Home / Self Care | Payer: Medicare Other | Source: Ambulatory Visit | Attending: Gastroenterology | Admitting: Gastroenterology

## 2022-11-05 DIAGNOSIS — K5190 Ulcerative colitis, unspecified, without complications: Secondary | ICD-10-CM | POA: Insufficient documentation

## 2022-11-05 MED ORDER — VEDOLIZUMAB 300 MG IV SOLR
300.0000 mg | Freq: Once | INTRAVENOUS | Status: AC
  Administered 2022-11-05: 300 mg via INTRAVENOUS
  Filled 2022-11-05: qty 5

## 2022-12-08 ENCOUNTER — Other Ambulatory Visit: Payer: Self-pay | Admitting: Physical Medicine and Rehabilitation

## 2022-12-08 DIAGNOSIS — M5416 Radiculopathy, lumbar region: Secondary | ICD-10-CM

## 2022-12-09 ENCOUNTER — Other Ambulatory Visit: Payer: Self-pay

## 2022-12-09 MED ORDER — VEDOLIZUMAB 300 MG IV SOLR: INTRAVENOUS | 6 refills | Status: AC

## 2022-12-11 ENCOUNTER — Telehealth: Payer: Self-pay

## 2022-12-11 NOTE — Telephone Encounter (Signed)
[facility] called me back and they stated that they wanted to make sure of the prescription's instructions and that I needed to start a PA for the patient. Therefore, I went on [email or web address] and started a new PA and now I have to wait for a response since it's under review.

## 2022-12-11 NOTE — Telephone Encounter (Signed)
[facility] called wanting for me to call them back in reference to patient's prescription at [facility] [phone] but had to leave them my phone number since they are currently busy. They stated they had questions about his Entyvio.

## 2022-12-23 ENCOUNTER — Ambulatory Visit
Admission: RE | Admit: 2022-12-23 | Discharge: 2022-12-23 | Disposition: A | Discharge status: Home / Self Care | Payer: Medicare Other | Source: Ambulatory Visit | Attending: Physical Medicine and Rehabilitation | Admitting: Physical Medicine and Rehabilitation

## 2022-12-23 DIAGNOSIS — M5416 Radiculopathy, lumbar region: Secondary | ICD-10-CM

## 2022-12-31 ENCOUNTER — Ambulatory Visit
Admission: RE | Admit: 2022-12-31 | Discharge: 2022-12-31 | Disposition: A | Discharge status: Home / Self Care | Payer: Medicare Other | Source: Ambulatory Visit | Attending: Gastroenterology | Admitting: Gastroenterology

## 2022-12-31 DIAGNOSIS — K5100 Ulcerative (chronic) pancolitis without complications: Secondary | ICD-10-CM | POA: Insufficient documentation

## 2022-12-31 MED ORDER — VEDOLIZUMAB 300 MG IV SOLR
300.0000 mg | Freq: Once | INTRAVENOUS | Status: AC
  Administered 2022-12-31: 300 mg via INTRAVENOUS
  Filled 2022-12-31: qty 5

## 2023-01-07 ENCOUNTER — Other Ambulatory Visit: Payer: Self-pay

## 2023-02-25 ENCOUNTER — Ambulatory Visit
Admission: RE | Admit: 2023-02-25 | Discharge: 2023-02-25 | Disposition: A | Discharge status: Home / Self Care | Payer: Medicare Other | Source: Ambulatory Visit | Attending: Gastroenterology | Admitting: Gastroenterology

## 2023-02-25 DIAGNOSIS — K5100 Ulcerative (chronic) pancolitis without complications: Secondary | ICD-10-CM | POA: Insufficient documentation

## 2023-02-25 MED ORDER — VEDOLIZUMAB 300 MG IV SOLR
300.0000 mg | Freq: Once | INTRAVENOUS | Status: AC
  Administered 2023-02-25: 300 mg via INTRAVENOUS
  Filled 2023-02-25: qty 5

## 2023-03-10 DIAGNOSIS — H469 Unspecified optic neuritis: Secondary | ICD-10-CM

## 2023-04-22 ENCOUNTER — Ambulatory Visit
Admission: RE | Admit: 2023-04-22 | Discharge: 2023-04-22 | Disposition: A | Discharge status: Home / Self Care | Payer: Medicare Other | Source: Ambulatory Visit | Attending: Gastroenterology | Admitting: Gastroenterology

## 2023-04-22 DIAGNOSIS — K5190 Ulcerative colitis, unspecified, without complications: Secondary | ICD-10-CM | POA: Insufficient documentation

## 2023-04-22 MED ORDER — VEDOLIZUMAB 300 MG IV SOLR
300.0000 mg | Freq: Once | INTRAVENOUS | Status: AC
  Administered 2023-04-22: 300 mg via INTRAVENOUS
  Filled 2023-04-22: qty 5

## 2023-04-22 NOTE — Progress Notes (Signed)
Patient received Entyvio infusion today. VSS. No signs of acute distress. Scheduled for next month.

## 2023-04-27 ENCOUNTER — Other Ambulatory Visit: Payer: Self-pay

## 2023-04-27 DIAGNOSIS — H469 Unspecified optic neuritis: Secondary | ICD-10-CM

## 2023-05-07 ENCOUNTER — Other Ambulatory Visit: Payer: Medicare Other

## 2023-05-13 ENCOUNTER — Ambulatory Visit
Admission: RE | Admit: 2023-05-13 | Discharge: 2023-05-13 | Disposition: A | Discharge status: Home / Self Care | Payer: Medicare Other | Source: Ambulatory Visit

## 2023-05-13 DIAGNOSIS — H469 Unspecified optic neuritis: Secondary | ICD-10-CM

## 2023-05-13 MED ORDER — GADOPICLENOL 0.5 MMOL/ML IV SOLN
7.0000 mL | Freq: Once | INTRAVENOUS | Status: AC | PRN
  Administered 2023-05-13: 7 mL via INTRAVENOUS

## 2023-05-17 ENCOUNTER — Other Ambulatory Visit: Payer: Self-pay

## 2023-05-17 DIAGNOSIS — H469 Unspecified optic neuritis: Secondary | ICD-10-CM

## 2023-05-19 ENCOUNTER — Ambulatory Visit: Payer: Medicare Other | Admitting: Urology

## 2023-05-29 ENCOUNTER — Other Ambulatory Visit: Payer: Self-pay | Admitting: Urology

## 2023-05-29 DIAGNOSIS — N529 Male erectile dysfunction, unspecified: Secondary | ICD-10-CM

## 2023-06-02 ENCOUNTER — Encounter: Payer: Self-pay | Admitting: Urology

## 2023-06-02 ENCOUNTER — Ambulatory Visit: Payer: Medicare Other | Admitting: Urology

## 2023-06-02 VITALS — BP 115/63 | HR 76 | Ht 71.0 in | Wt 165.8 lb

## 2023-06-02 DIAGNOSIS — N401 Enlarged prostate with lower urinary tract symptoms: Secondary | ICD-10-CM | POA: Diagnosis not present

## 2023-06-02 DIAGNOSIS — R399 Unspecified symptoms and signs involving the genitourinary system: Secondary | ICD-10-CM

## 2023-06-02 DIAGNOSIS — N529 Male erectile dysfunction, unspecified: Secondary | ICD-10-CM | POA: Diagnosis not present

## 2023-06-02 MED ORDER — TADALAFIL 5 MG PO TABS: 5.0000 mg | ORAL_TABLET | Freq: Every day | ORAL | 11 refills | Status: DC | PRN

## 2023-06-02 NOTE — Progress Notes (Signed)
   06/02/2023 11:11 AM   Osborne Oman [birth year] [id number]  Reason for visit: Follow up penile adhesions, BPH, ED  HPI: 77 year old male who had significant phimosis with dense circumferential penile adhesions, and underwent takedown of these adhesions with sedation on 01/02/2022.  He was initially having some problems with re-adhesion of the foreskin, but by applying some surgical lube a few times a day this has since resolved.  He is not having any pain with erections at this point and feels he is doing well.  On exam, foreskin retracts easily and no evidence of residual adhesions, raw skin areas have healed nicely and no open tissue noted.  He is using Cialis 5 to 10 mg on demand with good results for ED.  He has some mild urinary symptoms of weak stream and nocturia 1-2 times overnight.  Previously tried Flomax but this was stopped secondary to bothersome side effects.  Currently taking super beta prostate, we also discussed considering saw palmetto over-the-counter if that some patients find helpful.  Cialis refilled Can trial saw palmetto OTC if desires for mild urinary symptoms RTC 1 year PVR and symptom check  Sondra Come, MD  [facility] [address], [address] [place], [place] [address] [phone]

## 2023-06-02 NOTE — Patient Instructions (Signed)
You can try saw palmetto over-the-counter supplement to help with prostate issues and weak urinary stream.  This can be taken with or instead of the super beta prostate.

## 2023-06-24 ENCOUNTER — Ambulatory Visit
Admission: RE | Admit: 2023-06-24 | Discharge: 2023-06-24 | Disposition: A | Discharge status: Home / Self Care | Payer: Medicare Other | Source: Ambulatory Visit | Attending: Gastroenterology | Admitting: Gastroenterology

## 2023-06-24 DIAGNOSIS — Z79620 Long term (current) use of immunosuppressive biologic: Secondary | ICD-10-CM | POA: Diagnosis not present

## 2023-06-24 DIAGNOSIS — K5100 Ulcerative (chronic) pancolitis without complications: Secondary | ICD-10-CM | POA: Insufficient documentation

## 2023-06-24 MED ORDER — VEDOLIZUMAB 300 MG IV SOLR
300.0000 mg | Freq: Once | INTRAVENOUS | Status: AC
  Administered 2023-06-24: 300 mg via INTRAVENOUS
  Filled 2023-06-24: qty 5

## 2023-07-27 ENCOUNTER — Other Ambulatory Visit (HOSPITAL_BASED_OUTPATIENT_CLINIC_OR_DEPARTMENT_OTHER): Payer: Self-pay

## 2023-08-05 ENCOUNTER — Encounter: Payer: Self-pay | Admitting: Ophthalmology

## 2023-08-06 NOTE — Discharge Instructions (Signed)

## 2023-08-10 ENCOUNTER — Ambulatory Visit: Payer: Medicare Other | Admitting: Anesthesiology

## 2023-08-10 ENCOUNTER — Other Ambulatory Visit: Payer: Self-pay

## 2023-08-10 ENCOUNTER — Encounter: Payer: Self-pay | Admitting: Ophthalmology

## 2023-08-10 ENCOUNTER — Ambulatory Visit
Admission: RE | Admit: 2023-08-10 | Discharge: 2023-08-10 | Disposition: A | Discharge status: Home / Self Care | Payer: Medicare Other | Attending: Ophthalmology | Admitting: Ophthalmology

## 2023-08-10 ENCOUNTER — Encounter
Admission: RE | Disposition: A | Discharge status: Home / Self Care | Payer: Self-pay | Source: Home / Self Care | Attending: Ophthalmology

## 2023-08-10 DIAGNOSIS — Z85820 Personal history of malignant melanoma of skin: Secondary | ICD-10-CM | POA: Insufficient documentation

## 2023-08-10 DIAGNOSIS — Z7902 Long term (current) use of antithrombotics/antiplatelets: Secondary | ICD-10-CM | POA: Insufficient documentation

## 2023-08-10 DIAGNOSIS — I2510 Atherosclerotic heart disease of native coronary artery without angina pectoris: Secondary | ICD-10-CM | POA: Insufficient documentation

## 2023-08-10 DIAGNOSIS — I110 Hypertensive heart disease with heart failure: Secondary | ICD-10-CM | POA: Insufficient documentation

## 2023-08-10 DIAGNOSIS — I252 Old myocardial infarction: Secondary | ICD-10-CM | POA: Insufficient documentation

## 2023-08-10 DIAGNOSIS — E034 Atrophy of thyroid (acquired): Secondary | ICD-10-CM | POA: Insufficient documentation

## 2023-08-10 DIAGNOSIS — K219 Gastro-esophageal reflux disease without esophagitis: Secondary | ICD-10-CM | POA: Diagnosis not present

## 2023-08-10 DIAGNOSIS — I509 Heart failure, unspecified: Secondary | ICD-10-CM | POA: Diagnosis not present

## 2023-08-10 DIAGNOSIS — E1136 Type 2 diabetes mellitus with diabetic cataract: Secondary | ICD-10-CM | POA: Insufficient documentation

## 2023-08-10 DIAGNOSIS — H2511 Age-related nuclear cataract, right eye: Secondary | ICD-10-CM | POA: Diagnosis present

## 2023-08-10 HISTORY — PX: CATARACT EXTRACTION W/PHACO: SHX586

## 2023-08-10 HISTORY — DX: Other ill-defined heart diseases: I51.89

## 2023-08-10 LAB — GLUCOSE, CAPILLARY: Glucose-Capillary: 93 mg/dL (ref 70–99)

## 2023-08-10 SURGERY — PHACOEMULSIFICATION, CATARACT, WITH IOL INSERTION
Anesthesia: Monitor Anesthesia Care | Site: Eye | Laterality: Right

## 2023-08-10 MED ORDER — TETRACAINE HCL 0.5 % OP SOLN
1.0000 [drp] | OPHTHALMIC | Status: DC | PRN
Start: 2023-08-10 — End: 2023-08-10
  Administered 2023-08-10 (×3): 1 [drp] via OPHTHALMIC

## 2023-08-10 MED ORDER — SIGHTPATH DOSE#1 BSS IO SOLN
INTRAOCULAR | Status: DC | PRN
  Administered 2023-08-10: 1 mL

## 2023-08-10 MED ORDER — MOXIFLOXACIN HCL 0.5 % OP SOLN
OPHTHALMIC | Status: DC | PRN
  Administered 2023-08-10: .2 mL via OPHTHALMIC

## 2023-08-10 MED ORDER — SIGHTPATH DOSE#1 BSS IO SOLN
INTRAOCULAR | Status: DC | PRN
  Administered 2023-08-10: 55 mL via OPHTHALMIC

## 2023-08-10 MED ORDER — ARMC OPHTHALMIC DILATING DROPS
1.0000 | OPHTHALMIC | Status: DC | PRN
  Administered 2023-08-10 (×3): 1 via OPHTHALMIC

## 2023-08-10 MED ORDER — LACTATED RINGERS IV SOLN: INTRAVENOUS | Status: DC

## 2023-08-10 MED ORDER — SIGHTPATH DOSE#1 BSS IO SOLN
INTRAOCULAR | Status: DC | PRN
  Administered 2023-08-10: 15 mL

## 2023-08-10 MED ORDER — MIDAZOLAM HCL 2 MG/2ML IJ SOLN
INTRAMUSCULAR | Status: AC
  Filled 2023-08-10: qty 2

## 2023-08-10 MED ORDER — BRIMONIDINE TARTRATE-TIMOLOL 0.2-0.5 % OP SOLN
OPHTHALMIC | Status: DC | PRN
  Administered 2023-08-10: 1 [drp] via OPHTHALMIC

## 2023-08-10 MED ORDER — MIDAZOLAM HCL 2 MG/2ML IJ SOLN
INTRAMUSCULAR | Status: DC | PRN
  Administered 2023-08-10: 1 mg via INTRAVENOUS

## 2023-08-10 MED ORDER — SODIUM CHLORIDE 0.9% FLUSH: 10.0000 mL | INTRAVENOUS | Status: DC | PRN

## 2023-08-10 MED ORDER — SIGHTPATH DOSE#1 NA CHONDROIT SULF-NA HYALURON 40-17 MG/ML IO SOLN
INTRAOCULAR | Status: DC | PRN
  Administered 2023-08-10: 1 mL via INTRAOCULAR

## 2023-08-10 MED ORDER — ARMC OPHTHALMIC DILATING DROPS
OPHTHALMIC | Status: AC
  Filled 2023-08-10: qty 0.5

## 2023-08-10 MED ORDER — TETRACAINE HCL 0.5 % OP SOLN
OPHTHALMIC | Status: AC
  Filled 2023-08-10: qty 4

## 2023-08-10 MED ORDER — FENTANYL CITRATE (PF) 100 MCG/2ML IJ SOLN
INTRAMUSCULAR | Status: DC | PRN
  Administered 2023-08-10: 50 ug via INTRAVENOUS

## 2023-08-10 MED ORDER — FENTANYL CITRATE (PF) 100 MCG/2ML IJ SOLN
INTRAMUSCULAR | Status: AC
  Filled 2023-08-10: qty 2

## 2023-08-10 SURGICAL SUPPLY — 16 items
ANGLE REVERSE CUT SHRT 25GA (CUTTER) ×1
CANNULA ANT/CHMB 27G (MISCELLANEOUS) IMPLANT
CANNULA ANT/CHMB 27GA (MISCELLANEOUS)
CATARACT SUITE SIGHTPATH (MISCELLANEOUS) ×1
CYSTOTOME ANGL RVRS SHRT 25G (CUTTER) ×1 IMPLANT
FEE CATARACT SUITE SIGHTPATH (MISCELLANEOUS) ×1 IMPLANT
GLOVE BIOGEL PI IND STRL 8 (GLOVE) ×1 IMPLANT
GLOVE SURG LX STRL 8.0 MICRO (GLOVE) ×1 IMPLANT
LENS IOL TECNIS EYHANCE 21.0 (Intraocular Lens) IMPLANT
NDL FILTER BLUNT 18X1 1/2 (NEEDLE) ×1 IMPLANT
NEEDLE FILTER BLUNT 18X1 1/2 (NEEDLE) ×1
PACK VIT ANT 23G (MISCELLANEOUS) IMPLANT
RING MALYGIN (MISCELLANEOUS) IMPLANT
SUT ETHILON 10-0 CS-B-6CS-B-6 (SUTURE)
SUTURE EHLN 10-0 CS-B-6CS-B-6 (SUTURE) IMPLANT
SYR 3ML LL SCALE MARK (SYRINGE) ×1 IMPLANT

## 2023-08-10 NOTE — Anesthesia Preprocedure Evaluation (Addendum)
Anesthesia Evaluation  Patient identified by MRN, date of birth, ID band Patient awake    Reviewed: Allergy & Precautions, H&P , NPO status , Patient's Chart, lab work & pertinent test results  Airway Mallampati: III  TM Distance: <3 FB Neck ROM: Full    Dental  (+) Chipped   Pulmonary neg pulmonary ROS   Pulmonary exam normal breath sounds clear to auscultation       Cardiovascular hypertension, + CAD, + Past MI and +CHF  negative cardio ROS Normal cardiovascular exam Rhythm:Regular Rate:Normal  03-20-22  1. Left ventricular ejection fraction, by estimation, is 50 to 55%. The  left ventricle has low normal function. The left ventricle has no regional  wall motion abnormalities. There is mild concentric left ventricular  hypertrophy. Left ventricular  diastolic parameters are consistent with Grade I diastolic dysfunction  (impaired relaxation).   2. Right ventricular systolic function is normal. The right ventricular  size is normal.   3. Left atrial size was mildly dilated.   4. The mitral valve is normal in structure. Mild mitral valve  regurgitation. No evidence of mitral stenosis.   5. The aortic valve is normal in structure. Aortic valve regurgitation is  not visualized. Aortic valve sclerosis is present, with no evidence of  aortic valve stenosis.   6. The inferior vena cava is normal in size with greater than 50%  respiratory variability, suggesting right atrial pressure of 3 mmHg.   07-07-17   Mid RCA lesion, 100 %stenosed.  Mid LAD to Dist LAD lesion, 10 %stenosed.  Dist LAD lesion, 10 %stenosed.  There is moderate to severe left ventricular systolic dysfunction.  The left ventricular ejection fraction is 25-35% by visual estimate.  There is mild (2+) mitral regurgitation.  There is no aortic valve stenosis.  There is no mitral valve stenosis and no mitral valve prolapse evident.   Occluded bms in rca  with collaterals from lad LM normal Lcx non dominant with no disease. LAD bms x 2 in mid lad patent.  EF 30-35% Medical management to include beta blockers. Will d/c lisinopril and place on Entresto after 72 hours.       Neuro/Psych  Neuromuscular disease CVA negative neurological ROS  negative psych ROS   GI/Hepatic negative GI ROS, Neg liver ROS, PUD,GERD  ,,  Endo/Other  diabetes    Renal/GU negative Renal ROS  negative genitourinary   Musculoskeletal negative musculoskeletal ROS (+) Arthritis ,  Patient has back and neck issues; discussed w/patient for him to let us know his comfort level and if we have him properly comfortable PRIOR to sedation. Patient agreed.    Abdominal   Peds negative pediatric ROS (+)  Hematology negative hematology ROS (+) Blood dyscrasia, anemia   Anesthesia Other Findings CAD (coronary artery disease) 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) GERD (gastroesophageal reflux disease) Hypothyroidism due to acquired atrophy of thyroid T2DM (type 2 diabetes mellitus) (HCC) Hyperlipidemia, unspecified Colitis due to Clostridioides difficile Anemia Vitamin D deficiency ST elevation myocardial infarction (STEMI) of anterior wall (HCC) Arthritis of both hands Squamous cell skin cancer Ischemic cardiomyopathy CHF (congestive heart failure) (HCC) SVT (supraventricular tachycardia) (HCC) Erectile dysfunction Crohn's disease (HCC) Prostatitis 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 Long term current use of antithrombotics/antiplatelets Melanoma (HCC) NSTEMI (non-ST elevated myocardial infarction) (HCC)  Long term current use of immunosuppressive drug History of heart artery stent  Grade I diastolic dysfunction   Reproductive/Obstetrics negative OB ROS  Anesthesia Physical Anesthesia Plan  ASA: 4  Anesthesia Plan: MAC   Post-op Pain  Management:    Induction: Intravenous  PONV Risk Score and Plan:   Airway Management Planned: Natural Airway and Nasal Cannula  Additional Equipment:   Intra-op Plan:   Post-operative Plan:   Informed Consent: I have reviewed the patients History and Physical, chart, labs and discussed the procedure including the risks, benefits and alternatives for the proposed anesthesia with the patient or authorized representative who has indicated his/her understanding and acceptance.     Dental Advisory Given  Plan Discussed with: Anesthesiologist, CRNA and Surgeon  Anesthesia Plan Comments: (Patient consented for risks of anesthesia including but not limited to:  - adverse reactions to medications - damage to eyes, teeth, lips or other oral mucosa - nerve damage due to positioning  - sore throat or hoarseness - Damage to heart, brain, nerves, lungs, other parts of body or loss of life  Patient voiced understanding and assent.)         Anesthesia Quick Evaluation

## 2023-08-10 NOTE — H&P (Signed)
[facility]   Primary Care Physician:  Lauro Regulus, MD Ophthalmologist: Dr. Maren Reamer  Pre-Procedure History & Physical: HPI:  David [name] is a 77 y.o. male here for cataract surgery.   Past Medical History:  Diagnosis Date   Anemia    Arthritis of both hands    CAD (coronary artery disease) 08/03/2006   a.) Anterior STEMI 08/03/2006 --> LHC EF 48%; 90% pRCA, 99% mLAD --> PCI placing 2.0 x 28 mm Mini-Vision BMS to dLAD and 2.25 x 12 mm Mini-Vision BMS to pLAD; plans for staged PCI of RCA. b.) Staged PCI 10/25/20017: 2.0 x 18 mm Driver BMS to pRCA. c.) NSTEMI 09/2006. d.) LHC 07/07/2017: EF 25-35%; 100% mRCA; patent BMS x 2 to LAD; BMS to the RCA totally occ; not amenable to further intervention.   CHF (congestive heart failure) (HCC)    a.) TTE 09/17/2006: EF 40%; LV mural thrombus; mild LVH; apical AK, inf HK; triv MR/TR/PR; G1DD. b.) TTE 06/16/2007: EF 35%; diff HK; apical AK; mild LA dil, triv AR/TR/PR, mild MR. c.) TTE 07/31/2010: EF 30%; septal HK, apical/inf/post AK; triv AR/MR; G1DD. d.) TTE 07/02/2017: EF 35%; mild LVH; diff HK; panval regurg. e.) TTE 03/13/2021: EF 40%; mild LCH; diff HK; Triv AR/PR, mild MR/TR; G1DD.   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) 03/07/2015   a.) on vedolizumab   Colitis due to Clostridioides difficile 07/08/2011   Crohn's disease (HCC)    Erectile dysfunction    a.) on PDE5i (tadalafil)   GERD (gastroesophageal reflux disease) 07/08/2011   Grade I diastolic dysfunction    History of heart artery stent    a.) TOTAL # stents (as of 01/01/2022): 3 --> 2.0 x 28 mm mini Vision BMS to the distal LAD, 2.25 x 12 mm mini Vision BMS to the proximal LAD (08/03/2006); 3.0 x 18 mm Driver BMS to the proximal RCA (08/05/2006)   Hyperlipidemia, unspecified 07/08/2011   Hypothyroidism due to acquired atrophy of thyroid 02/15/2015   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 b.) TTE 09/17/2006: EF 40%. c.) Cardiac MRI 09/15/2006: EF 35-40%. d.) TTE 06/16/2007: EF 35%. e.) TTE 02/02/2008: EF 30%. f.) TTE 01/03/2009: EF 30%. g.) TTe 07/31/2010: EF 30%. h.) Stress echo 03/19/2015: EF 45%. i.) TTE 07/02/2017: EF 35%. j.) TTE 03/13/2021: EF 40%.   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   Melanoma (HCC)    NSTEMI (non-ST elevated myocardial infarction) (HCC) 09/11/2006   Prostatitis    Squamous cell skin cancer    ST elevation myocardial infarction (STEMI) of anterior wall (HCC) 08/03/2006   a.) LHC 08/03/2006: EF 48%; 90% pRCA, 99% mLAD --> 2.0 x 28 mm Mini-Vision BMS to dLAD and 2.25 x 12 mm Mini-Vision BMS to pLAD; plan for staged PCI of RCA. b.) PCI of RCA 08/05/2006 placing a 3.0 x 18 mm Driver BMS to pRCA.   SVT (supraventricular tachycardia) (HCC)    T2DM (type 2 diabetes mellitus) (HCC) 07/08/2011   Vitamin D deficiency     Past Surgical History:  Procedure Laterality Date   BACK SURGERY     1972 ruptured disc   CARDIAC CATHETERIZATION  stents   CHOLECYSTECTOMY     COLONOSCOPY  07/20/2006   Crohn's disease   CORONARY ANGIOGRAPHY N/A 07/07/2017   Procedure: CORONARY ANGIOGRAPHY;  Surgeon: Dalia Heading, MD;  Location: [facility]IVE [facility];  Service: Cardiovascular;  Laterality: N/A;   CORONARY STENT PLACEMENT     FLEXIBLE SIGMOIDOSCOPY N/A 09/25/2016   Procedure: FLEXIBLE SIGMOIDOSCOPY;  Surgeon: Wyline Mood, MD;  Location: [facility] ENDOSCOPY;  Service: Endoscopy;  Laterality: N/A;   FRACTURE SURGERY Right    wrist fracture pins have been removed 1975   LEFT HEART CATH Left 07/07/2017   Procedure: Left Heart Cath;  Surgeon: Dalia Heading, MD;  Location: [facility] CV [facility];  Service:  Cardiovascular;  Laterality: Left;   LYSIS OF ADHESION N/A 01/02/2022   Procedure: LYSIS OF PENILE ADHESIONS;  Surgeon: Sondra Come, MD;  Location: [facility] ORS;  Service: Urology;  Laterality: N/A;   MELANOMA REMOVED     parotid gland removal     w melanoma surgery lymph nodes removed    Prior to Admission medications   Medication Sig Start Date End Date Taking? Authorizing Provider  ACCU-CHEK AVIVA PLUS test strip 1 each by Other route as needed for other.  09/01/16  Yes [provider]  ACCU-CHEK SOFTCLIX LANCETS lancets USE 2 TIMES DAILY. USE AS INSTRUCTED. 11/30/16  Yes [provider]  aspirin EC 81 MG tablet Take 81 mg by mouth in the morning and at bedtime.   Yes [provider]  atorvastatin (LIPITOR) 40 MG tablet Take 1 tablet by mouth 2 (two) times daily. 09/23/22  Yes [provider]  Blood Glucose Monitoring Suppl (ACCU-CHEK GUIDE) w/Device KIT See admin instructions. 03/26/22  Yes [provider]  carvedilol (COREG) 3.125 MG tablet Take 3.125 mg by mouth 2 (two) times daily with a meal.  05/21/16  Yes [provider]  clopidogrel (PLAVIX) 75 MG tablet Take 75 mg by mouth at bedtime. 01/13/19  Yes [provider]  gabapentin (NEURONTIN) 300 MG capsule Take 300 mg by mouth 3 (three) times daily.   Yes [provider]  glipiZIDE (GLUCOTROL XL) 5 MG 24 hr tablet Take 5 mg by mouth daily.   Yes [provider]  levothyroxine (SYNTHROID) 75 MCG tablet Take 75 mcg by mouth daily before breakfast.   Yes [provider]  LYCOPENE PO Take 1 tablet by mouth daily.   Yes [provider]  Multiple Vitamins-Minerals (CENTRUM SILVER 50+MEN PO) Take 1 tablet by mouth daily.   Yes [provider]  Multiple Vitamins-Minerals (PRESERVISION AREDS 2 PO) Take 1 capsule by mouth daily.   Yes [provider]  Omega 3 1200 MG CAPS Take 1 capsule by mouth daily.   Yes [provider]   omeprazole (PRILOSEC) 20 MG capsule Take 20 mg by mouth in the morning. 07/17/19  Yes [provider]  Probiotic Product (PROBIOTIC DAILY PO) Take 1 capsule by mouth daily.    Yes [provider]  tadalafil (CIALIS) 5 MG tablet Take 1-4 tablets (5-20 mg total) by mouth daily as needed for erectile dysfunction. 06/02/23  Yes Sondra Come, MD  traMADol (ULTRAM) 50 MG tablet Take 50-100 mg by mouth daily in the afternoon. 04/30/21  Yes [provider]  valACYclovir (VALTREX) 500 MG tablet Take 500 mg by mouth 2 (two) times daily.   Yes [provider]  acetaminophen-codeine (TYLENOL #3) 300-30 MG tablet Take by mouth every 4 (four) hours as needed for moderate pain (  pain score 4-6). Patient not taking: Reported on 08/10/2023    [provider]  prednisoLONE acetate (PRED FORTE) 1 % ophthalmic suspension Place 1 drop into the right eye daily. Patient not taking: Reported on 08/10/2023 09/13/20   [provider]  vedolizumab (ENTYVIO) 300 MG injection Inject into the vein. Every 8 weeks 12/09/22 12/10/23  Wyline Mood, MD    Allergies as of 07/20/2023   (No Known Allergies)    Family History  Problem Relation Age of Onset   Heart disease Mother    Heart attack Father    Heart disease Sister    Heart disease Brother     Social History   Socioeconomic History   Marital status: Married    Spouse name: Not on file   Number of children: Not on file   Years of education: Not on file   Highest education level: Not on file  Occupational History   Not on file  Tobacco Use   Smoking status: Never    Passive exposure: Never   Smokeless tobacco: Never  Vaping Use   Vaping status: Never Used  Substance and Sexual Activity   Alcohol use: No   Drug use: No   Sexual activity: Not on file  Other Topics Concern   Not on file  Social History Narrative   Not on file   Social Determinants of Health   Financial Resource Strain: Low Risk   (07/27/2023)   [facility]   Overall Financial Resource Strain (CARDIA)    Difficulty of Paying Living Expenses: Not very hard  Food Insecurity: No Food Insecurity (07/27/2023)   Received from [facility]   Hunger Vital Sign    Worried About Running Out of Food in the Last Year: Never true    Ran Out of Food in the Last Year: Never true  Transportation Needs: No Transportation Needs (07/27/2023)   Received from [facility] - Transportation    In the past 12 months, has lack of transportation kept you from medical appointments or from getting medications?: No    Lack of Transportation (Non-Medical): No  Physical Activity: Not on file  Stress: Not on file  Social Connections: Not on file  Intimate Partner Violence: Not on file    Review of Systems: See HPI, otherwise negative ROS  Physical Exam: BP (!) 155/73   Pulse 69   Temp (!) 97.3 F (36.3 C) (Temporal)   Resp 12   Ht 5' 11.5" (1.816 m)   Wt 73.1 kg   SpO2 98%   BMI 22.17 kg/m  General:   Alert, cooperative in NAD Head:  Normocephalic and atraumatic. Respiratory:  Normal work of breathing. Cardiovascular:  RRR  Impression/Plan: David [name] is here for cataract surgery.  Risks, benefits, limitations, and alternatives regarding cataract surgery have been reviewed with the patient.  Questions have been answered.  All parties agreeable.   Galen Manila, MD  08/10/2023, 9:38 AM

## 2023-08-10 NOTE — Op Note (Signed)
PREOPERATIVE DIAGNOSIS:  Nuclear sclerotic cataract of the right eye.   POSTOPERATIVE DIAGNOSIS:  Cataract   OPERATIVE PROCEDURE:ORPROCALL@   SURGEON:  Galen Manila, MD.   ANESTHESIA:  Anesthesiologist: Marisue Humble, MD CRNA: Andee Poles, CRNA  1.      Managed anesthesia care. 2.      0.43ml of Shugarcaine was instilled in the eye following the paracentesis.   COMPLICATIONS:  None.   TECHNIQUE:   Stop and chop   DESCRIPTION OF PROCEDURE:  The patient was examined and consented in the preoperative holding area where the aforementioned topical anesthesia was applied to the right eye and then brought back to the Operating Room where the right eye was prepped and draped in the usual sterile ophthalmic fashion and a lid speculum was placed. A paracentesis was created with the side port blade and the anterior chamber was filled with viscoelastic. A near clear corneal incision was performed with the steel keratome. A continuous curvilinear capsulorrhexis was performed with a cystotome followed by the capsulorrhexis forceps. Hydrodissection and hydrodelineation were carried out with BSS on a blunt cannula. The lens was removed in a stop and chop  technique and the remaining cortical material was removed with the irrigation-aspiration handpiece. The capsular bag was inflated with viscoelastic and the Technis [id number]  lens was placed in the capsular bag without complication. The remaining viscoelastic was removed from the eye with the irrigation-aspiration handpiece. The wounds were hydrated. The anterior chamber was flushed with BSS and the eye was inflated to physiologic pressure. 0.6ml of Vigamox was placed in the anterior chamber. The wounds were found to be water tight. The eye was dressed with Combigan. The patient was given protective glasses to wear throughout the day and a shield with which to sleep tonight. The patient was also given drops with which to begin a drop regimen today and will  follow-up with me in one day. Implant Name Type Inv. Item Serial No. Manufacturer Lot No. LRB No. Used Action  LENS IOL TECNIS EYHANCE 21.0 - [id number] Intraocular Lens LENS IOL TECNIS EYHANCE 21.0 [id number] [employer]  Right 1 Implanted   Procedure(s) with comments: CATARACT EXTRACTION PHACO AND INTRAOCULAR LENS PLACEMENT (IOC) RIGHT DIABETIC (Right) - 8.11 0:45.3  Electronically signed: Galen Manila 08/10/2023 10:03 AM

## 2023-08-10 NOTE — Anesthesia Postprocedure Evaluation (Signed)
Anesthesia Post Note  Patient: David [name]  Procedure(s) Performed: CATARACT EXTRACTION PHACO AND INTRAOCULAR LENS PLACEMENT (IOC) RIGHT DIABETIC (Right: Eye)  Patient location during evaluation: PACU Anesthesia Type: MAC Level of consciousness: awake and alert Pain management: pain level controlled Vital Signs Assessment: post-procedure vital signs reviewed and stable Respiratory status: spontaneous breathing, nonlabored ventilation, respiratory function stable and patient connected to nasal cannula oxygen Cardiovascular status: stable and blood pressure returned to baseline Postop Assessment: no apparent nausea or vomiting Anesthetic complications: no   No notable events documented.   Last Vitals:  Vitals:   08/10/23 1005 08/10/23 1011  BP: 103/64 (!) 107/58  Pulse: 68   Resp: 12   Temp: 36.6 C   SpO2: 98%     Last Pain:  Vitals:   08/10/23 1011  TempSrc:   PainSc: 0-No pain                 Oniya Mandarino C Kasean Denherder

## 2023-08-10 NOTE — Transfer of Care (Signed)
Immediate Anesthesia Transfer of Care Note  Patient: David [name]  Procedure(s) Performed: CATARACT EXTRACTION PHACO AND INTRAOCULAR LENS PLACEMENT (IOC) RIGHT DIABETIC (Right: Eye)  Patient Location: PACU  Anesthesia Type: MAC  Level of Consciousness: awake, alert  and patient cooperative  Airway and Oxygen Therapy: Patient Spontanous Breathing and Patient connected to supplemental oxygen  Post-op Assessment: Post-op Vital signs reviewed, Patient's Cardiovascular Status Stable, Respiratory Function Stable, Patent Airway and No signs of Nausea or vomiting  Post-op Vital Signs: Reviewed and stable  Complications: No notable events documented.

## 2023-08-11 ENCOUNTER — Encounter: Payer: Self-pay | Admitting: Ophthalmology

## 2023-08-19 ENCOUNTER — Ambulatory Visit
Admission: RE | Admit: 2023-08-19 | Discharge: 2023-08-19 | Disposition: A | Discharge status: Home / Self Care | Payer: Medicare Other | Source: Ambulatory Visit | Attending: Gastroenterology | Admitting: Gastroenterology

## 2023-08-19 DIAGNOSIS — K51919 Ulcerative colitis, unspecified with unspecified complications: Secondary | ICD-10-CM | POA: Diagnosis present

## 2023-08-19 MED ORDER — VEDOLIZUMAB 300 MG IV SOLR
300.0000 mg | Freq: Once | INTRAVENOUS | Status: AC
  Administered 2023-08-19: 300 mg via INTRAVENOUS
  Filled 2023-08-19: qty 5

## 2023-10-14 ENCOUNTER — Ambulatory Visit
Admission: RE | Admit: 2023-10-14 | Discharge: 2023-10-14 | Disposition: A | Discharge status: Home / Self Care | Payer: Medicare Other | Source: Ambulatory Visit | Attending: Gastroenterology | Admitting: Gastroenterology

## 2023-10-14 DIAGNOSIS — Z79620 Long term (current) use of immunosuppressive biologic: Secondary | ICD-10-CM | POA: Insufficient documentation

## 2023-10-14 DIAGNOSIS — K5180 Other ulcerative colitis without complications: Secondary | ICD-10-CM | POA: Diagnosis present

## 2023-10-14 MED ORDER — VEDOLIZUMAB 300 MG IV SOLR
300.0000 mg | Freq: Once | INTRAVENOUS | Status: AC
  Administered 2023-10-14: 300 mg via INTRAVENOUS
  Filled 2023-10-14: qty 5

## 2023-10-14 MED ORDER — VEDOLIZUMAB 300 MG IV SOLR: 300.0000 mg | Freq: Once | INTRAVENOUS | Status: DC

## 2023-12-01 ENCOUNTER — Telehealth: Payer: Self-pay | Admitting: Gastroenterology

## 2023-12-01 NOTE — Telephone Encounter (Signed)
Called pharmacy back and let them know about the patient's prescription. They stated that they just needed verification that the patient was still on Entyvio and to verify the prescription and refill amount. They stated that they would be mailing the medication to the patient.

## 2023-12-01 NOTE — Telephone Encounter (Signed)
[facility] pharmacy they was calling today to request a prescription validation. They received a new (Entyvio) 300 MG prescription and the pharmacist want to read over it to have every confirm. They need a direct call from the clinic staff at [phone]. They are [facility] pharmacy the patient assistance program and they hope to hear from someone soon.

## 2023-12-06 NOTE — Telephone Encounter (Signed)
 The patient called and left a voicemail requesting to speak with Dr. Tobi Bastos nurse wanting to know about his Thompson Grayer) infusion coming up this Thursday. I called him back to let him know that we received his message and sent the message to the nurse.

## 2023-12-06 NOTE — Telephone Encounter (Signed)
 The patient called back because he said that the pharmacy said that he must have a prescription for the pen. The patient was approved for the pen. The patient gave me the number [phone] for the specialty pharmacy and the fax number [phone]. The patient said that he needs to know because if he can get the pen then he will cancel your infusion. Please advised because the patient don't know if he will get the David [name]) on the 27th.

## 2023-12-08 ENCOUNTER — Telehealth: Payer: Self-pay

## 2023-12-08 NOTE — Progress Notes (Signed)
 Re: Entyvio infusion Patient called to [facility] (infusion clinic) about his next infusion of Entyvio.  Patient states he will need the infusion because the injectable medication has not been sent to his residence.  The patient states he has called the office multiple times to no avail regarding validation for the new injectable medication that is being shipped to him through [facility]. The route needs to be updated FROM IV to INJECTABLE. IV Thompson Grayer is available here for him to receive tomorrow at his appointment. We will proceed with appointment as scheduled pending Dr Johnney Killian office contacting [facility] at [phone] to validate the injectable pen for patient home use. For further questions please contact Alexis @ [employer] at [phone].

## 2023-12-08 NOTE — Telephone Encounter (Signed)
 Called patient and left him a detailed message letting him know that I spoke with same day surgery-Cindy Melvyn Neth, RN and that she stated that everything was taken care of for tomorrow's infusion as Dr. Tobi Bastos agreed. I also let him know that Arline Asp had asked for me to contact [employer] and [employer] and make sure that his new shipment for the entyvio pen was taken care of. Therefore, after tomorrow's infusion, patient is to start the [facility] pen. I let the patient know that if he had any questions, to please give me a call.

## 2023-12-09 ENCOUNTER — Ambulatory Visit
Admission: RE | Admit: 2023-12-09 | Discharge: 2023-12-09 | Disposition: A | Discharge status: Home / Self Care | Payer: Medicare Other | Source: Ambulatory Visit | Attending: Gastroenterology | Admitting: Gastroenterology

## 2023-12-09 DIAGNOSIS — K51919 Ulcerative colitis, unspecified with unspecified complications: Secondary | ICD-10-CM | POA: Insufficient documentation

## 2023-12-09 MED ORDER — VEDOLIZUMAB 300 MG IV SOLR
300.0000 mg | Freq: Once | INTRAVENOUS | Status: AC
  Administered 2023-12-09: 300 mg via INTRAVENOUS
  Filled 2023-12-09: qty 5

## 2024-03-02 ENCOUNTER — Telehealth: Payer: Self-pay

## 2024-03-02 NOTE — Telephone Encounter (Signed)
 Contacted patient because [employer] (Entyvio ) sent a letter and a form to fill out and fax back. They stated that the patient had reported an adverse reaction with his last injection of Entyvio . Therefore, I asked patient what had happened and he stated that when he did his last injection, he told [employer] that the injection stung and it took about two days of pain but then it went away. Patient denied any fever, chills, redness on the site, nausea, vomiting, or dizziness. I told him that when he injects his Entyvio  today, to let us  know if he had any reaction so we could document and to call [employer] as well so we know if Dr. Antony Baumgartner is to change his medication or not. Patient understood and had no further questions. I then filled out the form and fax it back to them with patient's reaction and outcome.

## 2024-06-06 ENCOUNTER — Ambulatory Visit: Payer: Self-pay | Admitting: Urology

## 2024-06-07 ENCOUNTER — Ambulatory Visit: Admitting: Urology

## 2024-06-09 ENCOUNTER — Ambulatory Visit: Admitting: Urology

## 2024-06-09 VITALS — BP 116/66 | HR 70 | Ht 71.0 in | Wt 147.0 lb

## 2024-06-09 DIAGNOSIS — N475 Adhesions of prepuce and glans penis: Secondary | ICD-10-CM

## 2024-06-09 DIAGNOSIS — N401 Enlarged prostate with lower urinary tract symptoms: Secondary | ICD-10-CM

## 2024-06-09 DIAGNOSIS — N138 Other obstructive and reflux uropathy: Secondary | ICD-10-CM

## 2024-06-09 DIAGNOSIS — N529 Male erectile dysfunction, unspecified: Secondary | ICD-10-CM

## 2024-06-09 LAB — BLADDER SCAN AMB NON-IMAGING

## 2024-06-09 MED ORDER — TAMSULOSIN HCL 0.4 MG PO CAPS: 0.4000 mg | ORAL_CAPSULE | Freq: Every day | ORAL | 3 refills | Status: DC

## 2024-06-09 MED ORDER — TAMSULOSIN HCL 0.4 MG PO CAPS: 0.4000 mg | ORAL_CAPSULE | Freq: Every day | ORAL | 0 refills | Status: DC

## 2024-06-09 NOTE — Progress Notes (Signed)
   06/09/2024 11:10 AM   David [name] 01-10-1946 [id number]  Reason for visit: Follow up ED, BPH/urinary symptoms, penile adhesions  History: Significant phimosis with circumferential penile adhesions, takedown in OR with sedation in March 2023, has done well from that standpoint ED responsive to Cialis  as needed Urinary symptoms/nocturia, previously had side effects from Flomax  but was unable to clarify what these were  Physical Exam: BP 116/66 (BP Location: Left Arm, Patient Position: Sitting, Cuff Size: Normal)   Pulse 70   Ht 5' 11 (1.803 m)   Wt 147 lb (66.7 kg)   SpO2 98%   BMI 20.50 kg/m  Uncircumcised phallus, no significant adhesions, no erythema  Imaging/labs: PSA November 2022 normal at 2.86 PVR today normal at 23ml  Today: No concerns regarding any recurrence of penile adhesions Bothersome nocturia 2-3 times at night and weak stream, he is interested in trying Flomax  again Questions about PSA screening, reassurance provided regarding normal value a few years ago and that routine screening not recommended over age 7 Minimally sexually active at this point secondary to back pain for he and his wife  Plan:   Trial of Flomax , risk and benefits discussed Do not recommend repeat PSA screening based on his age and recent normal value If bothersome side effects from Flomax , could trial Cialis  5 mg daily RTC 1 year PVR   David JAYSON Burnet, MD  [facility] [address], [address] [place], [place] [address] [phone]

## 2024-06-09 NOTE — Patient Instructions (Signed)
 Understanding BPH (Benign Prostatic Hyperplasia)  What is BPH? BPH stands for Benign Prostatic Hyperplasia. It is a common condition that affects many men as they get older. It happens when the prostate gland, which is part of the male reproductive system, gets bigger. This can cause problems when urinating.  What Are the Symptoms?    Feeling like you need to urinate often, especially at night Trouble starting urinating or weak stream Sudden urge to urinate Feeling that your bladder has not emptied completely  Why Does It Happen? As men age, the prostate naturally gets bigger. This can press against the tube that carries urine out of the bladder, making it hard to urinate.  How Is BPH Treated? There are different ways to treat BPH, depending on how severe the symptoms are:  Lifestyle Changes    Reduce drinks before bedtime Avoid diet/flavored drinks, caffeine, and alcohol Practice double voiding (urinating twice during a trip to the bathroom)  Medications    Alpha-blockers (like tamsulosin /flomax ) to relax the muscles in the prostate and bladder neck 5-alpha reductase inhibitors (like finasteride) to shrink the prostate  If your symptoms do not respond to medications, you have side effect from medications, or would just like to get off of medications, there are multiple surgical options to improve urination.  Procedures and Surgery(Urolift and HoLEP)    UroLift: ~15-minute outpatient procedure performed in the operating room where the prostate is pinned open with small clips.  Patient's discharge the same day, rarely needed catheter, no risk of urinary leakage or erection problems.  Very low risk of bleeding or infection.  It is common to have some temporary irritative symptoms for 1 to 2 weeks after surgery including burning with urination, urgency, frequency.  This is a good option for small prostate or patients with mild to moderate symptoms, but may not be the best long-term  treatment if you have a very large prostate, have urinary retention requiring a catheter, or have severe symptoms  HoLEP(holmium laser enucleation of the prostate): ~1 hour outpatient procedure performed in the operating room where a laser is used to hollow out a large channel through the prostate.  The procedure was performed through the urethra, so there are no cuts or incisions.  You will need a catheter for 2 days afterwards.  Low risk of bleeding, infection, or damage to the bladder or ureters.  This is the gold standard BPH procedure.  Common to have urinary urgency, frequency, and leakage temporarily afterwards, but <2% risk of long-term incontinence.  This is the best treatment if you have a very large prostate, retention requiring a Foley catheter, or have severe symptoms.   Nocturia refers to the need to wake up during the night to urinate, which can disrupt your sleep and impact your overall well-being. Fortunately, there are several strategies you can employ to help prevent or manage nocturia. It's important to consult with your healthcare provider before making any significant changes to your routine. Here are some helpful strategies to consider:  Limit Fluid Intake Before Bed: Avoid drinking large amounts of fluids in the evening, especially within a few hours of bedtime. Consume most of your daily fluid intake earlier in the day to reduce the need to urinate at night.  Monitor Your Diet: Limit your intake of caffeine and alcohol, as these substances can increase urine production and irritate the bladder.  Avoid diet, zero calorie, and artificially sweetened drinks, especially sodas, in the afternoon or evening. Be mindful of consuming foods  and drinks with high water content before bedtime, such as watermelon and herbal teas.  Time Your Medications: If you're taking medications that contribute to increased urination, consult your healthcare provider about adjusting the timing of  these medications to minimize their impact during the night.  Practice Double Voiding: Before going to bed, make an effort to empty your bladder twice within a short period. This can help reduce the amount of urine left in your bladder before sleep.  Bladder Training: Gradually increase the time between bathroom visits during the day to train your bladder to hold larger volumes of urine. Over time, this can help reduce the frequency of nighttime awakenings to urinate.  Elevate Your Legs During the Day: Elevating your legs during the day can help minimize fluid retention in your lower extremities, which might reduce nighttime urination.  Pelvic Floor Exercises: Strengthening your pelvic floor muscles through Kegel exercises can help improve bladder control and potentially reduce the urge to urinate at night.  Create a Relaxing Bedtime Routine: Stress and anxiety can exacerbate nocturia. Engage in calming activities before bed, such as reading, listening to soothing music, or practicing relaxation techniques.  Stay Active: Engage in regular physical activity, but avoid intense exercise close to bedtime, as this can increase your body's demand for fluids.  Maintain a Healthy Weight: Excess weight can compress the bladder and contribute to bladder and urinary issues. Aim to achieve and maintain a healthy weight through a balanced diet and regular exercise.  Remember that every individual is unique, and the effectiveness of these strategies may vary. It's important to work with your healthcare provider to develop a plan that suits your specific needs and addresses any underlying causes of nocturia.

## 2024-09-15 ENCOUNTER — Other Ambulatory Visit: Payer: Self-pay | Admitting: Physical Medicine and Rehabilitation

## 2024-09-15 DIAGNOSIS — M5416 Radiculopathy, lumbar region: Secondary | ICD-10-CM

## 2024-09-15 DIAGNOSIS — M48062 Spinal stenosis, lumbar region with neurogenic claudication: Secondary | ICD-10-CM

## 2024-09-23 ENCOUNTER — Inpatient Hospital Stay
Admission: RE | Admit: 2024-09-23 | Discharge: 2024-09-23 | Attending: Physical Medicine and Rehabilitation | Admitting: Physical Medicine and Rehabilitation

## 2024-09-23 DIAGNOSIS — M48062 Spinal stenosis, lumbar region with neurogenic claudication: Secondary | ICD-10-CM

## 2024-09-23 DIAGNOSIS — M5416 Radiculopathy, lumbar region: Secondary | ICD-10-CM

## 2024-10-02 ENCOUNTER — Other Ambulatory Visit: Payer: Self-pay | Admitting: Urology

## 2024-10-02 DIAGNOSIS — N138 Other obstructive and reflux uropathy: Secondary | ICD-10-CM

## 2024-11-15 ENCOUNTER — Ambulatory Visit: Payer: Self-pay | Admitting: Surgery

## 2024-11-15 NOTE — H&P (Signed)
 Subjective:   CC: Non-recurrent unilateral inguinal hernia without obstruction or gangrene [K40.90]  HPI: referred by Layman Tanda Crisp* for evaluation of above.   History of Present Illness David [name] is a 79 year old male who presents with a progressively enlarging and painful left inguinal hernia.  He first noticed a left groin bulge 3 months ago after heavy lifting. Since then the bulge has gradually increased in size and pain.  The hernia reduces when he lies down and reappears with standing. He denies nausea, vomiting, abdominal distension, or inability to reduce the hernia. He has not had prior treatment for this hernia.   Past Medical History:  has a past medical history of Anemia, C. 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  Past Surgical History:  Past Surgical History:  Procedure Laterality Date   COLONOSCOPY  07/20/2006   Crohn's disease   ENDOSCOPIC CARPAL TUNNEL RELEASE Left 08/18/2021   by Dr. Kathlynn   CARDIAC CATHETERIZATION     CORONARY STENT PLACEMENT     Melanoma     Removed from left neck without nodes    Family History: family history includes Arthritis in his mother; Heart disease in his brother, brother, mother, and sister; Myocardial Infarction (Heart attack) in his father.  Social History:  reports that he has never smoked. He has never used smokeless tobacco. He reports that he does not drink alcohol and does not use drugs.  Current Medications: has a current medication list which includes the following prescription(s): acyclovir, aspirin ,  atorvastatin , blood glucose diagnostic, carvedilol , cholecalciferol  (vitamin d3), clopidogrel , multivit-min/folic acid/lutein, furosemide, gabapentin , glipizide , lactobacillus acidophilus, levothyroxine , omega-3 fatty acids-fish oil, omeprazole, potassium chloride , saw/vit e/sod sel/lyc/beta/pyg, tamsulosin , tramadol, vedolizumab , vitamins a,c,e-zinc -copper, and blood glucose meter.  Allergies:  Allergies as of 11/15/2024 - Reviewed 11/15/2024  Allergen Reaction Noted   Mesalamine  Nausea and Vomiting 12/01/2016    ROS:  A 15 point review of systems was performed and pertinent positives and negatives noted in HPI   Objective:     BP 121/66   Pulse 57   Ht 180.3 cm (5' 11)   Wt 66.7 kg (147 lb)   BMI 20.50 kg/m   Constitutional :  Alert, cooperative, no distress  Lymphatics/Throat:  Supple, no lymphadenopathy  Respiratory:  clear to auscultation bilaterally  Cardiovascular:  regular rate and rhythm  Gastrointestinal: soft, non-tender; bowel sounds normal; no masses,  no organomegaly. inguinal hernia noted.  large, reducible, no overlying skin changes, and left  Musculoskeletal: Steady gait and movement  Skin: Cool and moist  Psychiatric: Normal affect, non-agitated, not confused       LABS:  N/a   RADS: N/a Assessment:       Non-recurrent unilateral inguinal hernia without obstruction or gangrene [K40.90]  Plan:     1. Non-recurrent unilateral inguinal hernia without obstruction or gangrene [K40.90]   Discussed the risk of surgery including recurrence, which can be up to 50% in the case of incisional or complex hernias, possible use of prosthetic materials (mesh) and the increased risk of mesh infxn if used, bleeding, chronic pain, post-op infxn, post-op SBO or ileus, and possible re-operation to address said risks. The risks of general anesthetic, if used, includes MI, CVA, sudden death or even reaction to anesthetic medications also discussed.  Alternatives include  continued observation.  Benefits include possible symptom relief, prevention of incarceration, strangulation, enlargement in size over time, and the risk of emergency surgery in the face of strangulation.   Typical post-op recovery time of 3-5 days with 2 weeks of activity restrictions were also discussed.  ED return precautions given for sudden increase in pain, size of hernia with accompanying fever, nausea, and/or vomiting.  The patient verbalized understanding and all questions were answered to the patient's satisfaction.   2. Patient has elected to proceed with surgical treatment. Procedure will be scheduled.   left, Non-recurrent unilateral inguinal hernia without obstruction or gangrene [K40.90], robotic assisted laparoscopic repair with [id number]  labs/images/medications/previous chart entries reviewed personally and relevant changes/updates noted above.

## 2024-11-22 ENCOUNTER — Other Ambulatory Visit

## 2024-11-30 ENCOUNTER — Ambulatory Visit: Admit: 2024-11-30 | Admitting: Surgery

## 2025-06-12 ENCOUNTER — Ambulatory Visit: Admitting: Urology
